# Patient Record
Sex: Female | Born: 1940
Health system: Southern US, Community
[De-identification: ages and names within clinical notes are randomized; demographics above are authoritative.]

## PROBLEM LIST (undated history)

## (undated) DIAGNOSIS — E039 Hypothyroidism, unspecified: Secondary | ICD-10-CM

## (undated) DIAGNOSIS — M1990 Unspecified osteoarthritis, unspecified site: Secondary | ICD-10-CM

## (undated) DIAGNOSIS — I2510 Atherosclerotic heart disease of native coronary artery without angina pectoris: Secondary | ICD-10-CM

## (undated) DIAGNOSIS — E785 Hyperlipidemia, unspecified: Secondary | ICD-10-CM

## (undated) DIAGNOSIS — E119 Type 2 diabetes mellitus without complications: Secondary | ICD-10-CM

## (undated) DIAGNOSIS — K5790 Diverticulosis of intestine, part unspecified, without perforation or abscess without bleeding: Secondary | ICD-10-CM

## (undated) DIAGNOSIS — F3289 Other specified depressive episodes: Secondary | ICD-10-CM

## (undated) DIAGNOSIS — I10 Essential (primary) hypertension: Secondary | ICD-10-CM

## (undated) DIAGNOSIS — I471 Supraventricular tachycardia, unspecified: Secondary | ICD-10-CM

## (undated) DIAGNOSIS — F329 Major depressive disorder, single episode, unspecified: Secondary | ICD-10-CM

## (undated) HISTORY — DX: Essential (primary) hypertension: I10

## (undated) HISTORY — DX: Hyperlipidemia, unspecified: E78.5

## (undated) HISTORY — DX: Other specified depressive episodes: F32.89

## (undated) HISTORY — DX: Major depressive disorder, single episode, unspecified: F32.9

## (undated) HISTORY — DX: Diverticulosis of intestine, part unspecified, without perforation or abscess without bleeding: K57.90

## (undated) HISTORY — DX: Hypothyroidism, unspecified: E03.9

---

## 1987-11-13 HISTORY — PX: ABDOMINAL HYSTERECTOMY: SHX81

## 2000-02-12 ENCOUNTER — Ambulatory Visit (HOSPITAL_COMMUNITY): Admission: RE | Admit: 2000-02-12 | Discharge: 2000-02-12 | Payer: Self-pay | Admitting: Family Medicine

## 2000-02-12 ENCOUNTER — Encounter: Payer: Self-pay | Admitting: Family Medicine

## 2001-07-10 ENCOUNTER — Emergency Department (HOSPITAL_COMMUNITY): Admission: EM | Admit: 2001-07-10 | Discharge: 2001-07-10 | Payer: Self-pay | Admitting: Internal Medicine

## 2001-11-12 HISTORY — PX: COLONOSCOPY: SHX174

## 2002-04-24 ENCOUNTER — Ambulatory Visit (HOSPITAL_COMMUNITY): Admission: RE | Admit: 2002-04-24 | Discharge: 2002-04-24 | Payer: Self-pay | Admitting: *Deleted

## 2004-09-18 ENCOUNTER — Ambulatory Visit: Payer: Self-pay | Admitting: Cardiology

## 2004-09-20 ENCOUNTER — Ambulatory Visit: Payer: Self-pay | Admitting: Internal Medicine

## 2005-06-12 HISTORY — PX: TOTAL KNEE ARTHROPLASTY: SHX125

## 2005-06-27 ENCOUNTER — Inpatient Hospital Stay (HOSPITAL_COMMUNITY): Admission: RE | Admit: 2005-06-27 | Discharge: 2005-07-02 | Payer: Self-pay | Admitting: Orthopedic Surgery

## 2005-07-17 ENCOUNTER — Ambulatory Visit: Payer: Self-pay | Admitting: Internal Medicine

## 2005-10-18 ENCOUNTER — Ambulatory Visit: Payer: Self-pay | Admitting: Internal Medicine

## 2005-10-26 ENCOUNTER — Ambulatory Visit: Payer: Self-pay | Admitting: Internal Medicine

## 2005-11-23 ENCOUNTER — Encounter: Admission: RE | Admit: 2005-11-23 | Discharge: 2006-02-21 | Payer: Self-pay | Admitting: Internal Medicine

## 2006-09-20 ENCOUNTER — Ambulatory Visit: Payer: Self-pay | Admitting: Internal Medicine

## 2006-09-20 LAB — CONVERTED CEMR LAB
ALT: 22 units/L (ref 0–40)
AST: 22 units/L (ref 0–37)
BUN: 9 mg/dL (ref 6–23)
Chol/HDL Ratio, serum: 8.4
Cholesterol: 235 mg/dL (ref 0–200)
Creatinine, Ser: 0.8 mg/dL (ref 0.4–1.2)
Creatinine,U: 122.3 mg/dL
Free T4: 0.7 ng/dL — ABNORMAL LOW (ref 0.9–1.8)
HDL: 28 mg/dL — ABNORMAL LOW (ref >39.0)
Hgb A1c MFr Bld: 9.1 % — ABNORMAL HIGH (ref 4.6–6.0)
LDL DIRECT: 165.5 mg/dL
Microalb Creat Ratio: 4.9 mg/g (ref 0.0–30.0)
Microalb, Ur: 0.6 mg/dL (ref 0.0–1.9)
Potassium: 3.9 meq/L (ref 3.5–5.1)
TSH: 3.83 microintl units/mL (ref 0.35–5.50)
Triglyceride fasting, serum: 224 mg/dL (ref 0–149)
VLDL: 45 mg/dL — ABNORMAL HIGH (ref 0–40)

## 2006-09-27 ENCOUNTER — Ambulatory Visit: Payer: Self-pay | Admitting: Internal Medicine

## 2007-04-08 ENCOUNTER — Encounter: Payer: Self-pay | Admitting: Internal Medicine

## 2007-10-08 ENCOUNTER — Ambulatory Visit: Payer: Self-pay | Admitting: Internal Medicine

## 2007-10-10 ENCOUNTER — Ambulatory Visit: Payer: Self-pay | Admitting: Internal Medicine

## 2007-10-12 LAB — CONVERTED CEMR LAB
ALT: 34 units/L (ref 0–35)
AST: 34 units/L (ref 0–37)
BUN: 9 mg/dL (ref 6–23)
Creatinine, Ser: 0.7 mg/dL (ref 0.4–1.2)
Creatinine,U: 78.3 mg/dL
Hgb A1c MFr Bld: 8.3 % — ABNORMAL HIGH (ref 4.6–6.0)
Microalb Creat Ratio: 14 mg/g (ref 0.0–30.0)
Microalb, Ur: 1.1 mg/dL (ref 0.0–1.9)
Potassium: 4.7 meq/L (ref 3.5–5.1)

## 2007-10-13 ENCOUNTER — Encounter (INDEPENDENT_AMBULATORY_CARE_PROVIDER_SITE_OTHER): Payer: Self-pay | Admitting: *Deleted

## 2007-10-20 ENCOUNTER — Encounter (INDEPENDENT_AMBULATORY_CARE_PROVIDER_SITE_OTHER): Payer: Self-pay | Admitting: *Deleted

## 2007-10-24 ENCOUNTER — Ambulatory Visit: Payer: Self-pay | Admitting: Internal Medicine

## 2007-10-24 DIAGNOSIS — F3289 Other specified depressive episodes: Secondary | ICD-10-CM | POA: Insufficient documentation

## 2007-10-24 DIAGNOSIS — E039 Hypothyroidism, unspecified: Secondary | ICD-10-CM | POA: Insufficient documentation

## 2007-10-24 DIAGNOSIS — F329 Major depressive disorder, single episode, unspecified: Secondary | ICD-10-CM

## 2007-10-24 LAB — CONVERTED CEMR LAB
Cholesterol, target level: 200 mg/dL
HDL goal, serum: 40 mg/dL
LDL Goal: 100 mg/dL

## 2008-01-02 ENCOUNTER — Ambulatory Visit: Payer: Self-pay | Admitting: Internal Medicine

## 2008-01-02 LAB — CONVERTED CEMR LAB
AST: 21 units/L (ref 0–37)
Bilirubin, Direct: 0.1 mg/dL (ref 0.0–0.3)
CO2: 33 meq/L — ABNORMAL HIGH (ref 19–32)
Chloride: 109 meq/L (ref 96–112)
Cholesterol: 156 mg/dL (ref 0–200)
Creatinine, Ser: 0.8 mg/dL (ref 0.4–1.2)
Glucose, Bld: 124 mg/dL — ABNORMAL HIGH (ref 70–99)
HDL: 37.1 mg/dL — ABNORMAL LOW (ref >39.0)
Hgb A1c MFr Bld: 7.3 % — ABNORMAL HIGH (ref 4.6–6.0)
LDL Cholesterol: 90 mg/dL (ref 0–99)
Sodium: 146 meq/L — ABNORMAL HIGH (ref 135–145)
Total Bilirubin: 0.7 mg/dL (ref 0.3–1.2)
Total Protein: 6.8 g/dL (ref 6.0–8.3)

## 2008-01-09 ENCOUNTER — Ambulatory Visit: Payer: Self-pay | Admitting: Internal Medicine

## 2008-01-09 DIAGNOSIS — E785 Hyperlipidemia, unspecified: Secondary | ICD-10-CM | POA: Insufficient documentation

## 2008-01-09 DIAGNOSIS — I10 Essential (primary) hypertension: Secondary | ICD-10-CM | POA: Insufficient documentation

## 2008-05-07 ENCOUNTER — Ambulatory Visit: Payer: Self-pay | Admitting: Internal Medicine

## 2008-05-17 ENCOUNTER — Encounter (INDEPENDENT_AMBULATORY_CARE_PROVIDER_SITE_OTHER): Payer: Self-pay | Admitting: *Deleted

## 2008-05-17 LAB — CONVERTED CEMR LAB
Creatinine,U: 126 mg/dL
Hgb A1c MFr Bld: 8 % — ABNORMAL HIGH (ref 4.6–6.0)
Microalb, Ur: 0.2 mg/dL (ref 0.0–1.9)
Potassium: 4.3 meq/L (ref 3.5–5.1)
Total CHOL/HDL Ratio: 6.9

## 2008-05-28 ENCOUNTER — Telehealth: Payer: Self-pay | Admitting: Internal Medicine

## 2008-05-28 ENCOUNTER — Ambulatory Visit: Payer: Self-pay | Admitting: Internal Medicine

## 2008-06-02 ENCOUNTER — Encounter: Payer: Self-pay | Admitting: Internal Medicine

## 2008-06-07 ENCOUNTER — Encounter (INDEPENDENT_AMBULATORY_CARE_PROVIDER_SITE_OTHER): Payer: Self-pay | Admitting: *Deleted

## 2008-06-18 ENCOUNTER — Encounter: Payer: Self-pay | Admitting: Internal Medicine

## 2008-08-05 ENCOUNTER — Telehealth (INDEPENDENT_AMBULATORY_CARE_PROVIDER_SITE_OTHER): Payer: Self-pay | Admitting: *Deleted

## 2008-08-13 ENCOUNTER — Ambulatory Visit: Payer: Self-pay | Admitting: Internal Medicine

## 2008-08-13 LAB — CONVERTED CEMR LAB
Albumin: 3.5 g/dL (ref 3.5–5.2)
Alkaline Phosphatase: 60 units/L (ref 39–117)
Bilirubin, Direct: 0.1 mg/dL (ref 0.0–0.3)
Cholesterol: 247 mg/dL (ref 0–200)
Direct LDL: 144.5 mg/dL
HDL: 32.8 mg/dL — ABNORMAL LOW (ref >39.0)
TSH: 3.59 microintl units/mL (ref 0.35–5.50)
Total CHOL/HDL Ratio: 7.5
VLDL: 64 mg/dL — ABNORMAL HIGH (ref 0–40)

## 2008-08-20 ENCOUNTER — Ambulatory Visit: Payer: Self-pay | Admitting: Internal Medicine

## 2008-08-20 DIAGNOSIS — J209 Acute bronchitis, unspecified: Secondary | ICD-10-CM | POA: Insufficient documentation

## 2008-09-07 ENCOUNTER — Encounter: Payer: Self-pay | Admitting: Internal Medicine

## 2008-09-18 ENCOUNTER — Emergency Department (HOSPITAL_BASED_OUTPATIENT_CLINIC_OR_DEPARTMENT_OTHER): Admission: EM | Admit: 2008-09-18 | Discharge: 2008-09-18 | Payer: Self-pay | Admitting: Emergency Medicine

## 2008-10-15 ENCOUNTER — Encounter: Payer: Self-pay | Admitting: Internal Medicine

## 2010-10-10 ENCOUNTER — Ambulatory Visit: Payer: Self-pay | Admitting: Internal Medicine

## 2010-10-10 LAB — CONVERTED CEMR LAB
Glucose, Urine, Semiquant: NEGATIVE
Nitrite: POSITIVE
Specific Gravity, Urine: <1.005
pH: 6.5

## 2010-10-11 ENCOUNTER — Ambulatory Visit: Payer: Self-pay | Admitting: Internal Medicine

## 2010-10-11 DIAGNOSIS — E119 Type 2 diabetes mellitus without complications: Secondary | ICD-10-CM | POA: Insufficient documentation

## 2010-10-11 DIAGNOSIS — N390 Urinary tract infection, site not specified: Secondary | ICD-10-CM | POA: Insufficient documentation

## 2010-12-12 NOTE — Assessment & Plan Note (Signed)
Summary: UTI/KN   Vital Signs:  Patient profile:   70 year old female Height:      64 inches (162.56 cm) Weight:      199.25 pounds (90.57 kg) BMI:     34.32 Temp:     98.4 degrees F (36.89 degrees C) oral Resp:     16 per minute BP sitting:   130 / 70  (left arm) Cuff size:   large  Vitals Entered By: Lucious Groves CMA (October 11, 2010 12:05 PM) CC: UTI./kb, Dysuria Is Patient Diabetic? Yes Pain Assessment Patient in pain? no      Comments Patient notes that she has been having fever, back pain, blood in urine, dysuria, burning, increased frequency and increased urgency.   CC:  UTI./kb and Dysuria.  History of Present Illness: Dysuria      This is a 70 year old woman who presents with Dysuria ; onset as pressure  11/25.  The patient reports burning with urination, urinary frequency, urgency, and hematuria.  Associated symptoms include fever to 101 on 11/28  and R  flank pain.  The patient denies the following associated symptoms: nausea, vomiting, shaking chills, and abdominal pain.  Risk factors for urinary tract infection include diabetes.  Pre illness , A1c 6-7 %; fasting glucoses 69- 130.She is on Lantus 50-60 units once daily & Metformin.  Current Medications (verified): 1)  Glimepiride 4 Mg  Tabs (Glimepiride) .... Take One Half  Tablet Two Times A Day With Meals 2)  Centrum Silver .... Once Daily 3)  Asa 81mg  .... Once Daily 4)  Losartan Potassium 100 Mg Tabs (Losartan Potassium) .Marland Kitchen.. 1 By Mouth Qd 5)  Levothyroxine Sodium 50 Mcg Tabs (Levothyroxine Sodium) .Marland Kitchen.. 1 By Mouth Qd 6)  Metformin Hcl 500 Mg Tabs (Metformin Hcl) .Marland Kitchen.. 1 By Mouth Two Times A Day  Allergies (verified): 1)  ! Pcn  Physical Exam  General:  well-nourished,in no acute distress; alert,appropriate and cooperative throughout examination Lungs:  Normal respiratory effort, chest expands symmetrically. Lungs are clear to auscultation, no crackles or wheezes. Heart:  Normal rate and regular rhythm. S1  and S2 normal without gallop, murmur, click, rub . S4 Abdomen:  Bowel sounds positive but slightly decreased,abdomen soft and non-tender without masses, organomegaly or hernias noted. Pulses:  R and L carotid,radial,dorsalis pedis and posterior tibial pulses are full and equal bilaterally Extremities:  No clubbing, cyanosis, edema. Neg SLR bilaterally Neurologic:  alert & oriented X3.   Skin:  Intact without suspicious lesions or rashes. Skin slightly damp Cervical Nodes:  No lymphadenopathy noted Axillary Nodes:  No palpable lymphadenopathy Psych:  memory intact for recent and remote, normally interactive, and good eye contact.     Impression & Recommendations:  Problem # 1:  UTI (ICD-599.0)  The following medications were removed from the medication list:    Azithromycin 250 Mg Tabs (Azithromycin) .Marland Kitchen... As per pack Her updated medication list for this problem includes:    Ciprofloxacin Hcl 500 Mg Tabs (Ciprofloxacin hcl) .Marland Kitchen... 1 two times a day ( hold glimiperide while on this)  Orders: Prescription Created Electronically [phone])  Problem # 2:  DIABETES MELLITUS, TYPE II, CONTROLLED (ICD-250.00) as per Dr Talmage Nap The following medications were removed from the medication list:    Actoplus Met 15-850 Mg Tabs (Pioglitazone hcl-metformin hcl) .Marland Kitchen... Take one tablet with two largest meals of the day.    Diovan 160 Mg Tabs (Valsartan) .Marland Kitchen... 1 by mouth once daily Her updated medication list for this problem  includes:    Glimepiride 4 Mg Tabs (Glimepiride) .Marland Kitchen... Take one half  tablet two times a day with meals    Losartan Potassium 100 Mg Tabs (Losartan potassium) .Marland Kitchen... 1 by mouth qd    Metformin Hcl 500 Mg Tabs (Metformin hcl) .Marland Kitchen... 1 by mouth two times a day  Complete Medication List: 1)  Glimepiride 4 Mg Tabs (Glimepiride) .... Take one half  tablet two times a day with meals 2)  Centrum Silver  .... Once daily 3)  Asa 81mg   .... Once daily 4)  Losartan Potassium 100 Mg Tabs (Losartan  potassium) .Marland Kitchen.. 1 by mouth qd 5)  Levothyroxine Sodium 50 Mcg Tabs (Levothyroxine sodium) .Marland Kitchen.. 1 by mouth qd 6)  Metformin Hcl 500 Mg Tabs (Metformin hcl) .Marland Kitchen.. 1 by mouth two times a day 7)  Ciprofloxacin Hcl 500 Mg Tabs (Ciprofloxacin hcl) .Marland Kitchen.. 1 two times a day ( hold glimiperide while on this)  Patient Instructions: 1)  Drink as much NON dairy fluid as you can tolerate for the next few days. Monitor FBS & 2 hrs after largest meal until well. 2)  Take 650-1000mg  of Tylenol every 4-6 hours as needed for relief of pain or comfort of fever AVOID taking more than 4000mg   in a 24 hour period (can cause liver damage in higher doses) OR take 400-600mg  of Ibuprofen (Advil, Motrin) with food every 4-6 hours as needed for relief of pain or comfort of fever. Prescriptions: CIPROFLOXACIN HCL 500 MG TABS (CIPROFLOXACIN HCL) 1 two times a day ( hold Glimiperide while on this)  #14 x 0   Entered and Authorized by:   Marga Melnick MD   Signed by:   Marga Melnick MD on 10/11/2010   Method used:   Faxed to ...       [facility] [address] # [phone]* (retail)       [address] [place], [place]  [address]       Ph: [phone]       Fax: [phone]   RxID:   [id number]    Orders Added: 1)  Est. Patient Level III [24401] 2)  Prescription Created Electronically [phone]

## 2011-03-30 NOTE — Op Note (Signed)
Danielle Murray, Danielle Murray               ACCOUNT NO.:  [id number]   MEDICAL RECORD NO.:  [id number]          PATIENT TYPE:  INP   LOCATION:  X004                         FACILITY:  [facility]   PHYSICIAN:  Georges Lynch. Gioffre, M.D.DATE OF BIRTH:  [birth year]   DATE OF PROCEDURE:  06/27/2005  DATE OF DISCHARGE:                                 OPERATIVE REPORT   SURGEON:  Georges Lynch. Darrelyn Hillock, M.D.   ASSISTANT:  Ebbie Ridge. Paitsel, P.A.   PREOPERATIVE DIAGNOSIS:  Severe degenerative arthritis left knee with a genu  varus.   POSTOPERATIVE DIAGNOSIS:  Severe degenerative arthritis left knee with a  genu varus.   OPERATION:  Left total knee arthroplasty utilizing the DePuy system. I  utilized the Air cabin crew. The sizes used were a size 32 patella  with three pegs, a size 2 tibial tray with a 12.5 mm thickness insert and a  size 2.5 left femoral component, posterior cruciate sacrificing type. We did  utilize a rotating platform tibial component. We cemented all three  components in and I used vancomycin in the cement.   DESCRIPTION OF PROCEDURE:  Under general anesthesia, routine orthopedic prep  and draping of the left lower extremity was carried out. The patient had 1  gram of vancomycin IV. After a sterile prep and draping of the left lower  extremity carried out, I then went on and exsanguinated the leg with the  Esmarch. The leg was exsanguinated and esmarched, tourniquet was elevated to  375 mmHg. An anterior approach to the left knee was carried out, bleeders  identified and cauterized. Self-retaining retractors were inserted. I then  carried out a median parapatellar incision, I reflected the patella  laterally, flexed the knee and did lateral medial meniscectomies and excised  the anterior and posterior cruciate ligaments. I freed up the medial  structures of the knee because of the severe genu varus. We then went on and  removed the spurs from the patella, tibia and femur. An  initial drill hole  was made in the intercondylar notch, the intramedullary guide rod was  inserted and we measured and removed 10 mm thickness off the distal femur at  5 degree valgus angle. A #2 jig was inserted for a size 2.5 femur after we  did the appropriate measurements and we carried out our anterior, posterior  and chamfering cuts in the usual fashion. Following that, we then prepared  our tibia in the usual fashion. We removed 6 mm thickness off the affected  medial side of the tibia. Once the tibia was cut, we then continued our  preparation of the tibia for a size two tibial tray. We cut our keel cut in  the usual fashion. We then went on and prepared our notch cut from the femur  in the usual fashion. Following this, we inserted our trial components. We  first tried a 10 mm thickness tibial insert, it was too loose. We then tried  a 12.5 mm thickness tibial insert and we had excellent flexion, extension  and good medial lateral stability. We did utilize the spacer block first to  measure our tension. Following that, we went on and prepared our patella. We  removed the appropriate amount of bone from the articular surface of patella  by carrying out a resurfacing type patella, three drill holes were made for  a 32 mm patella. After this was done, we then thoroughly water picked out  the knee after we removed the trial components, dried the knee out and  cemented all three components in simultaneously. Vancomycin was used in the  cement. Following this, we then made sure that we had no loose pieces of  cement. We thoroughly removed all loose pieces of cement. We then inserted  our permanent 12.5 thickness size  two tibial rotating platform tray, took the knee through motion and had  excellent stability. I carried out a partial lateral release at the same  time. I then inserted the hemovac drain and closed the wound in layers in  the usual fashion. The surgeon was Dr. Darrelyn Hillock,  assistant Terie Purser, P.A.           ______________________________  Georges Lynch. Darrelyn Hillock, M.D.     RAG/MEDQ  D:  06/27/2005  T:  06/28/2005  Job:  [id number]

## 2011-03-30 NOTE — H&P (Signed)
NAME:  Danielle Murray, Danielle Murray               ACCOUNT NO.:  [id number]   MEDICAL RECORD NO.:  [id number]          PATIENT TYPE:  INP   LOCATION:  NA                           FACILITY:  [facility]   PHYSICIAN:  Georges Lynch. Gioffre, M.D.DATE OF BIRTH:  Jul 15, 1941   DATE OF ADMISSION:  DATE OF DISCHARGE:                                HISTORY & PHYSICAL   HISTORY:  The patient has had left knee pain for several months, has gotten  worse over the past few weeks.  He has had increasing pain with ambulation.  It is difficult for her to climb stairs.  She is not comfortable taking  nonsteroidal anti-inflammatory medications because her mother had a problem  with heart failure while taking Vioxx.  She knows she has arthritis in her  knees, and she is ready to proceed with a total knee arthroplasty and will  be admitted to hospital for same.   ALLERGIES:  PENICILLIN causes swelling.   PRIMARY CARE PHYSICIAN:  Dr. Alwyn Ren.   PAST MEDICAL HISTORY:  1.  Anxiety.  2.  Depression.  3.  Pneumonia in 1983.  4.  History of previous UTI.  5.  Hypertension.  6.  Diverticulitis.  7.  Noninsulin-dependent diabetes.  8.  Degenerative arthritis.   CURRENT MEDICATIONS:  1.  Aspirin 81 mg daily.  2.  Amaryl 4 mg daily.  3.  Micardis HCT 80/12.5 mg daily.   PREVIOUS SURGICAL HISTORY:  The patient has had hysterectomy 1989.   FAMILY HISTORY:  Mother, sister, and brother heart disease, hypertension.  Mother, father, brother, sister with diabetes.  Father with a stroke.  Mother with arthritis.   REVIEW OF SYSTEMS:  GENERAL:  Denies weight change, fever, chills, fatigue.  HEENT:  Denies headache, visual changes, tinnitus, hearing loss, sore  throat.  CARDIOVASCULAR:  Denies chest pain, palpitations, shortness of  breath, orthopnea.  PULMONARY:  Denies dyspnea, wheezing, cough, sputum  production, hemoptysis.  GI:  Denies nausea, vomiting, hematemesis, or  abdominal pain.  GU:  Denies dysuria, frequency, urgency,  hematuria.  ENDOCRINE:  Denies polyuria, polydipsia, appetite change, heat or cold  intolerance.  MUSCULOSKELETAL:  The patient has bilateral knee pain.  NEUROLOGIC:  Denies dizziness, vertigo, syncope, seizure.  SKIN:  Denies  itching rashes, masses, or moles.   PHYSICAL EXAMINATION:  VITAL SIGNS:  Temperature is 98.7, pulse is 72,  respirations 16, blood pressure 122/70.  GENERAL:  A 70 year old female in no acute distress.  HEENT:  PERRL.  EOM's intact.  Throat is clear.  NECK:  Supple.  CHEST:  Clear to auscultation bilaterally.  No wheezing, rales, or rhonchi  noted.  HEART:  Regular rate and rhythm without murmur.  ABDOMEN:  Positive bowel sounds, soft, nontender.  EXAMINATION OF LEFT KNEE:  She flexes to about 90 degrees.  She has about 5  degree extension lag.  She has moderate knee effusion with genu varus  deformity.  SKIN:  Warm and dry.   X-ray of her left knee shows complete collapse of the medial joint.   IMPRESSION:  Degenerative osteoarthritis, left knee.   PLAN:  The patient is to be admitted to [facility], June 27, [facility], to undergo a left total knee arthroplasty.      Ebbie Ridge. Paitsel, P.A.    ______________________________  Georges Lynch Darrelyn Hillock, M.D.    Tilden Dome  D:  06/25/2005  T:  06/25/2005  Job:  [id number]

## 2011-03-30 NOTE — Discharge Summary (Signed)
NAMETAGEN, BRETHAUER               ACCOUNT NO.:  [id number]   MEDICAL RECORD NO.:  [id number]          PATIENT TYPE:  INP   LOCATION:  1510                         FACILITY:  [facility]   PHYSICIAN:  Georges Lynch. Gioffre, M.D.DATE OF BIRTH:  27-Nov-1940   DATE OF ADMISSION:  06/27/2005  DATE OF DISCHARGE:  07/02/2005                                 DISCHARGE SUMMARY   ADMISSION DIAGNOSIS:  Degenerative arthritis of the left knee.   DISCHARGE DIAGNOSIS:  Degenerative arthritis of the left knee.   REASON FOR ADMISSION:  Admitted for a total knee arthroplasty.   HOSPITAL COURSE:  She had severe arthritis involving the left knee.  She was  treated conservatively in the office and finally decided to have a left  total knee.  She was taken to surgery on June 27, 2005, and had a left  total knee arthroplasty utilizing the DePuy system.  I utilized the rotating  platform.  All three components were cemented and vancomycin was used in the  cement.   Postop, she did well.  As far as her hospital course she did well.  She was  started on the knee machine 1 day postop.  She was also maintained on  Coumadin.  On June 28, 2005, her Hemovac was discontinued and hemoglobin  was 11.9.  She moved her foot well and there was no problem as far as any  complications neurovascular wise.   On June 29, 2005, hemoglobin was 11.0.  Her dressing was changed and the  wound looked good.  She was up ambulating full weightbearing with a walker  and therapy and doing well.  She did well during her hospital course.  On  July 02, 2005, the dressing was changed again and the wound looked fine  and it was elected to discharge her on July 02, 2005.   LABORATORY DATA AND X-RAY FINDINGS:  Last hemoglobin that I recorded was  11.0.  On admission her sodium was 139, potassium 4.3, chloride 100, glucose  141.  She is a diabetic.  Her BUN was 15, which is normal, creatinine 0.7,  which was normal.  Her white count on  admission was 6700, hemoglobin 14.2,  hematocrit 42, differential normal.  Her INR was maintained while in  hospital with appropriate levels by the pharmacist.  Her urinalysis was  basically negative.   Chest x-ray showed no active pulmonary disease.  Her knee x-ray postop  showed that prosthesis was in good alignment.   DISCHARGE MEDICATIONS:  1.  Coumadin 5 mg a day.  2.  Percocet 10/650 one every 4 hours p.r.n. pain.  3.  Robaxin 500 mg by mouth t.i.d. p.r.n. for pain.  4.  Amaryl 4 mg a day.  5.  Micardis HCT 80/12.5 mg a day.   WOUND CARE:  She will change her dressing on a daily basis.   SPECIAL INSTRUCTIONS:  She will have home therapy as well as having her INR  monitored since she will be on Coumadin x4 weeks.   ACTIVITY:  She will be full weightbearing with a walker.   FOLLOW UP:  She will be seen in the office in about 2 weeks from the date of  surgery.           ______________________________  Georges Lynch Darrelyn Hillock, M.D.     RAG/MEDQ  D:  07/19/2005  T:  07/19/2005  Job:  [id number]

## 2011-03-30 NOTE — Procedures (Signed)
[facility]  Patient:    Danielle Murray, Danielle Murray Visit Number: [id number] MRN: [id number]          Service Type: END Location: ENDO Attending Physician:  Sabino Gasser Dictated by:   Sabino Gasser, M.D. Proc. Date: 04/24/02 Admit Date:  04/24/2002   CC:         Tinnie Gens C. Quintella Reichert, M.D.   Procedure Report  PROCEDURE:  Colonoscopy.  INDICATION:  Rectal bleeding.  PREP:  Visicol tablets.  ANESTHESIA:  Demerol 80, Versed 6 mg.  DESCRIPTION OF PROCEDURE:  With the patient mildly sedated in the left lateral decubitus position, the Olympus videoscopic colonoscope was inserted into the rectum and passed under direct vision to the cecum, identified by the ileocecal valve and appendiceal orifice.  From this point, the colonoscope was slowly withdrawn, taking circumferential views of the entire colonic mucosa, stopping in the sigmoid colon to photograph a moderate amount of diverticulosis until we reached rectum which appeared normal on direct and retroflexed view.  The endoscope was straightened and withdrawn through the anal canal.  The patient had large external hemorrhoids on rectal exam.  The patients vital signs and pulse oximeter remained stable.  The patient tolerated the procedure well without apparent complications.  FINDINGS: 1. External hemorrhoids, fairly large, which is probably the cause of the    patients bleeding. 2. Diverticulosis of the sigmoid colon. 3. Otherwise an unremarkable examination.  PLAN:  Follow up with Dr. Quintella Reichert for medical care and with me as needed. Dictated by:   Sabino Gasser, M.D. Attending Physician:  Sabino Gasser DD:  04/24/02 TD:  04/25/02 Job: [id number]

## 2012-01-15 ENCOUNTER — Encounter: Payer: Self-pay | Admitting: Dietician

## 2012-01-15 ENCOUNTER — Encounter: Payer: Medicare Other | Attending: Endocrinology | Admitting: Dietician

## 2012-01-15 VITALS — Ht 64.0 in | Wt 190.6 lb

## 2012-01-15 DIAGNOSIS — E119 Type 2 diabetes mellitus without complications: Secondary | ICD-10-CM | POA: Insufficient documentation

## 2012-01-15 DIAGNOSIS — Z713 Dietary counseling and surveillance: Secondary | ICD-10-CM | POA: Insufficient documentation

## 2012-01-15 NOTE — Progress Notes (Signed)
  Medical Nutrition Therapy:  Appt start time: 1000 end time:  1100.  Assessment:  Primary concerns today: Getting better control of blood glucose and to loose some weight.  Has stopped loosing at 190 lb.  History of type 2 diabetes for 2-3 years.  [facility] RD [facility] on [place].  Has been working on weight loss over the last few months.  Her A1C usually is 7-8.0%.  This last time it had increased to 9.1%.  Her weight was at 210 lb and she has "stalled at 190 lbs."    MEDICATIONS: Diabetes medications include:Metformin, Lantus insulin, and Humalog insulin  DIETARY INTAKE:  24-hr recall:  B (7:30 AM): 1 egg, canadian bacon and 1 whole wheat toast, Or PB or cheese slice on the1  whole grain bread, OR fruit smoothie (yogurt, greek smoothie from [employer] add 2% 1/2 cup of milk) Or cereal with 2% milk  OR vitamuffin top (various flavors) small v8 juice Snk (mid AM) :80 calorie yogurt  L (1:00 PM): Salad greens, tomato onion, cuke, olives celery and dressing +/- cheese on sandwich, OR Spinach salad with tomato and feta cheese.or sandwich ([place] or pimento cheese)) diet coke or water or tea with Splenda Snk (mid PM): Jomel Whittlesey be an apple split with spouse or handful or yogurt raisins, not every day. D (6-6:30 PM): trukey burger, 1/2 cup rice with vegetables (peas and carrots)  Coffee with 2% milk Snk (9:00 PM): Sugar free banana bread.  And coffee. And maybe 4 PR crackers. Beverages: Tea with Splenda, water, coffee with cream, V8 juice.    Recent physical activity: during winter months, no planned exercise, Ophia Shamoon be some stretches in the recliner and in spring/summe/fall will walk outside.  Estimated energy needs: HT:64 in  WT: 190.6  BMI: 32.8%  ADJ WT: 143 lb (65 kg) 1300-1400 calories 140-145 g carbohydrates 95-100 g protein 34-36 g fat  Progress Towards Goal(s):  In progress.   Nutritional Diagnosis:  [place]-2.1 Inpaired nutrition utilization As related  to glucosse.  As evidenced by diagnosis of type 2 diabetes, current A1C at 9.1%.    Intervention:  Nutrition Breakfast carb and calorie counts can vary greatly.  The high protein egg, canadian bacon, with 1 toast can be 250-300 calories with 15 gm of carb to the muffin top at 300+ calories and 39-40 gm of carb to the smoothie at 240-300 calories and 40 gm of carb.  Lunch 200 calories and 20-25 gm carb to the sandwich at 300 calories and 30-45 gm CHO.  Will benefit with using the exchange list and clearly identifying carb containing foods and also using the food label to count carbohydrates.  She is not always taking her insulin and with carb counting, she can more accurately dose her insulin, control her blood glucose and maybe loose some weight.  Handouts given during visit include:  Living Well with Diabetes  Yellow card with carb exchanges and diet prescription for 1200-1300 calories and approximately 145 gm Cho.  Carbohydrate Counting Guide by [employer]  Guide/record for counting carbs and calories  Monitoring/Evaluation:  Dietary intake, exercise, blood glucose levels, and body weight in 8-12 weeks if her insurance will allow a visit.

## 2012-01-27 ENCOUNTER — Encounter: Payer: Self-pay | Admitting: Dietician

## 2012-01-27 NOTE — Patient Instructions (Signed)
   Begin to practice measuring your portions at home.  Read food labels and begin to count the number of carbs in each serving.  This knowledge will assist you with later learning to dose your insulin.  Dr. Talmage Nap can give you a prescription for the units of insulin per grams of carbohydrate and this makes for more stable glucose levels.  Keep a record of the foods that you eat, the amount and the grams of carb in each serving.  Use food labels the carb counting guide and the web site "calorieking.com"    The calorie king folks also publish a small carb/calorie counting book.  A lady in class last week preferred the book to the web site.  Look to get back to regular exercise, especially in the water.  Less stress on the joints.  Explore if there are options at the [facility].  Aim to walk in the water 3-4 times per week for 30-40 minutes.  Continue with your medications.  Should you start to exercise and decrease your carbs and start to have low blood glucose levels, you need to contact Dr. Talmage Nap for a medication change.    In the evening, aim for a bedtime blood glucose of at least 100-120 mg/dl.  I enjoyed working with you on March 5th.  If you have any questions, please feel free to call or e-mail me.  Maggie Stein [place]st

## 2012-04-04 ENCOUNTER — Ambulatory Visit (INDEPENDENT_AMBULATORY_CARE_PROVIDER_SITE_OTHER): Payer: Medicare Other | Admitting: Family Medicine

## 2012-04-04 ENCOUNTER — Telehealth: Payer: Self-pay | Admitting: Internal Medicine

## 2012-04-04 ENCOUNTER — Ambulatory Visit: Payer: Medicare Other

## 2012-04-04 DIAGNOSIS — R079 Chest pain, unspecified: Secondary | ICD-10-CM

## 2012-04-04 MED ORDER — HYDROCODONE-ACETAMINOPHEN 5-500 MG PO TABS: 1.0000 | ORAL_TABLET | Freq: Three times a day (TID) | ORAL | Status: AC | PRN

## 2012-04-04 NOTE — Telephone Encounter (Signed)
Please verify primary care MD; remove my name if incorrect.No recent record in EMR . Seeing Dr Talmage Nap  For DM.Thanks.

## 2012-04-04 NOTE — Telephone Encounter (Signed)
Per Emergent Line: Caller: Danielle Murray/Patient; PCP: Marga Melnick; CB#: [phone]. Call regarding Chest Pain/Chest Discomfort. Caller reports a dull aching/nagging pain in her chest that is worse when she bends over or coughs. Pain began 2 days ago, Wed 5/22. Caller reports she had a chest cold that resolved 4-5 days ago. Caller reports getting weak and clammy at the grocery store yesterday. Per Chest Pain Protocol, See in 4 hrs Protocol, no open appts. RN spoke with Judeth Cornfield. Advised to send her to UC for eval of sxs. Caller advised and is agreeable.

## 2012-04-04 NOTE — Progress Notes (Signed)
Patient Name: Danielle Murray Date of Birth: 04-26-1941 Medical Record Number: [id number] Gender: female Date of Encounter: 04/04/2012  History of Present Illness:  Danielle Murray is a 71 y.o. very pleasant female patient who presents with the following:  She noted pain under the right breast for the last 2 days. Pain is constant, but worse if she twists or takes a deep breath/ coughs.  She did have an illness with fever to 101 and chills last week.  These symptoms have been better for several days.  She has a mild dry cough.  No change with eating.  Hard to get comfortable in bed at night.  She does not note SOB.   PCP is Dr. Marga Melnick, Dr. Talmage Nap for endocrine.  60 units of insulin.   She has no personal history of CAD.  Her mother had CHF, her twin sister and father have had MI.    Patient Active Problem List  Diagnoses  . HYPOTHYROIDISM  . DIABETES MELLITUS, TYPE II, CONTROLLED  . OTHER AND UNSPECIFIED HYPERLIPIDEMIA  . DEPRESSION  . UNSPECIFIED ESSENTIAL HYPERTENSION  . ACUTE BRONCHITIS  . UTI   Past Medical History  Diagnosis Date  . Hypertension   . Hyperlipidemia   . Diabetes mellitus   . Obesity    Past Surgical History  Procedure Date  . Joint replacement    History  Substance Use Topics  . Smoking status: Never Smoker   . Smokeless tobacco: Never Used  . Alcohol Use: No   Family History  Problem Relation Age of Onset  . Asthma Mother   . Hypertension Mother   . Heart disease Mother   . Hypertension Father   . Stroke Father   . Heart attack Father   . Heart disease Father   . Hypertension Sister   . Hyperlipidemia Sister   . Heart disease Sister   . Hypertension Brother   . Hyperlipidemia Brother   . Heart disease Brother    Allergies  Allergen Reactions  . Penicillins     Medication list has been reviewed and updated.  Review of Systems: As per HPI- otherwise negative.   Physical Examination: Filed Vitals:   04/04/12 1505  BP:  142/70  Pulse: 70  Temp: 97.7 F (36.5 C)  TempSrc: Oral  Resp: 18  Height: 5\' 4"  (1.626 m)  Weight: 190 lb (86.183 kg)  SpO2: 97%    Body mass index is 32.61 kg/(m^2).  GEN: WDWN, NAD, Non-toxic, A & O x 3, obese HEENT: Atraumatic, Normocephalic. Neck supple. No masses, No LAD.  Tm and oropharynx wnl Ears and Nose: No external deformity. CV: RRR, No M/G/R. No JVD. No thrill. No extra heart sounds. Chest wall: very reprodicible right lateral chest wall pain.  No lesion, redness or swelling PULM: CTA B, no wheezes, crackles, rhonchi. No retractions. No resp. distress. No accessory muscle use. ABD: S, NT, ND, +BS. No rebound. No HSM. EXTR: No c/c/e NEURO Normal gait.  PSYCH: Normally interactive. Conversant. Not depressed or anxious appearing.  Calm demeanor.   [facility] reading (PRIMARY) by  Dr. Patsy Lager.  No rib fracture noted, normal chest   Assessment and Plan: 1. Chest pain  DG Ribs Unilateral W/Chest Right, DG Chest 2 View, HYDROcodone-acetaminophen (VICODIN) 5-500 MG per tablet   MSK CP.  Reassured that as her CP is easily reproducible that it is not likely to be anything to do with her heart or lungs.  She is sometimes quite uncomfortable with her pain,  so gave rx for a few vicodin to use- caution that these can be sedating.  Please let me know if not better in the next few days- Sooner if worse.

## 2012-04-05 ENCOUNTER — Telehealth: Payer: Self-pay | Admitting: *Deleted

## 2012-04-05 NOTE — Telephone Encounter (Signed)
Patient was called and a message was left telling her that her rib xray results were received and no fracture was seen.

## 2012-04-08 NOTE — Telephone Encounter (Signed)
She was last seen by you 10-11-2010 and has scheduled a CPX appointment for 07/2012.

## 2012-08-07 ENCOUNTER — Ambulatory Visit (INDEPENDENT_AMBULATORY_CARE_PROVIDER_SITE_OTHER): Payer: Medicare Other | Admitting: Internal Medicine

## 2012-08-07 ENCOUNTER — Encounter: Payer: Self-pay | Admitting: *Deleted

## 2012-08-07 ENCOUNTER — Encounter: Payer: Self-pay | Admitting: Internal Medicine

## 2012-08-07 VITALS — BP 118/70 | HR 73 | Temp 98.3°F | Resp 14 | Ht 65.0 in | Wt 194.4 lb

## 2012-08-07 DIAGNOSIS — K5790 Diverticulosis of intestine, part unspecified, without perforation or abscess without bleeding: Secondary | ICD-10-CM

## 2012-08-07 DIAGNOSIS — E1151 Type 2 diabetes mellitus with diabetic peripheral angiopathy without gangrene: Secondary | ICD-10-CM | POA: Insufficient documentation

## 2012-08-07 DIAGNOSIS — I10 Essential (primary) hypertension: Secondary | ICD-10-CM

## 2012-08-07 DIAGNOSIS — N959 Unspecified menopausal and perimenopausal disorder: Secondary | ICD-10-CM

## 2012-08-07 DIAGNOSIS — IMO0001 Reserved for inherently not codable concepts without codable children: Secondary | ICD-10-CM

## 2012-08-07 DIAGNOSIS — Z0000 Encounter for general adult medical examination without abnormal findings: Secondary | ICD-10-CM

## 2012-08-07 DIAGNOSIS — E785 Hyperlipidemia, unspecified: Secondary | ICD-10-CM

## 2012-08-07 DIAGNOSIS — IMO0002 Reserved for concepts with insufficient information to code with codable children: Secondary | ICD-10-CM | POA: Insufficient documentation

## 2012-08-07 DIAGNOSIS — Z23 Encounter for immunization: Secondary | ICD-10-CM

## 2012-08-07 DIAGNOSIS — K5730 Diverticulosis of large intestine without perforation or abscess without bleeding: Secondary | ICD-10-CM

## 2012-08-07 DIAGNOSIS — E1165 Type 2 diabetes mellitus with hyperglycemia: Secondary | ICD-10-CM | POA: Insufficient documentation

## 2012-08-07 LAB — CBC WITH DIFFERENTIAL/PLATELET
Eosinophils Relative: 1.5 % (ref 0.0–5.0)
HCT: 46.9 % — ABNORMAL HIGH (ref 36.0–46.0)
Lymphs Abs: 3 10*3/uL (ref 0.7–4.0)
MCV: 88.5 fl (ref 78.0–100.0)
Monocytes Absolute: 0.5 10*3/uL (ref 0.1–1.0)
Platelets: 323 10*3/uL (ref 150.0–400.0)
RDW: 13.8 % (ref 11.5–14.6)
WBC: 9.1 10*3/uL (ref 4.5–10.5)

## 2012-08-07 NOTE — Progress Notes (Signed)
Subjective:    Patient ID: Danielle Murray, female    DOB: 1941-08-24, 71 y.o.   MRN: [id number]  HPI Medicare Wellness Visit:  The following psychosocial & medical history were reviewed as required by Medicare.   Social history: caffeine: 2-3 cups coffee/ day , alcohol:  RARE wine ,  tobacco use : never  & exercise : walking 1-2 X/ day for 15 min.   Home & personal  safety / fall risk: some imbalance w/o falls, activities of daily living: no limitations , seatbelt use : yes , and smoke alarm employment : yes.  Power of Attorney/Living Will status : NO  Vision ( as recorded per Nurse) & Hearing  evaluation :  Ophth exam 2013, early catract.No hearing exam Orientation :oriented X 3 , memory & recall :good, spelling  testing: good,and mood & affect : normal . Depression / anxiety: denied Travel history : never , immunization status : Pneumonia today , transfusion history:  no, and preventive health surveillance ( colonoscopies, BMD , etc as per protocol/ [facility]):  colonoscopy < 10 years, Dental care:  every 12 mos . Chart reviewed &  Updated. Active issues reviewed & addressed.       Review of Systems HYPERTENSION: Disease Monitoring: Blood pressure range- 120-130/70-73  Chest pain, palpitations- no       Dyspnea- no Medications: Compliance- yes Lightheadedness,Syncope- no    Edema- no  DIABETES: Disease Monitoring:Dr Balan, Endocrinology. A1c 8.6% on 07/28/12 (average sugar 300; 72% risk increase) Blood Sugar ranges-"high" Polyuria/phagia/dipsia- no       Visual problems- no Medications: Compliance- yes  Hypoglycemic symptoms- rarely to 69  HYPERLIPIDEMIA: Disease Monitoring: See symptoms for Hypertension Medications: Compliance- Simvastatin started this week  Abd pain, bowel changes- no   Muscle aches- no but arthralgias in hips & knees @ night          Objective:   Physical Exam Gen.:  well-nourished in appearance. Alert, appropriate and cooperative throughout  exam. Head: Normocephalic without obvious abnormalities  Eyes: No corneal or conjunctival inflammation noted. Pupils equal round reactive to light and accommodation.  Extraocular motion intact. Vision grossly normal with lenses. Ears: External  ear exam reveals no significant lesions or deformities. Canals clear .TMs normal. Hearing is grossly normal bilaterally. Nose: External nasal exam reveals no deformity or inflammation. Nasal mucosa are pink and moist. No lesions or exudates noted.  Mouth: Oral mucosa and oropharynx reveal no lesions or exudates. Teeth in good repair. Neck: No deformities, masses, or tenderness noted. Range of motion & Thyroid normal. Lungs: Normal respiratory effort; chest expands symmetrically. Lungs are clear to auscultation without rales, wheezes, or increased work of breathing. Heart: Normal rate and rhythm. Normal S1 and S2. No gallop, click, or rub. S4 w/o murmur. Abdomen: Bowel sounds normal; abdomen soft and nontender. No masses, organomegaly or hernias noted. Genitalia: Gyn F/U due  Musculoskeletal/extremities: Lordosis noted of  the thoracic  spine. No clubbing, cyanosis, edema, or deformity noted. Range of motion decreased @ knees .Tone & strength  normal.Joints : minor isolated DIP changes. Nail health  good. Vascular: Carotid, radial artery, dorsalis pedis and  posterior tibial pulses are full and equal. No bruits present. Neurologic: Alert and oriented x3. Deep tendon reflexes symmetrical and normal.          Skin: Intact without suspicious lesions or rashes. Lymph: No cervical, axillary lymphadenopathy present. Psych: Mood and affect are normal. Normally interactive  Assessment & Plan:  #1 Medicare Wellness Exam; criteria met ; data entered #2 Problem List reviewed ; Assessment/ Recommendations made Plan: see Orders

## 2012-08-07 NOTE — Patient Instructions (Addendum)
Preventive Health Care: Exercise  30-45  minutes a day, 3-4 days a week. Walking is especially valuable in preventing Osteoporosis. The most common cause of elevated triglycerides is the ingestion of sugar from high fructose corn syrup sources added to processed foods & drinks.  Eat a low-fat diet with lots of fruits and vegetables, up to 7-9 servings per day. Consume less than 30 (preferably ZERO) grams of sugar per day from foods & drinks with High Fructose Corn Syrup (HFCS) sugar as #1,2,3 or # 4 on label.Whole Foods, [employer] & [employer] do not carry products with HFCS. Follow a  low carb nutrition program such as [place] or The [employer] Sugar Busters  to prevent Diabetes progression . White carbohydrates (potatoes, rice, bread, and pasta) have a high spike of sugar and a high load of sugar. For example a  baked potato has a cup of sugar and a  french fry  2 teaspoons of sugar. Yams, wild  rice, whole grained bread &  wheat pasta have been much lower spike and load of  sugar. Portions should be the size of a deck of cards or your palm.  Health Care Power of Attorney & Living Will place you in charge of your health care  decisions. Verify these are  in place. Blood Pressure Goal  Ideally is an AVERAGE < 135/85. This AVERAGE should be calculated from @ least 5-7 BP readings taken @ different times of day on different days of week. You should not respond to isolated BP readings , but rather the AVERAGE for that week .Please take your  blood pressure cuff to office visits to verify that it is reliable.It  can also be checked against the blood pressure device at the pharmacy. Finger or wrist cuffs are not dependable; an arm cuff is. If you activate My Chart; the results can be released to you as soon as they populate from the lab. If you choose not to use this program; the labs have to be reviewed, copied & mailed   causing a delay in getting the results to you.

## 2012-08-11 ENCOUNTER — Encounter: Payer: Self-pay | Admitting: Internal Medicine

## 2012-08-14 ENCOUNTER — Ambulatory Visit (INDEPENDENT_AMBULATORY_CARE_PROVIDER_SITE_OTHER)
Admission: RE | Admit: 2012-08-14 | Discharge: 2012-08-14 | Disposition: A | Discharge status: Home / Self Care | Payer: Medicare Other | Source: Ambulatory Visit

## 2012-08-14 DIAGNOSIS — N959 Unspecified menopausal and perimenopausal disorder: Secondary | ICD-10-CM

## 2012-08-27 ENCOUNTER — Ambulatory Visit (INDEPENDENT_AMBULATORY_CARE_PROVIDER_SITE_OTHER): Payer: Medicare Other

## 2012-08-27 DIAGNOSIS — Z23 Encounter for immunization: Secondary | ICD-10-CM

## 2012-11-12 HISTORY — PX: CATARACT EXTRACTION W/ INTRAOCULAR LENS  IMPLANT, BILATERAL: SHX1307

## 2013-06-01 HISTORY — PX: CATARACT EXTRACTION W/PHACO: SHX586

## 2013-07-02 HISTORY — PX: CATARACT EXTRACTION W/PHACO: SHX586

## 2013-08-07 ENCOUNTER — Telehealth: Payer: Self-pay

## 2013-08-07 NOTE — Telephone Encounter (Signed)
HM UTD WE Shingles, 1 pneumonia (polysaccride); due for colonoscopy Flu shot at [facility] Would like Zostavax at visit Meds reconciled, allergies and pharmacy verified  DM with Dr. Talmage Nap

## 2013-08-10 ENCOUNTER — Ambulatory Visit (INDEPENDENT_AMBULATORY_CARE_PROVIDER_SITE_OTHER): Payer: Medicare Other | Admitting: Internal Medicine

## 2013-08-10 ENCOUNTER — Encounter: Payer: Self-pay | Admitting: Internal Medicine

## 2013-08-10 VITALS — BP 149/82 | HR 76 | Temp 98.8°F | Ht 64.5 in | Wt 201.4 lb

## 2013-08-10 DIAGNOSIS — K5790 Diverticulosis of intestine, part unspecified, without perforation or abscess without bleeding: Secondary | ICD-10-CM

## 2013-08-10 DIAGNOSIS — R51 Headache: Secondary | ICD-10-CM

## 2013-08-10 DIAGNOSIS — E785 Hyperlipidemia, unspecified: Secondary | ICD-10-CM

## 2013-08-10 DIAGNOSIS — R519 Headache, unspecified: Secondary | ICD-10-CM

## 2013-08-10 DIAGNOSIS — R079 Chest pain, unspecified: Secondary | ICD-10-CM

## 2013-08-10 DIAGNOSIS — K5730 Diverticulosis of large intestine without perforation or abscess without bleeding: Secondary | ICD-10-CM

## 2013-08-10 DIAGNOSIS — E039 Hypothyroidism, unspecified: Secondary | ICD-10-CM

## 2013-08-10 DIAGNOSIS — Z0000 Encounter for general adult medical examination without abnormal findings: Secondary | ICD-10-CM

## 2013-08-10 LAB — TSH: TSH: 1.56 u[IU]/mL (ref 0.35–5.50)

## 2013-08-10 LAB — CBC WITH DIFFERENTIAL/PLATELET
Basophils Absolute: 0.1 10*3/uL (ref 0.0–0.1)
Hemoglobin: 14.6 g/dL (ref 12.0–15.0)
Lymphocytes Relative: 30.6 % (ref 12.0–46.0)
Monocytes Relative: 4.9 % (ref 3.0–12.0)
Neutro Abs: 5.1 10*3/uL (ref 1.4–7.7)
Neutrophils Relative %: 62.1 % (ref 43.0–77.0)
Platelets: 326 10*3/uL (ref 150.0–400.0)
RDW: 14 % (ref 11.5–14.6)

## 2013-08-10 LAB — LIPID PANEL
Total CHOL/HDL Ratio: 7
VLDL: 74 mg/dL — ABNORMAL HIGH (ref 0.0–40.0)

## 2013-08-10 LAB — BASIC METABOLIC PANEL
Calcium: 9.8 mg/dL (ref 8.4–10.5)
GFR: 72.89 mL/min (ref >60.00)
Glucose, Bld: 106 mg/dL — ABNORMAL HIGH (ref 70–99)
Sodium: 139 mEq/L (ref 135–145)

## 2013-08-10 LAB — HEPATIC FUNCTION PANEL
AST: 24 U/L (ref 0–37)
Alkaline Phosphatase: 66 U/L (ref 39–117)
Bilirubin, Direct: 0 mg/dL (ref 0.0–0.3)
Total Bilirubin: 0.7 mg/dL (ref 0.3–1.2)

## 2013-08-10 LAB — LDL CHOLESTEROL, DIRECT: Direct LDL: 155.1 mg/dL

## 2013-08-10 MED ORDER — ZOSTER VACCINE LIVE 19400 UNT/0.65ML ~~LOC~~ SOLR: 0.6500 mL | Freq: Once | SUBCUTANEOUS | Status: DC

## 2013-08-10 NOTE — Addendum Note (Signed)
Addended by: Verdie Shire on: 08/10/2013 11:46 AM   Modules accepted: Orders

## 2013-08-10 NOTE — Progress Notes (Signed)
Subjective:    Patient ID: Danielle Murray, female    DOB: Nov 22, 1940, 72 y.o.   MRN: [id number]   HPI  Medicare Wellness Visit:  Psychosocial & medical history were reviewed as required by Medicare (abuse,antisocial behavioral risks,firearm risk).  Social history: caffeine: 3 cups  Coffee/ day  , alcohol: rare wine   ,  tobacco use:   never Exercise :  See below Home & personal  safety / fall risk:no Limitations of activities of daily living:no Seatbelt  and smoke alarm use:yes Power of Attorney/Living Will status : not sure ( husband said yes) Ophthalmology exam status :curent Hearing evaluation status:not current Orientation :oriented X 3 Memory & recall :good Spelling  testing: good Active depression / anxiety:denied Foreign travel history : 6/14 Immunization status for Shingles /Flu/ PNA/ tetanus : Flu shot @ [facility]; Rx for Shingles Transfusion history:   Preventive health surveillance status of colonoscopy, BMD , mammograms,PAP as per protocol/ [facility] current Dental care:  annually Chart reviewed &  Updated. Active issues reviewed & addressed.      Review of Systems She is on a heart healthy diet; she is  Exercising as walking 1/4 mpd 3 per week with symptoms. Specifically she describes stinging chest pain with radiation to ears with racing w/o palpitations.Associated dyspnea on exertion; no associated claudication.  Family history is positive for premature coronary disease. Her twin dad MI in mid 45s . Advanced cholesterol testing reveals her LDL goal is less than 100, ideally < 70. There is medication non compliance with the statin due to arthralgias.  She also describes sharp pains in the occiput which can last seconds to minutes intermittently over the last year. There is no definite trigger or exacerbating factor for the headaches. This is associated with some stiffness in her neck at times. Ibuprofen is of variable benefit. She has no history of migraines or head trauma.  There is no family history of neurologic issues or migraine. She specifically denies associated blurred vision, double vision, loss of vision. There is no excessive tearing of her eyes or sensitivity to light or sound. There's been no associated tinnitus or hearing loss. There's also no vertigo. Weakness, numbness, or tingling in extremities was also denied.     Objective:   Physical Exam Gen.:  well-nourished in appearance. Weight excess. Alert, appropriate and cooperative throughout exam. Head: Normocephalic without obvious abnormalities Eyes: No corneal or conjunctival inflammation noted. Pupils equal round reactive to light and accommodation. FOV normal. Extraocular motion intact. Vision grossly normal with lens implants. Ptosis Ears: External  ear exam reveals no significant lesions or deformities. Canals clear .TMs normal. Hearing is grossly normal bilaterally. Nose: External nasal exam reveals no deformity or inflammation. Nasal mucosa are pink and moist. No lesions or exudates noted.   Mouth: Oral mucosa and oropharynx reveal no lesions or exudates. Teeth in good repair. Neck: No deformities, masses, or tenderness noted. Range of motion decreased. Thyroid normal. Lungs: Normal respiratory effort; chest expands symmetrically. Lungs are clear to auscultation without rales, wheezes, or increased work of breathing. Heart: Normal rate and rhythm. Normal S1 and S2. No gallop, click, or rub. No murmur. Abdomen: Bowel sounds normal; abdomen soft and nontender. No masses, organomegaly or hernias noted. Genitalia: As per Gyn                                  Musculoskeletal/extremities:  Accentuated curvature of upper thoracic  Spine. No clubbing, cyanosis, edema, or significant extremity  deformity noted. Range of motion decreased @ knees .Tone & strength  Normal. Joints reveal mild  isolated DJD DIP changes. Nail health good. Able to lie down & sit up w/o help. Negative SLR bilaterally Vascular:  Carotid, radial artery, dorsalis pedis and  posterior tibial pulses are full and equal. No bruits present. Neurologic: Alert and oriented x3. Deep tendon reflexes symmetrical and normal.  No cranial nerve deficit .        Skin: Intact without suspicious lesions or rashes. Lymph: No cervical, axillary lymphadenopathy present. Psych: Mood and affect are normal. Normally interactive . La belle indifference to risks                                                                                      Assessment & Plan:  #1 Medicare Wellness Exam; criteria met ; data entered #2 anginal equivalent #3 occipital headache, ? From cervical DDD with neuralgia Plan:  Assessments made/ Orders entered

## 2013-08-10 NOTE — Patient Instructions (Addendum)
Share results with all non [facility] medical staff seen  Please keep a diary of your headaches . Document  each occurrence on the calendar with notation of : #1 any prodrome ( any non headache symptom such as marked fatigue,visual changes, ,etc ) which precedes actual headache ; #2) severity on 1-10 scale; #3) any triggers ( food/ drink,enviromenntal or weather changes ,physical or emotional stress) in 8-12 hour period prior to the headache; & #4) response to any medications or other intervention. Please review "Headache" @ [facility] for additional information

## 2013-08-12 ENCOUNTER — Ambulatory Visit: Payer: Medicare Other

## 2013-08-12 DIAGNOSIS — R7309 Other abnormal glucose: Secondary | ICD-10-CM

## 2013-08-12 LAB — HEMOGLOBIN A1C: Hgb A1c MFr Bld: 8.2 % — ABNORMAL HIGH (ref 4.6–6.5)

## 2013-08-28 ENCOUNTER — Encounter: Payer: Self-pay | Admitting: Cardiovascular Disease

## 2013-08-28 ENCOUNTER — Encounter: Payer: Self-pay | Admitting: *Deleted

## 2013-08-31 ENCOUNTER — Encounter: Payer: Self-pay | Admitting: *Deleted

## 2013-08-31 ENCOUNTER — Other Ambulatory Visit: Payer: Self-pay | Admitting: *Deleted

## 2013-08-31 ENCOUNTER — Ambulatory Visit (INDEPENDENT_AMBULATORY_CARE_PROVIDER_SITE_OTHER): Payer: Medicare Other | Admitting: Cardiovascular Disease

## 2013-08-31 ENCOUNTER — Ambulatory Visit (INDEPENDENT_AMBULATORY_CARE_PROVIDER_SITE_OTHER)
Admission: RE | Admit: 2013-08-31 | Discharge: 2013-08-31 | Disposition: A | Discharge status: Home / Self Care | Payer: Medicare Other | Source: Ambulatory Visit | Attending: Cardiovascular Disease | Admitting: Cardiovascular Disease

## 2013-08-31 VITALS — BP 150/80 | HR 74 | Ht 64.0 in | Wt 201.0 lb

## 2013-08-31 DIAGNOSIS — E785 Hyperlipidemia, unspecified: Secondary | ICD-10-CM

## 2013-08-31 DIAGNOSIS — Z01810 Encounter for preprocedural cardiovascular examination: Secondary | ICD-10-CM

## 2013-08-31 DIAGNOSIS — R079 Chest pain, unspecified: Secondary | ICD-10-CM | POA: Insufficient documentation

## 2013-08-31 DIAGNOSIS — IMO0001 Reserved for inherently not codable concepts without codable children: Secondary | ICD-10-CM

## 2013-08-31 LAB — CBC WITH DIFFERENTIAL/PLATELET
Basophils Absolute: 0.1 10*3/uL (ref 0.0–0.1)
Basophils Relative: 0.8 % (ref 0.0–3.0)
Eosinophils Absolute: 0.2 10*3/uL (ref 0.0–0.7)
Eosinophils Relative: 1.9 % (ref 0.0–5.0)
Hemoglobin: 14.3 g/dL (ref 12.0–15.0)
Lymphocytes Relative: 28.5 % (ref 12.0–46.0)
MCHC: 34.1 g/dL (ref 30.0–36.0)
MCV: 85.3 fl (ref 78.0–100.0)
Monocytes Absolute: 0.5 10*3/uL (ref 0.1–1.0)
Monocytes Relative: 5.8 % (ref 3.0–12.0)
Neutro Abs: 5.1 10*3/uL (ref 1.4–7.7)
Neutrophils Relative %: 63 % (ref 43.0–77.0)
RBC: 4.91 Mil/uL (ref 3.87–5.11)
WBC: 8 10*3/uL (ref 4.5–10.5)

## 2013-08-31 LAB — BASIC METABOLIC PANEL
BUN: 18 mg/dL (ref 6–23)
CO2: 29 mEq/L (ref 19–32)
Chloride: 102 mEq/L (ref 96–112)
Potassium: 4 mEq/L (ref 3.5–5.1)

## 2013-08-31 LAB — PROTIME-INR
INR: 1 ratio (ref 0.8–1.0)
Prothrombin Time: 10.1 s — ABNORMAL LOW (ref 10.2–12.4)

## 2013-08-31 MED ORDER — CLOPIDOGREL BISULFATE 75 MG PO TABS: ORAL_TABLET | ORAL | Status: DC

## 2013-08-31 MED ORDER — NITROGLYCERIN 0.4 MG SL SUBL: 0.4000 mg | SUBLINGUAL_TABLET | SUBLINGUAL | Status: DC | PRN

## 2013-08-31 MED ORDER — CARVEDILOL 3.125 MG PO TABS: 3.1250 mg | ORAL_TABLET | Freq: Two times a day (BID) | ORAL | Status: DC

## 2013-08-31 NOTE — Progress Notes (Signed)
Patient ID: Danielle Murray, female   DOB: [birth year], 72 y.o.   MRN: [id number] 72 yo referred by Dr Alwyn Ren for chest pain  CRF;s HTN and DM.  Classic angina since September.  Every time she walks gets SSCP radiating to neck.  Eases when she stops No resting pain.  Accompanied by palpitations and dyspnea.  Stress test maybe 10 years ago was ok.  DM poorly controlled.  No constrast allergy Discussed cath with patient including risks of stroke < MI, constrast allergy and need for surgery Willing to proceed.  Gets pain everytime she walks and was told by primary not to go on walks until seen by cardiology  Marked family history of CAD including identical twin sister    ROS: Denies fever, malais, weight loss, blurry vision, decreased visual acuity, cough, sputum, SOB, hemoptysis, pleuritic pain, palpitaitons, heartburn, abdominal pain, melena, lower extremity edema, claudication, or rash.  All other systems reviewed and negative   General: Affect appropriate Healthy:  appears stated age HEENT: normal Neck supple with no adenopathy JVP normal no bruits no thyromegaly Lungs clear with no wheezing and good diaphragmatic motion Heart:  S1/S2 no murmur,rub, gallop or click PMI normal Abdomen: benighn, BS positve, no tenderness, no AAA no bruit.  No HSM or HJR Distal pulses intact with no bruits No edema Neuro non-focal Skin warm and dry No muscular weakness  Medications Current Outpatient Prescriptions  Medication Sig Dispense Refill  . aspirin 81 MG tablet Take 81 mg by mouth daily.      . insulin aspart (NOVOLOG FLEXPEN) 100 UNIT/ML injection Inject 100 Units into the skin daily. Take 40 units at breakfast and 60 units at supper.      . levothyroxine (SYNTHROID, LEVOTHROID) 75 MCG tablet Take 75 mcg by mouth daily. M-F only      . losartan-hydrochlorothiazide (HYZAAR) 100-25 MG per tablet Take 1 tablet by mouth daily.      . metFORMIN (GLUCOPHAGE) 500 MG tablet Take 500 mg by mouth daily.        . MULTIPLE VITAMIN PO Take 1 tablet by mouth daily.       No current facility-administered medications for this visit.    Allergies Penicillins  Family History: Family History  Problem Relation Age of Onset  . Asthma Mother   . Hypertension Mother   . Heart disease Mother   . Hypertension Father   . Stroke Father 50  . Heart attack Father 55  . Hypertension Sister   . Hyperlipidemia Sister   . Heart attack Sister     pre 87; Twin  . Diabetes Sister     her TWIN  . Hypertension Brother   . Hyperlipidemia Brother   . Coronary artery disease Brother     stents late 40s    Social History: History   Social History  . Marital Status: Married    Spouse Name: N/A    Number of Children: N/A  . Years of Education: N/A   Occupational History  . Not on file.   Social History Main Topics  . Smoking status: Never Smoker   . Smokeless tobacco: Never Used  . Alcohol Use: Yes     Comment:  Rare wine  . Drug Use: Not on file  . Sexual Activity: Not on file   Other Topics Concern  . Not on file   Social History Narrative  . No narrative on file    Electrocardiogram:  9/29  SR rate 75 normal  Assessment and Plan

## 2013-08-31 NOTE — Assessment & Plan Note (Signed)
Cholesterol is at goal.  Continue current dose of statin and diet Rx.  No myalgias or side effects.  F/U  LFT's in 6 months. Lab Results  Component Value Date   LDLCALC 90 01/02/2008

## 2013-08-31 NOTE — Assessment & Plan Note (Signed)
Seems classic for angina ECG and exam ok  Cath scheduled Thursday with Dr Excell Seltzer.  Start Plavix and beta blocker

## 2013-08-31 NOTE — Patient Instructions (Addendum)
Your physician has requested that you have a cardiac catheterization. Cardiac catheterization is used to diagnose and/or treat various heart conditions. Doctors may recommend this procedure for a number of different reasons. The most common reason is to evaluate chest pain. Chest pain can be a symptom of coronary artery disease (CAD), and cardiac catheterization can show whether plaque is narrowing or blocking your heart's arteries. This procedure is also used to evaluate the valves, as well as measure the blood flow and oxygen levels in different parts of your heart. For further information please visit [email or web address] Please follow instruction sheet, as given.  Your physician has recommended you make the following change in your medication: START  PLAVIX  300 MG  TODAY  AND THEN  75 MG  CARVEDILOL  3.125 MG  TWICE A  DAY   NITRO  AS NEEDED Your physician recommends that you return for lab work in:  CBC  WITH  DIFF  BMET INR A chest x-ray takes a picture of the organs and structures inside the chest, including the heart, lungs, and blood vessels. This test can show several things, including, whether the heart is enlarges; whether fluid is building up in the lungs; and whether pacemaker / defibrillator leads are still in place.

## 2013-08-31 NOTE — Assessment & Plan Note (Signed)
Discussed low carb diet.  Target hemoglobin A1c is 6.5 or less.  Continue current medications.  

## 2013-09-01 ENCOUNTER — Other Ambulatory Visit: Payer: Self-pay | Admitting: Cardiovascular Disease

## 2013-09-01 DIAGNOSIS — R079 Chest pain, unspecified: Secondary | ICD-10-CM

## 2013-09-02 ENCOUNTER — Encounter (HOSPITAL_COMMUNITY): Payer: Self-pay | Admitting: Pharmacy Technician

## 2013-09-03 ENCOUNTER — Encounter (HOSPITAL_COMMUNITY)
Admission: RE | Disposition: A | Discharge status: Home / Self Care | Payer: Self-pay | Source: Ambulatory Visit | Attending: Cardiovascular Disease

## 2013-09-03 ENCOUNTER — Ambulatory Visit (HOSPITAL_COMMUNITY)
Admission: RE | Admit: 2013-09-03 | Discharge: 2013-09-04 | Disposition: A | Discharge status: Home / Self Care | Payer: Medicare Other | Source: Ambulatory Visit | Attending: Cardiovascular Disease | Admitting: Cardiovascular Disease

## 2013-09-03 ENCOUNTER — Encounter (HOSPITAL_COMMUNITY): Payer: Self-pay | Admitting: General Practice

## 2013-09-03 DIAGNOSIS — I498 Other specified cardiac arrhythmias: Secondary | ICD-10-CM | POA: Insufficient documentation

## 2013-09-03 DIAGNOSIS — Z8249 Family history of ischemic heart disease and other diseases of the circulatory system: Secondary | ICD-10-CM | POA: Insufficient documentation

## 2013-09-03 DIAGNOSIS — I200 Unstable angina: Secondary | ICD-10-CM | POA: Insufficient documentation

## 2013-09-03 DIAGNOSIS — R079 Chest pain, unspecified: Secondary | ICD-10-CM

## 2013-09-03 DIAGNOSIS — Z955 Presence of coronary angioplasty implant and graft: Secondary | ICD-10-CM

## 2013-09-03 DIAGNOSIS — I10 Essential (primary) hypertension: Secondary | ICD-10-CM | POA: Diagnosis present

## 2013-09-03 DIAGNOSIS — F3289 Other specified depressive episodes: Secondary | ICD-10-CM | POA: Insufficient documentation

## 2013-09-03 DIAGNOSIS — E1165 Type 2 diabetes mellitus with hyperglycemia: Secondary | ICD-10-CM | POA: Diagnosis present

## 2013-09-03 DIAGNOSIS — K5730 Diverticulosis of large intestine without perforation or abscess without bleeding: Secondary | ICD-10-CM | POA: Insufficient documentation

## 2013-09-03 DIAGNOSIS — I214 Non-ST elevation (NSTEMI) myocardial infarction: Secondary | ICD-10-CM

## 2013-09-03 DIAGNOSIS — R0989 Other specified symptoms and signs involving the circulatory and respiratory systems: Secondary | ICD-10-CM | POA: Insufficient documentation

## 2013-09-03 DIAGNOSIS — F329 Major depressive disorder, single episode, unspecified: Secondary | ICD-10-CM | POA: Insufficient documentation

## 2013-09-03 DIAGNOSIS — E1151 Type 2 diabetes mellitus with diabetic peripheral angiopathy without gangrene: Secondary | ICD-10-CM | POA: Diagnosis present

## 2013-09-03 DIAGNOSIS — I2510 Atherosclerotic heart disease of native coronary artery without angina pectoris: Secondary | ICD-10-CM

## 2013-09-03 DIAGNOSIS — R0609 Other forms of dyspnea: Secondary | ICD-10-CM | POA: Insufficient documentation

## 2013-09-03 DIAGNOSIS — I2089 Other forms of angina pectoris: Secondary | ICD-10-CM

## 2013-09-03 DIAGNOSIS — E039 Hypothyroidism, unspecified: Secondary | ICD-10-CM | POA: Insufficient documentation

## 2013-09-03 DIAGNOSIS — I208 Other forms of angina pectoris: Secondary | ICD-10-CM

## 2013-09-03 DIAGNOSIS — E785 Hyperlipidemia, unspecified: Secondary | ICD-10-CM | POA: Diagnosis present

## 2013-09-03 DIAGNOSIS — IMO0002 Reserved for concepts with insufficient information to code with codable children: Secondary | ICD-10-CM | POA: Diagnosis present

## 2013-09-03 DIAGNOSIS — E119 Type 2 diabetes mellitus without complications: Secondary | ICD-10-CM | POA: Insufficient documentation

## 2013-09-03 DIAGNOSIS — Z79899 Other long term (current) drug therapy: Secondary | ICD-10-CM | POA: Insufficient documentation

## 2013-09-03 DIAGNOSIS — M129 Arthropathy, unspecified: Secondary | ICD-10-CM | POA: Insufficient documentation

## 2013-09-03 DIAGNOSIS — I471 Supraventricular tachycardia, unspecified: Secondary | ICD-10-CM

## 2013-09-03 DIAGNOSIS — Z794 Long term (current) use of insulin: Secondary | ICD-10-CM | POA: Insufficient documentation

## 2013-09-03 HISTORY — DX: Unspecified osteoarthritis, unspecified site: M19.90

## 2013-09-03 HISTORY — DX: Atherosclerotic heart disease of native coronary artery without angina pectoris: I25.10

## 2013-09-03 HISTORY — DX: Supraventricular tachycardia, unspecified: I47.10

## 2013-09-03 HISTORY — PX: LEFT HEART CATHETERIZATION WITH CORONARY ANGIOGRAM: SHX5451

## 2013-09-03 HISTORY — DX: Supraventricular tachycardia: I47.1

## 2013-09-03 HISTORY — PX: CORONARY ANGIOPLASTY WITH STENT PLACEMENT: SHX49

## 2013-09-03 HISTORY — DX: Type 2 diabetes mellitus without complications: E11.9

## 2013-09-03 LAB — GLUCOSE, CAPILLARY: Glucose-Capillary: 195 mg/dL — ABNORMAL HIGH (ref 70–99)

## 2013-09-03 LAB — POCT ACTIVATED CLOTTING TIME: Activated Clotting Time: 211 seconds

## 2013-09-03 SURGERY — LEFT HEART CATHETERIZATION WITH CORONARY ANGIOGRAM
Anesthesia: LOCAL

## 2013-09-03 MED ORDER — INSULIN ASPART PROT & ASPART (70-30 MIX) 100 UNIT/ML ~~LOC~~ SUSP
60.0000 [IU] | Freq: Every day | SUBCUTANEOUS | Status: DC
  Administered 2013-09-03: 30 [IU] via SUBCUTANEOUS
  Filled 2013-09-03: qty 10

## 2013-09-03 MED ORDER — MIDAZOLAM HCL 2 MG/2ML IJ SOLN
INTRAMUSCULAR | Status: AC
  Filled 2013-09-03: qty 2

## 2013-09-03 MED ORDER — LOSARTAN POTASSIUM 50 MG PO TABS
100.0000 mg | ORAL_TABLET | Freq: Every day | ORAL | Status: DC
  Administered 2013-09-04: 100 mg via ORAL
  Filled 2013-09-03 (×2): qty 2

## 2013-09-03 MED ORDER — FENTANYL CITRATE 0.05 MG/ML IJ SOLN
INTRAMUSCULAR | Status: AC
  Filled 2013-09-03: qty 2

## 2013-09-03 MED ORDER — INSULIN ASPART PROT & ASPART (70-30 MIX) 100 UNIT/ML ~~LOC~~ SUSP
40.0000 [IU] | Freq: Every day | SUBCUTANEOUS | Status: DC
  Administered 2013-09-04: 10:00:00 40 [IU] via SUBCUTANEOUS
  Filled 2013-09-03: qty 10

## 2013-09-03 MED ORDER — ASPIRIN 81 MG PO CHEW
CHEWABLE_TABLET | ORAL | Status: AC
  Filled 2013-09-03: qty 1

## 2013-09-03 MED ORDER — ASPIRIN 81 MG PO TABS: 81.0000 mg | ORAL_TABLET | Freq: Every day | ORAL | Status: DC

## 2013-09-03 MED ORDER — NITROGLYCERIN 0.2 MG/ML ON CALL CATH LAB
INTRAVENOUS | Status: AC
  Filled 2013-09-03: qty 1

## 2013-09-03 MED ORDER — HEPARIN (PORCINE) IN NACL 2-0.9 UNIT/ML-% IJ SOLN
INTRAMUSCULAR | Status: AC
  Filled 2013-09-03: qty 1000

## 2013-09-03 MED ORDER — SODIUM CHLORIDE 0.9 % IJ SOLN: 3.0000 mL | INTRAMUSCULAR | Status: DC | PRN

## 2013-09-03 MED ORDER — HYDROCHLOROTHIAZIDE 25 MG PO TABS
25.0000 mg | ORAL_TABLET | Freq: Every day | ORAL | Status: DC
  Administered 2013-09-04: 25 mg via ORAL
  Filled 2013-09-03 (×2): qty 1

## 2013-09-03 MED ORDER — ACETAMINOPHEN 325 MG PO TABS
650.0000 mg | ORAL_TABLET | ORAL | Status: DC | PRN
  Administered 2013-09-03: 16:00:00 650 mg via ORAL
  Filled 2013-09-03 (×2): qty 2

## 2013-09-03 MED ORDER — SODIUM CHLORIDE 0.9 % IV SOLN: 250.0000 mL | INTRAVENOUS | Status: DC | PRN

## 2013-09-03 MED ORDER — SODIUM CHLORIDE 0.9 % IV SOLN: INTRAVENOUS | Status: DC

## 2013-09-03 MED ORDER — CARVEDILOL 3.125 MG PO TABS
3.1250 mg | ORAL_TABLET | Freq: Two times a day (BID) | ORAL | Status: DC
  Administered 2013-09-03 – 2013-09-04 (×2): 3.125 mg via ORAL
  Filled 2013-09-03 (×3): qty 1

## 2013-09-03 MED ORDER — LOSARTAN POTASSIUM-HCTZ 100-25 MG PO TABS: 1.0000 | ORAL_TABLET | Freq: Every day | ORAL | Status: DC

## 2013-09-03 MED ORDER — HEPARIN SODIUM (PORCINE) 1000 UNIT/ML IJ SOLN
INTRAMUSCULAR | Status: AC
  Filled 2013-09-03: qty 1

## 2013-09-03 MED ORDER — LIDOCAINE HCL (PF) 1 % IJ SOLN
INTRAMUSCULAR | Status: AC
  Filled 2013-09-03: qty 30

## 2013-09-03 MED ORDER — LEVOTHYROXINE SODIUM 75 MCG PO TABS
75.0000 ug | ORAL_TABLET | ORAL | Status: DC
  Administered 2013-09-04: 08:00:00 75 ug via ORAL
  Filled 2013-09-03 (×3): qty 1

## 2013-09-03 MED ORDER — SODIUM CHLORIDE 0.9 % IV SOLN: 1.0000 mL/kg/h | INTRAVENOUS | Status: AC

## 2013-09-03 MED ORDER — OXYCODONE-ACETAMINOPHEN 5-325 MG PO TABS
1.0000 | ORAL_TABLET | ORAL | Status: DC | PRN
  Administered 2013-09-03 – 2013-09-04 (×2): 1 via ORAL
  Filled 2013-09-03 (×2): qty 1

## 2013-09-03 MED ORDER — CLOPIDOGREL BISULFATE 75 MG PO TABS
75.0000 mg | ORAL_TABLET | Freq: Every day | ORAL | Status: DC
  Administered 2013-09-03 – 2013-09-04 (×2): 75 mg via ORAL
  Filled 2013-09-03 (×2): qty 1

## 2013-09-03 MED ORDER — ASPIRIN 81 MG PO CHEW: 81.0000 mg | CHEWABLE_TABLET | ORAL | Status: DC

## 2013-09-03 MED ORDER — DIAZEPAM 5 MG PO TABS: 5.0000 mg | ORAL_TABLET | Freq: Four times a day (QID) | ORAL | Status: DC | PRN

## 2013-09-03 MED ORDER — ONDANSETRON HCL 4 MG/2ML IJ SOLN: 4.0000 mg | Freq: Four times a day (QID) | INTRAMUSCULAR | Status: DC | PRN

## 2013-09-03 MED ORDER — SODIUM CHLORIDE 0.9 % IJ SOLN: 3.0000 mL | Freq: Two times a day (BID) | INTRAMUSCULAR | Status: DC

## 2013-09-03 MED ORDER — VERAPAMIL HCL 2.5 MG/ML IV SOLN
INTRAVENOUS | Status: AC
  Filled 2013-09-03: qty 2

## 2013-09-03 MED ORDER — NITROGLYCERIN 0.4 MG SL SUBL: 0.4000 mg | SUBLINGUAL_TABLET | SUBLINGUAL | Status: DC | PRN

## 2013-09-03 MED ORDER — ASPIRIN 81 MG PO CHEW
81.0000 mg | CHEWABLE_TABLET | Freq: Every day | ORAL | Status: DC
  Administered 2013-09-04: 10:00:00 81 mg via ORAL
  Filled 2013-09-03: qty 1

## 2013-09-03 MED ORDER — INSULIN ASPART 100 UNIT/ML ~~LOC~~ SOLN: 40.0000 [IU] | Freq: Two times a day (BID) | SUBCUTANEOUS | Status: DC

## 2013-09-03 NOTE — H&P (View-Only) (Signed)
Patient ID: Danielle Murray, female   DOB: 08/23/1941, 72 y.o.   MRN: [id number] 72 yo referred by Dr Hopper for chest pain  CRF;s HTN and DM.  Classic angina since September.  Every time she walks gets SSCP radiating to neck.  Eases when she stops No resting pain.  Accompanied by palpitations and dyspnea.  Stress test maybe 10 years ago was ok.  DM poorly controlled.  No constrast allergy Discussed cath with patient including risks of stroke < MI, constrast allergy and need for surgery Willing to proceed.  Gets pain everytime she walks and was told by primary not to go on walks until seen by cardiology  Marked family history of CAD including identical twin sister    ROS: Denies fever, malais, weight loss, blurry vision, decreased visual acuity, cough, sputum, SOB, hemoptysis, pleuritic pain, palpitaitons, heartburn, abdominal pain, melena, lower extremity edema, claudication, or rash.  All other systems reviewed and negative   General: Affect appropriate Healthy:  appears stated age HEENT: normal Neck supple with no adenopathy JVP normal no bruits no thyromegaly Lungs clear with no wheezing and good diaphragmatic motion Heart:  S1/S2 no murmur,rub, gallop or click PMI normal Abdomen: benighn, BS positve, no tenderness, no AAA no bruit.  No HSM or HJR Distal pulses intact with no bruits No edema Neuro non-focal Skin warm and dry No muscular weakness  Medications Current Outpatient Prescriptions  Medication Sig Dispense Refill  . aspirin 81 MG tablet Take 81 mg by mouth daily.      . insulin aspart (NOVOLOG FLEXPEN) 100 UNIT/ML injection Inject 100 Units into the skin daily. Take 40 units at breakfast and 60 units at supper.      . levothyroxine (SYNTHROID, LEVOTHROID) 75 MCG tablet Take 75 mcg by mouth daily. M-F only      . losartan-hydrochlorothiazide (HYZAAR) 100-25 MG per tablet Take 1 tablet by mouth daily.      . metFORMIN (GLUCOPHAGE) 500 MG tablet Take 500 mg by mouth daily.        . MULTIPLE VITAMIN PO Take 1 tablet by mouth daily.       No current facility-administered medications for this visit.    Allergies Penicillins  Family History: Family History  Problem Relation Age of Onset  . Asthma Mother   . Hypertension Mother   . Heart disease Mother   . Hypertension Father   . Stroke Father 55  . Heart attack Father 70  . Hypertension Sister   . Hyperlipidemia Sister   . Heart attack Sister     pre 65; Twin  . Diabetes Sister     her TWIN  . Hypertension Brother   . Hyperlipidemia Brother   . Coronary artery disease Brother     stents late 50s    Social History: History   Social History  . Marital Status: Married    Spouse Name: N/A    Number of Children: N/A  . Years of Education: N/A   Occupational History  . Not on file.   Social History Main Topics  . Smoking status: Never Smoker   . Smokeless tobacco: Never Used  . Alcohol Use: Yes     Comment:  Rare wine  . Drug Use: Not on file  . Sexual Activity: Not on file   Other Topics Concern  . Not on file   Social History Narrative  . No narrative on file    Electrocardiogram:  9/29  SR rate 75 normal     Assessment and Plan   

## 2013-09-03 NOTE — Interval H&P Note (Signed)
History and Physical Interval Note:  09/03/2013 1:35 PM  Danielle Murray  has presented today for surgery, with the diagnosis of cp  The various methods of treatment have been discussed with the patient and family. After consideration of risks, benefits and other options for treatment, the patient has consented to  Procedure(s): LEFT HEART CATHETERIZATION WITH CORONARY ANGIOGRAM (N/A) as a surgical intervention .  The patient's history has been reviewed, patient examined, no change in status, stable for surgery.  I have reviewed the patient's chart and labs.  Questions were answered to the patient's satisfaction.    Cath Lab Visit (complete for each Cath Lab visit)  Clinical Evaluation Leading to the Procedure:   ACS: no  Non-ACS:    Anginal Classification: CCS III  Anti-ischemic medical therapy: Minimal Therapy (1 class of medications)  Non-Invasive Test Results: No non-invasive testing performed  Prior CABG: No previous CABG         Tonny Bollman

## 2013-09-03 NOTE — CV Procedure (Addendum)
Cardiac Catheterization Procedure Note  Name: Danielle Murray MRN: [id number] DOB: 19-Apr-1941  Procedure: Left Heart Cath, Selective Coronary Angiography, LV angiography, PTCA and stenting of the proximal LAD.  Indication: 72 year old diabetic woman with 6-8 weeks of progressive exertional angina, now CCS class III on a background of beta blocker therapy. With classic symptoms at low level activity she was referred directly for cardiac catheterization and possible PCI because of high pretest probability.   Procedural Details: The right wrist was prepped, draped, and anesthetized with 1% lidocaine. Using the modified Seldinger technique, a 5/6 French sheath was introduced into the right radial artery. 3 mg of verapamil was administered through the sheath, weight-based unfractionated heparin was administered intravenously. Standard Judkins catheters were used for selective coronary angiography and left ventriculography. Catheter exchanges were performed over an exchange length guidewire. There were no immediate procedural complications. A TR band was used for radial hemostasis at the completion of the procedure.  The patient was transferred to the post catheterization recovery area for further monitoring.  Procedural Findings: Hemodynamics: AO 172/61 LV 169/15  Coronary angiography: Coronary dominance: right  Left mainstem: Arises from the left cusp. There is mild calcification. The left main divides into the LAD and left circumflex.  Left anterior descending (LAD): The LAD has severe proximal vessel disease. There is mild calcification. The proximal LAD has 80% stenosis. Just after the first diagonal there is 95% stenosis. The diagonal branch is large in caliber and has no significant disease. The mid and distal LAD have mild diffuse disease in the LAD terminates in the distal anterior wall.  Left circumflex (LCx): The left circumflex is a large, dominant vessel. The proximal vessel has  30-40% stenosis. The mid vessel is widely patent with mild nonobstructive disease. The first obtuse marginal branch is patent with diffuse 80% stenosis but this vessel is quite small in caliber. The circumflex gives off 2 left posterolateral branches and a left PDA branch. The PLA branches are patent and the PDA branch has diffuse mid to distal 90% stenosis.  Right coronary artery (RCA): The RCA is small and nondominant. There is proximal 50-70% stenosis.  Left ventriculography: Left ventricular systolic function is normal, LVEF is estimated at 65%, there is no significant mitral regurgitation   PCI note: Following the diagnostic procedure, the decision was made to proceed with PCI of the proximal LAD where there was severe 95% stenosis. I initially tried to pass a 6 Fr guide but this would not pass through the forearm even with additional NTG and verapamil. A 5 Fr EBU guide was used. Heparin was used for anticoagulation. The patient had been preloaded with ASA and plavix. Once a therapeutic ACT was obtained, a cougar wire was advanced past the lesion. The lesion was predilated with a 2.5 x 15 mm balloon on 2 inflations. The lesion was then stented with a 2.5x24 mm Promus premier DES. The stent was then post-dilated with a 2.75 Stamps balloon to 18 atmospheres. There was an excellent result with 0% residual stenosis and TIMI-3 flow.  PCI Lesion Data: Lesion site: prox LAD Lesion % stenosis: 95 TIMI flow: 3 Stent: 2.5x24 mm Promus Premier DES Post-stenosis: 0% Post-TIMI flow: 3  Final Conclusions:   1. Severe diffuse stenosis of the proximal and mid LAD treated successfully with a single drug-eluting stent. 2. Large, dominant left circumflex with severe stenosis of a mid-distal left PDA branch 3. Small, nondominant RCA with moderate stenosis 4. Normal left ventricular systolic function.  Recommendations:  The patient should continue with aspirin and Plavix for at least 12 months. Recommend medical  therapy for her residual CAD. If she fails medical therapy, consideration could be made for PCI of the left PDA branch. I suspect she will have significant symptomatic improvement with treatment of her proximal LAD stenosis.  Tonny Bollman 09/03/2013, 2:39 PM

## 2013-09-03 NOTE — Progress Notes (Signed)
TR BAND REMOVAL  LOCATION:    right radial  DEFLATED PER PROTOCOL:    yes  TIME BAND OFF / DRESSING APPLIED:    1850   SITE UPON ARRIVAL:    Level 0  SITE AFTER BAND REMOVAL:    Level 0  REVERSE ALLEN'S TEST:     positive  CIRCULATION SENSATION AND MOVEMENT:    Within Normal Limits   yes  COMMENTS:   Gauze dressing applied. Rechecked at 1920. Gauze dressing remains dry and intact, pulses palpable, CSMs wnls.

## 2013-09-04 ENCOUNTER — Encounter (HOSPITAL_COMMUNITY): Payer: Self-pay | Admitting: Physician Assistant

## 2013-09-04 DIAGNOSIS — R079 Chest pain, unspecified: Secondary | ICD-10-CM

## 2013-09-04 DIAGNOSIS — I208 Other forms of angina pectoris: Secondary | ICD-10-CM

## 2013-09-04 DIAGNOSIS — I471 Supraventricular tachycardia: Secondary | ICD-10-CM

## 2013-09-04 DIAGNOSIS — I2510 Atherosclerotic heart disease of native coronary artery without angina pectoris: Secondary | ICD-10-CM

## 2013-09-04 LAB — BASIC METABOLIC PANEL
BUN: 16 mg/dL (ref 6–23)
Calcium: 9.1 mg/dL (ref 8.4–10.5)
Creatinine, Ser: 0.8 mg/dL (ref 0.50–1.10)
GFR calc Af Amer: 84 mL/min — ABNORMAL LOW (ref >90)
GFR calc non Af Amer: 72 mL/min — ABNORMAL LOW (ref >90)
Glucose, Bld: 145 mg/dL — ABNORMAL HIGH (ref 70–99)
Sodium: 139 mEq/L (ref 135–145)

## 2013-09-04 LAB — GLUCOSE, CAPILLARY
Glucose-Capillary: 147 mg/dL — ABNORMAL HIGH (ref 70–99)
Glucose-Capillary: 147 mg/dL — ABNORMAL HIGH (ref 70–99)

## 2013-09-04 LAB — CBC
HCT: 38.8 % (ref 36.0–46.0)
Hemoglobin: 13.3 g/dL (ref 12.0–15.0)
MCH: 29.6 pg (ref 26.0–34.0)
MCHC: 34.3 g/dL (ref 30.0–36.0)
MCV: 86.2 fL (ref 78.0–100.0)
RDW: 13.5 % (ref 11.5–15.5)

## 2013-09-04 MED ORDER — ATORVASTATIN CALCIUM 40 MG PO TABS
40.0000 mg | ORAL_TABLET | Freq: Every day | ORAL | Status: DC
  Filled 2013-09-04: qty 1

## 2013-09-04 MED ORDER — ATORVASTATIN CALCIUM 40 MG PO TABS: 40.0000 mg | ORAL_TABLET | Freq: Every evening | ORAL | Status: DC

## 2013-09-04 MED ORDER — LIVING WELL WITH DIABETES BOOK
Freq: Once | Status: AC
  Administered 2013-09-04: 01:00:00
  Filled 2013-09-04: qty 1

## 2013-09-04 MED ORDER — METFORMIN HCL 500 MG PO TABS: 500.0000 mg | ORAL_TABLET | Freq: Every day | ORAL | Status: DC

## 2013-09-04 NOTE — Progress Notes (Signed)
CARDIAC REHAB PHASE I   PRE:  Rate/Rhythm: 72 SR  BP:  Supine:   Sitting: 146/49  Standing:    SaO2:   MODE:  Ambulation: 400  ft   POST:  Rate/Rhythm: 93 SR  BP:  Supine:   Sitting: 165/58  Standing:    SaO2:  Patient tolerated ambulation well independently.  No angina, balance and gait normal.  Education completed re: home exercise, diabetes/nutritional tips re: carbohydrate servings, NTG usage, and endpoints for exercise.  Interested in phase II cardiac rehab, referred to program at [facility].  [id number]  Cindra Eves RN, BSN 09/04/2013 9:01 AM

## 2013-09-04 NOTE — Progress Notes (Signed)
Patient: Danielle Murray / Admit Date: 09/03/2013 / Date of Encounter: 09/04/2013, 6:56 AM   Subjective  Feels great today. No CP or SOB. Slept well.  Objective   Telemetry: NSR, occ PACs, transient rapid irregular atrial arrhythmia last night around 1:30am  Physical Exam: Blood pressure 137/47, pulse 69, temperature 97.9 F (36.6 C), temperature source Oral, resp. rate 18, height 5\' 4"  (1.626 m), weight 199 lb 4.7 oz (90.4 kg), SpO2 97.00%. General: Well developed, well nourished WF in no acute distress. Head: Normocephalic, atraumatic, sclera non-icteric, no xanthomas, nares are without discharge. Neck: Negative for carotid bruits. JVP not elevated. Lungs: Clear bilaterally to auscultation without wheezes, rales, or rhonchi. Breathing is unlabored. Heart: RRR S1 S2 without murmurs, rubs, or gallops.  Abdomen: Soft, non-tender, non-distended with normoactive bowel sounds. No rebound/guarding. Extremities: No clubbing or cyanosis. No edema. Distal pedal pulses are 2+ and equal bilaterally. R radial with good pulses, no hematoma or complication Neuro: Alert and oriented X 3. Moves all extremities spontaneously. Psych:  Responds to questions appropriately with a normal affect.   Intake/Output Summary (Last 24 hours) at 09/04/13 0656 Last data filed at 09/04/13 0400  Gross per 24 hour  Intake 1038.4 ml  Output    700 ml  Net  338.4 ml    Inpatient Medications:  . aspirin  81 mg Oral Daily  . carvedilol  3.125 mg Oral BID WC  . clopidogrel  75 mg Oral Daily  . losartan  100 mg Oral Daily   And  . hydrochlorothiazide  25 mg Oral Daily  . insulin aspart protamine- aspart  40 Units Subcutaneous Q breakfast  . insulin aspart protamine- aspart  60 Units Subcutaneous Q supper  . levothyroxine  75 mcg Oral Custom  . sodium chloride  3 mL Intravenous Q12H   Infusions:    Labs: Pending this AM  Radiology/Studies:  Dg Chest 2 View  08/31/2013   CLINICAL DATA:  Hypertension.   EXAM: CHEST  2 VIEW  COMPARISON:  Apr 04, 2012.  FINDINGS: The heart size and mediastinal contours are within normal limits. Both lungs are clear. Degenerative changes of mid thoracic spine are noted.  IMPRESSION: No active cardiopulmonary disease.   Electronically Signed   By: Roque Lias M.D.   On: 08/31/2013 11:10     Assessment and Plan  1. CAD/unstable angina s/p PTCA/DES to LAD, medical therapy for residual severe stenosis of a mid-distal left PDA branch of large dominant LCx, moderate RCA disease (consider PCI of L-PDA if she fails med rx). - continue ASA/Plavix for at least 12 months. Continue BB. Add statin.  2. Transient atrial arrhythmia overnight - consider discontinuation of HCTZ component of combo pill in lieu of increasing BB. Will have MD review telemetry regarding further recs. 3. HTN - See above. 4. Diabetes mellitus - cont home insulin regimen. Resume metformin 10/26 AM. 5. Hyperlipidemia - LDL 155 in 07/2013 - add statin. She states she took one in the past but never refilled it, but for no particular reason.  BMET/CBC pending this AM. Anticipate DC this AM if OK.  Signed, Ronie Spies PA-C   Patient seen, examined. Available data reviewed. Agree with findings, assessment, and plan as outlined by Ronie Spies, PA-C. Exam reveals heart RRR, lungs clear, and right radial site clear. Agree with addition of statin - has been written. Tele reviewed with short run of SVT. Continue coreg and other home meds. Follow-up Dr Alwyn Ren and Dr Eden Emms.  Tonny Bollman,  M.D. 09/04/2013 7:26 AM

## 2013-09-04 NOTE — Discharge Summary (Signed)
Discharge Summary   Patient ID: MEREDETH FURBER MRN: [id number], DOB/AGE: 01-21-1941 72 y.o. Admit date: 09/03/2013 D/C date:     09/04/2013  Primary Cardiologist: Eden Emms  Primary Discharge Diagnoses:  1. Newly diagnosed CAD/progressive exertional angina  - s/p PTCA/DES to LAD, medical therapy for residual severe stenosis of a mid-distal left PDA branch of large dominant LCx, moderate RCA disease (consider PCI of L-PDA if she fails med rx)  2. HTN 3. Diabetes mellitus, insulin-dependent 4. Hyperlipidemia  - started on statin - consider f/u labs 6-8 wks 5. Brief isolated SVT  Secondary Discharge Diagnoses:  1. Hypothyroidism 2. Depression 3. Diverticulosis 4. Arthritis  Hospital Course: Ms. Borgwardt is a 72 y/o F with history of HTN, DM, HL who recently presented to her PCP with 6-8 weeks of exertional substernal chest pain radiating to her neck with associated dyspnea. These symptoms eased when she stopped. She was not having any resting pain. She has marked family history of CAD including identical twin sister. Cardiac cath was recommended due to concern for progressive exertional angina and CAD. Dr. Eden Emms started her on Coreg and Plavix and arranged for cath which was performed 09/03/13. This demonstrated: 1. Severe diffuse stenosis of the proximal and mid LAD treated successfully with a single drug-eluting stent.  2. Large, dominant left circumflex with severe stenosis of a mid-distal left PDA branch  3. Small, nondominant RCA with moderate stenosis  4. Normal left ventricular systolic function. The patient should continue with aspirin and Plavix for at least 12 months. We recommend medical therapy for her residual CAD. If she fails medical therapy, consideration could be made for PCI of the left PDA branch but we suspected she would have significant symptomatic improvement with treatment of her proximal LAD stenosis. She tolerated the procedure well. Overnight she did have brief  (<4sec) of transient atrial arrhythmia/SVT but was asymptomatic. She will remain on beta blocker. Today the patient feels well. Statin was initiated as recent LDL in 07/2013 was 155. Dr. Excell Seltzer has seen and examined the patient today and feels she is stable for discharge. I have left a message on our office's scheduling voicemail requesting a follow-up appointment, and our office will call the patient with this appointment.  Discharge Vitals: Blood pressure 137/47, pulse 69, temperature 97.9 F (36.6 C), temperature source Oral, resp. rate 18, height 5\' 4"  (1.626 m), weight 199 lb 4.7 oz (90.4 kg), SpO2 97.00%.  Labs: Lab Results  Component Value Date   WBC 7.6 09/04/2013   HGB 13.3 09/04/2013   HCT 38.8 09/04/2013   MCV 86.2 09/04/2013   PLT 239 09/04/2013     Recent Labs Lab 09/04/13 0640  NA 139  K 3.7  CL 103  CO2 24  BUN 16  CREATININE 0.80  CALCIUM 9.1  GLUCOSE 145*    Lab Results  Component Value Date   CHOL 258* 08/10/2013   HDL 36.70* 08/10/2013   LDLCALC 90 01/02/2008   TRIG 370.0* 08/10/2013   LDL 155 in 07/2013  Diagnostic Studies/Procedures   Cardiac catheterization this admission, please see full report and above for summary.  Dg Chest 2 View 08/31/2013   CLINICAL DATA:  Hypertension.  EXAM: CHEST  2 VIEW  COMPARISON:  Apr 04, 2012.  FINDINGS: The heart size and mediastinal contours are within normal limits. Both lungs are clear. Degenerative changes of mid thoracic spine are noted.  IMPRESSION: No active cardiopulmonary disease.   Electronically Signed   By: Marry Guan.D.  On: 08/31/2013 11:10    Discharge Medications     Medication List         aspirin 81 MG tablet  Take 81 mg by mouth daily.     atorvastatin 40 MG tablet  Commonly known as:  LIPITOR  Take 1 tablet (40 mg total) by mouth every evening.     carvedilol 3.125 MG tablet  Commonly known as:  COREG  Take 1 tablet (3.125 mg total) by mouth 2 (two) times daily.     clopidogrel 75 MG  tablet  Commonly known as:  PLAVIX  Take 75 mg by mouth daily.     levothyroxine 75 MCG tablet  Commonly known as:  SYNTHROID, LEVOTHROID  Take 75 mcg by mouth daily. M-F only     losartan-hydrochlorothiazide 100-25 MG per tablet  Commonly known as:  HYZAAR  Take 1 tablet by mouth daily.     metFORMIN 500 MG tablet  Commonly known as:  GLUCOPHAGE  Take 1 tablet (500 mg total) by mouth daily.  Start taking on:  09/06/2013     MULTIPLE VITAMIN PO  Take 1 tablet by mouth daily.     nitroGLYCERIN 0.4 MG SL tablet  Commonly known as:  NITROSTAT  Place 1 tablet (0.4 mg total) under the tongue every 5 (five) minutes as needed for chest pain.     NOVOLOG MIX 70/30 FLEXPEN (70-30) 100 UNIT/ML Supn  Generic drug:  Insulin Aspart Prot & Aspart  Inject 40-60 Units into the skin 2 (two) times daily before lunch and supper. Take 40 units at breakfast and 60 units at supper.        Disposition   The patient will be discharged in stable condition to home. Discharge Orders   Future Orders Complete By Expires   Diet - low sodium heart healthy  As directed    Scheduling Instructions:     Diabetic Diet   Discharge instructions  As directed    Comments:     Do not restart Metformin until the morning of 09/06/13.  The plan is for you to continue Aspirin and Plavix for at least 12 months.   Increase activity slowly  As directed    Comments:     No driving for 2 days. No lifting over 5 lbs for 1 week. No sexual activity for 1 week. Keep procedure site clean & dry. If you notice increased pain, swelling, bleeding or pus, call/return!  You may shower, but no soaking baths/hot tubs/pools for 1 week.     Follow-up Information   Follow up with Charlton Haws, MD. (Our office will call you for a follow-up appointment. Please call the office if you have not heard from [place] within 3 business days.)    Specialty:  Cardiology   Contact information:   [address] [place]  [address] [phone]         Duration of Discharge Encounter: Greater than 30 minutes including physician and PA time.  Signed, Ronie Spies PA-C 09/04/2013, 7:55 AM

## 2013-09-17 ENCOUNTER — Encounter (HOSPITAL_COMMUNITY)
Admission: RE | Admit: 2013-09-17 | Discharge: 2013-09-17 | Disposition: A | Discharge status: Home / Self Care | Payer: Medicare Other | Source: Ambulatory Visit | Attending: Cardiovascular Disease | Admitting: Cardiovascular Disease

## 2013-09-17 DIAGNOSIS — I2510 Atherosclerotic heart disease of native coronary artery without angina pectoris: Secondary | ICD-10-CM | POA: Insufficient documentation

## 2013-09-17 DIAGNOSIS — I10 Essential (primary) hypertension: Secondary | ICD-10-CM | POA: Insufficient documentation

## 2013-09-17 DIAGNOSIS — Z9861 Coronary angioplasty status: Secondary | ICD-10-CM | POA: Insufficient documentation

## 2013-09-17 DIAGNOSIS — I200 Unstable angina: Secondary | ICD-10-CM | POA: Insufficient documentation

## 2013-09-17 DIAGNOSIS — E119 Type 2 diabetes mellitus without complications: Secondary | ICD-10-CM | POA: Insufficient documentation

## 2013-09-17 DIAGNOSIS — Z5189 Encounter for other specified aftercare: Secondary | ICD-10-CM | POA: Insufficient documentation

## 2013-09-17 DIAGNOSIS — Z8249 Family history of ischemic heart disease and other diseases of the circulatory system: Secondary | ICD-10-CM | POA: Insufficient documentation

## 2013-09-17 NOTE — Progress Notes (Signed)
Cardiac Rehab Medication Review by a Pharmacist  Does the patient  feel that his/her medications are working for him/her?  yes  Has the patient been experiencing any side effects to the medications prescribed?  no  Does the patient measure his/her own blood pressure or blood glucose at home?  yes   Does the patient have any problems obtaining medications due to transportation or finances?   no  Understanding of regimen: excellent Understanding of indications: excellent Potential of compliance: excellent    Pharmacist comments:  59 yoF who presents in good spirits with her husband this morning. She keeps a list of her medications on her iphone. Recently started on plavix s/p stent placement. I have updated all of her allergies and medications. She does complain of a recent cough after starting her aspirin and plavix together, though I do not think this is related. The only medication on her list that might be of attributing value could be losartan-HCT. However, this is not likely as it does not seem to effect cough as much as ACE inhibitors. Also, patient has been on this medication historically, causing me to believe this could be a temporary complaint. I have educated patient to observe with cough drops for a few more days. If it continues to persist, patient encouraged to mention it at her next doctor's visit.   Thank you for allowing me to take part in the care of this patient,  Britt Bottom B. Artelia Laroche, PharmD Clinical Pharmacist - Resident Pager: [phone] Phone: [phone] 09/17/2013 9:04 AM

## 2013-09-21 ENCOUNTER — Encounter (HOSPITAL_COMMUNITY): Payer: Medicare Other

## 2013-09-21 ENCOUNTER — Encounter: Payer: Self-pay | Admitting: Cardiovascular Disease

## 2013-09-21 ENCOUNTER — Ambulatory Visit (INDEPENDENT_AMBULATORY_CARE_PROVIDER_SITE_OTHER): Payer: Medicare Other | Admitting: Cardiovascular Disease

## 2013-09-21 VITALS — BP 139/68 | HR 64 | Wt 201.0 lb

## 2013-09-21 DIAGNOSIS — IMO0001 Reserved for inherently not codable concepts without codable children: Secondary | ICD-10-CM

## 2013-09-21 DIAGNOSIS — I10 Essential (primary) hypertension: Secondary | ICD-10-CM

## 2013-09-21 DIAGNOSIS — I2510 Atherosclerotic heart disease of native coronary artery without angina pectoris: Secondary | ICD-10-CM

## 2013-09-21 DIAGNOSIS — I498 Other specified cardiac arrhythmias: Secondary | ICD-10-CM

## 2013-09-21 DIAGNOSIS — I471 Supraventricular tachycardia: Secondary | ICD-10-CM

## 2013-09-21 NOTE — Assessment & Plan Note (Signed)
Continue ARB for now suspect cough is from allergies

## 2013-09-21 NOTE — Assessment & Plan Note (Signed)
Discussed low carb diet.  Target hemoglobin A1c is 6.5 or less.  Continue current medications.  

## 2013-09-21 NOTE — Progress Notes (Signed)
Patient ID: Danielle Murray, female   DOB: [birth year], 72 y.o.   MRN: [id number] with f/u for CAD.  Cath 09/01/13 by Dr Excell Seltzer reviewed.  Two vessel disease proximal/mid LAD and left dominant PDA.  Stent to LAD and medical Rx for residual  Final Conclusions:  1. Severe diffuse stenosis of the proximal and mid LAD treated successfully with a single drug-eluting stent.  2. Large, dominant left circumflex with severe stenosis of a mid-distal left PDA branch  3. Small, nondominant RCA with moderate stenosis  4. Normal left ventricular systolic function.  Recommendations: The patient should continue with aspirin and Plavix for at least 12 months. Recommend medical therapy for her residual CAD. If she fails medical therapy, consideration could be made for PCI of the left PDA branch. I suspect she will have significant symptomatic improvement with treatment of her proximal LAD stenosis.   Reviewed films with patient PDA would be lucky to take a 2.0 stent No chest pain Stable Wrist feels good.  Mild dry cough Dont think it is from ARB   ROS: Denies fever, malais, weight loss, blurry vision, decreased visual acuity, cough, sputum, SOB, hemoptysis, pleuritic pain, palpitaitons, heartburn, abdominal pain, melena, lower extremity edema, claudication, or rash.  All other systems reviewed and negative  General: Affect appropriate Healthy:  appears stated age HEENT: normal Neck supple with no adenopathy JVP normal no bruits no thyromegaly Lungs clear with no wheezing and good diaphragmatic motion Heart:  S1/S2 no murmur, no rub, gallop or click PMI normal Abdomen: benighn, BS positve, no tenderness, no AAA no bruit.  No HSM or HJR Distal pulses intact with no bruits No edema Neuro non-focal Skin warm and dry No muscular weakness   Current Outpatient Prescriptions  Medication Sig Dispense Refill  . aspirin 81 MG tablet Take 81 mg by mouth daily.      Marland Kitchen atorvastatin (LIPITOR) 40 MG tablet Take 1  tablet (40 mg total) by mouth every evening.  30 tablet  6  . carvedilol (COREG) 3.125 MG tablet Take 1 tablet (3.125 mg total) by mouth 2 (two) times daily.  180 tablet  3  . clopidogrel (PLAVIX) 75 MG tablet Take 75 mg by mouth daily.      . Insulin Aspart Prot & Aspart (NOVOLOG MIX 70/30 FLEXPEN) (70-30) 100 UNIT/ML SUPN Inject 40-60 Units into the skin 2 (two) times daily before lunch and supper. Take 40 units at breakfast and 60 units at supper.      . levothyroxine (SYNTHROID, LEVOTHROID) 75 MCG tablet Take 75 mcg by mouth daily. M-F only      . losartan-hydrochlorothiazide (HYZAAR) 100-25 MG per tablet Take 1 tablet by mouth daily.      . metFORMIN (GLUCOPHAGE) 500 MG tablet Take 1 tablet (500 mg total) by mouth daily.      . MULTIPLE VITAMIN PO Take 1 tablet by mouth daily.      . nitroGLYCERIN (NITROSTAT) 0.4 MG SL tablet Place 1 tablet (0.4 mg total) under the tongue every 5 (five) minutes as needed for chest pain.  25 tablet  3   No current facility-administered medications for this visit.    Allergies  Penicillins  Electrocardiogram:  10/25 SR rate 67 low voltage otherwise normal  Assessment and Plan

## 2013-09-21 NOTE — Assessment & Plan Note (Signed)
DES to LAD DAT for a year Residual left dominant PDA disease in small vessel Medical Rx has nitro

## 2013-09-21 NOTE — Patient Instructions (Signed)
Your physician recommends that you schedule a follow-up appointment in:  3 MONTHS WITH  DR NISHAN  Your physician recommends that you continue on your current medications as directed. Please refer to the Current Medication list given to you today.  

## 2013-09-21 NOTE — Assessment & Plan Note (Signed)
No clinical recurrence continue low dose beta blocker given CAD  Can increase dose in future if needed

## 2013-09-23 ENCOUNTER — Encounter (HOSPITAL_COMMUNITY): Payer: Medicare Other

## 2013-09-23 ENCOUNTER — Encounter (HOSPITAL_COMMUNITY)
Admission: RE | Admit: 2013-09-23 | Discharge: 2013-09-23 | Disposition: A | Discharge status: Home / Self Care | Payer: Medicare Other | Source: Ambulatory Visit | Attending: Cardiovascular Disease | Admitting: Cardiovascular Disease

## 2013-09-25 ENCOUNTER — Encounter (HOSPITAL_COMMUNITY)
Admission: RE | Admit: 2013-09-25 | Discharge: 2013-09-25 | Disposition: A | Discharge status: Home / Self Care | Payer: Medicare Other | Source: Ambulatory Visit | Attending: Cardiovascular Disease | Admitting: Cardiovascular Disease

## 2013-09-25 ENCOUNTER — Encounter (HOSPITAL_COMMUNITY): Payer: Medicare Other

## 2013-09-25 LAB — GLUCOSE, CAPILLARY: Glucose-Capillary: 133 mg/dL — ABNORMAL HIGH (ref 70–99)

## 2013-09-28 ENCOUNTER — Encounter (HOSPITAL_COMMUNITY): Payer: Medicare Other

## 2013-09-28 ENCOUNTER — Telehealth (HOSPITAL_COMMUNITY): Payer: Self-pay | Admitting: Internal Medicine

## 2013-09-30 ENCOUNTER — Encounter (HOSPITAL_COMMUNITY): Payer: Medicare Other

## 2013-09-30 ENCOUNTER — Encounter (HOSPITAL_COMMUNITY)
Admission: RE | Admit: 2013-09-30 | Discharge: 2013-09-30 | Disposition: A | Discharge status: Home / Self Care | Payer: Medicare Other | Source: Ambulatory Visit | Attending: Cardiovascular Disease | Admitting: Cardiovascular Disease

## 2013-09-30 LAB — GLUCOSE, CAPILLARY: Glucose-Capillary: 103 mg/dL — ABNORMAL HIGH (ref 70–99)

## 2013-09-30 NOTE — Progress Notes (Signed)
Pt returned to cardiac rehab today after absence for her twin sister's death. Pt reports this has been difficult for her as she and her sister were very close, sharing a special bond as twins. Pt quality of life reviewed.  Pt reports overall dissatisfaction with her health and energy.  She attributes both of these to her heart disease, as she started to decline in September. Pt is motivated to work diligently to improve her risk factors to honor her sister. Pt is also greatly upset about her weight and lack of diabetic control.  Pt feels discouraged as she feels she works diligently to improve both without success.  Will refer to Mickle Plumb, RD for weight loss and glycemic control menu planning.  Will continue to monitor the patient throughout  the program.

## 2013-10-02 ENCOUNTER — Encounter (HOSPITAL_COMMUNITY): Payer: Medicare Other

## 2013-10-02 ENCOUNTER — Encounter (HOSPITAL_COMMUNITY)
Admission: RE | Admit: 2013-10-02 | Discharge: 2013-10-02 | Disposition: A | Discharge status: Home / Self Care | Payer: Medicare Other | Source: Ambulatory Visit | Attending: Cardiovascular Disease | Admitting: Cardiovascular Disease

## 2013-10-02 LAB — GLUCOSE, CAPILLARY: Glucose-Capillary: 102 mg/dL — ABNORMAL HIGH (ref 70–99)

## 2013-10-05 ENCOUNTER — Encounter (HOSPITAL_COMMUNITY): Payer: Medicare Other

## 2013-10-05 ENCOUNTER — Encounter (HOSPITAL_COMMUNITY)
Admission: RE | Admit: 2013-10-05 | Discharge: 2013-10-05 | Disposition: A | Discharge status: Home / Self Care | Payer: Medicare Other | Source: Ambulatory Visit | Attending: Cardiovascular Disease | Admitting: Cardiovascular Disease

## 2013-10-05 LAB — GLUCOSE, CAPILLARY: Glucose-Capillary: 115 mg/dL — ABNORMAL HIGH (ref 70–99)

## 2013-10-07 ENCOUNTER — Encounter (HOSPITAL_COMMUNITY): Payer: Medicare Other

## 2013-10-07 ENCOUNTER — Encounter (HOSPITAL_COMMUNITY)
Admission: RE | Admit: 2013-10-07 | Discharge: 2013-10-07 | Disposition: A | Discharge status: Home / Self Care | Payer: Medicare Other | Source: Ambulatory Visit | Attending: Cardiovascular Disease | Admitting: Cardiovascular Disease

## 2013-10-07 NOTE — Progress Notes (Signed)
Reviewed home exercise with pt today.  Pt plans to walk for 15 minutes, 2 time/day until she can gradually increase her time to a full 30 minutes.  She will exercise 2-4 days outside of CRP II, with an additional day of hand weights For exercise.  Reviewed THR, pulse, RPE, sign and symptoms, NTG use, and when to call 911 or MD.  Pt voiced understanding.  Alexia Freestone, MS, ACSM RCEP 4:20 PM 10/07/2013

## 2013-10-09 ENCOUNTER — Encounter (HOSPITAL_COMMUNITY): Payer: Medicare Other

## 2013-10-12 ENCOUNTER — Encounter (HOSPITAL_COMMUNITY): Payer: Medicare Other

## 2013-10-12 ENCOUNTER — Encounter (HOSPITAL_COMMUNITY)
Admission: RE | Admit: 2013-10-12 | Discharge: 2013-10-12 | Disposition: A | Discharge status: Home / Self Care | Payer: Medicare Other | Source: Ambulatory Visit | Attending: Cardiovascular Disease | Admitting: Cardiovascular Disease

## 2013-10-12 DIAGNOSIS — I200 Unstable angina: Secondary | ICD-10-CM | POA: Insufficient documentation

## 2013-10-12 DIAGNOSIS — I10 Essential (primary) hypertension: Secondary | ICD-10-CM | POA: Insufficient documentation

## 2013-10-12 DIAGNOSIS — Z8249 Family history of ischemic heart disease and other diseases of the circulatory system: Secondary | ICD-10-CM | POA: Insufficient documentation

## 2013-10-12 DIAGNOSIS — Z9861 Coronary angioplasty status: Secondary | ICD-10-CM | POA: Insufficient documentation

## 2013-10-12 DIAGNOSIS — I2510 Atherosclerotic heart disease of native coronary artery without angina pectoris: Secondary | ICD-10-CM | POA: Insufficient documentation

## 2013-10-12 DIAGNOSIS — Z5189 Encounter for other specified aftercare: Secondary | ICD-10-CM | POA: Insufficient documentation

## 2013-10-12 DIAGNOSIS — E119 Type 2 diabetes mellitus without complications: Secondary | ICD-10-CM | POA: Insufficient documentation

## 2013-10-13 ENCOUNTER — Encounter: Payer: Self-pay | Admitting: Cardiovascular Disease

## 2013-10-14 ENCOUNTER — Encounter (HOSPITAL_COMMUNITY)
Admission: RE | Admit: 2013-10-14 | Discharge: 2013-10-14 | Disposition: A | Discharge status: Home / Self Care | Payer: Medicare Other | Source: Ambulatory Visit | Attending: Cardiovascular Disease | Admitting: Cardiovascular Disease

## 2013-10-14 ENCOUNTER — Encounter (HOSPITAL_COMMUNITY): Payer: Medicare Other

## 2013-10-14 LAB — GLUCOSE, CAPILLARY: Glucose-Capillary: 117 mg/dL — ABNORMAL HIGH (ref 70–99)

## 2013-10-16 ENCOUNTER — Encounter (HOSPITAL_COMMUNITY): Payer: Medicare Other

## 2013-10-16 ENCOUNTER — Encounter (HOSPITAL_COMMUNITY)
Admission: RE | Admit: 2013-10-16 | Discharge: 2013-10-16 | Disposition: A | Discharge status: Home / Self Care | Payer: Medicare Other | Source: Ambulatory Visit | Attending: Cardiovascular Disease | Admitting: Cardiovascular Disease

## 2013-10-19 ENCOUNTER — Encounter (HOSPITAL_COMMUNITY): Payer: Medicare Other

## 2013-10-19 ENCOUNTER — Encounter (HOSPITAL_COMMUNITY)
Admission: RE | Admit: 2013-10-19 | Discharge: 2013-10-19 | Disposition: A | Discharge status: Home / Self Care | Payer: Medicare Other | Source: Ambulatory Visit | Attending: Cardiovascular Disease | Admitting: Cardiovascular Disease

## 2013-10-21 ENCOUNTER — Encounter (HOSPITAL_COMMUNITY): Payer: Medicare Other

## 2013-10-21 ENCOUNTER — Encounter (HOSPITAL_COMMUNITY)
Admission: RE | Admit: 2013-10-21 | Discharge: 2013-10-21 | Disposition: A | Discharge status: Home / Self Care | Payer: Medicare Other | Source: Ambulatory Visit | Attending: Cardiovascular Disease | Admitting: Cardiovascular Disease

## 2013-10-21 LAB — GLUCOSE, CAPILLARY
Glucose-Capillary: 262 mg/dL — ABNORMAL HIGH (ref 70–99)
Glucose-Capillary: 238 mg/dL — ABNORMAL HIGH (ref 70–99)

## 2013-10-23 ENCOUNTER — Encounter (HOSPITAL_COMMUNITY)
Admission: RE | Admit: 2013-10-23 | Discharge: 2013-10-23 | Disposition: A | Discharge status: Home / Self Care | Payer: Medicare Other | Source: Ambulatory Visit | Attending: Cardiovascular Disease | Admitting: Cardiovascular Disease

## 2013-10-23 ENCOUNTER — Encounter (HOSPITAL_COMMUNITY): Payer: Medicare Other

## 2013-10-23 NOTE — Progress Notes (Signed)
Pt hypertensive with exercise today.  Peak BP-1776/78 down to 136/68 with rest during cool down period. Pt asymptomatic and denies missed med doses or change in routine today.  Pt hyperglycemic on 10/21/13 due to holding her am insulin in response to hypoglycemic event at 6am.  Pt states she treated the low blood sugar with egg nog, then a cookie to get it to adequately rise.  Pt states she then held her am insulin.  Pt did not contact Dr. Talmage Nap for further advice.  Pt CBG-97 today pre exercise and 110 post exercise.  Pt was given lemonade to hold glucose level during exercise. Rehab Report faxed to Dr. Talmage Nap to notify her of this weeks events.  Pt advised to continue current regimen. Pt also instructed be to treat CBG<70 with carbohydrate without protein (specific carbohydrate choices given to pt) until CBG >70 at which time appropriate to add protein to maintain glucose level.  Pt verbalized understanding.

## 2013-10-26 ENCOUNTER — Encounter (HOSPITAL_COMMUNITY)
Admission: RE | Admit: 2013-10-26 | Discharge: 2013-10-26 | Disposition: A | Discharge status: Home / Self Care | Payer: Medicare Other | Source: Ambulatory Visit | Attending: Cardiovascular Disease | Admitting: Cardiovascular Disease

## 2013-10-26 ENCOUNTER — Encounter (HOSPITAL_COMMUNITY): Payer: Medicare Other

## 2013-10-26 LAB — GLUCOSE, CAPILLARY: Glucose-Capillary: 104 mg/dL — ABNORMAL HIGH (ref 70–99)

## 2013-10-26 NOTE — Progress Notes (Signed)
Danielle Murray 72 y.o. female Nutrition Note Spoke with pt.  Nutrition Plan and Nutrition Survey goals reviewed with pt. Pt is following Step 1 of the Therapeutic Lifestyle Changes diet. Pt wants to lose wt. Pt has not been actively trying to lose wt. Pt reports her appetite waxes and wanes as she is trying to cope with the sudden loss of her twin sister in November. Wt loss tips reviewed. Pt is diabetic. Pt checks fasting CBG's q am. Last A1c indicates blood glucose poorly controlled. This Clinical research associate went over Diabetes Education test results. Pt expressed understanding of the information reviewed. Pt aware of nutrition education classes offered and plans on attending nutrition classes.  Nutrition Diagnosis   Food-and nutrition-related knowledge deficit related to lack of exposure to information as related to diagnosis of: ? CVD ? DM (A1c 8.2)    Obesity related to excessive energy intake as evidenced by a BMI of 33.6  Nutrition RX/ Estimated Daily Nutrition Needs for: wt loss  1200-1700 Kcal, 30-45 gm fat, 8-13 gm sat fat, 1.1-1.7 gm trans-fat, <1500 mg sodium, 150-175 gm CHO   Nutrition Intervention   Pt's individual nutrition plan reviewed with pt.   Benefits of adopting Therapeutic Lifestyle Changes discussed when Medficts reviewed.   Pt to attend the Portion Distortion class   Pt to attend the  ? Nutrition I class                          ? Nutrition II class          ? Diabetes Blitz class       ? Diabetes Q & A class   Pt given handouts for: ? 1500 kcal, 5 day menu ideas   Continue client-centered nutrition education by RD, as part of interdisciplinary care. Goal(s)   Pt to identify and limit food sources of saturated fat, trans fat, and cholesterol   Pt to identify food quantities necessary to achieve: ? wt loss to a goal wt of 178-196 lb (80.8-89 kg) at graduation from cardiac rehab.    CBG concentrations in the normal range or as close to normal as is safely possible. Monitor and  Evaluate progress toward nutrition goal with team.  Mickle Plumb, M.Ed, RD, LDN, CDE 10/26/2013 3:01 PM

## 2013-10-28 ENCOUNTER — Ambulatory Visit (INDEPENDENT_AMBULATORY_CARE_PROVIDER_SITE_OTHER): Payer: Medicare Other | Admitting: Nurse Practitioner

## 2013-10-28 ENCOUNTER — Ambulatory Visit (HOSPITAL_COMMUNITY)
Admission: RE | Admit: 2013-10-28 | Discharge: 2013-10-28 | Disposition: A | Discharge status: Home / Self Care | Payer: Medicare Other | Source: Ambulatory Visit | Attending: Internal Medicine | Admitting: Internal Medicine

## 2013-10-28 ENCOUNTER — Encounter: Payer: Self-pay | Admitting: Nurse Practitioner

## 2013-10-28 ENCOUNTER — Telehealth: Payer: Self-pay | Admitting: *Deleted

## 2013-10-28 ENCOUNTER — Encounter (HOSPITAL_COMMUNITY): Payer: Medicare Other

## 2013-10-28 ENCOUNTER — Encounter (HOSPITAL_COMMUNITY)
Admission: RE | Admit: 2013-10-28 | Discharge: 2013-10-28 | Disposition: A | Discharge status: Home / Self Care | Payer: Medicare Other | Source: Ambulatory Visit | Attending: Cardiovascular Disease | Admitting: Cardiovascular Disease

## 2013-10-28 VITALS — BP 180/80 | HR 67 | Ht 64.0 in | Wt 199.8 lb

## 2013-10-28 DIAGNOSIS — I2510 Atherosclerotic heart disease of native coronary artery without angina pectoris: Secondary | ICD-10-CM

## 2013-10-28 DIAGNOSIS — Z79899 Other long term (current) drug therapy: Secondary | ICD-10-CM | POA: Insufficient documentation

## 2013-10-28 DIAGNOSIS — R079 Chest pain, unspecified: Secondary | ICD-10-CM

## 2013-10-28 DIAGNOSIS — I10 Essential (primary) hypertension: Secondary | ICD-10-CM | POA: Insufficient documentation

## 2013-10-28 DIAGNOSIS — E119 Type 2 diabetes mellitus without complications: Secondary | ICD-10-CM | POA: Insufficient documentation

## 2013-10-28 DIAGNOSIS — E785 Hyperlipidemia, unspecified: Secondary | ICD-10-CM | POA: Insufficient documentation

## 2013-10-28 DIAGNOSIS — E039 Hypothyroidism, unspecified: Secondary | ICD-10-CM | POA: Insufficient documentation

## 2013-10-28 MED ORDER — AMLODIPINE BESYLATE 5 MG PO TABS: 5.0000 mg | ORAL_TABLET | Freq: Every day | ORAL | Status: DC

## 2013-10-28 NOTE — Telephone Encounter (Signed)
CARDIAC REHAB  CALLED PT  COMPLAINING  OF  CHEST TIGHTNESS  WITH RELIEF  NOTED  AT  [facility]  [facility]  DR NISHAN   FOR  RECOMMENDATIONS./CY

## 2013-10-28 NOTE — Patient Instructions (Addendum)
Stay on your current medicines but we need to add Norvasc 5 mg each day  May return to cardiac rehab on Monday as long as you are feeling better  Ok to resume your walking at home this weekend  Call the [facility] office at [phone] if you have any questions, problems or concerns.

## 2013-10-28 NOTE — Telephone Encounter (Signed)
Danielle Murray with cardiac rehab called and has spoken with Danielle Murray. Chest pain has subsided but he does want patient seen ASAP. Scheduled ov with Dawayne Patricia NP and advised to have patient come to office now. If chest pain returns she needs to go to ED.

## 2013-10-28 NOTE — Progress Notes (Signed)
Danielle Murray Date of Birth: 1941-03-01 Medical Record #[id number]  History of Present Illness: Danielle Murray is seen back today for a work in visit. Seen for Dr. Eden Emms. She has known CAD with past PCI. Other issues include HLD, IDDM and HTN. She was cathed by Dr. Excell Seltzer back in October had 2 vessel disease with proximal/mid LAD and left dominant PD. Treated with stent to the LAD and medical therapy for her residual disease - Dr. Eden Emms reviewed her films - apparently the PD is quite small - would be "lucky to take a 2.0 stent".  Last seen here a month ago - seemed to be doing ok.  Call from cardiac rehab earlier today - having chest pain. Thus added to my schedule today.   Comes in today. Here with her husband. She notes that she has done fine since her cath. No spells of chest discomfort until today. She was on her first machine - was able to complete the entire 10 minute work out but had chest discomfort after about 5 minutes. Sat down. Symptoms resolved with rest. BP then 150/60. PACs noted on telemetry. Did not need NTG. Feels fine now and has had no recurrence. She has been walking 30 minutes on her non cardiac rehab days without incident.   Current Outpatient Prescriptions  Medication Sig Dispense Refill  . aspirin 81 MG tablet Take 81 mg by mouth daily.      Marland Kitchen atorvastatin (LIPITOR) 40 MG tablet Take 1 tablet (40 mg total) by mouth every evening.  30 tablet  6  . carvedilol (COREG) 3.125 MG tablet Take 1 tablet (3.125 mg total) by mouth 2 (two) times daily.  180 tablet  3  . clopidogrel (PLAVIX) 75 MG tablet Take 75 mg by mouth daily.      . Insulin Aspart Prot & Aspart (NOVOLOG MIX 70/30 FLEXPEN) (70-30) 100 UNIT/ML SUPN Inject 40-60 Units into the skin 2 (two) times daily before lunch and supper. Take 40 units at breakfast and 60 units at supper.      . levothyroxine (SYNTHROID, LEVOTHROID) 75 MCG tablet Take 75 mcg by mouth daily. M-F only      . losartan-hydrochlorothiazide  (HYZAAR) 100-25 MG per tablet Take 1 tablet by mouth daily.      . metFORMIN (GLUCOPHAGE) 500 MG tablet Take 1 tablet (500 mg total) by mouth daily.      . MULTIPLE VITAMIN PO Take 1 tablet by mouth daily.      . nitroGLYCERIN (NITROSTAT) 0.4 MG SL tablet Place 1 tablet (0.4 mg total) under the tongue every 5 (five) minutes as needed for chest pain.  25 tablet  3   No current facility-administered medications for this visit.    Allergies  Allergen Reactions  . Penicillins     Diffuse joint pain @ age 53 in context of Scarlet Fever    Past Medical History  Diagnosis Date  . Hypertension   . Hyperlipidemia   . HYPOTHYROIDISM   . DEPRESSION   . Diverticulosis   . Coronary artery disease     a. 08/2013: unstable angina s/p PTCA/DES to LAD, medical therapy for residual severe stenosis of a mid-distal left PDA branch of large dominant LCx, moderate RCA disease (consider PCI of L-PDA if she fails med rx).  . Type II diabetes mellitus     Dr Talmage Nap  . Arthritis     "fingers, all my joints" (09/03/2013)  . SVT (supraventricular tachycardia)     a. very  brief transient SVT during 08/2013 admission overnight.    Past Surgical History  Procedure Laterality Date  . Total knee arthroplasty Left 06/2005    Dr Darrelyn Hillock  . Colonoscopy  2003    Tics  . Abdominal hysterectomy  1989    no BSO; dysfunctional menses  . Cataract extraction w/ intraocular lens  implant, bilateral Bilateral 2014  . Coronary angioplasty with stent placement  09/03/2013    "1" (09/03/2013)    History  Smoking status  . Never Smoker   Smokeless tobacco  . Never Used    History  Alcohol Use  . Yes    Comment: 09/03/2013 "1-2 times/yr"    Family History  Problem Relation Age of Onset  . Asthma Mother   . Hypertension Mother   . Heart disease Mother   . Hypertension Father   . Stroke Father 30  . Heart attack Father 14  . Hypertension Sister   . Hyperlipidemia Sister   . Heart attack Sister     pre  26; Twin  . Diabetes Sister     her TWIN  . Hypertension Brother   . Hyperlipidemia Brother   . Coronary artery disease Brother     stents late 50s    Review of Systems: The review of systems is per the HPI.  All other systems were reviewed and are negative.  Physical Exam: BP 180/80  Pulse 67  Ht 5\' 4"  (1.626 m)  Wt 199 lb 12.8 oz (90.629 kg)  BMI 34.28 kg/m2 Repeat BP by me is 160/80 in both arms. Patient is very pleasant and in no acute distress. Skin is warm and dry. Color is normal.  HEENT is unremarkable. Normocephalic/atraumatic. PERRL. Sclera are nonicteric. Neck is supple. No masses. No JVD. Lungs are clear. Cardiac exam shows a regular rate and rhythm. HR is 60. Abdomen is soft. Extremities are without edema. Gait and ROM are intact. No gross neurologic deficits noted.  LABORATORY DATA: EKG shows sinus rhythm with PACs  Lab Results  Component Value Date   WBC 7.6 09/04/2013   HGB 13.3 09/04/2013   HCT 38.8 09/04/2013   PLT 239 09/04/2013   GLUCOSE 145* 09/04/2013   CHOL 258* 08/10/2013   TRIG 370.0* 08/10/2013   HDL 36.70* 08/10/2013   LDLDIRECT 155.1 08/10/2013   LDLCALC 90 01/02/2008   ALT 18 08/10/2013   AST 24 08/10/2013   NA 139 09/04/2013   K 3.7 09/04/2013   CL 103 09/04/2013   CREATININE 0.80 09/04/2013   BUN 16 09/04/2013   CO2 24 09/04/2013   TSH 1.56 08/10/2013   INR 1.0 08/31/2013   HGBA1C 8.2* 08/12/2013   MICROALBUR 0.2 05/07/2008   Procedural Details: The right wrist was prepped, draped, and anesthetized with 1% lidocaine. Using the modified Seldinger technique, a 5/6 French sheath was introduced into the right radial artery. 3 mg of verapamil was administered through the sheath, weight-based unfractionated heparin was administered intravenously. Standard Judkins catheters were used for selective coronary angiography and left ventriculography. Catheter exchanges were performed over an exchange length guidewire. There were no immediate procedural  complications. A TR band was used for radial hemostasis at the completion of the procedure. The patient was transferred to the post catheterization recovery area for further monitoring.  Procedural Findings:  Hemodynamics:  AO 172/61  LV 169/15  Coronary angiography:  Coronary dominance: right  Left mainstem: Arises from the left cusp. There is mild calcification. The left main divides into the LAD and left  circumflex.  Left anterior descending (LAD): The LAD has severe proximal vessel disease. There is mild calcification. The proximal LAD has 80% stenosis. Just after the first diagonal there is 95% stenosis. The diagonal branch is large in caliber and has no significant disease. The mid and distal LAD have mild diffuse disease in the LAD terminates in the distal anterior wall.  Left circumflex (LCx): The left circumflex is a large, dominant vessel. The proximal vessel has 30-40% stenosis. The mid vessel is widely patent with mild nonobstructive disease. The first obtuse marginal branch is patent with diffuse 80% stenosis but this vessel is quite small in caliber. The circumflex gives off 2 left posterolateral branches and a left PDA branch. The PLA branches are patent and the PDA branch has diffuse mid to distal 90% stenosis.  Right coronary artery (RCA): The RCA is small and nondominant. There is proximal 50-70% stenosis.  Left ventriculography: Left ventricular systolic function is normal, LVEF is estimated at 65%, there is no significant mitral regurgitation  PCI note:  Following the diagnostic procedure, the decision was made to proceed with PCI of the proximal LAD where there was severe 95% stenosis. I initially tried to pass a 6 Fr guide but this would not pass through the forearm even with additional NTG and verapamil. A 5 Fr EBU guide was used. Heparin was used for anticoagulation. The patient had been preloaded with ASA and plavix. Once a therapeutic ACT was obtained, a cougar wire was  advanced past the lesion. The lesion was predilated with a 2.5 x 15 mm balloon on 2 inflations. The lesion was then stented with a 2.5x24 mm Promus premier DES. The stent was then post-dilated with a 2.75 [place] balloon to 18 atmospheres. There was an excellent result with 0% residual stenosis and TIMI-3 flow.  PCI Lesion Data:  Lesion site: prox LAD  Lesion % stenosis: 95  TIMI flow: 3  Stent: 2.5x24 mm Promus Premier DES  Post-stenosis: 0%  Post-TIMI flow: 3   Final Conclusions:  1. Severe diffuse stenosis of the proximal and mid LAD treated successfully with a single drug-eluting stent.  2. Large, dominant left circumflex with severe stenosis of a mid-distal left PDA branch  3. Small, nondominant RCA with moderate stenosis  4. Normal left ventricular systolic function.  Recommendations: The patient should continue with aspirin and Plavix for at least 12 months. Recommend medical therapy for her residual CAD. If she fails medical therapy, consideration could be made for PCI of the left PDA branch. I suspect she will have significant symptomatic improvement with treatment of her proximal LAD stenosis.  Tonny Bollman  09/03/2013, 2:39 PM   Assessment / Plan: 1. Chest pain - known CAD with stenting of the proximal and mid LAD with DES - committed to DAPT for at least one year - probably longer - has residual disease - to try and manage medically - now with chest discomfort with exercise today - lone episode - BP is up as well - adding Norvasc.   2. HTN - BP is up - some elevated readings from rehab noted. Norvasc is added today.   Will add the Norvasc. She may return to cardiac rehab on Monday the 22nd if she does well. Ok to resume her walking at home this weekend. If symptoms persist, could add nitrate therapy, even Ranexa. I do not think her heart rate will tolerate an increase in her beta blocker.   Patient is agreeable to this plan and will call  if any problems develop in the interim.    Rosalio Macadamia, RN, ANP-C [facility] [address] [place], [place]  [address]

## 2013-10-29 LAB — GLUCOSE, CAPILLARY: Glucose-Capillary: 91 mg/dL (ref 70–99)

## 2013-10-30 ENCOUNTER — Encounter (HOSPITAL_COMMUNITY): Payer: Medicare Other

## 2013-11-02 ENCOUNTER — Encounter (HOSPITAL_COMMUNITY)
Admission: RE | Admit: 2013-11-02 | Discharge: 2013-11-02 | Disposition: A | Discharge status: Home / Self Care | Payer: Medicare Other | Source: Ambulatory Visit | Attending: Cardiovascular Disease | Admitting: Cardiovascular Disease

## 2013-11-02 ENCOUNTER — Encounter (HOSPITAL_COMMUNITY): Payer: Medicare Other

## 2013-11-02 LAB — GLUCOSE, CAPILLARY: Glucose-Capillary: 96 mg/dL (ref 70–99)

## 2013-11-04 ENCOUNTER — Encounter (HOSPITAL_COMMUNITY): Payer: Medicare Other

## 2013-11-04 ENCOUNTER — Encounter (HOSPITAL_COMMUNITY)
Admission: RE | Admit: 2013-11-04 | Discharge: 2013-11-04 | Disposition: A | Discharge status: Home / Self Care | Payer: Medicare Other | Source: Ambulatory Visit | Attending: Cardiovascular Disease | Admitting: Cardiovascular Disease

## 2013-11-06 ENCOUNTER — Encounter (HOSPITAL_COMMUNITY): Payer: Medicare Other

## 2013-11-09 ENCOUNTER — Encounter (HOSPITAL_COMMUNITY)
Admission: RE | Admit: 2013-11-09 | Discharge: 2013-11-09 | Disposition: A | Discharge status: Home / Self Care | Payer: Medicare Other | Source: Ambulatory Visit | Attending: Cardiovascular Disease | Admitting: Cardiovascular Disease

## 2013-11-09 ENCOUNTER — Encounter (HOSPITAL_COMMUNITY): Payer: Medicare Other

## 2013-11-11 ENCOUNTER — Encounter (HOSPITAL_COMMUNITY): Payer: Medicare Other

## 2013-11-11 ENCOUNTER — Encounter (HOSPITAL_COMMUNITY)
Admission: RE | Admit: 2013-11-11 | Discharge: 2013-11-11 | Disposition: A | Discharge status: Home / Self Care | Payer: Medicare Other | Source: Ambulatory Visit | Attending: Cardiovascular Disease | Admitting: Cardiovascular Disease

## 2013-11-13 ENCOUNTER — Encounter (HOSPITAL_COMMUNITY): Payer: Medicare Other

## 2013-11-13 ENCOUNTER — Encounter (HOSPITAL_COMMUNITY)
Admission: RE | Admit: 2013-11-13 | Discharge: 2013-11-13 | Disposition: A | Discharge status: Home / Self Care | Payer: Medicare Other | Source: Ambulatory Visit | Attending: Cardiovascular Disease | Admitting: Cardiovascular Disease

## 2013-11-13 DIAGNOSIS — Z5189 Encounter for other specified aftercare: Secondary | ICD-10-CM | POA: Insufficient documentation

## 2013-11-13 DIAGNOSIS — I200 Unstable angina: Secondary | ICD-10-CM | POA: Insufficient documentation

## 2013-11-13 DIAGNOSIS — I10 Essential (primary) hypertension: Secondary | ICD-10-CM | POA: Insufficient documentation

## 2013-11-13 DIAGNOSIS — E119 Type 2 diabetes mellitus without complications: Secondary | ICD-10-CM | POA: Insufficient documentation

## 2013-11-13 DIAGNOSIS — Z8249 Family history of ischemic heart disease and other diseases of the circulatory system: Secondary | ICD-10-CM | POA: Insufficient documentation

## 2013-11-13 DIAGNOSIS — Z9861 Coronary angioplasty status: Secondary | ICD-10-CM | POA: Insufficient documentation

## 2013-11-13 DIAGNOSIS — I2510 Atherosclerotic heart disease of native coronary artery without angina pectoris: Secondary | ICD-10-CM | POA: Insufficient documentation

## 2013-11-16 ENCOUNTER — Encounter (HOSPITAL_COMMUNITY)
Admission: RE | Admit: 2013-11-16 | Discharge: 2013-11-16 | Disposition: A | Discharge status: Home / Self Care | Payer: Medicare Other | Source: Ambulatory Visit | Attending: Cardiovascular Disease | Admitting: Cardiovascular Disease

## 2013-11-16 ENCOUNTER — Encounter (HOSPITAL_COMMUNITY): Payer: Medicare Other

## 2013-11-18 ENCOUNTER — Encounter (HOSPITAL_COMMUNITY): Payer: Medicare Other

## 2013-11-18 ENCOUNTER — Encounter (HOSPITAL_COMMUNITY)
Admission: RE | Admit: 2013-11-18 | Discharge: 2013-11-18 | Disposition: A | Discharge status: Home / Self Care | Payer: Medicare Other | Source: Ambulatory Visit | Attending: Cardiovascular Disease | Admitting: Cardiovascular Disease

## 2013-11-20 ENCOUNTER — Encounter (HOSPITAL_COMMUNITY): Payer: Medicare Other

## 2013-11-20 ENCOUNTER — Encounter (HOSPITAL_COMMUNITY)
Admission: RE | Admit: 2013-11-20 | Discharge: 2013-11-20 | Disposition: A | Discharge status: Home / Self Care | Payer: Medicare Other | Source: Ambulatory Visit | Attending: Cardiovascular Disease | Admitting: Cardiovascular Disease

## 2013-11-23 ENCOUNTER — Encounter (HOSPITAL_COMMUNITY)
Admission: RE | Admit: 2013-11-23 | Discharge: 2013-11-23 | Disposition: A | Discharge status: Home / Self Care | Payer: Medicare Other | Source: Ambulatory Visit | Attending: Cardiovascular Disease | Admitting: Cardiovascular Disease

## 2013-11-23 ENCOUNTER — Encounter (HOSPITAL_COMMUNITY): Payer: Medicare Other

## 2013-11-23 LAB — GLUCOSE, CAPILLARY: Glucose-Capillary: 131 mg/dL — ABNORMAL HIGH (ref 70–99)

## 2013-11-25 ENCOUNTER — Encounter (HOSPITAL_COMMUNITY)
Admission: RE | Admit: 2013-11-25 | Discharge: 2013-11-25 | Disposition: A | Discharge status: Home / Self Care | Payer: Medicare Other | Source: Ambulatory Visit | Attending: Cardiovascular Disease | Admitting: Cardiovascular Disease

## 2013-11-25 ENCOUNTER — Encounter (HOSPITAL_COMMUNITY): Payer: Medicare Other

## 2013-11-25 LAB — GLUCOSE, CAPILLARY: Glucose-Capillary: 122 mg/dL — ABNORMAL HIGH (ref 70–99)

## 2013-11-27 ENCOUNTER — Encounter (HOSPITAL_COMMUNITY)
Admission: RE | Admit: 2013-11-27 | Discharge: 2013-11-27 | Disposition: A | Discharge status: Home / Self Care | Payer: Medicare Other | Source: Ambulatory Visit | Attending: Cardiovascular Disease | Admitting: Cardiovascular Disease

## 2013-11-27 ENCOUNTER — Encounter (HOSPITAL_COMMUNITY): Payer: Medicare Other

## 2013-11-30 ENCOUNTER — Encounter (HOSPITAL_COMMUNITY): Payer: Medicare Other

## 2013-11-30 ENCOUNTER — Encounter (HOSPITAL_COMMUNITY)
Admission: RE | Admit: 2013-11-30 | Discharge: 2013-11-30 | Disposition: A | Discharge status: Home / Self Care | Payer: Medicare Other | Source: Ambulatory Visit | Attending: Cardiovascular Disease | Admitting: Cardiovascular Disease

## 2013-12-01 ENCOUNTER — Encounter: Payer: Self-pay | Admitting: Cardiovascular Disease

## 2013-12-02 ENCOUNTER — Encounter (HOSPITAL_COMMUNITY)
Admission: RE | Admit: 2013-12-02 | Discharge: 2013-12-02 | Disposition: A | Discharge status: Home / Self Care | Payer: Medicare Other | Source: Ambulatory Visit | Attending: Cardiovascular Disease | Admitting: Cardiovascular Disease

## 2013-12-02 ENCOUNTER — Encounter (HOSPITAL_COMMUNITY): Payer: Medicare Other

## 2013-12-04 ENCOUNTER — Encounter (HOSPITAL_COMMUNITY): Payer: Medicare Other

## 2013-12-04 ENCOUNTER — Encounter (HOSPITAL_COMMUNITY)
Admission: RE | Admit: 2013-12-04 | Discharge: 2013-12-04 | Disposition: A | Discharge status: Home / Self Care | Payer: Medicare Other | Source: Ambulatory Visit | Attending: Cardiovascular Disease | Admitting: Cardiovascular Disease

## 2013-12-07 ENCOUNTER — Encounter (HOSPITAL_COMMUNITY): Payer: Medicare Other

## 2013-12-07 ENCOUNTER — Encounter (HOSPITAL_COMMUNITY)
Admission: RE | Admit: 2013-12-07 | Discharge: 2013-12-07 | Disposition: A | Discharge status: Home / Self Care | Payer: Medicare Other | Source: Ambulatory Visit | Attending: Cardiovascular Disease | Admitting: Cardiovascular Disease

## 2013-12-09 ENCOUNTER — Encounter (HOSPITAL_COMMUNITY)
Admission: RE | Admit: 2013-12-09 | Discharge: 2013-12-09 | Disposition: A | Discharge status: Home / Self Care | Payer: Medicare Other | Source: Ambulatory Visit | Attending: Cardiovascular Disease | Admitting: Cardiovascular Disease

## 2013-12-09 ENCOUNTER — Encounter (HOSPITAL_COMMUNITY): Payer: Medicare Other

## 2013-12-11 ENCOUNTER — Encounter (HOSPITAL_COMMUNITY): Payer: Medicare Other

## 2013-12-11 ENCOUNTER — Encounter (HOSPITAL_COMMUNITY)
Admission: RE | Admit: 2013-12-11 | Discharge: 2013-12-11 | Disposition: A | Discharge status: Home / Self Care | Payer: Medicare Other | Source: Ambulatory Visit | Attending: Cardiovascular Disease | Admitting: Cardiovascular Disease

## 2013-12-11 NOTE — Progress Notes (Signed)
Pt c/o right sided chest heaviness and soreness after using the arm crank at cardiac rehab.  Pt states symptoms are unlike her anginal equivalent and are relieved when activity stopped.  Symptoms did not return with other aerobic activities however pt did report arm weakness with doing cool down exercises and light weights.  Pt denies pain, dyspnea.  Pt states symptoms began when she increased the intensity on the arm crank.  Pt declined offer to notify cardiologist.  Pt is scheduled for appt 12/18/2013.  Pt instructed to contact cardiologist if symptoms return or worsen.  Pt instructed to present to ED for severe unrelieved pain. Understanding verbalized

## 2013-12-14 ENCOUNTER — Encounter (HOSPITAL_COMMUNITY): Payer: Medicare Other

## 2013-12-14 ENCOUNTER — Encounter (HOSPITAL_COMMUNITY)
Admission: RE | Admit: 2013-12-14 | Discharge: 2013-12-14 | Disposition: A | Discharge status: Home / Self Care | Payer: Medicare Other | Source: Ambulatory Visit | Attending: Cardiovascular Disease | Admitting: Cardiovascular Disease

## 2013-12-14 DIAGNOSIS — Z9861 Coronary angioplasty status: Secondary | ICD-10-CM | POA: Insufficient documentation

## 2013-12-14 DIAGNOSIS — Z5189 Encounter for other specified aftercare: Secondary | ICD-10-CM | POA: Insufficient documentation

## 2013-12-14 DIAGNOSIS — I10 Essential (primary) hypertension: Secondary | ICD-10-CM | POA: Insufficient documentation

## 2013-12-14 DIAGNOSIS — E119 Type 2 diabetes mellitus without complications: Secondary | ICD-10-CM | POA: Insufficient documentation

## 2013-12-14 DIAGNOSIS — I2510 Atherosclerotic heart disease of native coronary artery without angina pectoris: Secondary | ICD-10-CM | POA: Insufficient documentation

## 2013-12-14 DIAGNOSIS — I200 Unstable angina: Secondary | ICD-10-CM | POA: Insufficient documentation

## 2013-12-14 DIAGNOSIS — Z8249 Family history of ischemic heart disease and other diseases of the circulatory system: Secondary | ICD-10-CM | POA: Insufficient documentation

## 2013-12-15 ENCOUNTER — Encounter: Payer: Self-pay | Admitting: Cardiovascular Disease

## 2013-12-15 ENCOUNTER — Ambulatory Visit (INDEPENDENT_AMBULATORY_CARE_PROVIDER_SITE_OTHER): Payer: Medicare Other | Admitting: Cardiovascular Disease

## 2013-12-15 VITALS — BP 138/58 | HR 78 | Ht 64.5 in | Wt 198.0 lb

## 2013-12-15 DIAGNOSIS — E1165 Type 2 diabetes mellitus with hyperglycemia: Secondary | ICD-10-CM

## 2013-12-15 DIAGNOSIS — I10 Essential (primary) hypertension: Secondary | ICD-10-CM

## 2013-12-15 DIAGNOSIS — R079 Chest pain, unspecified: Secondary | ICD-10-CM

## 2013-12-15 DIAGNOSIS — I2510 Atherosclerotic heart disease of native coronary artery without angina pectoris: Secondary | ICD-10-CM

## 2013-12-15 DIAGNOSIS — IMO0001 Reserved for inherently not codable concepts without codable children: Secondary | ICD-10-CM

## 2013-12-15 NOTE — Assessment & Plan Note (Signed)
Discussed low carb diet.  Target hemoglobin A1c is 6.5 or less.  Continue current medications.  

## 2013-12-15 NOTE — Assessment & Plan Note (Signed)
Still with some atypical pains with exercise  Known residual small vessel PDA disease  STress  myovue to assess Ischemic burden in this territory

## 2013-12-15 NOTE — Assessment & Plan Note (Signed)
Well controlled.  Continue current medications and low sodium Dash type diet.   Norvasc been helpful

## 2013-12-15 NOTE — Patient Instructions (Signed)
Your physician recommends that you schedule a follow-up appointment in:   [place] Your physician recommends that you continue on your current medications as directed. Please refer to the Current Medication list given to you today.  Your physician has requested that you have en exercise stress myoview. For further information please visit [email or web address]. Please follow instruction sheet, as given.

## 2013-12-15 NOTE — Progress Notes (Signed)
Patient ID: Danielle Murray, female   DOB: 1941-09-23, 73 y.o.   MRN: [id number] Danielle Murray is seen back today for a work in visit. Seen for Dr. Johnsie Cancel. She has known CAD with past PCI. Other issues include HLD, IDDM and HTN. She was cathed by Dr. Burt Knack back in October had 2 vessel disease with proximal/mid LAD and left dominant PD. Treated with stent to the LAD and medical therapy for her residual disease - Dr. Johnsie Cancel reviewed her films - apparently the PD is quite small - would be "lucky to take a 2.0 stent".  Had some chest pain at rehab and saw PA 12/14 Norvasc added for BP control  Doing better with no chest pain now  Twin sister just past away and this is Very hard for her   ROS: Denies fever, malais, weight loss, blurry vision, decreased visual acuity, cough, sputum, SOB, hemoptysis, pleuritic pain, palpitaitons, heartburn, abdominal pain, melena, lower extremity edema, claudication, or rash.  All other systems reviewed and negative  General: Affect appropriate Healthy:  appears stated age 73: normal Neck supple with no adenopathy JVP normal no bruits no thyromegaly Lungs clear with no wheezing and good diaphragmatic motion Heart:  S1/S2 no murmur, no rub, gallop or click PMI normal Abdomen: benighn, BS positve, no tenderness, no AAA no bruit.  No HSM or HJR Distal pulses intact with no bruits No edema Neuro non-focal Skin warm and dry No muscular weakness   Current Outpatient Prescriptions  Medication Sig Dispense Refill  . amLODipine (NORVASC) 5 MG tablet Take 1 tablet (5 mg total) by mouth daily.  30 tablet  3  . aspirin 81 MG tablet Take 81 mg by mouth daily.      Marland Kitchen atorvastatin (LIPITOR) 40 MG tablet Take 1 tablet (40 mg total) by mouth every evening.  30 tablet  6  . benzonatate (TESSALON) 200 MG capsule Take 200 mg by mouth as needed for cough.      . carvedilol (COREG) 3.125 MG tablet Take 1 tablet (3.125 mg total) by mouth 2 (two) times daily.  180 tablet  3  .  clopidogrel (PLAVIX) 75 MG tablet Take 75 mg by mouth daily.      . Insulin Aspart Prot & Aspart (NOVOLOG MIX 70/30 FLEXPEN) (70-30) 100 UNIT/ML SUPN Inject 40-60 Units into the skin 2 (two) times daily before lunch and supper. Take 40 units at breakfast and 60 units at supper.      . levothyroxine (SYNTHROID, LEVOTHROID) 75 MCG tablet Take 75 mcg by mouth daily. M-F only      . losartan-hydrochlorothiazide (HYZAAR) 100-25 MG per tablet Take 1 tablet by mouth daily.      . metFORMIN (GLUCOPHAGE) 500 MG tablet Take 1 tablet (500 mg total) by mouth daily.      . MULTIPLE VITAMIN PO Take 1 tablet by mouth daily.      . nitroGLYCERIN (NITROSTAT) 0.4 MG SL tablet Place 1 tablet (0.4 mg total) under the tongue every 5 (five) minutes as needed for chest pain.  25 tablet  3   No current facility-administered medications for this visit.    Allergies  Penicillins  Electrocardiogram:  SR low voltage rate 67  Assessment and Plan

## 2013-12-16 ENCOUNTER — Encounter (HOSPITAL_COMMUNITY): Payer: Medicare Other

## 2013-12-16 ENCOUNTER — Encounter (HOSPITAL_COMMUNITY)
Admission: RE | Admit: 2013-12-16 | Discharge: 2013-12-16 | Disposition: A | Discharge status: Home / Self Care | Payer: Medicare Other | Source: Ambulatory Visit | Attending: Cardiovascular Disease | Admitting: Cardiovascular Disease

## 2013-12-16 LAB — GLUCOSE, CAPILLARY: GLUCOSE-CAPILLARY: 135 mg/dL — AB (ref 70–99)

## 2013-12-18 ENCOUNTER — Encounter (HOSPITAL_COMMUNITY): Payer: Medicare Other

## 2013-12-18 ENCOUNTER — Encounter (HOSPITAL_COMMUNITY)
Admission: RE | Admit: 2013-12-18 | Discharge: 2013-12-18 | Disposition: A | Discharge status: Home / Self Care | Payer: Medicare Other | Source: Ambulatory Visit | Attending: Cardiovascular Disease | Admitting: Cardiovascular Disease

## 2013-12-21 ENCOUNTER — Encounter (HOSPITAL_COMMUNITY): Payer: Medicare Other

## 2013-12-21 ENCOUNTER — Encounter (HOSPITAL_COMMUNITY)
Admission: RE | Admit: 2013-12-21 | Discharge: 2013-12-21 | Disposition: A | Discharge status: Home / Self Care | Payer: Medicare Other | Source: Ambulatory Visit | Attending: Cardiovascular Disease | Admitting: Cardiovascular Disease

## 2013-12-23 ENCOUNTER — Encounter (HOSPITAL_COMMUNITY): Payer: Medicare Other

## 2013-12-23 ENCOUNTER — Encounter (HOSPITAL_COMMUNITY): Payer: Self-pay

## 2013-12-23 ENCOUNTER — Encounter (HOSPITAL_COMMUNITY)
Admission: RE | Admit: 2013-12-23 | Discharge: 2013-12-23 | Disposition: A | Discharge status: Home / Self Care | Payer: Medicare Other | Source: Ambulatory Visit | Attending: Cardiovascular Disease | Admitting: Cardiovascular Disease

## 2013-12-23 NOTE — Progress Notes (Signed)
Pt graduated from cardiac rehab program today.  Medication list reconciled.  PHQ9 score- 0.  Pt exhibits normal grief pattern following the death of her twin sister earlier this year.  Pt exhibits positive outlook with good coping skills.  Pt reports her chest discomfort has improved with the addition of amlodipine.  Pt has made significant lifestyle changes and should be commended for her success. Pt plans to continue exercising on her own at the [employer] program.

## 2013-12-25 ENCOUNTER — Encounter (HOSPITAL_COMMUNITY): Payer: Medicare Other

## 2013-12-29 ENCOUNTER — Encounter (HOSPITAL_COMMUNITY): Payer: Medicare Other

## 2014-01-04 ENCOUNTER — Encounter: Payer: Self-pay | Admitting: Cardiology

## 2014-01-04 ENCOUNTER — Ambulatory Visit (HOSPITAL_COMMUNITY): Payer: Medicare Other | Attending: Cardiology | Admitting: Radiology

## 2014-01-04 VITALS — BP 143/54 | HR 63 | Ht 64.0 in | Wt 196.0 lb

## 2014-01-04 DIAGNOSIS — Z794 Long term (current) use of insulin: Secondary | ICD-10-CM | POA: Insufficient documentation

## 2014-01-04 DIAGNOSIS — E785 Hyperlipidemia, unspecified: Secondary | ICD-10-CM | POA: Insufficient documentation

## 2014-01-04 DIAGNOSIS — I10 Essential (primary) hypertension: Secondary | ICD-10-CM | POA: Insufficient documentation

## 2014-01-04 DIAGNOSIS — R079 Chest pain, unspecified: Secondary | ICD-10-CM

## 2014-01-04 DIAGNOSIS — E119 Type 2 diabetes mellitus without complications: Secondary | ICD-10-CM | POA: Insufficient documentation

## 2014-01-04 DIAGNOSIS — Z8249 Family history of ischemic heart disease and other diseases of the circulatory system: Secondary | ICD-10-CM | POA: Insufficient documentation

## 2014-01-04 DIAGNOSIS — I2510 Atherosclerotic heart disease of native coronary artery without angina pectoris: Secondary | ICD-10-CM | POA: Insufficient documentation

## 2014-01-04 DIAGNOSIS — R0789 Other chest pain: Secondary | ICD-10-CM | POA: Insufficient documentation

## 2014-01-04 MED ORDER — TECHNETIUM TC 99M SESTAMIBI GENERIC - CARDIOLITE
11.0000 | Freq: Once | INTRAVENOUS | Status: AC | PRN
  Administered 2014-01-04: 11 via INTRAVENOUS

## 2014-01-04 MED ORDER — TECHNETIUM TC 99M SESTAMIBI GENERIC - CARDIOLITE
33.0000 | Freq: Once | INTRAVENOUS | Status: AC | PRN
  Administered 2014-01-04: 33 via INTRAVENOUS

## 2014-01-04 NOTE — Progress Notes (Signed)
  [facility] [address] [place], [place] [address] [phone]    Cardiology Nuclear Med Study  Danielle Murray is a 73 y.o. female     MRN : [id number]     DOB: 1941/10/03  Procedure Date: 01/04/2014  Nuclear Med Background Indication for Stress Test:  Evaluation for Ischemia and Stent Patency History:  CAD, Cath 2014 (residual disease), Stent (LAD) Cardiac Risk Factors: Family History - CAD, Hypertension, Lipids and IDDM  Symptoms:  Chest Pain (last date of chest discomfort was three weeks ago)   Nuclear Pre-Procedure Caffeine/Decaff Intake:  8:30pm NPO After: 8:30pm   Lungs:  clear O2 Sat: 96% on room air. IV 0.9% NS with Angio Cath:  22g  IV Site: R Hand  IV Started by:  Matilde Haymaker, RN  Chest Size (in):  38 Cup Size: C  Height: 5\' 4"  (1.626 m)  Weight:  196 lb (88.905 kg)  BMI:  Body mass index is 33.63 kg/(m^2). Tech Comments:  No Coreg x 12 hrs    Nuclear Med Study 1 or 2 day study: 1 day  Stress Test Type:  Stress  Reading MD: n/a  Order Authorizing Provider:  Verlin Dike  Resting Radionuclide: Technetium 35m Sestamibi  Resting Radionuclide Dose: 11.0 mCi   Stress Radionuclide:  Technetium 93m Sestamibi  Stress Radionuclide Dose: 33.0 mCi           Stress Protocol Rest HR: 63 Stress HR: 142  Rest BP: 143/54 Stress BP: 192/81  Exercise Time (min): 5:30 METS: 7.0           Dose of Adenosine (mg):  n/a Dose of Lexiscan: n/a mg  Dose of Atropine (mg): n/a Dose of Dobutamine: n/a mcg/kg/min (at max HR)  Stress Test Technologist: Glade Lloyd, BS-ES  Nuclear Technologist:  Charlton Amor, CNMT     Rest Procedure:  Myocardial perfusion imaging was performed at rest 45 minutes following the intravenous administration of Technetium 21m Sestamibi. Rest ECG: NSR - Normal EKG  Stress Procedure:  The patient exercised on the treadmill utilizing the Bruce Protocol for 5:30 minutes. The patient stopped due to fatigue and  chest tightness 7/10.  Technetium 58m Sestamibi was injected at peak exercise and myocardial perfusion imaging was performed after a brief delay. Stress ECG: Insignificant upsloping ST segment depression.  QPS Raw Data Images:  Normal; no motion artifact; normal heart/lung ratio. Stress Images:  Normal homogeneous uptake in all areas of the myocardium. Rest Images:  Normal homogeneous uptake in all areas of the myocardium. Subtraction (SDS):  There is no evidence of scar or ischemia. Transient Ischemic Dilatation (Normal <1.22):  1.00 Lung/Heart Ratio (Normal <0.45):  0.38  Quantitative Gated Spect Images QGS EDV:  74 ml QGS ESV:  24 ml  Impression Exercise Capacity:  Fair exercise capacity. BP Response:  Hypertensive blood pressure response. Clinical Symptoms:  Chest tightness ECG Impression:  Insignificant upsloping ST segment depression. Comparison with Prior Nuclear Study: No images to compare  Overall Impression:  Normal stress nuclear study.  LV Ejection Fraction: 68%.  LV Wall Motion:  NL LV Function; NL Wall Motion  Danielle Murray 01/04/2014

## 2014-01-15 ENCOUNTER — Encounter: Payer: Medicare Other | Attending: Endocrinology

## 2014-01-15 VITALS — Ht 64.0 in | Wt 196.0 lb

## 2014-01-15 DIAGNOSIS — Z713 Dietary counseling and surveillance: Secondary | ICD-10-CM | POA: Insufficient documentation

## 2014-01-15 DIAGNOSIS — E119 Type 2 diabetes mellitus without complications: Secondary | ICD-10-CM | POA: Insufficient documentation

## 2014-01-15 DIAGNOSIS — E1165 Type 2 diabetes mellitus with hyperglycemia: Secondary | ICD-10-CM

## 2014-01-15 DIAGNOSIS — IMO0001 Reserved for inherently not codable concepts without codable children: Secondary | ICD-10-CM

## 2014-01-18 ENCOUNTER — Encounter: Payer: Self-pay | Admitting: Cardiovascular Disease

## 2014-01-18 NOTE — Progress Notes (Signed)
Patient was seen on 01/15/14 for the first of a series of three diabetes self-management courses at the [facility].  Current HbA1c: 8.3%    The following learning objectives were met by the patient during this class:  Describe diabetes  State some common risk factors for diabetes  Defines the role of glucose and insulin  Identifies type of diabetes and pathophysiology  Describe the relationship between diabetes and cardiovascular risk  State the members of the Healthcare Team  States the rationale for glucose monitoring  State when to test glucose  State their individual Target Range  State the importance of logging glucose readings  Describe how to interpret glucose readings  Identifies A1C target  Explain the correlation between A1c and eAG values  State symptoms and treatment of high blood glucose  State symptoms and treatment of low blood glucose  Explain proper technique for glucose testing  Identifies proper sharps disposal  Handouts given during class include:  Living Well with Diabetes book  Carb Counting and Meal Planning book  Meal Plan Card  Carbohydrate guide  Meal planning worksheet  Low Sodium Flavoring Tips  The diabetes portion plate  J6R to eAG Conversion Chart  Diabetes Medications  Diabetes Recommended Care Schedule  Support Group  Diabetes Success Plan  Core Class Satisfaction Survey  Follow-Up Plan:  Attend core 2

## 2014-01-22 ENCOUNTER — Encounter: Payer: Medicare Other | Attending: Endocrinology

## 2014-01-22 DIAGNOSIS — IMO0001 Reserved for inherently not codable concepts without codable children: Secondary | ICD-10-CM

## 2014-01-22 DIAGNOSIS — E119 Type 2 diabetes mellitus without complications: Secondary | ICD-10-CM | POA: Insufficient documentation

## 2014-01-22 DIAGNOSIS — E1165 Type 2 diabetes mellitus with hyperglycemia: Secondary | ICD-10-CM

## 2014-01-22 DIAGNOSIS — Z713 Dietary counseling and surveillance: Secondary | ICD-10-CM | POA: Insufficient documentation

## 2014-01-22 NOTE — Progress Notes (Signed)

## 2014-01-29 DIAGNOSIS — IMO0001 Reserved for inherently not codable concepts without codable children: Secondary | ICD-10-CM

## 2014-01-29 DIAGNOSIS — E1165 Type 2 diabetes mellitus with hyperglycemia: Principal | ICD-10-CM

## 2014-01-29 NOTE — Progress Notes (Signed)
Patient was seen on 01/29/14 for the third of a series of three diabetes self-management courses at the [facility]. The following learning objectives were met by the patient during this class:    State the amount of activity recommended for healthy living   Describe activities suitable for individual needs   Identify ways to regularly incorporate activity into daily life   Identify barriers to activity and ways to over come these barriers  Identify diabetes medications being personally used and their primary action for lowering glucose and possible side effects   Describe role of stress on blood glucose and develop strategies to address psychosocial issues   Identify diabetes complications and ways to prevent them  Explain how to manage diabetes during illness   Evaluate success in meeting personal goal   Establish 2-3 goals that they will plan to diligently work on until they return for the  27-monthfollow-up visit  Goals:  Follow Diabetes Meal Plan as instructed  Aim for 15-30 mins of physical activity daily as tolerated  Bring food record and glucose log to your follow up visit  Your patient has established the following 4 month goals in their individualized success plan: I will count my carb choices at most meals and snacks I will increase my activity level at least 5 days a week I will take my diabetes medications as scheduled I will test my glucose at least 2 times a day, 7 days a week  Your patient has identified these potential barriers to change:  None noted  Your patient has identified their diabetes self-care support plan as  [facility]

## 2014-02-03 ENCOUNTER — Encounter: Payer: Self-pay | Admitting: Cardiovascular Disease

## 2014-02-18 ENCOUNTER — Other Ambulatory Visit: Payer: Self-pay | Admitting: Nurse Practitioner

## 2014-02-25 ENCOUNTER — Encounter: Payer: Medicare Other | Admitting: Internal Medicine

## 2014-02-26 ENCOUNTER — Encounter: Payer: Medicare Other | Admitting: Internal Medicine

## 2014-02-27 IMAGING — CR DG CHEST 2V
2 series · 2 of 2 positions shown · non-contrast
Comparison: April 04, 2012.

CLINICAL DATA: Hypertension.

EXAM:
CHEST  2 VIEW

[view not recorded (1 of 2)]
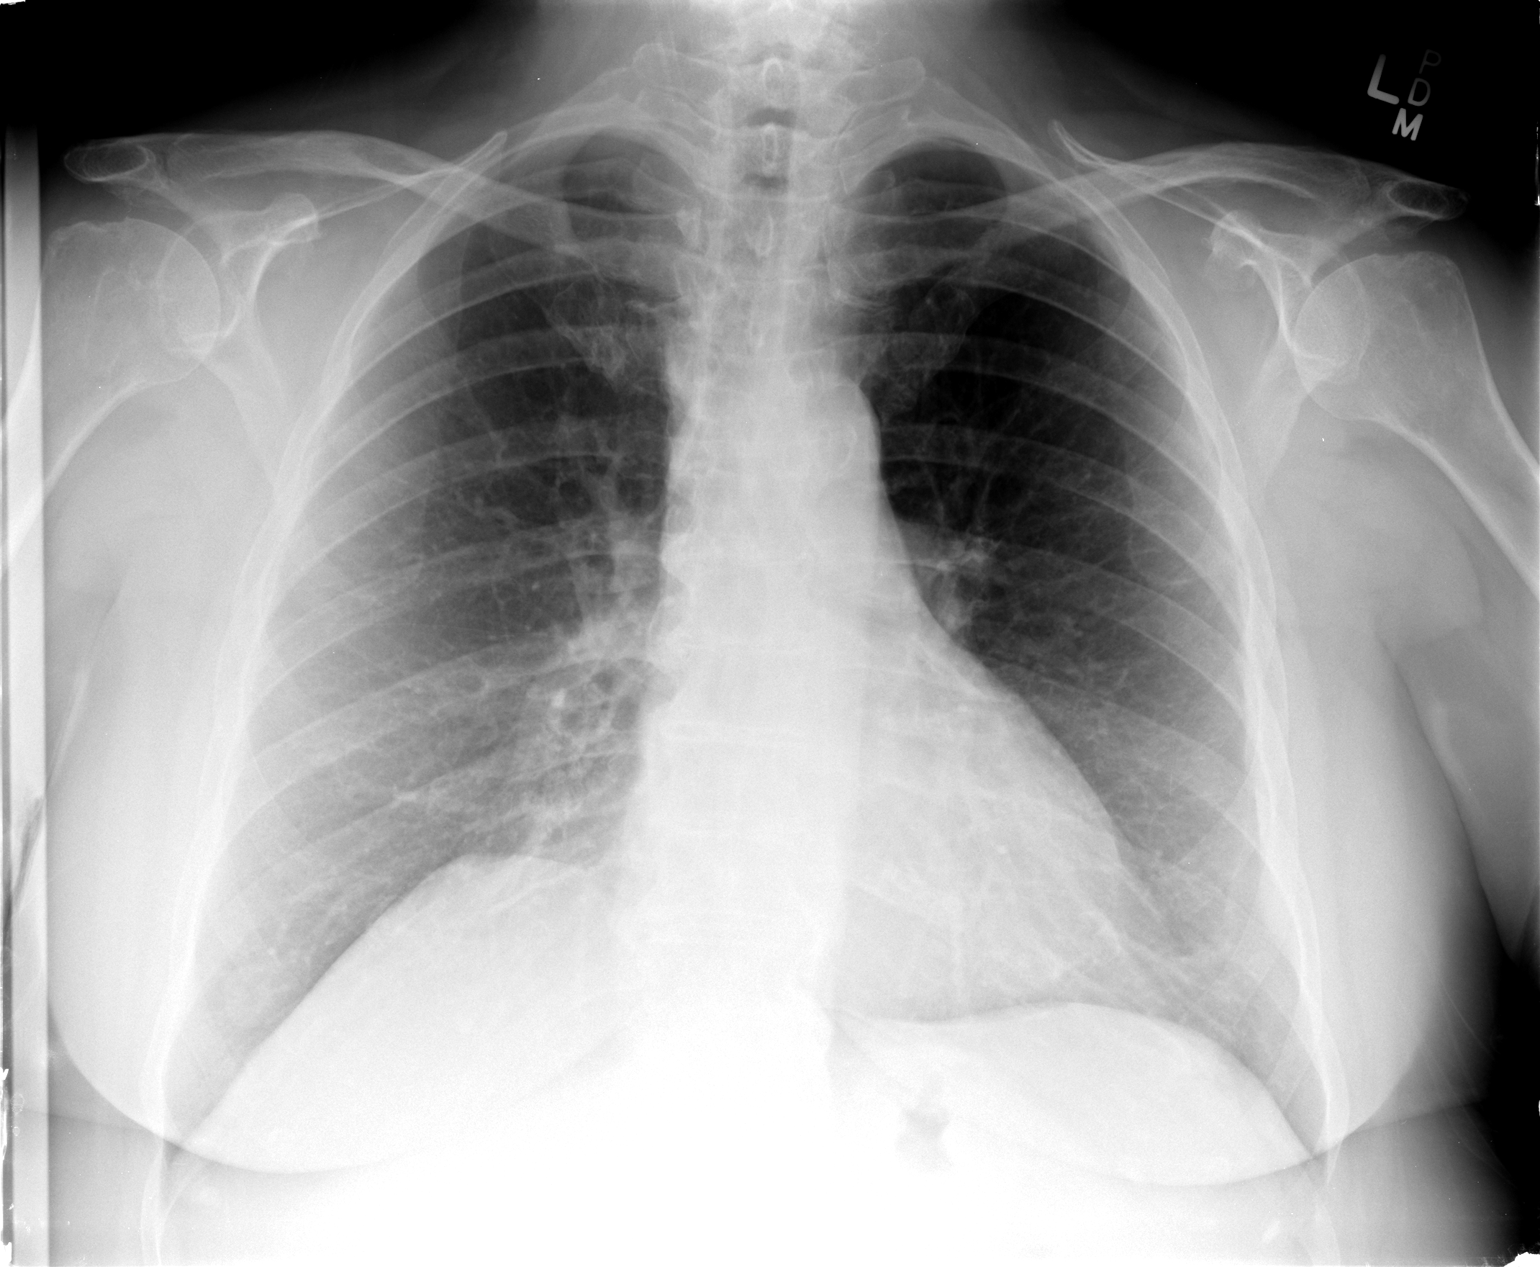

[view not recorded (2 of 2)]
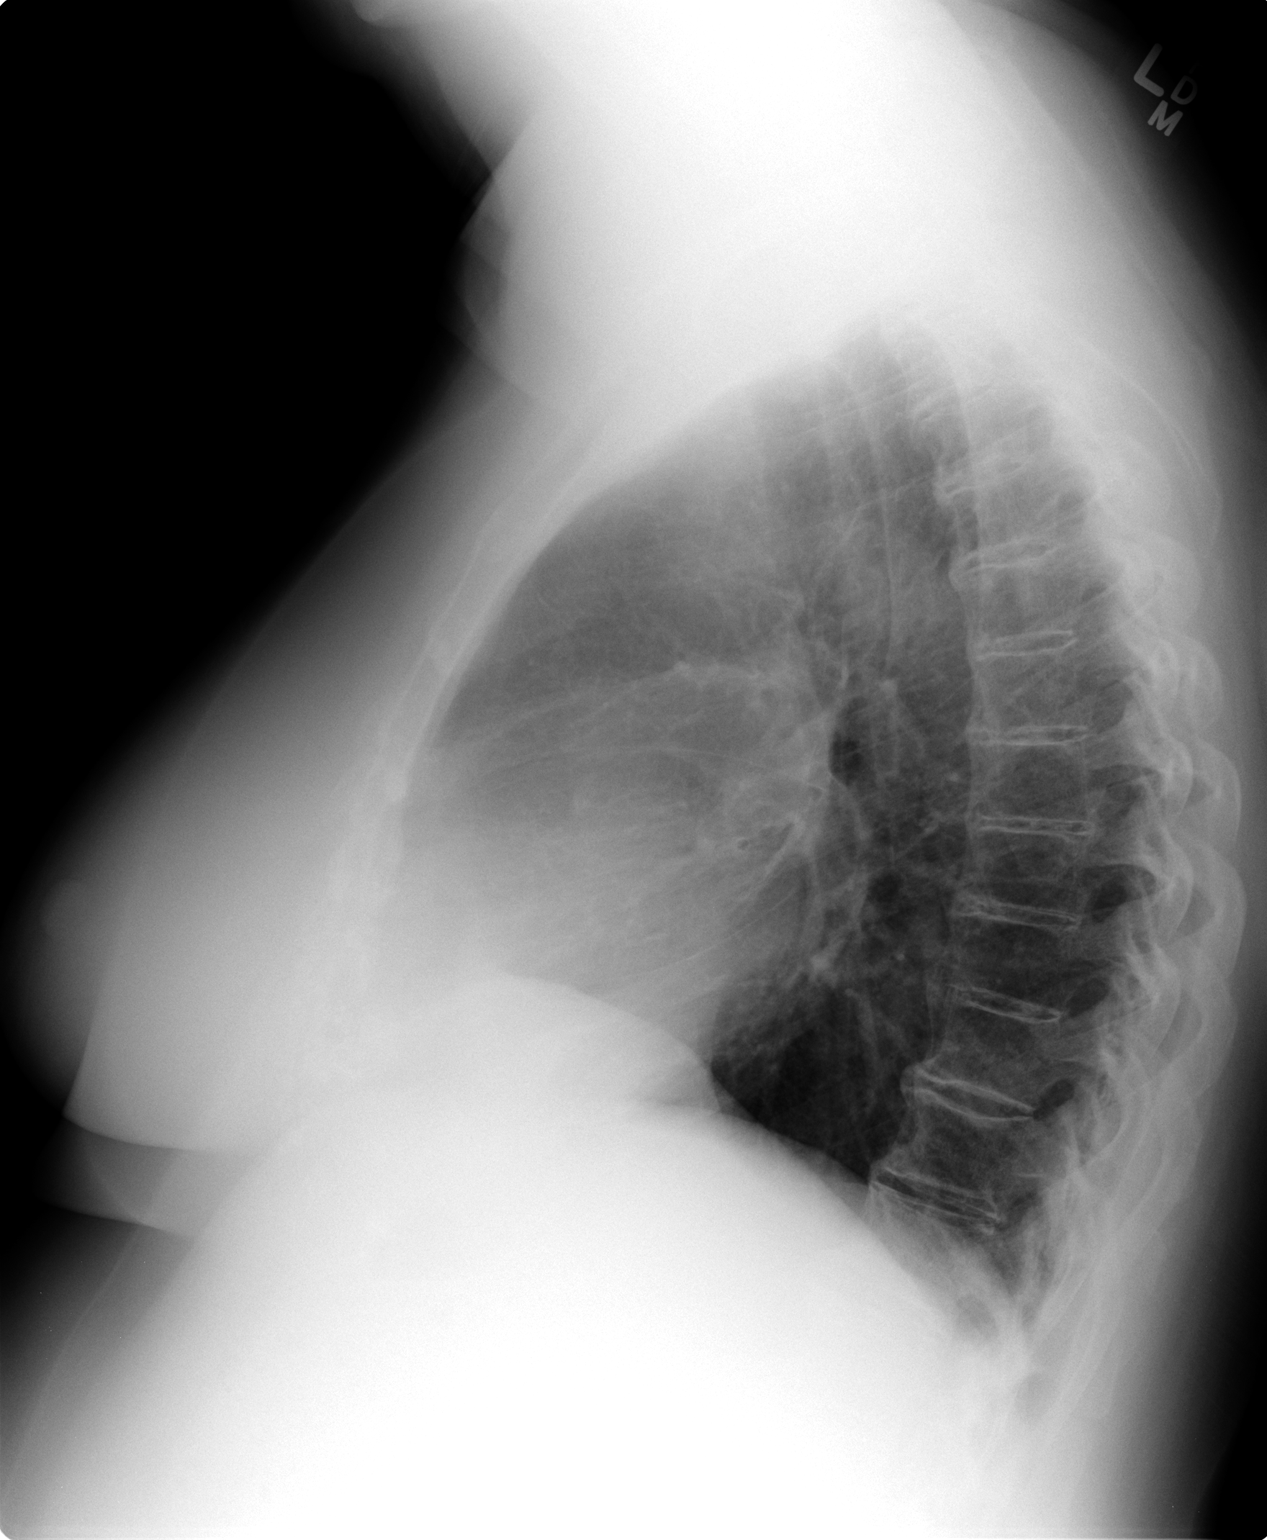

[2 of 2 positions shown; findings below may reference images not displayed]

FINDINGS: The heart size and mediastinal contours are within normal limits.
Both lungs are clear. Degenerative changes of mid thoracic spine are
noted.
IMPRESSION: No active cardiopulmonary disease.

## 2014-03-02 ENCOUNTER — Ambulatory Visit (INDEPENDENT_AMBULATORY_CARE_PROVIDER_SITE_OTHER): Payer: Medicare Other | Admitting: Internal Medicine

## 2014-03-02 ENCOUNTER — Encounter: Payer: Self-pay | Admitting: Internal Medicine

## 2014-03-02 VITALS — BP 120/60 | HR 68 | Temp 98.0°F | Resp 12 | Wt 200.0 lb

## 2014-03-02 DIAGNOSIS — IMO0001 Reserved for inherently not codable concepts without codable children: Secondary | ICD-10-CM

## 2014-03-02 DIAGNOSIS — E785 Hyperlipidemia, unspecified: Secondary | ICD-10-CM

## 2014-03-02 DIAGNOSIS — E1165 Type 2 diabetes mellitus with hyperglycemia: Secondary | ICD-10-CM

## 2014-03-02 DIAGNOSIS — I10 Essential (primary) hypertension: Secondary | ICD-10-CM

## 2014-03-02 DIAGNOSIS — E039 Hypothyroidism, unspecified: Secondary | ICD-10-CM

## 2014-03-02 NOTE — Patient Instructions (Signed)
Your next office appointment will be determined based upon review of your pending labs . Those instructions will be transmitted to you through My Chart  . Share results with Dr Chalmers Cater

## 2014-03-02 NOTE — Progress Notes (Signed)
Subjective:    Patient ID: Danielle Murray, female    DOB: 21-Jul-1941, 73 y.o.   MRN: [id number]  HPI  She is here to assess active health issues & conditions. PMH, FH, & Social history verified & updated   HYPERTENSION: Disease Monitoring: Blood pressure range/ average :no monitor @ home Medication Compliance:yes  Diabetes :  Diet: counting carbs FBS range/average:70- 187   Highest 2 hr post meal glucose: almost 300 Medication compliance: Hypoglycemia:2 while in Cardiac Rehab Ophthamology care:UTD; cataract surgery bilaterally Podiatry care:no  HYPERLIPIDEMIA: Disease Monitoring:monitor overdue, last LDL 155 in 9/14 Medication Compliance:yes  Stenting 09-16-13. Twin sister died in sleep ; she had had 2 MIs & DM 09/25/14      Review of Systems  Chest pain, palpitations: no    Dyspnea:no Edema:slight  Claudication: no Lightheadedness,Syncope:isolated lightheadedness Weight gain/loss:up 5-7# Polyuria/phagia/dipsia:thirsty     Blurred vision /diplopia/lossof vision:not post catarcats Limb numbness/tingling/burning:L thumb intermittently Non healing skin lesions:no Abd pain, bowel changes: no Myalgias: occasionally in legs Memory loss:no      Objective:   Physical Exam Gen.:  throughout exam.Some weight excess centrally Appears younger than stated age  Head: Normocephalic without obvious abnormalities Eyes: No corneal or conjunctival inflammation noted. Pupils equal round reactive to light and accommodation. Extraocular motion intact.  Nose: External nasal exam reveals no deformity or inflammation. Nasal mucosa are pink and moist. No lesions or exudates noted.   Mouth: Oral mucosa and oropharynx reveal no lesions or exudates. Teeth in good repair. Neck: No deformities, masses, or tenderness noted. Range of motion decreased. Thyroid normal. Lungs: Normal respiratory effort; chest expands symmetrically. Lungs are clear to auscultation without rales, wheezes, or  increased work of breathing. Heart: Normal rate and rhythm. Normal S1 and S2. No gallop, click, or rub. No murmur. Abdomen: Bowel sounds normal; abdomen soft and nontender. No masses, organomegaly or hernias noted. Genitalia: as per Gyn                                  Musculoskeletal/extremities: No deformity or scoliosis noted of  the thoracic or lumbar spine.   No clubbing, cyanosis, edema, or significant extremity  deformity noted. Range of motion decreased @ knees.Tone & strength normal. Hand joints reveal mild  DJD DIP changes.  Fingernail / toenail health good. Able to lie down & sit up w/o help. Negative SLR bilaterally Vascular: Carotid, radial artery, dorsalis pedis and  posterior tibial pulses are full and equal. No bruits present. Neurologic: Alert and oriented x3. Deep tendon reflexes symmetrical and normal.  Gait normal . Skin: Intact without suspicious lesions or rashes. Lymph: No cervical, axillary lymphadenopathy present. Psych: Mood and affect are normal. Normally interactive. More focused about health                                                                                        Assessment & Plan:  See Current Assessment & Plan in Problem List under specific DiagnosisThe labs will be reviewed and risks and options assessed. Written recommendations will be provided by mail or directly through  My Chart.Further evaluation or change in medical therapy will be directed by those results.

## 2014-03-02 NOTE — Progress Notes (Signed)
Pre visit review using our clinic review tool, if applicable. No additional management support is needed unless otherwise documented below in the visit note. 

## 2014-03-02 NOTE — Assessment & Plan Note (Signed)
A1c , urine microalbumin, BMET 

## 2014-03-02 NOTE — Assessment & Plan Note (Signed)
Lipids, LFTs,CK,TSH 

## 2014-03-02 NOTE — Assessment & Plan Note (Signed)
TSH 

## 2014-03-03 ENCOUNTER — Other Ambulatory Visit (INDEPENDENT_AMBULATORY_CARE_PROVIDER_SITE_OTHER): Payer: Medicare Other

## 2014-03-03 DIAGNOSIS — E785 Hyperlipidemia, unspecified: Secondary | ICD-10-CM

## 2014-03-03 DIAGNOSIS — I10 Essential (primary) hypertension: Secondary | ICD-10-CM

## 2014-03-03 DIAGNOSIS — E1165 Type 2 diabetes mellitus with hyperglycemia: Secondary | ICD-10-CM

## 2014-03-03 DIAGNOSIS — E039 Hypothyroidism, unspecified: Secondary | ICD-10-CM

## 2014-03-03 DIAGNOSIS — IMO0001 Reserved for inherently not codable concepts without codable children: Secondary | ICD-10-CM

## 2014-03-03 LAB — BASIC METABOLIC PANEL
BUN: 18 mg/dL (ref 6–23)
CALCIUM: 9.4 mg/dL (ref 8.4–10.5)
CO2: 25 mEq/L (ref 19–32)
Chloride: 103 mEq/L (ref 96–112)
Creatinine, Ser: 0.7 mg/dL (ref 0.4–1.2)
GFR: 83.22 mL/min (ref >60.00)
GLUCOSE: 119 mg/dL — AB (ref 70–99)
POTASSIUM: 4 meq/L (ref 3.5–5.1)
Sodium: 141 mEq/L (ref 135–145)

## 2014-03-03 LAB — TSH: TSH: 1.34 u[IU]/mL (ref 0.35–5.50)

## 2014-03-03 LAB — MICROALBUMIN / CREATININE URINE RATIO
Creatinine,U: 75.1 mg/dL
MICROALB UR: 0.6 mg/dL (ref 0.0–1.9)
MICROALB/CREAT RATIO: 0.8 mg/g (ref 0.0–30.0)

## 2014-03-03 LAB — HEPATIC FUNCTION PANEL
ALT: 17 U/L (ref 0–35)
AST: 16 U/L (ref 0–37)
Albumin: 3.7 g/dL (ref 3.5–5.2)
Alkaline Phosphatase: 68 U/L (ref 39–117)
BILIRUBIN DIRECT: 0.1 mg/dL (ref 0.0–0.3)
TOTAL PROTEIN: 7.2 g/dL (ref 6.0–8.3)
Total Bilirubin: 0.7 mg/dL (ref 0.3–1.2)

## 2014-03-03 LAB — LIPID PANEL
Cholesterol: 141 mg/dL (ref 0–200)
HDL: 37.4 mg/dL — AB (ref >39.00)
LDL Cholesterol: 71 mg/dL (ref 0–99)
Total CHOL/HDL Ratio: 4
Triglycerides: 161 mg/dL — ABNORMAL HIGH (ref 0.0–149.0)
VLDL: 32.2 mg/dL (ref 0.0–40.0)

## 2014-03-03 LAB — CK: Total CK: 75 U/L (ref 7–177)

## 2014-03-03 LAB — HEMOGLOBIN A1C: HEMOGLOBIN A1C: 7.5 % — AB (ref 4.6–6.5)

## 2014-04-30 ENCOUNTER — Other Ambulatory Visit: Payer: Self-pay

## 2014-04-30 MED ORDER — ATORVASTATIN CALCIUM 40 MG PO TABS: 40.0000 mg | ORAL_TABLET | Freq: Every evening | ORAL | Status: DC

## 2014-05-17 ENCOUNTER — Encounter: Payer: Medicare Other | Attending: Internal Medicine

## 2014-05-17 DIAGNOSIS — E1159 Type 2 diabetes mellitus with other circulatory complications: Secondary | ICD-10-CM

## 2014-05-17 DIAGNOSIS — Z713 Dietary counseling and surveillance: Secondary | ICD-10-CM | POA: Diagnosis not present

## 2014-05-17 NOTE — Progress Notes (Signed)
Patient was seen on 05/17/2014 for a review of the series of three diabetes self-management courses at the [facility]. The following learning objectives were met by the patient during this class:    Reviewed blood glucose monitoring and interpretation including the recommended target ranges and Hgb A1c.    Reviewed on carb counting, importance of regularly scheduled meals/snacks, and meal planning.    Reviewed the effects of physical activity on glucose levels and long-term glucose control.  Recommended goal of 150 minutes of physical activity/week.   Reviewed patient medications and discussed role of medication on blood glucose and possible side effects.   Discussed strategies to manage stress, psychosocial issues, and other obstacles to diabetes management.   Encouraged moderate weight reduction to improve glucose levels.     Reviewed short-term complications: hyper- and hypo-glycemia.  Discussed causes, symptoms, and treatment options.   Reviewed prevention, detection, and treatment of long-term complications.  Discussed the role of prolonged elevated glucose levels on body systems.  Goals:  Follow Diabetes Meal Plan as instructed  Eat 3 meals and 2 snacks, every 3-5 hrs  Limit carbohydrate intake to 45 grams carbohydrate/meal Limit carbohydrate intake to 15 grams carbohydrate/snack Add lean protein foods to meals/snacks  Monitor glucose levels as instructed by your doctor  Aim for goal of 15-30 mins of physical activity daily as tolerated  Bring food record and glucose log to your next nutrition visit   

## 2014-07-08 ENCOUNTER — Other Ambulatory Visit: Payer: Self-pay

## 2014-07-08 MED ORDER — AMLODIPINE BESYLATE 5 MG PO TABS: ORAL_TABLET | ORAL | Status: DC

## 2014-08-19 ENCOUNTER — Telehealth: Payer: Self-pay

## 2014-08-19 NOTE — Telephone Encounter (Signed)
Left a message for call back.  Called patient regarding diabetic eye exam.  When patient calls back please ask:  Have you had a recent (2014-2015) eye exam?    Date of Exam?  Where?    

## 2014-09-04 ENCOUNTER — Other Ambulatory Visit: Payer: Self-pay | Admitting: Cardiovascular Disease

## 2014-09-06 ENCOUNTER — Other Ambulatory Visit: Payer: Self-pay

## 2014-09-06 MED ORDER — CLOPIDOGREL BISULFATE 75 MG PO TABS: 75.0000 mg | ORAL_TABLET | Freq: Every day | ORAL | Status: DC

## 2014-09-06 MED ORDER — AMLODIPINE BESYLATE 5 MG PO TABS: ORAL_TABLET | ORAL | Status: DC

## 2014-10-02 ENCOUNTER — Other Ambulatory Visit: Payer: Self-pay | Admitting: Cardiovascular Disease

## 2014-10-04 ENCOUNTER — Other Ambulatory Visit: Payer: Self-pay

## 2014-10-04 MED ORDER — CLOPIDOGREL BISULFATE 75 MG PO TABS: 75.0000 mg | ORAL_TABLET | Freq: Every day | ORAL | Status: DC

## 2014-10-04 MED ORDER — AMLODIPINE BESYLATE 5 MG PO TABS: ORAL_TABLET | ORAL | Status: DC

## 2014-10-04 MED ORDER — CARVEDILOL 3.125 MG PO TABS: ORAL_TABLET | ORAL | Status: DC

## 2014-10-05 ENCOUNTER — Ambulatory Visit (INDEPENDENT_AMBULATORY_CARE_PROVIDER_SITE_OTHER): Payer: Medicare Other | Admitting: Internal Medicine

## 2014-10-05 ENCOUNTER — Encounter: Payer: Self-pay | Admitting: Internal Medicine

## 2014-10-05 VITALS — BP 116/60 | HR 68 | Temp 98.2°F | Wt 204.4 lb

## 2014-10-05 DIAGNOSIS — J209 Acute bronchitis, unspecified: Secondary | ICD-10-CM

## 2014-10-05 MED ORDER — AZITHROMYCIN 250 MG PO TABS: ORAL_TABLET | ORAL | Status: DC

## 2014-10-05 MED ORDER — HYDROCODONE-HOMATROPINE 5-1.5 MG/5ML PO SYRP: 5.0000 mL | ORAL_SOLUTION | Freq: Four times a day (QID) | ORAL | Status: DC | PRN

## 2014-10-05 NOTE — Progress Notes (Signed)
   Subjective:    Patient ID: Danielle Murray, female    DOB: May 26, 1941, 73 y.o.   MRN: [id number]  HPI Symptoms began 3 weeks ago as a cold with rhinitis. The next week she developed a loose cough. This has now become productive of yellow/green sputum. The cough is worse in the evening and after talking & singing  She's used over-the-counter cough drops with minimal response  She's had some associated fever or chills. She also describes some wheezing and shortness of breath.  She is having discoloration from the chest but not the head  She has never smoked.  Review of Systems Frontal headache, facial pain , nasal purulence, dental pain, sore throat , otic pain or otic discharge denied. No fever , chills or sweats.     Objective:   Physical Exam  General appearance:good health ;well nourished; no acute distress or increased work of breathing is present.  No  lymphadenopathy about the head, neck, or axilla noted.   Eyes: No conjunctival inflammation or lid edema is present. There is no scleral icterus.  Ears:  External ear exam shows no significant lesions or deformities.  Otoscopic examination reveals clear canals, tympanic membranes are intact bilaterally without bulging, retraction, inflammation or discharge.  Nose:  External nasal examination shows no deformity or inflammation. Nasal mucosa are pink and moist without lesions or exudates. No septal dislocation or deviation.No obstruction to airflow.   Oral exam: Dental hygiene is good; lips and gums are healthy appearing.There is no oropharyngeal erythema or exudate noted.   Neck:  No deformities, thyromegaly, masses, or tenderness noted.   Supple with full range of motion without pain.   Heart:  Normal rate and regular rhythm. S1 and S2 normal without gallop, murmur, click, rub or other extra sounds.   Lungs:Chest clear to auscultation; no wheezes, rhonchi,rales ,or rubs present.No increased work of breathing.  Dry  cough  Extremities:  No cyanosis, edema, or clubbing  noted    Skin: Warm & dry w/o jaundice or tenting.        Assessment & Plan:  #1 acute bronchitis w/o bronchospasm  Plan: See orders and recommendations

## 2014-10-05 NOTE — Patient Instructions (Signed)

## 2014-10-05 NOTE — Progress Notes (Signed)
Pre visit review using our clinic review tool, if applicable. No additional management support is needed unless otherwise documented below in the visit note. 

## 2014-10-11 NOTE — Telephone Encounter (Signed)
No call back from patient.  Encounter closed.   

## 2014-10-21 ENCOUNTER — Encounter (HOSPITAL_COMMUNITY): Payer: Self-pay | Admitting: Cardiovascular Disease

## 2014-10-27 NOTE — Progress Notes (Signed)
Patient ID: Danielle Murray, female   DOB: [birth year], 73 y.o.   MRN: [id number] Ms. Trejos is seen  F/u  CAD with past PCI. Other issues include HLD, IDDM and HTN. She was cathed by Dr. Burt Knack back in October 2014 had 2 vessel disease with proximal/mid LAD and left dominant PD. Treated with stent to the LAD and medical therapy for her residual disease - PDA would be hard pressed to fit 2.0 stent  Had some chest pain at rehab and saw PA 12/14 Norvasc added for BP control Doing better with no chest pain now Twin sister just past away in February and this is Very hard for her  01/05/14  Myovue normal no ischemia EF 63%    ROS: Denies fever, malais, weight loss, blurry vision, decreased visual acuity, cough, sputum, SOB, hemoptysis, pleuritic pain, palpitaitons, heartburn, abdominal pain, melena, lower extremity edema, claudication, or rash.  All other systems reviewed and negative  General: Affect appropriate Healthy:  appears stated age 35: normal Neck supple with no adenopathy JVP normal no bruits no thyromegaly Lungs clear with no wheezing and good diaphragmatic motion Heart:  S1/S2 no murmur, no rub, gallop or click PMI normal Abdomen: benighn, BS positve, no tenderness, no AAA no bruit.  No HSM or HJR Distal pulses intact with no bruits No edema Neuro non-focal Skin warm and dry No muscular weakness   Current Outpatient Prescriptions  Medication Sig Dispense Refill  . amLODipine (NORVASC) 5 MG tablet TAKE 1 TABLET (5 MG TOTAL) BY MOUTH DAILY. 30 tablet 0  . aspirin 81 MG tablet Take 81 mg by mouth daily.    Marland Kitchen atorvastatin (LIPITOR) 40 MG tablet Take 1 tablet (40 mg total) by mouth every evening. (Patient not taking: Reported on 10/05/2014) 90 tablet 0  . azithromycin (ZITHROMAX Z-PAK) 250 MG tablet 2 day 1, then 1 qd 6 tablet 0  . benzonatate (TESSALON) 200 MG capsule Take 200 mg by mouth as needed for cough.    . carvedilol (COREG) 3.125 MG tablet TAKE 1 TABLET (3.125 MG  TOTAL) BY MOUTH 2 (TWO) TIMES DAILY. 60 tablet 0  . clopidogrel (PLAVIX) 75 MG tablet Take 1 tablet (75 mg total) by mouth daily. 30 tablet 0  . HYDROcodone-homatropine (HYDROMET) 5-1.5 MG/5ML syrup Take 5 mLs by mouth every 6 (six) hours as needed for cough. 120 mL 0  . Insulin Aspart Prot & Aspart (NOVOLOG MIX 70/30 FLEXPEN) (70-30) 100 UNIT/ML SUPN Inject 40-60 Units into the skin 2 (two) times daily before lunch and supper. Take 35 units at breakfast and 60 units at supper.    . levothyroxine (SYNTHROID, LEVOTHROID) 75 MCG tablet Take 75 mcg by mouth daily. M-F only    . losartan-hydrochlorothiazide (HYZAAR) 100-25 MG per tablet Take 1 tablet by mouth daily.    . metFORMIN (GLUCOPHAGE) 500 MG tablet Take 1 tablet (500 mg total) by mouth daily.    . MULTIPLE VITAMIN PO Take 1 tablet by mouth daily.    . nitroGLYCERIN (NITROSTAT) 0.4 MG SL tablet Place 1 tablet (0.4 mg total) under the tongue every 5 (five) minutes as needed for chest pain. 25 tablet 3   No current facility-administered medications for this visit.    Allergies  Penicillins  Electrocardiogram:  12/14  SR rate 67 low voltage PAC  Today SR rate 73  Low voltage normal no change   Assessment and Plan

## 2014-10-28 ENCOUNTER — Ambulatory Visit (INDEPENDENT_AMBULATORY_CARE_PROVIDER_SITE_OTHER): Payer: Medicare Other | Admitting: Cardiovascular Disease

## 2014-10-28 ENCOUNTER — Encounter: Payer: Self-pay | Admitting: Cardiovascular Disease

## 2014-10-28 VITALS — BP 124/78 | HR 73 | Ht 64.0 in | Wt 203.0 lb

## 2014-10-28 DIAGNOSIS — E785 Hyperlipidemia, unspecified: Secondary | ICD-10-CM

## 2014-10-28 DIAGNOSIS — I471 Supraventricular tachycardia: Secondary | ICD-10-CM

## 2014-10-28 DIAGNOSIS — I2510 Atherosclerotic heart disease of native coronary artery without angina pectoris: Secondary | ICD-10-CM

## 2014-10-28 MED ORDER — AMLODIPINE BESYLATE 5 MG PO TABS: ORAL_TABLET | ORAL | Status: DC

## 2014-10-28 MED ORDER — NITROGLYCERIN 0.4 MG SL SUBL: 0.4000 mg | SUBLINGUAL_TABLET | SUBLINGUAL | Status: DC | PRN

## 2014-10-28 MED ORDER — CARVEDILOL 3.125 MG PO TABS: ORAL_TABLET | ORAL | Status: DC

## 2014-10-28 MED ORDER — ATORVASTATIN CALCIUM 40 MG PO TABS: 40.0000 mg | ORAL_TABLET | Freq: Every evening | ORAL | Status: DC

## 2014-10-28 MED ORDER — CLOPIDOGREL BISULFATE 75 MG PO TABS: 75.0000 mg | ORAL_TABLET | Freq: Every day | ORAL | Status: DC

## 2014-10-28 NOTE — Assessment & Plan Note (Signed)
No palpitations non recurrent continue low dose beta blocker

## 2014-10-28 NOTE — Patient Instructions (Signed)
Your physician recommends that you continue on your current medications as directed. Please refer to the Current Medication list given to you today.  Your medications have been refilled for the year  Your physician wants you to follow-up in: 1 year with Dr.Nishan You will receive a reminder letter in the mail two months in advance. If you don't receive a letter, please call our office to schedule the follow-up appointment.

## 2014-10-28 NOTE — Assessment & Plan Note (Signed)
Stable with no angina and good activity level.  Continue medical Rx New nitro called in  

## 2014-10-28 NOTE — Assessment & Plan Note (Signed)
Cholesterol is at goal.  Continue current dose of statin and diet Rx.  No myalgias or side effects.  F/U  LFT's in 6 months. Lab Results  Component Value Date   LDLCALC 71 03/03/2014

## 2014-10-29 ENCOUNTER — Other Ambulatory Visit: Payer: Self-pay | Admitting: Cardiovascular Disease

## 2015-03-04 ENCOUNTER — Encounter: Payer: Self-pay | Admitting: Internal Medicine

## 2015-03-09 ENCOUNTER — Other Ambulatory Visit: Payer: Medicare Other

## 2015-03-09 ENCOUNTER — Encounter: Payer: Self-pay | Admitting: Internal Medicine

## 2015-03-09 ENCOUNTER — Ambulatory Visit (INDEPENDENT_AMBULATORY_CARE_PROVIDER_SITE_OTHER): Payer: Medicare Other | Admitting: Internal Medicine

## 2015-03-09 VITALS — BP 98/50 | HR 73 | Temp 98.3°F | Ht 64.0 in | Wt 205.0 lb

## 2015-03-09 DIAGNOSIS — R300 Dysuria: Secondary | ICD-10-CM

## 2015-03-09 DIAGNOSIS — E1165 Type 2 diabetes mellitus with hyperglycemia: Secondary | ICD-10-CM

## 2015-03-09 DIAGNOSIS — IMO0002 Reserved for concepts with insufficient information to code with codable children: Secondary | ICD-10-CM

## 2015-03-09 DIAGNOSIS — E1159 Type 2 diabetes mellitus with other circulatory complications: Secondary | ICD-10-CM

## 2015-03-09 DIAGNOSIS — I10 Essential (primary) hypertension: Secondary | ICD-10-CM | POA: Diagnosis not present

## 2015-03-09 DIAGNOSIS — E038 Other specified hypothyroidism: Secondary | ICD-10-CM

## 2015-03-09 DIAGNOSIS — E1151 Type 2 diabetes mellitus with diabetic peripheral angiopathy without gangrene: Secondary | ICD-10-CM

## 2015-03-09 DIAGNOSIS — E785 Hyperlipidemia, unspecified: Secondary | ICD-10-CM

## 2015-03-09 DIAGNOSIS — K5710 Diverticulosis of small intestine without perforation or abscess without bleeding: Secondary | ICD-10-CM | POA: Diagnosis not present

## 2015-03-09 NOTE — Progress Notes (Signed)
Pre visit review using our clinic review tool, if applicable. No additional management support is needed unless otherwise documented below in the visit note. 

## 2015-03-09 NOTE — Assessment & Plan Note (Signed)
As per Dr Chalmers Cater

## 2015-03-09 NOTE — Assessment & Plan Note (Signed)
SOC for colonoscopy discussed CBC

## 2015-03-09 NOTE — Progress Notes (Signed)
Subjective:    Patient ID: Danielle Murray, female    DOB: [birth year], 74 y.o.   MRN: [id number]  HPI The patient is here to assess status of active health conditions.  PMH, FH, & Social History reviewed & updated.  Since 4/26 she's had slight dysuria, chills , nocturia X 2 and fatigue.No treatment to date.  She's presently on a "17 day healthy diet". She does restrict salt. She's been working out at [employer] for the last 3 weeks on a treadmill for 15 minutes total as well as machines. She has been working with a Clinical research associate as well. Her only active cardiopulmonary symptoms include some edema as well as some occasional substernal pressure which is not exertional. She does see Dr. Myrna Blazer. No home BP monitoring.  Her diabetes is treated by Dr. Chalmers Cater. Follow-up A1c is due in May.  The last colonoscopy listed is 2003; she's had no follow-up. She denies persistent active GI symptoms.  Review of Systems  Flank pain , pyuria, hematuria, frequency,hesitancy, urgency or polyuria are denied.No fever or sweats.  Palpitations, tachycardia, exertional dyspnea, paroxysmal nocturnal dyspnea, or claudication  are absent.  Unexplained weight loss, abdominal pain, significant dyspepsia, dysphagia, melena, rectal bleeding, or persistently small caliber stools are denied.     Objective:   Physical Exam  Gen.: Adequately nourished in appearance. Alert, appropriate and cooperative throughout exam. BMI:35.17 Appears younger than stated age  Head: Normocephalic without obvious abnormalities  Eyes: No corneal or conjunctival inflammation noted. Pupils equal round reactive to light and accommodation. Extraocular motion intact. Bilateral ptosis Ears: External  ear exam reveals no significant lesions or deformities. Canals clear .TMs normal. Hearing is grossly normal bilaterally. Nose: External nasal exam reveals no deformity or inflammation. Nasal mucosa are pink and moist. No lesions or exudates  noted.   Mouth: Oral mucosa and oropharynx reveal no lesions or exudates. Teeth in good repair. Neck: No deformities, masses, or tenderness noted. Range of motion & Thyroid normal. Lungs: Normal respiratory effort; chest expands symmetrically. Lungs are clear to auscultation without rales, wheezes, or increased work of breathing. Heart: Normal rate and rhythm. Normal S1 and S2. No gallop, click, or rub. S4 w/o murmur. Abdomen: Bowel sounds normal; abdomen soft and nontender. No masses, organomegaly or hernias noted. Genitalia: : as per Gyn                                  Musculoskeletal/extremities: No deformity or scoliosis noted of  the thoracic or lumbar spine.  No clubbing, cyanosis, edema, or significant extremity  deformity noted.  Range of motion decreased @ knees Tone & strength normal. Hand joints normal  Fingernail  health good. Crepitus of knees;R>L  Able to lie down & sit up w/o help.  Negative SLR bilaterally Vascular: Carotid, radial artery, dorsalis pedis and  posterior tibial pulses are full and equal. No bruits present. Neurologic: Alert and oriented x3. Deep tendon reflexes symmetrical but 0-1/2+ @ knees Gait normal      Skin: Intact without suspicious lesions or rashes. Lymph: No cervical, axillary lymphadenopathy present. Psych: Mood and affect are normal. Normally interactive  Assessment & Plan:  See Current Assessment & Plan in Problem List under specific Diagnosis

## 2015-03-09 NOTE — Assessment & Plan Note (Signed)
Blood pressure goals reviewed. BMET Decrease Amlodipine to 1/2 pill qd due to BP & edema

## 2015-03-09 NOTE — Patient Instructions (Addendum)
Drink as much nondairy fluids as possible. Avoid spicy foods or alcohol as  these may aggravate the bladder . Do not take decongestants. Avoid narcotics if possible.   Your next office appointment will be determined based upon review of your pending labs  and  xrays Those instructions will be transmitted to you by My Chart Critical results will be called. Followup as needed for any active or acute issue. Please report any significant change in your symptoms.  Please verify the prescribing physician for all medications.  Minimal BP Goal= AVERAGE < 140/90;  Ideal is an AVERAGE < 135/85. This AVERAGE should be calculated from @ least 5-7 BP readings taken @ different times of day on different days of week. You should not respond to isolated BP readings , but rather the AVERAGE for that week .Please bring your  blood pressure cuff to office visits to verify that it is reliable.It  can also be checked against the blood pressure device at the pharmacy. Finger or wrist cuffs are not dependable; an arm cuff is.

## 2015-03-09 NOTE — Assessment & Plan Note (Signed)
NMR Lipoprofile, LFT, CK

## 2015-03-10 ENCOUNTER — Other Ambulatory Visit: Payer: Self-pay | Admitting: *Deleted

## 2015-03-10 ENCOUNTER — Other Ambulatory Visit (INDEPENDENT_AMBULATORY_CARE_PROVIDER_SITE_OTHER): Payer: Medicare Other

## 2015-03-10 DIAGNOSIS — I10 Essential (primary) hypertension: Secondary | ICD-10-CM | POA: Diagnosis not present

## 2015-03-10 DIAGNOSIS — E1159 Type 2 diabetes mellitus with other circulatory complications: Secondary | ICD-10-CM

## 2015-03-10 DIAGNOSIS — E1151 Type 2 diabetes mellitus with diabetic peripheral angiopathy without gangrene: Secondary | ICD-10-CM

## 2015-03-10 DIAGNOSIS — IMO0002 Reserved for concepts with insufficient information to code with codable children: Secondary | ICD-10-CM

## 2015-03-10 DIAGNOSIS — E785 Hyperlipidemia, unspecified: Secondary | ICD-10-CM | POA: Diagnosis not present

## 2015-03-10 DIAGNOSIS — E1165 Type 2 diabetes mellitus with hyperglycemia: Secondary | ICD-10-CM

## 2015-03-10 LAB — BASIC METABOLIC PANEL
BUN: 18 mg/dL (ref 6–23)
CALCIUM: 9.7 mg/dL (ref 8.4–10.5)
CO2: 28 meq/L (ref 19–32)
CREATININE: 0.9 mg/dL (ref 0.40–1.20)
Chloride: 99 mEq/L (ref 96–112)
GFR: 65.17 mL/min (ref >60.00)
GLUCOSE: 254 mg/dL — AB (ref 70–99)
Potassium: 4.1 mEq/L (ref 3.5–5.1)
Sodium: 137 mEq/L (ref 135–145)

## 2015-03-10 LAB — CBC WITH DIFFERENTIAL/PLATELET
Basophils Absolute: 0.1 10*3/uL (ref 0.0–0.1)
Basophils Relative: 1 % (ref 0.0–3.0)
EOS PCT: 1.9 % (ref 0.0–5.0)
Eosinophils Absolute: 0.1 10*3/uL (ref 0.0–0.7)
HEMATOCRIT: 41.7 % (ref 36.0–46.0)
HEMOGLOBIN: 14.1 g/dL (ref 12.0–15.0)
Lymphocytes Relative: 24 % (ref 12.0–46.0)
Lymphs Abs: 1.7 10*3/uL (ref 0.7–4.0)
MCHC: 33.7 g/dL (ref 30.0–36.0)
MCV: 86 fl (ref 78.0–100.0)
MONO ABS: 0.4 10*3/uL (ref 0.1–1.0)
MONOS PCT: 6 % (ref 3.0–12.0)
Neutro Abs: 4.7 10*3/uL (ref 1.4–7.7)
Neutrophils Relative %: 67.1 % (ref 43.0–77.0)
Platelets: 307 10*3/uL (ref 150.0–400.0)
RBC: 4.85 Mil/uL (ref 3.87–5.11)
RDW: 14.1 % (ref 11.5–15.5)
WBC: 7 10*3/uL (ref 4.0–10.5)

## 2015-03-10 LAB — HEPATIC FUNCTION PANEL
ALT: 11 U/L (ref 0–35)
AST: 11 U/L (ref 0–37)
Albumin: 4 g/dL (ref 3.5–5.2)
Alkaline Phosphatase: 79 U/L (ref 39–117)
BILIRUBIN DIRECT: 0.1 mg/dL (ref 0.0–0.3)
BILIRUBIN TOTAL: 0.8 mg/dL (ref 0.2–1.2)
Total Protein: 6.9 g/dL (ref 6.0–8.3)

## 2015-03-10 LAB — HEMOGLOBIN A1C: Hgb A1c MFr Bld: 9.2 % — ABNORMAL HIGH (ref 4.6–6.5)

## 2015-03-12 ENCOUNTER — Other Ambulatory Visit: Payer: Self-pay | Admitting: Internal Medicine

## 2015-03-12 DIAGNOSIS — B9620 Unspecified Escherichia coli [E. coli] as the cause of diseases classified elsewhere: Secondary | ICD-10-CM

## 2015-03-12 DIAGNOSIS — N390 Urinary tract infection, site not specified: Principal | ICD-10-CM

## 2015-03-12 LAB — URINE CULTURE: Colony Count: 50000

## 2015-03-12 LAB — NMR LIPOPROFILE WITH LIPIDS
Cholesterol, Total: 157 mg/dL (ref 100–199)
HDL Particle Number: 27.2 umol/L — ABNORMAL LOW (ref >=30.5)
HDL SIZE: 8.4 nm — AB (ref >=9.2)
HDL-C: 38 mg/dL — AB (ref >39)
LDL (calc): 60 mg/dL (ref 0–99)
LDL PARTICLE NUMBER: 1182 nmol/L — AB (ref <1000)
LDL Size: 20.4 nm (ref >=20.8)
LP-IR Score: 81 — ABNORMAL HIGH (ref <=45)
Large HDL-P: 2.1 umol/L — ABNORMAL LOW (ref >=4.8)
Large VLDL-P: 13.2 nmol/L — ABNORMAL HIGH (ref <=2.7)
Small LDL Particle Number: 666 nmol/L — ABNORMAL HIGH (ref <=527)
TRIGLYCERIDES: 294 mg/dL — AB (ref 0–149)
VLDL SIZE: 51.5 nm — AB (ref <=46.6)

## 2015-03-12 MED ORDER — NITROFURANTOIN MONOHYD MACRO 100 MG PO CAPS: 100.0000 mg | ORAL_CAPSULE | Freq: Two times a day (BID) | ORAL | Status: DC

## 2015-05-09 ENCOUNTER — Other Ambulatory Visit: Payer: Self-pay

## 2015-07-23 ENCOUNTER — Ambulatory Visit (INDEPENDENT_AMBULATORY_CARE_PROVIDER_SITE_OTHER): Payer: Medicare Other | Admitting: Physician Assistant

## 2015-07-23 VITALS — BP 138/60 | HR 69 | Temp 98.3°F | Resp 18 | Ht 64.0 in | Wt 202.2 lb

## 2015-07-23 DIAGNOSIS — H6092 Unspecified otitis externa, left ear: Secondary | ICD-10-CM | POA: Diagnosis not present

## 2015-07-23 DIAGNOSIS — E119 Type 2 diabetes mellitus without complications: Secondary | ICD-10-CM

## 2015-07-23 MED ORDER — OFLOXACIN 0.3 % OT SOLN: 10.0000 [drp] | Freq: Every day | OTIC | Status: AC

## 2015-07-23 NOTE — Patient Instructions (Signed)
Take ibuprofen/tylenol for pain. Put floxin 10 drops in left ear once a day for 7 days. Lay on your side for at least 10 minutes after putting drops in ear. Keep ear dry as best as possible. Do not put q-tips or other foreign objects in your ear. Return if your sx are not starting to improve in 48 hours or at any time if you develop worsening pain, fever, chills, hearing loss.

## 2015-07-23 NOTE — Progress Notes (Signed)
[facility] [address], [place] [address] [phone]  Date:  07/23/2015   Name:  Danielle Murray   DOB:  December 08, 1940   MRN:  [id number]  PCP:  Unice Cobble, MD    Chief Complaint: Otalgia   History of Present Illness:  This is a 74 y.o. female with PMH DM2, HTN, HLD, CAD, SVT, hypothyroidism who is presenting with left ear pain x 24 hours. She states the ear is tender to the touch. She bought OTC ear drops for pain but states they didn't stay in her ear d/t swelling. She tried to put a q-tip in her ear but wouldn't fit and too painful. No drainage from the ear. She denies URI sx, fever, chills, hearing loss.  Pt was referred by PCP, Dr. Unice Cobble, to endocrinologist for management of her DM. 02/2015 her A1C was 9.2. She states 3 months ago when she saw endo her A1C was 8. Next appt with endo in November.  Review of Systems:  Review of Systems See HPI  Patient Active Problem List   Diagnosis Date Noted  . CAD (coronary artery disease), native coronary artery 09/04/2013  . Exertional angina 09/04/2013  . SVT (supraventricular tachycardia) 09/04/2013  . Chest pain 08/31/2013  . Type 2 diabetes, uncontrolled, with peripheral circulatory disorder 08/07/2012  . Diverticulosis 08/07/2012  . Elevated lipids 01/09/2008  . Essential hypertension 01/09/2008  . Hypothyroidism 10/24/2007  . DEPRESSION 10/24/2007    Prior to Admission medications   Medication Sig Start Date End Date Taking? Authorizing Provider  amLODipine (NORVASC) 5 MG tablet TAKE 1/2 TABLET (2.5 MG TOTAL) BY MOUTH DAILY. 03/09/15  Yes Hendricks Limes, MD  carvedilol (COREG) 3.125 MG tablet TAKE 1 TABLET (3.125 MG TOTAL) BY MOUTH 2 (TWO) TIMES DAILY. 10/28/14  Yes Josue Hector, MD  clopidogrel (PLAVIX) 75 MG tablet Take 1 tablet (75 mg total) by mouth daily. 10/28/14  Yes Josue Hector, MD  insulin NPH Human (HUMULIN N,NOVOLIN N) 100 UNIT/ML injection Inject 50 Units into the skin  once.   Yes Historical Provider, MD  levothyroxine (SYNTHROID, LEVOTHROID) 75 MCG tablet Take 75 mcg by mouth daily. M-F only   Yes Historical Provider, MD  losartan-hydrochlorothiazide (HYZAAR) 100-25 MG per tablet Take 1 tablet by mouth daily.   Yes Historical Provider, MD  metFORMIN (GLUCOPHAGE) 500 MG tablet Take 1 tablet (500 mg total) by mouth daily. 09/06/13  Yes Dayna N Dunn, PA-C  MULTIPLE VITAMIN PO Take 1 tablet by mouth daily.   Yes Historical Provider, MD  nitroGLYCERIN (NITROSTAT) 0.4 MG SL tablet Place 1 tablet (0.4 mg total) under the tongue every 5 (five) minutes as needed for chest pain. 10/28/14  Yes Josue Hector, MD           Allergies  Allergen Reactions  . Penicillins     Diffuse joint pain @ age 47 in context of Scarlet Fever    Past Surgical History  Procedure Laterality Date  . Total knee arthroplasty Left 06/2005    Dr Gladstone Lighter  . Colonoscopy  2003    Tics  . Abdominal hysterectomy  1989    no BSO; dysfunctional menses  . Cataract extraction w/ intraocular lens  implant, bilateral Bilateral 2014  . Coronary angioplasty with stent placement  09/03/2013    "1" (09/03/2013)  . Left heart catheterization with coronary angiogram N/A 09/03/2013    Procedure: LEFT HEART CATHETERIZATION WITH CORONARY ANGIOGRAM;  Surgeon: Blane Ohara, MD;  Location: [facility] CATH [facility];  Service: Cardiovascular;  Laterality: N/A;    Social History  Substance Use Topics  . Smoking status: Never Smoker   . Smokeless tobacco: Never Used  . Alcohol Use: Yes     Comment: 09/03/2013 "1-2 times/yr"    Family History  Problem Relation Age of Onset  . Asthma Mother   . Hypertension Mother   . Heart failure Mother   . Hypertension Father   . Stroke Father 39  . Heart attack Father 67  . Hypertension Sister   . Hyperlipidemia Sister   . Heart attack Sister     pre 33; Twin  . Diabetes Sister     her TWIN  . Hypertension Brother   . Hyperlipidemia Brother   . Coronary artery  disease Brother     stents late 50s    Medication list has been reviewed and updated.  Physical Examination:  Physical Exam  Constitutional: She is oriented to person, place, and time. She appears well-developed and well-nourished. No distress.  HENT:  Head: Normocephalic and atraumatic.  Right Ear: Hearing, tympanic membrane, external ear and ear canal normal.  Left Ear: Hearing normal.  Nose: Nose normal.  Mouth/Throat: Uvula is midline, oropharynx is clear and moist and mucous membranes are normal.  Left ear canal swollen, erythematous and tender. Not completely obstructed. Small amount purulent material partially obstructing TM, superior TM clear. Tenderness with manipulation of auricle and tragus. Posterior ear tenderness. No mastoid tenderness or erythema.  Eyes: Conjunctivae and lids are normal. Right eye exhibits no discharge. Left eye exhibits no discharge. No scleral icterus.  Cardiovascular: Normal rate, regular rhythm, normal heart sounds and normal pulses.   No murmur heard. Pulmonary/Chest: Effort normal and breath sounds normal. No respiratory distress. She has no wheezes. She has no rhonchi. She has no rales.  Musculoskeletal: Normal range of motion.  Lymphadenopathy:       Head (right side): No submental, no submandibular, no tonsillar, no preauricular and no posterior auricular adenopathy present.       Head (left side): No submental, no submandibular, no tonsillar, no preauricular and no posterior auricular adenopathy present.    She has no cervical adenopathy.  Neurological: She is alert and oriented to person, place, and time.  Skin: Skin is warm, dry and intact. No lesion and no rash noted.  Psychiatric: She has a normal mood and affect. Her speech is normal and behavior is normal. Thought content normal.   BP 138/60 mmHg  Pulse 69  Temp(Src) 98.3 F (36.8 C) (Oral)  Resp 18  Ht 5\' 4"  (1.626 m)  Wt 202 lb 3.2 oz (91.717 kg)  BMI 34.69 kg/m2  SpO2  97%  Assessment and Plan:  1. Otitis externa, left Applied 3 drops neomyxin-polymyxin-hydrocortisone drops in office. Pt states symptoms improved some after application. Rx'd floxin otic 10 drops QD x 7 days. Return if sx not improving in 48 hours. Discussed return precautions. - ofloxacin (FLOXIN OTIC) 0.3 % otic solution; Place 10 drops into the left ear daily.  Dispense: 5 mL; Refill: 0  2. Diabetes mellitus type 2, uncomplicated Improving. Follow up with endo.   Benjaman Pott Drenda Freeze, MHS [facility]  07/23/2015

## 2015-08-19 ENCOUNTER — Encounter: Payer: Self-pay | Admitting: Internal Medicine

## 2015-08-19 ENCOUNTER — Ambulatory Visit (INDEPENDENT_AMBULATORY_CARE_PROVIDER_SITE_OTHER): Payer: Medicare Other | Admitting: Internal Medicine

## 2015-08-19 VITALS — BP 126/62 | HR 66 | Temp 97.8°F | Resp 16 | Wt 201.0 lb

## 2015-08-19 DIAGNOSIS — Z23 Encounter for immunization: Secondary | ICD-10-CM | POA: Diagnosis not present

## 2015-08-19 DIAGNOSIS — R1032 Left lower quadrant pain: Secondary | ICD-10-CM

## 2015-08-19 MED ORDER — TRAMADOL HCL 50 MG PO TABS: 50.0000 mg | ORAL_TABLET | Freq: Three times a day (TID) | ORAL | Status: DC | PRN

## 2015-08-19 NOTE — Patient Instructions (Signed)
Soak in a whirlpool if availble to help relieve the soft tissue/musculoskeletal pain.Consider glucosamine sulfate 1500 mg daily for joint symptoms.This will rehydrate the cartilages. The Orthopedic referral will be scheduled and you'll be notified of the time.Please call the Referral Co-Ordinator @ [phone] if you have not been notified of appointment time within 7-10 days.

## 2015-08-19 NOTE — Progress Notes (Signed)
   Subjective:    Patient ID: Danielle Murray, female    DOB: 1941-05-03, 74 y.o.   MRN: [id number]  HPI  She has had pain in the left inguinal area for 3 weeks described as aching and constant. It is level IV @ present ; but with certain movements 8 out of 10. There was no trauma , injury or repetitive motion prior to onset of symptoms. She's been taking 3 ibuprofen twice a day. Significantly she has on Plavix.  She has no GI, GU, constitutional, or neurologic symptoms.  Review of Systems  There is no significant cough as a factor. Unexplained weight loss, abdominal pain, significant dyspepsia, dysphagia, melena, rectal bleeding, or persistently small caliber stools are not present. Dysuria, pyuria, hematuria, frequency, nocturia or polyuria are denied.    Objective:   Physical Exam Pertinent or positive findings include: Abdomen is protuberant. There is no left inguinal hernia. She has pain in the left inguinal crease and upper thigh with rotation of the left upper extremity. She begins to have pain in the left inguinal area with flexion of the knees. She has crepitus of both knees. Reflexes are 0+ at the knees.  General appearance :adequately nourished; in no distress.  Eyes: No conjunctival inflammation or scleral icterus is present.  Heart:  Normal rate and regular rhythm. S1 and S2 normal without gallop, murmur, click, rub or other extra sounds    Lungs:Chest clear to auscultation; no wheezes, rhonchi,rales ,or rubs present.No increased work of breathing.   Abdomen: bowel sounds normal, soft and non-tender without masses, organomegaly or hernias noted.  No guarding or rebound. No flank tenderness to percussion.  Vascular : all pulses equal ; no bruits present.  Skin:Warm & dry.  Intact without suspicious lesions or rashes ; no tenting or jaundice   Lymphatic: No lymphadenopathy is noted about the head, neck, axilla.   Neuro: Strength, tone & DTRs normal.     Assessment &  Plan:  #1 left inguinal pain; clinically this is tendinous in nature.  Plan: See orders and recommendations

## 2015-08-19 NOTE — Progress Notes (Signed)
Pre visit review using our clinic review tool, if applicable. No additional management support is needed unless otherwise documented below in the visit note. 

## 2015-10-12 NOTE — Progress Notes (Signed)
Patient ID: Danielle Murray, female   DOB: August 14, 1941, 74 y.o.   MRN: [id number] Danielle Murray is seen  F/u  CAD with past PCI. Other issues include HLD, IDDM and HTN. She was cathed by Dr. Burt Murray back in October 2014 had 2 vessel disease with proximal/mid LAD and left dominant PD. Treated with stent to the LAD and medical therapy for her residual disease - PDA would be hard pressed to fit 2.0 stent  Had some chest pain at rehab and saw PA 12/14 Norvasc added for BP control Doing better with no chest pain now Twin sister past away in February 2015  and this is Very hard for her  01/05/14  Myovue normal no ischemia EF 63%  03/10/15  LDL 60   ROS: Denies fever, malais, weight loss, blurry vision, decreased visual acuity, cough, sputum, SOB, hemoptysis, pleuritic pain, palpitaitons, heartburn, abdominal pain, melena, lower extremity edema, claudication, or rash.  All other systems reviewed and negative  General: Affect appropriate Healthy:  appears stated age 29: normal Neck supple with no adenopathy JVP normal no bruits no thyromegaly Lungs clear with no wheezing and good diaphragmatic motion Heart:  S1/S2 no murmur, no rub, gallop or click PMI normal Abdomen: benighn, BS positve, no tenderness, no AAA no bruit.  No HSM or HJR Distal pulses intact with no bruits No edema Neuro non-focal Skin warm and dry No muscular weakness   Current Outpatient Prescriptions  Medication Sig Dispense Refill  . amLODipine (NORVASC) 5 MG tablet TAKE 1/2 TABLET (2.5 MG TOTAL) BY MOUTH DAILY. 90 tablet 3  . carvedilol (COREG) 3.125 MG tablet TAKE 1 TABLET (3.125 MG TOTAL) BY MOUTH 2 (TWO) TIMES DAILY. 180 tablet 3  . clopidogrel (PLAVIX) 75 MG tablet Take 1 tablet (75 mg total) by mouth daily. 90 tablet 3  . insulin NPH Human (HUMULIN N,NOVOLIN N) 100 UNIT/ML injection Inject 50 Units into the skin once.    Marland Kitchen levothyroxine (SYNTHROID, LEVOTHROID) 75 MCG tablet Take 75 mcg by mouth daily. M-S only    .  losartan-hydrochlorothiazide (HYZAAR) 100-25 MG per tablet Take 1 tablet by mouth daily.    . metFORMIN (GLUCOPHAGE) 500 MG tablet Take 1 tablet (500 mg total) by mouth daily.    . MULTIPLE VITAMIN PO Take 1 tablet by mouth daily.    . nitroGLYCERIN (NITROSTAT) 0.4 MG SL tablet Place 0.4 mg under the tongue every 5 (five) minutes as needed for chest pain (3 doses max).    . traMADol (ULTRAM) 50 MG tablet Take 50 mg by mouth every 6 (six) hours as needed for moderate pain or severe pain.     No current facility-administered medications for this visit.    Allergies  Penicillins  Electrocardiogram:  12/14  SR rate 67 low voltage PAC  10/28/14 SR rate 73  Low voltage normal no change  10/13/15 SR rate 68 low voltage   Assessment and Plan CAD: Stable with no angina and good activity level.  Continue medical Rx new nitro called in DM: Discussed low carb diet.  Target hemoglobin A1c is 6.5 or less.  Continue current medications. Sees Danielle Murray  HTN: Well controlled.  Continue current medications and low sodium Dash type diet.   Thyroid  Continue replacement sees Danielle Murray for labs   F/u 6 months   Danielle Murray

## 2015-10-13 ENCOUNTER — Encounter: Payer: Self-pay | Admitting: Cardiovascular Disease

## 2015-10-13 ENCOUNTER — Ambulatory Visit (INDEPENDENT_AMBULATORY_CARE_PROVIDER_SITE_OTHER): Payer: Medicare Other | Admitting: Cardiovascular Disease

## 2015-10-13 VITALS — BP 126/70 | HR 68 | Ht 64.0 in | Wt 199.4 lb

## 2015-10-13 DIAGNOSIS — I2510 Atherosclerotic heart disease of native coronary artery without angina pectoris: Secondary | ICD-10-CM | POA: Diagnosis not present

## 2015-10-13 MED ORDER — CLOPIDOGREL BISULFATE 75 MG PO TABS: 75.0000 mg | ORAL_TABLET | Freq: Every day | ORAL | Status: DC

## 2015-10-13 MED ORDER — CARVEDILOL 3.125 MG PO TABS: ORAL_TABLET | ORAL | Status: DC

## 2015-10-13 MED ORDER — NITROGLYCERIN 0.4 MG SL SUBL: 0.4000 mg | SUBLINGUAL_TABLET | SUBLINGUAL | Status: DC | PRN

## 2015-10-13 NOTE — Patient Instructions (Signed)

## 2015-10-26 ENCOUNTER — Other Ambulatory Visit: Payer: Self-pay | Admitting: Cardiovascular Disease

## 2015-10-31 ENCOUNTER — Ambulatory Visit: Payer: Medicare Other | Admitting: Cardiovascular Disease

## 2015-11-03 ENCOUNTER — Other Ambulatory Visit: Payer: Self-pay | Admitting: Cardiovascular Disease

## 2015-11-03 NOTE — Telephone Encounter (Signed)
carvedilol (COREG) 3.125 MG tablet  Medication   Date: 10/13/2015  Department: [address] [facility]  Ordering/Authorizing: Josue Hector, MD      Order Providers    Prescribing Provider Encounter Provider   Josue Hector, MD Josue Hector, MD    Medication Detail      Disp Refills Start End     carvedilol (COREG) 3.125 MG tablet 180 tablet 3 10/13/2015     Sig: TAKE 1 TABLET (3.125 MG TOTAL) BY MOUTH 2 (TWO) TIMES DAILY.    E-Prescribing Status: Receipt confirmed by pharmacy (10/13/2015 3:38 PM EST)     Pharmacy    [id number] - [place], [place] filled   amLODipine (NORVASC) 5 MG tablet  Medication   Date: 03/09/2015  Department: [facility]  Ordering/Authorizing: Hendricks Limes, MD      Order Providers    Documenting Provider Encounter Provider   Hendricks Limes, MD Hendricks Limes, MD    Medication Detail      Disp Refills Start End     amLODipine (NORVASC) 5 MG tablet 90 tablet 3 03/09/2015     Sig: TAKE 1/2 TABLET (2.5 MG TOTAL) BY MOUTH DAILY.    Class: Historical Med     Pharmacy    [id number] - [place], [place] by PCP

## 2016-03-10 ENCOUNTER — Ambulatory Visit (INDEPENDENT_AMBULATORY_CARE_PROVIDER_SITE_OTHER): Payer: Medicare Other | Admitting: Family Medicine

## 2016-03-10 VITALS — BP 124/82 | HR 78 | Temp 98.9°F | Resp 16 | Ht 64.0 in | Wt 199.0 lb

## 2016-03-10 DIAGNOSIS — L03213 Periorbital cellulitis: Secondary | ICD-10-CM | POA: Diagnosis not present

## 2016-03-10 HISTORY — DX: Periorbital cellulitis: L03.213

## 2016-03-10 MED ORDER — CEFUROXIME AXETIL 500 MG PO TABS: 500.0000 mg | ORAL_TABLET | Freq: Two times a day (BID) | ORAL | Status: DC

## 2016-03-10 MED ORDER — POLYMYXIN B-TRIMETHOPRIM 10000-0.1 UNIT/ML-% OP SOLN: 2.0000 [drp] | OPHTHALMIC | Status: DC

## 2016-03-10 NOTE — Addendum Note (Signed)
Addended by: Gregor Hams on: 03/10/2016 09:32 AM   Modules accepted: Orders

## 2016-03-10 NOTE — Progress Notes (Signed)
Danielle Murray is a 75 y.o. female who presents to Urgent Care today for right eyelid irritation. Patient is a 5 day history of swelling and pain of the right upper eyelid. She denies any blurry vision or injury. No fevers chills nausea vomiting or diarrhea. She has tried warm and cold compresses as well as some over-the-counter dry eye eyedrops. She feels well otherwise.   Past Medical History  Diagnosis Date  . Hypertension   . Hyperlipidemia   . HYPOTHYROIDISM   . DEPRESSION   . Diverticulosis   . Coronary artery disease     a. 08/2013: unstable angina s/p PTCA/DES to LAD, medical therapy for residual severe stenosis of a mid-distal left PDA branch of large dominant LCx, moderate RCA disease (consider PCI of L-PDA if she fails med rx).  . Type II diabetes mellitus ([place])     Dr Chalmers Cater  . Arthritis     "fingers, all my joints" (09/03/2013)  . SVT (supraventricular tachycardia) ([place])     a. very brief transient SVT during 08/2013 admission overnight.   Past Surgical History  Procedure Laterality Date  . Total knee arthroplasty Left 06/2005    Dr Gladstone Lighter  . Colonoscopy  2003    Tics  . Abdominal hysterectomy  1989    no BSO; dysfunctional menses  . Cataract extraction w/ intraocular lens  implant, bilateral Bilateral 2014  . Coronary angioplasty with stent placement  09/03/2013    "1" (09/03/2013)  . Left heart catheterization with coronary angiogram N/A 09/03/2013    Procedure: LEFT HEART CATHETERIZATION WITH CORONARY ANGIOGRAM;  Surgeon: Blane Ohara, MD;  Location: [facility] CATH [facility];  Service: Cardiovascular;  Laterality: N/A;   Social History  Substance Use Topics  . Smoking status: Never Smoker   . Smokeless tobacco: Never Used  . Alcohol Use: Yes     Comment: 09/03/2013 "1-2 times/yr"   ROS as above Medications: Current Outpatient Prescriptions  Medication Sig Dispense Refill  . amLODipine (NORVASC) 5 MG tablet TAKE 1/2 TABLET (2.5 MG TOTAL) BY MOUTH DAILY. 90 tablet 3   . carvedilol (COREG) 3.125 MG tablet TAKE 1 TABLET (3.125 MG TOTAL) BY MOUTH 2 (TWO) TIMES DAILY. 180 tablet 3  . clopidogrel (PLAVIX) 75 MG tablet Take 1 tablet (75 mg total) by mouth daily. 90 tablet 3  . insulin NPH Human (HUMULIN N,NOVOLIN N) 100 UNIT/ML injection Inject 50 Units into the skin once.    Marland Kitchen levothyroxine (SYNTHROID, LEVOTHROID) 75 MCG tablet Take 75 mcg by mouth daily. M-S only    . losartan-hydrochlorothiazide (HYZAAR) 100-25 MG per tablet Take 1 tablet by mouth daily.    . metFORMIN (GLUCOPHAGE) 500 MG tablet Take 1 tablet (500 mg total) by mouth daily.    . MULTIPLE VITAMIN PO Take 1 tablet by mouth daily.    . nitroGLYCERIN (NITROSTAT) 0.4 MG SL tablet Place 1 tablet (0.4 mg total) under the tongue every 5 (five) minutes as needed for chest pain (3 doses max). 25 tablet 3  . cefUROXime (CEFTIN) 500 MG tablet Take 1 tablet (500 mg total) by mouth 2 (two) times daily with a meal. 14 tablet 0  . traMADol (ULTRAM) 50 MG tablet Take 50 mg by mouth every 6 (six) hours as needed for moderate pain or severe pain. Reported on 03/10/2016    . trimethoprim-polymyxin b (POLYTRIM) ophthalmic solution Place 2 drops into the right eye every 4 (four) hours. 10 mL 0   No current facility-administered medications for this visit.  Allergies  Allergen Reactions  . Penicillins     Diffuse joint pain @ age 3 in context of Scarlet Fever     Exam:  BP 124/82 mmHg  Pulse 78  Temp(Src) 98.9 F (37.2 C) (Oral)  Resp 16  Ht 5\' 4"  (1.626 m)  Wt 199 lb (90.266 kg)  BMI 34.14 kg/m2  SpO2 95% Gen: Well NAD HEENT: EOMI,  MMM Right upper eyelid is erythematous and tender without masses. The eye itself is normal-appearing with no conjunctival injection. I motion bilaterally. Lungs: Normal work of breathing. CTABL Heart: RRR no MRG Abd: NABS, Soft. Nondistended, Nontender Exts: Brisk capillary refill, warm and well perfused.   No results found for this or any previous visit (from the past  24 hour(s)). No results found.  Assessment and Plan: 75 y.o. female with preseptal cellulitis. Discussed options. Empiric treatment with Ceftin oral antibiotics as well as topical trim eyedrops. Continue warm compresses. Follow-up with ophthalmology if not improving.  Discussed warning signs or symptoms. Please see discharge instructions. Patient expresses understanding.

## 2016-03-10 NOTE — Patient Instructions (Addendum)
Thank you for coming in today. Take the oral antibiotics twice daily for 1 week.,  Use the topical eye drops.  Continue warm compress.  Follow up with your eye doctor if not better   Preseptal Cellulitis, Adult Preseptal cellulitis--also called periorbital cellulitis--is an infection that can affect your eyelid and the soft tissues or skin that surround your eye. The infection may also affect the structures that produce and drain your tears. It does not affect your eye itself. CAUSES This condition may be caused by:  Bacterial infection.  Long-term (chronic) sinus infections.  An object (foreign body) that is stuck behind the eye.  An injury that:  Goes through the eyelid tissues.  Causes an infection, such as an insect sting.  Fracture of the bone around the eye.  Infections that have spread from the eyelid or other structures around the eye.  Bite wounds.  Inflammation or infection of the lining membranes of the brain (meningitis).  An infection in the blood (septicemia).  Dental infection (abscess).  Viral infection. This is rare. RISK FACTORS Risk factors for preseptal cellulitis include:  Participating in activities that increase your risk of trauma to the face or head, such as boxing or high-speed activities.  Having a weakened defense system (immune system).  Medical conditions, such as nasal polyps, that increase your risk for frequent or recurrent sinus infections.  Not receiving regular dental care. SYMPTOMS Symptoms of this condition usually come on suddenly. Symptoms may include:  Red, hot, and swollen eyelids.  Fever.  Difficulty opening your eye.  Eye pain. DIAGNOSIS This condition may be diagnosed by an eye exam. You may also have tests, such as:  Blood tests.  CT scan.  MRI.  Spinal tap (lumbar puncture). This is a procedure that involves removing and examining a small amount of the fluid that surrounds the brain and spinal cord. This  checks for meningitis. TREATMENT Treatment for this condition will include antibiotic medicines. These may be given by mouth (orally), through an IV, or as a shot. Your health care provider may also recommend nasal decongestants to reduce swelling. HOME CARE INSTRUCTIONS  Take your antibiotic medicine as directed by your health care provider. Finish all of it even if you start to feel better.  Take medicines only as directed by your health care provider.  Drink enough fluid to keep your urine clear or pale yellow.  Do not use any tobacco products, including cigarettes, chewing tobacco, or electronic cigarettes. If you need help quitting, ask your health care provider.  Keep all follow-up visits as directed by your health care provider. These include any visits with an eye specialist (ophthalmologist) or dentist. SEEK MEDICAL CARE IF:  You have a fever.  Your eyelids become more red, warm, or swollen.  You have new symptoms.  Your symptoms do not get better with treatment. SEEK IMMEDIATE MEDICAL CARE IF:  You develop double vision, or your vision becomes blurred or worsens in any way.  You have trouble moving your eyes.  Your eye looks like it is sticking out or bulging out (proptosis).  You develop a severe headache, severe neck pain, or neck stiffness.  You develop repeated vomiting.   This information is not intended to replace advice given to you by your health care provider. Make sure you discuss any questions you have with your health care provider.   Document Released: 12/01/2010 Document Revised: 03/15/2015 Document Reviewed: 10/25/2014 Elsevier Interactive Patient Education 2016 [employer].    IF you  received an x-ray today, you will receive an invoice from [facility]. Please contact [facility] at [phone] with questions or concerns regarding your invoice.   IF you received labwork today, you will receive an invoice from [employer]. Please contact [facility] at [phone] with questions or concerns regarding your invoice.   Our billing staff will not be able to assist you with questions regarding bills from these companies.  You will be contacted with the lab results as soon as they are available. The fastest way to get your results is to activate your My Chart account. Instructions are located on the last page of this paperwork. If you have not heard from [place] regarding the results in 2 weeks, please contact this office.

## 2016-04-07 NOTE — Progress Notes (Signed)
Patient ID: Danielle Murray, female   DOB: Mar 14, 1941, 75 y.o.   MRN: [id number] Ms. Brickell is seen  F/u  CAD with past PCI. Other issues include HLD, IDDM and HTN. She was cathed by Dr. Burt Knack back in October 2014 had 2 vessel disease with proximal/mid LAD and left dominant PD. Treated with stent to the LAD and medical therapy for her residual disease - PDA would be hard pressed to fit 2.0 stent  Had some chest pain at rehab and saw PA 12/14 Norvasc added for BP control Doing better with no chest pain now Twin sister past away in February 2015  and this is Very hard for her  01/05/14  Myovue normal no ischemia EF 63%  03/10/15  LDL 60  Having some LE dependant edema.  Right ankle mostly  A1c too high Dr Soyla Murphy recently increased morning insulin  ROS: Denies fever, malais, weight loss, blurry vision, decreased visual acuity, cough, sputum, SOB, hemoptysis, pleuritic pain, palpitaitons, heartburn, abdominal pain, melena, lower extremity edema, claudication, or rash.  All other systems reviewed and negative  General: Affect appropriate Healthy:  appears stated age 62: normal Neck supple with no adenopathy JVP normal no bruits no thyromegaly Lungs clear with no wheezing and good diaphragmatic motion Heart:  S1/S2 no murmur, no rub, gallop or click PMI normal Abdomen: benighn, BS positve, no tenderness, no AAA no bruit.  No HSM or HJR Distal pulses intact with no bruits No edema Neuro non-focal Skin warm and dry No muscular weakness   Current Outpatient Prescriptions  Medication Sig Dispense Refill  . amLODipine (NORVASC) 5 MG tablet TAKE 1/2 TABLET (2.5 MG TOTAL) BY MOUTH DAILY. 90 tablet 3  . carvedilol (COREG) 3.125 MG tablet TAKE 1 TABLET (3.125 MG TOTAL) BY MOUTH 2 (TWO) TIMES DAILY. 180 tablet 3  . clopidogrel (PLAVIX) 75 MG tablet Take 1 tablet (75 mg total) by mouth daily. 90 tablet 3  . insulin NPH Human (HUMULIN N,NOVOLIN N) 100 UNIT/ML injection Inject 50 Units into the  skin once.    Marland Kitchen levothyroxine (SYNTHROID, LEVOTHROID) 75 MCG tablet Take 75 mcg by mouth daily. M-S only    . losartan-hydrochlorothiazide (HYZAAR) 100-25 MG per tablet Take 1 tablet by mouth daily.    . metFORMIN (GLUCOPHAGE) 500 MG tablet Take 1 tablet (500 mg total) by mouth daily.    . MULTIPLE VITAMIN PO Take 1 tablet by mouth daily.    . nitroGLYCERIN (NITROSTAT) 0.4 MG SL tablet Place 1 tablet (0.4 mg total) under the tongue every 5 (five) minutes as needed for chest pain (3 doses max). 25 tablet 3   No current facility-administered medications for this visit.    Allergies  Penicillins  Electrocardiogram:  12/14  SR rate 67 low voltage PAC  10/28/14 SR rate 73  Low voltage normal no change  10/13/15 SR rate 68 low voltage   Assessment and Plan CAD: Stable with no angina and good activity level.  Continue medical Rx new nitro called in DM: Discussed low carb diet.  Target hemoglobin A1c is 6.5 or less.  Continue current medications. Sees Balin  Insulin adjusted  HTN: Well controlled.  Continue current medications and low sodium Dash type diet.   Thyroid  Continue replacement sees Balin for labs  Edema:  Can split Hyzaar and add separate diuretic in future if needed discussed low sodium diet and elevation / support hose   F/u 6 months   Jenkins Rouge

## 2016-04-11 ENCOUNTER — Encounter: Payer: Self-pay | Admitting: Cardiovascular Disease

## 2016-04-11 ENCOUNTER — Ambulatory Visit (INDEPENDENT_AMBULATORY_CARE_PROVIDER_SITE_OTHER): Payer: Medicare Other | Admitting: Cardiovascular Disease

## 2016-04-11 VITALS — BP 120/60 | HR 69 | Ht 64.0 in | Wt 202.8 lb

## 2016-04-11 DIAGNOSIS — I2510 Atherosclerotic heart disease of native coronary artery without angina pectoris: Secondary | ICD-10-CM | POA: Diagnosis not present

## 2016-04-11 NOTE — Patient Instructions (Signed)

## 2016-04-12 ENCOUNTER — Other Ambulatory Visit: Payer: Self-pay

## 2016-05-05 ENCOUNTER — Other Ambulatory Visit: Payer: Self-pay | Admitting: Cardiovascular Disease

## 2016-10-17 ENCOUNTER — Other Ambulatory Visit: Payer: Self-pay | Admitting: Cardiovascular Disease

## 2016-11-22 DIAGNOSIS — E039 Hypothyroidism, unspecified: Secondary | ICD-10-CM | POA: Diagnosis not present

## 2016-11-22 DIAGNOSIS — E1165 Type 2 diabetes mellitus with hyperglycemia: Secondary | ICD-10-CM | POA: Diagnosis not present

## 2016-11-22 DIAGNOSIS — E7800 Pure hypercholesterolemia, unspecified: Secondary | ICD-10-CM | POA: Diagnosis not present

## 2016-11-22 DIAGNOSIS — I10 Essential (primary) hypertension: Secondary | ICD-10-CM | POA: Diagnosis not present

## 2016-11-30 DIAGNOSIS — E1165 Type 2 diabetes mellitus with hyperglycemia: Secondary | ICD-10-CM | POA: Diagnosis not present

## 2016-12-25 DIAGNOSIS — Z0000 Encounter for general adult medical examination without abnormal findings: Secondary | ICD-10-CM | POA: Diagnosis not present

## 2016-12-25 DIAGNOSIS — E7800 Pure hypercholesterolemia, unspecified: Secondary | ICD-10-CM | POA: Diagnosis not present

## 2016-12-25 DIAGNOSIS — N390 Urinary tract infection, site not specified: Secondary | ICD-10-CM | POA: Diagnosis not present

## 2016-12-25 DIAGNOSIS — E559 Vitamin D deficiency, unspecified: Secondary | ICD-10-CM | POA: Diagnosis not present

## 2016-12-25 DIAGNOSIS — E1165 Type 2 diabetes mellitus with hyperglycemia: Secondary | ICD-10-CM | POA: Diagnosis not present

## 2016-12-28 ENCOUNTER — Other Ambulatory Visit: Payer: Self-pay | Admitting: Internal Medicine

## 2016-12-28 DIAGNOSIS — Z1231 Encounter for screening mammogram for malignant neoplasm of breast: Secondary | ICD-10-CM

## 2016-12-28 DIAGNOSIS — E1165 Type 2 diabetes mellitus with hyperglycemia: Secondary | ICD-10-CM | POA: Diagnosis not present

## 2016-12-28 DIAGNOSIS — H6122 Impacted cerumen, left ear: Secondary | ICD-10-CM | POA: Diagnosis not present

## 2016-12-28 DIAGNOSIS — Z0001 Encounter for general adult medical examination with abnormal findings: Secondary | ICD-10-CM | POA: Diagnosis not present

## 2016-12-28 DIAGNOSIS — I10 Essential (primary) hypertension: Secondary | ICD-10-CM | POA: Diagnosis not present

## 2016-12-28 DIAGNOSIS — E039 Hypothyroidism, unspecified: Secondary | ICD-10-CM | POA: Diagnosis not present

## 2016-12-28 DIAGNOSIS — Z23 Encounter for immunization: Secondary | ICD-10-CM | POA: Diagnosis not present

## 2016-12-28 DIAGNOSIS — Z1212 Encounter for screening for malignant neoplasm of rectum: Secondary | ICD-10-CM | POA: Diagnosis not present

## 2017-01-16 ENCOUNTER — Ambulatory Visit
Admission: RE | Admit: 2017-01-16 | Discharge: 2017-01-16 | Disposition: A | Discharge status: Home / Self Care | Payer: PPO | Source: Ambulatory Visit | Attending: Internal Medicine | Admitting: Internal Medicine

## 2017-01-16 DIAGNOSIS — Z1231 Encounter for screening mammogram for malignant neoplasm of breast: Secondary | ICD-10-CM | POA: Diagnosis not present

## 2017-01-18 ENCOUNTER — Other Ambulatory Visit: Payer: Self-pay | Admitting: Internal Medicine

## 2017-01-18 DIAGNOSIS — N6012 Diffuse cystic mastopathy of left breast: Secondary | ICD-10-CM | POA: Diagnosis not present

## 2017-01-18 DIAGNOSIS — R928 Other abnormal and inconclusive findings on diagnostic imaging of breast: Secondary | ICD-10-CM

## 2017-01-22 ENCOUNTER — Other Ambulatory Visit: Payer: Self-pay | Admitting: Internal Medicine

## 2017-01-22 ENCOUNTER — Ambulatory Visit
Admission: RE | Admit: 2017-01-22 | Discharge: 2017-01-22 | Disposition: A | Discharge status: Home / Self Care | Payer: PPO | Source: Ambulatory Visit | Attending: Internal Medicine | Admitting: Internal Medicine

## 2017-01-22 DIAGNOSIS — R928 Other abnormal and inconclusive findings on diagnostic imaging of breast: Secondary | ICD-10-CM

## 2017-01-22 DIAGNOSIS — N6002 Solitary cyst of left breast: Secondary | ICD-10-CM | POA: Diagnosis not present

## 2017-01-22 DIAGNOSIS — Z961 Presence of intraocular lens: Secondary | ICD-10-CM | POA: Diagnosis not present

## 2017-01-22 DIAGNOSIS — H35031 Hypertensive retinopathy, right eye: Secondary | ICD-10-CM | POA: Diagnosis not present

## 2017-01-22 DIAGNOSIS — E119 Type 2 diabetes mellitus without complications: Secondary | ICD-10-CM | POA: Diagnosis not present

## 2017-01-22 DIAGNOSIS — H43812 Vitreous degeneration, left eye: Secondary | ICD-10-CM | POA: Diagnosis not present

## 2017-02-11 DIAGNOSIS — I10 Essential (primary) hypertension: Secondary | ICD-10-CM | POA: Diagnosis not present

## 2017-02-11 DIAGNOSIS — L309 Dermatitis, unspecified: Secondary | ICD-10-CM | POA: Diagnosis not present

## 2017-03-22 DIAGNOSIS — E1165 Type 2 diabetes mellitus with hyperglycemia: Secondary | ICD-10-CM | POA: Diagnosis not present

## 2017-03-22 DIAGNOSIS — E7800 Pure hypercholesterolemia, unspecified: Secondary | ICD-10-CM | POA: Diagnosis not present

## 2017-03-22 DIAGNOSIS — E039 Hypothyroidism, unspecified: Secondary | ICD-10-CM | POA: Diagnosis not present

## 2017-03-22 DIAGNOSIS — I10 Essential (primary) hypertension: Secondary | ICD-10-CM | POA: Diagnosis not present

## 2017-04-15 NOTE — Progress Notes (Signed)
Patient ID: Danielle Murray, female   DOB: 05-Aug-1941, 76 y.o.   MRN: [id number] Ms. Abramovich is seen  F/u  CAD with past PCI. Other issues include HLD, IDDM and HTN. She was cathed by Dr. Burt Knack back in October 2014 had 2 vessel disease with proximal/mid LAD and left dominant PD. Treated with stent to the LAD and medical therapy for her residual disease - PDA would be hard pressed to fit 2.0 stent  Twin sister past away in February 2015  and this is Very hard for her  01/05/14  Myovue normal no ischemia EF 63%   Having some LE dependant edema.  Right ankle mostly Better with PRN lasix  Can;t walk as far as usual More exertional dyspnea No tightness in chest but Exertional dyspnea is worse last month   A1c too high Dr Soyla Murphy managing insulin   ROS: Denies fever, malais, weight loss, blurry vision, decreased visual acuity, cough, sputum, SOB, hemoptysis, pleuritic pain, palpitaitons, heartburn, abdominal pain, melena, lower extremity edema, claudication, or rash.  All other systems reviewed and negative  General: BP 130/64   Pulse 73   Ht 5\' 4"  (1.626 m)   Wt 90.8 kg (200 lb 1.9 oz)   SpO2 94%   BMI 34.35 kg/m  Affect appropriate Healthy:  appears stated age 80: normal Neck supple with no adenopathy JVP normal no bruits no thyromegaly Lungs clear with no wheezing and good diaphragmatic motion Heart:  S1/S2 no murmur, no rub, gallop or click PMI normal Abdomen: benighn, BS positve, no tenderness, no AAA no bruit.  No HSM or HJR Distal pulses intact with no bruits Trace bilateral LE edema slightly worse on right  Neuro non-focal Skin warm and dry No muscular weakness    Current Outpatient Prescriptions  Medication Sig Dispense Refill  . amLODipine (NORVASC) 5 MG tablet TAKE 1 TABLET (5 MG TOTAL) BY MOUTH DAILY. 90 tablet 3  . atorvastatin (LIPITOR) 40 MG tablet Take 40 mg by mouth daily.    . carvedilol (COREG) 3.125 MG tablet TAKE 1 TABLET (3.125 MG TOTAL) BY MOUTH 2  (TWO) TIMES DAILY. 180 tablet 0  . Cholecalciferol (VITAMIN D3 PO) Take 1 tablet by mouth daily.    . clopidogrel (PLAVIX) 75 MG tablet TAKE 1 TABLET BY MOUTH DAILY 90 tablet 0  . furosemide (LASIX) 40 MG tablet Take 40 mg by mouth daily as needed for fluid or edema.    Marland Kitchen glimepiride (AMARYL) 4 MG tablet Take 4 mg by mouth 2 (two) times daily.    . insulin NPH Human (HUMULIN N,NOVOLIN N) 100 UNIT/ML injection Inject 50 Units into the skin once.    Marland Kitchen levothyroxine (SYNTHROID, LEVOTHROID) 88 MCG tablet Take 88 mcg by mouth daily before breakfast.    . losartan-hydrochlorothiazide (HYZAAR) 100-25 MG per tablet Take 1 tablet by mouth daily.    . metFORMIN (GLUCOPHAGE) 500 MG tablet Take 1 tablet (500 mg total) by mouth daily.    . MULTIPLE VITAMIN PO Take 1 tablet by mouth daily.    . nitroGLYCERIN (NITROSTAT) 0.4 MG SL tablet Place 1 tablet (0.4 mg total) under the tongue every 5 (five) minutes as needed for chest pain (3 doses max). 25 tablet 3  . Probiotic Product (PROBIOTIC PO) Take 1 tablet by mouth daily.     No current facility-administered medications for this visit.     Allergies  Penicillins  Electrocardiogram:  12/14  SR rate 67 low voltage PAC  10/28/14 SR rate 73  Low voltage normal no change  10/13/15 SR rate 68 low voltage 04/22/17  SR rate 72 Low voltage poor R wave   Assessment and Plan CAD: more exertional dyspnea in diabetic f/u exercise myovue r/o anginal equivalent known Incomplete revascularization  Echo to reassess EF  DM: Discussed low carb diet.  Target hemoglobin A1c is 6.5 or less.  Continue current medications. Sees Balin  Insulin adjusted  HTN: Well controlled.  Continue current medications and low sodium Dash type diet.   Thyroid  Continue replacement sees Balin for labs  Edema:  Continue Hyzaar and PRN lasix  elevation / support hose  Cholesterol:  Continue lipitor labs with primar y  F/u 6 months  Ex Myovue and Echo ordered   [employer]

## 2017-04-19 ENCOUNTER — Other Ambulatory Visit: Payer: Self-pay | Admitting: Cardiovascular Disease

## 2017-04-22 ENCOUNTER — Encounter: Payer: Self-pay | Admitting: Cardiovascular Disease

## 2017-04-22 ENCOUNTER — Ambulatory Visit (INDEPENDENT_AMBULATORY_CARE_PROVIDER_SITE_OTHER): Payer: PPO | Admitting: Cardiovascular Disease

## 2017-04-22 VITALS — BP 130/64 | HR 73 | Ht 64.0 in | Wt 200.1 lb

## 2017-04-22 DIAGNOSIS — I2510 Atherosclerotic heart disease of native coronary artery without angina pectoris: Secondary | ICD-10-CM | POA: Diagnosis not present

## 2017-04-22 DIAGNOSIS — R0600 Dyspnea, unspecified: Secondary | ICD-10-CM | POA: Diagnosis not present

## 2017-04-22 NOTE — Patient Instructions (Signed)
Your physician recommends that you continue on your current medications as directed. Please refer to the Current Medication list given to you today.   Your physician has requested that you have an echocardiogram. Echocardiography is a painless test that uses sound waves to create images of your heart. It provides your doctor with information about the size and shape of your heart and how well your heart's chambers and valves are working. This procedure takes approximately one hour. There are no restrictions for this procedure.  Your physician has requested that you have en exercise stress myoview. For further information please visit [email or web address]. Please follow instruction sheet, as given.   Your physician wants you to follow-up in:   [place] will receive a reminder letter in the mail two months in advance. If you don't receive a letter, please call our office to schedule the follow-up appointment.

## 2017-04-29 ENCOUNTER — Telehealth (HOSPITAL_COMMUNITY): Payer: Self-pay | Admitting: *Deleted

## 2017-04-29 NOTE — Telephone Encounter (Signed)
Patient given detailed instructions per Myocardial Perfusion Study Information Sheet for the test on 05/02/17 at 0945. Patient notified to arrive 15 minutes early and that it is imperative to arrive on time for appointment to keep from having the test rescheduled.  If you need to cancel or reschedule your appointment, please call the office within 24 hours of your appointment. . Patient verbalized understanding.Ysidro Ramsay, Ranae Palms

## 2017-05-02 ENCOUNTER — Ambulatory Visit (HOSPITAL_COMMUNITY): Payer: PPO | Attending: Cardiology

## 2017-05-02 ENCOUNTER — Other Ambulatory Visit: Payer: Self-pay

## 2017-05-02 ENCOUNTER — Ambulatory Visit (HOSPITAL_BASED_OUTPATIENT_CLINIC_OR_DEPARTMENT_OTHER): Payer: PPO

## 2017-05-02 DIAGNOSIS — R0600 Dyspnea, unspecified: Secondary | ICD-10-CM

## 2017-05-02 DIAGNOSIS — I2510 Atherosclerotic heart disease of native coronary artery without angina pectoris: Secondary | ICD-10-CM | POA: Diagnosis not present

## 2017-05-02 DIAGNOSIS — I5189 Other ill-defined heart diseases: Secondary | ICD-10-CM | POA: Diagnosis not present

## 2017-05-02 DIAGNOSIS — R9439 Abnormal result of other cardiovascular function study: Secondary | ICD-10-CM | POA: Insufficient documentation

## 2017-05-02 LAB — MYOCARDIAL PERFUSION IMAGING
CHL CUP NUCLEAR SRS: 6
CHL CUP RESTING HR STRESS: 68 {beats}/min
CHL RATE OF PERCEIVED EXERTION: 20
CSEPED: 5 min
CSEPEDS: 50 s
CSEPEW: 5.9 METS
LV dias vol: 77 mL (ref 46–106)
LV sys vol: 25 mL
MPHR: 145 {beats}/min
Peak HR: 137 {beats}/min
Percent HR: 94 %
RATE: 0.36
SDS: 5
SSS: 11
TID: 1.11

## 2017-05-02 MED ORDER — TECHNETIUM TC 99M TETROFOSMIN IV KIT
32.9000 | PACK | Freq: Once | INTRAVENOUS | Status: AC | PRN
  Administered 2017-05-02: 32.9 via INTRAVENOUS
  Filled 2017-05-02: qty 33

## 2017-05-02 MED ORDER — TECHNETIUM TC 99M TETROFOSMIN IV KIT
10.5000 | PACK | Freq: Once | INTRAVENOUS | Status: AC | PRN
  Administered 2017-05-02: 10.5 via INTRAVENOUS
  Filled 2017-05-02: qty 11

## 2017-05-06 ENCOUNTER — Telehealth: Payer: Self-pay

## 2017-05-06 MED ORDER — ISOSORBIDE MONONITRATE ER 30 MG PO TB24: 30.0000 mg | ORAL_TABLET | Freq: Every day | ORAL | 3 refills | Status: DC

## 2017-05-06 MED ORDER — CARVEDILOL 6.25 MG PO TABS: 6.2500 mg | ORAL_TABLET | Freq: Two times a day (BID) | ORAL | 3 refills | Status: DC

## 2017-05-06 NOTE — Telephone Encounter (Signed)
-----   Message from Josue Hector, MD sent at 05/04/2017  1:12 PM EDT ----- Small apical defect known incomplete revascularization increase coreg to 6.25 bid and add imdur 30 mg f/u with me 3 months

## 2017-05-06 NOTE — Telephone Encounter (Signed)
Patient aware of test results. Sent medications to patient's pharmacy of choice. Patient has an appointment on 08/01/17 with Dr. Johnsie Cancel to follow up.

## 2017-07-14 ENCOUNTER — Other Ambulatory Visit: Payer: Self-pay | Admitting: Cardiovascular Disease

## 2017-07-17 DIAGNOSIS — E039 Hypothyroidism, unspecified: Secondary | ICD-10-CM | POA: Diagnosis not present

## 2017-07-17 DIAGNOSIS — E7800 Pure hypercholesterolemia, unspecified: Secondary | ICD-10-CM | POA: Diagnosis not present

## 2017-07-17 DIAGNOSIS — E1165 Type 2 diabetes mellitus with hyperglycemia: Secondary | ICD-10-CM | POA: Diagnosis not present

## 2017-07-23 DIAGNOSIS — E1165 Type 2 diabetes mellitus with hyperglycemia: Secondary | ICD-10-CM | POA: Diagnosis not present

## 2017-07-23 DIAGNOSIS — I10 Essential (primary) hypertension: Secondary | ICD-10-CM | POA: Diagnosis not present

## 2017-07-23 DIAGNOSIS — E039 Hypothyroidism, unspecified: Secondary | ICD-10-CM | POA: Diagnosis not present

## 2017-07-23 DIAGNOSIS — E7800 Pure hypercholesterolemia, unspecified: Secondary | ICD-10-CM | POA: Diagnosis not present

## 2017-07-23 NOTE — Progress Notes (Signed)
Patient ID: Danielle Murray, female   DOB: July 05, 1941, 76 y.o.   MRN: [id number] Danielle Murray is seen  F/u  CAD with past PCI. Other issues include HLD, IDDM and HTN. She was cathed by Dr. Burt Knack back in October 2014 had 2 vessel disease with proximal/mid LAD and left dominant PD. Treated with stent to the LAD and medical therapy for her residual disease - PDA would be hard pressed to fit 2.0 stent  Twin sister past away in February 2015  and this is Very hard for her  Has some LE edema and exertional dyspnea  A1c too high Dr Soyla Murphy managing insulin   Myovue 04/12/17 reviewed EF 68% small area apical ischemia  Echo: EF 05-39% grade 2 diastolic PA 24 mmHg  Interval history: no angina Some back pain. Went over schedule of her meds to space them out Reviewed lab work from Dr Soyla Murphy and great including cholesterol and thyroid with normal LFT's Sugar still a bit high. Some exertional dyspea. Compliant with meds Has not taken any SL nitro   ROS: Denies fever, malais, weight loss, blurry vision, decreased visual acuity, cough, sputum, SOB, hemoptysis, pleuritic pain, palpitaitons, heartburn, abdominal pain, melena, lower extremity edema, claudication, or rash.  All other systems reviewed and negative  General: BP (!) 130/58   Pulse 72   Ht 5\' 4"  (1.626 m)   Wt 204 lb 6.4 oz (92.7 kg)   SpO2 96%   BMI 35.09 kg/m  Affect appropriate Healthy:  appears stated age HEENT: normal Neck supple with no adenopathy JVP normal no bruits no thyromegaly Lungs clear with no wheezing and good diaphragmatic motion Heart:  S1/S2 no murmur, no rub, gallop or click PMI normal Abdomen: benighn, BS positve, no tenderness, no AAA no bruit.  No HSM or HJR Distal pulses intact with no bruits No edema Neuro non-focal Skin warm and dry No muscular weakness     Current Outpatient Prescriptions  Medication Sig Dispense Refill  . amLODipine (NORVASC) 5 MG tablet TAKE 1 TABLET (5 MG TOTAL) BY MOUTH DAILY. 90  tablet 2  . atorvastatin (LIPITOR) 40 MG tablet Take 40 mg by mouth daily.    . carvedilol (COREG) 3.125 MG tablet TAKE 1 TABLET BY MOUTH TWICE A DAY 180 tablet 2  . Cholecalciferol (VITAMIN D3 PO) Take 1 tablet by mouth daily.    . clopidogrel (PLAVIX) 75 MG tablet TAKE 1 TABLET BY MOUTH EVERY DAY 90 tablet 2  . furosemide (LASIX) 40 MG tablet Take 40 mg by mouth daily as needed for fluid or edema.    Marland Kitchen glimepiride (AMARYL) 4 MG tablet Take 4 mg by mouth 2 (two) times daily.    . insulin NPH Human (HUMULIN N,NOVOLIN N) 100 UNIT/ML injection Inject 50 Units into the skin once.    . isosorbide mononitrate (IMDUR) 30 MG 24 hr tablet Take 1 tablet (30 mg total) by mouth daily. 90 tablet 3  . levothyroxine (SYNTHROID, LEVOTHROID) 88 MCG tablet Take 88 mcg by mouth daily before breakfast.    . losartan-hydrochlorothiazide (HYZAAR) 100-25 MG per tablet Take 1 tablet by mouth daily.    . metFORMIN (GLUCOPHAGE) 500 MG tablet Take 1 tablet (500 mg total) by mouth daily.    . MULTIPLE VITAMIN PO Take 1 tablet by mouth daily.    . nitroGLYCERIN (NITROSTAT) 0.4 MG SL tablet Place 1 tablet (0.4 mg total) under the tongue every 5 (five) minutes as needed for chest pain (3 doses max). 25 tablet 3  .  Probiotic Product (PROBIOTIC PO) Take 1 tablet by mouth daily.     No current facility-administered medications for this visit.     Allergies  Penicillins  Electrocardiogram:  12/14  SR rate 67 low voltage PAC  10/28/14 SR rate 73  Low voltage normal no change  10/13/15 SR rate 68 low voltage 04/22/17  SR rate 72 Low voltage poor R wave   Assessment and Plan CAD: low risk myovue 05/02/17 small area of ischemia nitrates added continue medical Rx  DM: Discussed low carb diet.  Target hemoglobin A1c is 6.5 or less.  Continue current medications. Sees Balin  Insulin adjusted  HTN: Well controlled.  Continue current medications and low sodium Dash type diet.   Thyroid  Continue replacement sees Balin for labs   Edema:  Continue Hyzaar and PRN lasix  elevation / support hose  Cholesterol:  Continue lipitor labs with primar y  F/u 6 months     Jenkins Rouge

## 2017-08-01 ENCOUNTER — Encounter: Payer: Self-pay | Admitting: Cardiovascular Disease

## 2017-08-01 ENCOUNTER — Ambulatory Visit (INDEPENDENT_AMBULATORY_CARE_PROVIDER_SITE_OTHER): Payer: PPO | Admitting: Cardiovascular Disease

## 2017-08-01 VITALS — BP 130/58 | HR 72 | Ht 64.0 in | Wt 204.4 lb

## 2017-08-01 DIAGNOSIS — I2510 Atherosclerotic heart disease of native coronary artery without angina pectoris: Secondary | ICD-10-CM | POA: Diagnosis not present

## 2017-08-01 DIAGNOSIS — I10 Essential (primary) hypertension: Secondary | ICD-10-CM | POA: Diagnosis not present

## 2017-08-01 NOTE — Patient Instructions (Addendum)

## 2017-08-16 ENCOUNTER — Other Ambulatory Visit: Payer: Self-pay | Admitting: Internal Medicine

## 2017-08-16 DIAGNOSIS — N63 Unspecified lump in unspecified breast: Secondary | ICD-10-CM

## 2017-08-21 ENCOUNTER — Ambulatory Visit
Admission: RE | Admit: 2017-08-21 | Discharge: 2017-08-21 | Disposition: A | Discharge status: Home / Self Care | Payer: PPO | Source: Ambulatory Visit | Attending: Internal Medicine | Admitting: Internal Medicine

## 2017-08-21 ENCOUNTER — Other Ambulatory Visit: Payer: Self-pay | Admitting: Internal Medicine

## 2017-08-21 DIAGNOSIS — N63 Unspecified lump in unspecified breast: Secondary | ICD-10-CM

## 2017-08-21 DIAGNOSIS — N6489 Other specified disorders of breast: Secondary | ICD-10-CM | POA: Diagnosis not present

## 2017-08-21 DIAGNOSIS — R928 Other abnormal and inconclusive findings on diagnostic imaging of breast: Secondary | ICD-10-CM | POA: Diagnosis not present

## 2017-11-28 DIAGNOSIS — I10 Essential (primary) hypertension: Secondary | ICD-10-CM | POA: Diagnosis not present

## 2017-11-28 DIAGNOSIS — E7800 Pure hypercholesterolemia, unspecified: Secondary | ICD-10-CM | POA: Diagnosis not present

## 2017-11-28 DIAGNOSIS — E039 Hypothyroidism, unspecified: Secondary | ICD-10-CM | POA: Diagnosis not present

## 2017-11-28 DIAGNOSIS — R609 Edema, unspecified: Secondary | ICD-10-CM | POA: Diagnosis not present

## 2017-11-28 DIAGNOSIS — E1165 Type 2 diabetes mellitus with hyperglycemia: Secondary | ICD-10-CM | POA: Diagnosis not present

## 2018-01-02 DIAGNOSIS — E1165 Type 2 diabetes mellitus with hyperglycemia: Secondary | ICD-10-CM | POA: Diagnosis not present

## 2018-01-02 DIAGNOSIS — E7800 Pure hypercholesterolemia, unspecified: Secondary | ICD-10-CM | POA: Diagnosis not present

## 2018-01-02 DIAGNOSIS — I10 Essential (primary) hypertension: Secondary | ICD-10-CM | POA: Diagnosis not present

## 2018-01-07 DIAGNOSIS — I10 Essential (primary) hypertension: Secondary | ICD-10-CM | POA: Diagnosis not present

## 2018-01-07 DIAGNOSIS — Z6833 Body mass index (BMI) 33.0-33.9, adult: Secondary | ICD-10-CM | POA: Diagnosis not present

## 2018-01-07 DIAGNOSIS — M19041 Primary osteoarthritis, right hand: Secondary | ICD-10-CM | POA: Diagnosis not present

## 2018-01-07 DIAGNOSIS — L309 Dermatitis, unspecified: Secondary | ICD-10-CM | POA: Diagnosis not present

## 2018-01-07 DIAGNOSIS — E7800 Pure hypercholesterolemia, unspecified: Secondary | ICD-10-CM | POA: Diagnosis not present

## 2018-01-07 DIAGNOSIS — E039 Hypothyroidism, unspecified: Secondary | ICD-10-CM | POA: Diagnosis not present

## 2018-01-07 DIAGNOSIS — G2581 Restless legs syndrome: Secondary | ICD-10-CM | POA: Diagnosis not present

## 2018-01-07 DIAGNOSIS — I2510 Atherosclerotic heart disease of native coronary artery without angina pectoris: Secondary | ICD-10-CM | POA: Diagnosis not present

## 2018-01-07 DIAGNOSIS — E1121 Type 2 diabetes mellitus with diabetic nephropathy: Secondary | ICD-10-CM | POA: Diagnosis not present

## 2018-01-07 DIAGNOSIS — J069 Acute upper respiratory infection, unspecified: Secondary | ICD-10-CM | POA: Diagnosis not present

## 2018-01-07 DIAGNOSIS — Z0001 Encounter for general adult medical examination with abnormal findings: Secondary | ICD-10-CM | POA: Diagnosis not present

## 2018-01-07 DIAGNOSIS — M160 Bilateral primary osteoarthritis of hip: Secondary | ICD-10-CM | POA: Diagnosis not present

## 2018-01-27 DIAGNOSIS — Z961 Presence of intraocular lens: Secondary | ICD-10-CM | POA: Diagnosis not present

## 2018-01-27 DIAGNOSIS — E113293 Type 2 diabetes mellitus with mild nonproliferative diabetic retinopathy without macular edema, bilateral: Secondary | ICD-10-CM | POA: Diagnosis not present

## 2018-01-27 DIAGNOSIS — H43812 Vitreous degeneration, left eye: Secondary | ICD-10-CM | POA: Diagnosis not present

## 2018-01-27 DIAGNOSIS — H35031 Hypertensive retinopathy, right eye: Secondary | ICD-10-CM | POA: Diagnosis not present

## 2018-02-21 ENCOUNTER — Ambulatory Visit
Admission: RE | Admit: 2018-02-21 | Discharge: 2018-02-21 | Disposition: A | Discharge status: Home / Self Care | Payer: PPO | Source: Ambulatory Visit | Attending: Internal Medicine | Admitting: Internal Medicine

## 2018-02-21 DIAGNOSIS — N63 Unspecified lump in unspecified breast: Secondary | ICD-10-CM

## 2018-02-21 DIAGNOSIS — N6002 Solitary cyst of left breast: Secondary | ICD-10-CM | POA: Diagnosis not present

## 2018-02-21 DIAGNOSIS — R921 Mammographic calcification found on diagnostic imaging of breast: Secondary | ICD-10-CM | POA: Diagnosis not present

## 2018-03-24 DIAGNOSIS — E7800 Pure hypercholesterolemia, unspecified: Secondary | ICD-10-CM | POA: Diagnosis not present

## 2018-03-24 DIAGNOSIS — E039 Hypothyroidism, unspecified: Secondary | ICD-10-CM | POA: Diagnosis not present

## 2018-03-24 DIAGNOSIS — E1121 Type 2 diabetes mellitus with diabetic nephropathy: Secondary | ICD-10-CM | POA: Diagnosis not present

## 2018-03-31 DIAGNOSIS — E1121 Type 2 diabetes mellitus with diabetic nephropathy: Secondary | ICD-10-CM | POA: Diagnosis not present

## 2018-03-31 DIAGNOSIS — E7800 Pure hypercholesterolemia, unspecified: Secondary | ICD-10-CM | POA: Diagnosis not present

## 2018-03-31 DIAGNOSIS — I10 Essential (primary) hypertension: Secondary | ICD-10-CM | POA: Diagnosis not present

## 2018-03-31 DIAGNOSIS — L309 Dermatitis, unspecified: Secondary | ICD-10-CM | POA: Diagnosis not present

## 2018-03-31 DIAGNOSIS — E039 Hypothyroidism, unspecified: Secondary | ICD-10-CM | POA: Diagnosis not present

## 2018-05-01 ENCOUNTER — Other Ambulatory Visit: Payer: Self-pay | Admitting: Cardiovascular Disease

## 2018-05-11 ENCOUNTER — Other Ambulatory Visit: Payer: Self-pay | Admitting: Cardiovascular Disease

## 2018-07-18 ENCOUNTER — Encounter: Payer: Self-pay | Admitting: Cardiovascular Disease

## 2018-07-28 NOTE — Progress Notes (Signed)
Patient ID: Danielle Murray, female   DOB: Nov 23, 1940, 77 y.o.   MRN: [id number]   77 y.o. f/u CAD, HTN, HLD, IDDM.  08/2013 stent to LAD and medical Rx for small left sided PDA. Low risk myovue in June 2018 She is a twin and her sister died in 7.  Sees Balin for her DM. No angina. Some LE edema dependant  Myovue 04/12/17 reviewed EF 68% small area apical ischemia  Echo: EF 78-29% grade 2 diastolic PA 24 mmHg  Lots of complaints with exertional dyspnea, poor balance and bladder irritability  Thinks lipitor is giving her cramps stopped it for a week and better    ROS: Denies fever, malais, weight loss, blurry vision, decreased visual acuity, cough, sputum, SOB, hemoptysis, pleuritic pain, palpitaitons, heartburn, abdominal pain, melena, lower extremity edema, claudication, or rash.  All other systems reviewed and negative  General: BP 122/68   Pulse 63   Ht 5\' 4"  (1.626 m)   Wt 202 lb 4 oz (91.7 kg)   SpO2 97%   BMI 34.72 kg/m  Affect appropriate Healthy:  appears stated age 77: normal Neck supple with no adenopathy JVP normal no bruits no thyromegaly Lungs clear with no wheezing and good diaphragmatic motion Heart:  S1/S2 no murmur, no rub, gallop or click PMI normal Abdomen: benighn, BS positve, no tenderness, no AAA no bruit.  No HSM or HJR Distal pulses intact with no bruits Trace bilateral edema Neuro non-focal Skin warm and dry No muscular weakness      Current Outpatient Medications  Medication Sig Dispense Refill  . amLODipine (NORVASC) 5 MG tablet TAKE 1 TABLET BY MOUTH EVERY DAY 90 tablet 3  . atorvastatin (LIPITOR) 40 MG tablet Take 40 mg by mouth daily.    . carvedilol (COREG) 6.25 MG tablet Take 1 tablet (6.25 mg total) by mouth 2 (two) times daily with a meal. Please keep upcoming appt for future refills. Thank you. 180 tablet 0  . Cholecalciferol (VITAMIN D3 PO) Take 1 tablet by mouth daily.    . clopidogrel (PLAVIX) 75 MG tablet TAKE 1 TABLET BY MOUTH  EVERY DAY 90 tablet 3  . furosemide (LASIX) 40 MG tablet Take 40 mg by mouth daily as needed for fluid or edema.    Marland Kitchen glimepiride (AMARYL) 4 MG tablet Take 4 mg by mouth 2 (two) times daily.    . insulin NPH Human (HUMULIN N,NOVOLIN N) 100 UNIT/ML injection Inject 50 Units into the skin once.    . isosorbide mononitrate (IMDUR) 30 MG 24 hr tablet Take 1 tablet (30 mg total) by mouth daily. Please keep upcoming appt for future refills. Thank you. 90 tablet 0  . levothyroxine (SYNTHROID, LEVOTHROID) 88 MCG tablet Take 88 mcg by mouth daily before breakfast.    . losartan-hydrochlorothiazide (HYZAAR) 100-25 MG per tablet Take 1 tablet by mouth daily.    . metFORMIN (GLUCOPHAGE) 500 MG tablet Take 1 tablet (500 mg total) by mouth daily.    . nitroGLYCERIN (NITROSTAT) 0.4 MG SL tablet Place 1 tablet (0.4 mg total) under the tongue every 5 (five) minutes as needed for chest pain (3 doses max). 25 tablet 3  . Probiotic Product (PROBIOTIC PO) Take 1 tablet by mouth daily.     No current facility-administered medications for this visit.     Allergies  Penicillins  Electrocardiogram:  08/04/18  SR rate 55 low voltage no changes   Assessment and Plan CAD: low risk myovue 05/02/17 small area of ischemia nitrates  added continue medical Rx  DM: Discussed low carb diet.  Target hemoglobin A1c is 6.5 or less.  Continue current medications. Sees Balin  Insulin adjusted  HTN: Well controlled.  Continue current medications and low sodium Dash type diet.   Thyroid  Continue replacement sees Balin for labs  Edema:  Continue Hyzaar and PRN lasix  elevation / support hose  Cholesterol:  Change to Crestor 5 mg see if this helps cramps Dyspnea:  She declines maintenance rehab. Check BMET/BNP  Suspect functional   F/u 6 months     Jenkins Rouge

## 2018-07-31 DIAGNOSIS — R350 Frequency of micturition: Secondary | ICD-10-CM | POA: Diagnosis not present

## 2018-07-31 DIAGNOSIS — I10 Essential (primary) hypertension: Secondary | ICD-10-CM | POA: Diagnosis not present

## 2018-07-31 DIAGNOSIS — N390 Urinary tract infection, site not specified: Secondary | ICD-10-CM | POA: Diagnosis not present

## 2018-07-31 DIAGNOSIS — E1121 Type 2 diabetes mellitus with diabetic nephropathy: Secondary | ICD-10-CM | POA: Diagnosis not present

## 2018-08-04 ENCOUNTER — Encounter: Payer: Self-pay | Admitting: Cardiovascular Disease

## 2018-08-04 ENCOUNTER — Ambulatory Visit: Payer: PPO | Admitting: Cardiovascular Disease

## 2018-08-04 VITALS — BP 122/68 | HR 63 | Ht 64.0 in | Wt 202.2 lb

## 2018-08-04 DIAGNOSIS — Z79899 Other long term (current) drug therapy: Secondary | ICD-10-CM | POA: Diagnosis not present

## 2018-08-04 DIAGNOSIS — R0602 Shortness of breath: Secondary | ICD-10-CM | POA: Diagnosis not present

## 2018-08-04 DIAGNOSIS — I2510 Atherosclerotic heart disease of native coronary artery without angina pectoris: Secondary | ICD-10-CM | POA: Diagnosis not present

## 2018-08-04 DIAGNOSIS — I10 Essential (primary) hypertension: Secondary | ICD-10-CM

## 2018-08-04 LAB — BASIC METABOLIC PANEL
BUN/Creatinine Ratio: 19 (ref 12–28)
BUN: 23 mg/dL (ref 8–27)
CHLORIDE: 94 mmol/L — AB (ref 96–106)
CO2: 22 mmol/L (ref 20–29)
Calcium: 9.8 mg/dL (ref 8.7–10.3)
Creatinine, Ser: 1.23 mg/dL — ABNORMAL HIGH (ref 0.57–1.00)
GFR calc Af Amer: 49 mL/min/{1.73_m2} — ABNORMAL LOW (ref >59)
GFR calc non Af Amer: 43 mL/min/{1.73_m2} — ABNORMAL LOW (ref >59)
GLUCOSE: 167 mg/dL — AB (ref 65–99)
Potassium: 4.9 mmol/L (ref 3.5–5.2)
Sodium: 136 mmol/L (ref 134–144)

## 2018-08-04 LAB — PRO B NATRIURETIC PEPTIDE: NT-Pro BNP: 603 pg/mL (ref 0–738)

## 2018-08-04 MED ORDER — ROSUVASTATIN CALCIUM 5 MG PO TABS: 5.0000 mg | ORAL_TABLET | Freq: Every day | ORAL | 3 refills | Status: DC

## 2018-08-04 MED ORDER — NITROGLYCERIN 0.4 MG SL SUBL: 0.4000 mg | SUBLINGUAL_TABLET | SUBLINGUAL | 3 refills | Status: AC | PRN

## 2018-08-04 NOTE — Patient Instructions (Addendum)
Medication Instructions:  Your physician has recommended you make the following change in your medication:  1-STOP lipitor 2-START crestor 5 mg by mouth daily  Labwork: Your physician recommends that you have lab work today- BMET and BNP.  Your physician recommends that you return for lab work in: 3 months for fasting lipid and liver panel.   Testing/Procedures: NONE  Follow-Up: Your physician wants you to follow-up in: 6 months with Dr. Johnsie Cancel. You will receive a reminder letter in the mail two months in advance. If you don't receive a letter, please call our office to schedule the follow-up appointment.   If you need a refill on your cardiac medications before your next appointment, please call your pharmacy.

## 2018-08-06 NOTE — Addendum Note (Signed)
Addended by: Joaquim Lai on: 08/06/2018 08:12 AM   Modules accepted: Orders

## 2018-08-07 DIAGNOSIS — E1165 Type 2 diabetes mellitus with hyperglycemia: Secondary | ICD-10-CM | POA: Diagnosis not present

## 2018-08-07 DIAGNOSIS — I10 Essential (primary) hypertension: Secondary | ICD-10-CM | POA: Diagnosis not present

## 2018-08-07 DIAGNOSIS — E7800 Pure hypercholesterolemia, unspecified: Secondary | ICD-10-CM | POA: Diagnosis not present

## 2018-08-07 DIAGNOSIS — E039 Hypothyroidism, unspecified: Secondary | ICD-10-CM | POA: Diagnosis not present

## 2018-08-07 DIAGNOSIS — E1121 Type 2 diabetes mellitus with diabetic nephropathy: Secondary | ICD-10-CM | POA: Diagnosis not present

## 2018-08-12 DIAGNOSIS — I10 Essential (primary) hypertension: Secondary | ICD-10-CM | POA: Diagnosis not present

## 2018-08-18 ENCOUNTER — Other Ambulatory Visit: Payer: Self-pay | Admitting: Cardiovascular Disease

## 2018-09-04 DIAGNOSIS — D225 Melanocytic nevi of trunk: Secondary | ICD-10-CM | POA: Diagnosis not present

## 2018-09-04 DIAGNOSIS — D485 Neoplasm of uncertain behavior of skin: Secondary | ICD-10-CM | POA: Diagnosis not present

## 2018-09-04 DIAGNOSIS — L570 Actinic keratosis: Secondary | ICD-10-CM | POA: Diagnosis not present

## 2018-09-04 DIAGNOSIS — L821 Other seborrheic keratosis: Secondary | ICD-10-CM | POA: Diagnosis not present

## 2018-09-04 DIAGNOSIS — L308 Other specified dermatitis: Secondary | ICD-10-CM | POA: Diagnosis not present

## 2018-09-22 DIAGNOSIS — L570 Actinic keratosis: Secondary | ICD-10-CM | POA: Diagnosis not present

## 2018-10-31 ENCOUNTER — Other Ambulatory Visit: Payer: PPO

## 2018-10-31 DIAGNOSIS — I10 Essential (primary) hypertension: Secondary | ICD-10-CM | POA: Diagnosis not present

## 2018-10-31 DIAGNOSIS — Z79899 Other long term (current) drug therapy: Secondary | ICD-10-CM

## 2018-10-31 DIAGNOSIS — I2510 Atherosclerotic heart disease of native coronary artery without angina pectoris: Secondary | ICD-10-CM

## 2018-10-31 LAB — HEPATIC FUNCTION PANEL
ALBUMIN: 3.8 g/dL (ref 3.5–4.8)
ALT: 8 IU/L (ref 0–32)
AST: 10 IU/L (ref 0–40)
Alkaline Phosphatase: 75 IU/L (ref 39–117)
BILIRUBIN TOTAL: 0.6 mg/dL (ref 0.0–1.2)
Bilirubin, Direct: 0.14 mg/dL (ref 0.00–0.40)
Total Protein: 6.5 g/dL (ref 6.0–8.5)

## 2018-10-31 LAB — LIPID PANEL
CHOL/HDL RATIO: 3.6 ratio (ref 0.0–4.4)
Cholesterol, Total: 130 mg/dL (ref 100–199)
HDL: 36 mg/dL — ABNORMAL LOW (ref >39)
LDL CALC: 59 mg/dL (ref 0–99)
Triglycerides: 174 mg/dL — ABNORMAL HIGH (ref 0–149)
VLDL Cholesterol Cal: 35 mg/dL (ref 5–40)

## 2018-12-08 DIAGNOSIS — E1121 Type 2 diabetes mellitus with diabetic nephropathy: Secondary | ICD-10-CM | POA: Diagnosis not present

## 2018-12-08 DIAGNOSIS — M792 Neuralgia and neuritis, unspecified: Secondary | ICD-10-CM | POA: Diagnosis not present

## 2018-12-08 DIAGNOSIS — I10 Essential (primary) hypertension: Secondary | ICD-10-CM | POA: Diagnosis not present

## 2018-12-08 DIAGNOSIS — E1165 Type 2 diabetes mellitus with hyperglycemia: Secondary | ICD-10-CM | POA: Diagnosis not present

## 2018-12-08 DIAGNOSIS — E039 Hypothyroidism, unspecified: Secondary | ICD-10-CM | POA: Diagnosis not present

## 2018-12-08 DIAGNOSIS — W19XXXA Unspecified fall, initial encounter: Secondary | ICD-10-CM | POA: Diagnosis not present

## 2018-12-08 DIAGNOSIS — E7800 Pure hypercholesterolemia, unspecified: Secondary | ICD-10-CM | POA: Diagnosis not present

## 2019-01-07 DIAGNOSIS — Z13820 Encounter for screening for osteoporosis: Secondary | ICD-10-CM | POA: Diagnosis not present

## 2019-01-07 DIAGNOSIS — E7800 Pure hypercholesterolemia, unspecified: Secondary | ICD-10-CM | POA: Diagnosis not present

## 2019-01-07 DIAGNOSIS — I10 Essential (primary) hypertension: Secondary | ICD-10-CM | POA: Diagnosis not present

## 2019-01-07 DIAGNOSIS — E1165 Type 2 diabetes mellitus with hyperglycemia: Secondary | ICD-10-CM | POA: Diagnosis not present

## 2019-01-12 DIAGNOSIS — G2581 Restless legs syndrome: Secondary | ICD-10-CM | POA: Diagnosis not present

## 2019-01-12 DIAGNOSIS — R0989 Other specified symptoms and signs involving the circulatory and respiratory systems: Secondary | ICD-10-CM | POA: Diagnosis not present

## 2019-01-12 DIAGNOSIS — E7800 Pure hypercholesterolemia, unspecified: Secondary | ICD-10-CM | POA: Diagnosis not present

## 2019-01-12 DIAGNOSIS — E039 Hypothyroidism, unspecified: Secondary | ICD-10-CM | POA: Diagnosis not present

## 2019-01-12 DIAGNOSIS — I10 Essential (primary) hypertension: Secondary | ICD-10-CM | POA: Diagnosis not present

## 2019-01-12 DIAGNOSIS — M19041 Primary osteoarthritis, right hand: Secondary | ICD-10-CM | POA: Diagnosis not present

## 2019-01-12 DIAGNOSIS — Z0000 Encounter for general adult medical examination without abnormal findings: Secondary | ICD-10-CM | POA: Diagnosis not present

## 2019-01-12 DIAGNOSIS — Z23 Encounter for immunization: Secondary | ICD-10-CM | POA: Diagnosis not present

## 2019-01-12 DIAGNOSIS — E1121 Type 2 diabetes mellitus with diabetic nephropathy: Secondary | ICD-10-CM | POA: Diagnosis not present

## 2019-01-12 DIAGNOSIS — I2510 Atherosclerotic heart disease of native coronary artery without angina pectoris: Secondary | ICD-10-CM | POA: Diagnosis not present

## 2019-01-15 DIAGNOSIS — I70202 Unspecified atherosclerosis of native arteries of extremities, left leg: Secondary | ICD-10-CM | POA: Diagnosis not present

## 2019-01-15 DIAGNOSIS — R0989 Other specified symptoms and signs involving the circulatory and respiratory systems: Secondary | ICD-10-CM | POA: Diagnosis not present

## 2019-02-03 ENCOUNTER — Telehealth: Payer: Self-pay | Admitting: *Deleted

## 2019-02-03 NOTE — Telephone Encounter (Signed)
   Primary Cardiologist: No primary care provider on file.   Pt contacted.  History and symptoms reviewed.  Pt will f/u with [facility] provider as scheduled.  Pt. advised that we are restricting visitors at this time and request that only patients present for check-in prior to their appointment.  All other visitors should remain in their car.  If necessary, only one visitor may come with the patient, into the building. For everyone's safety, all patients and visitor entering our practice area should expect to be screened again prior to entering our waiting area.  Fredia Beets, RN  02/03/2019 3:30 PM

## 2019-02-04 ENCOUNTER — Ambulatory Visit: Payer: PPO | Admitting: Cardiovascular Disease

## 2019-02-04 ENCOUNTER — Encounter: Payer: Self-pay | Admitting: Cardiovascular Disease

## 2019-02-04 ENCOUNTER — Other Ambulatory Visit: Payer: Self-pay

## 2019-02-04 DIAGNOSIS — I739 Peripheral vascular disease, unspecified: Secondary | ICD-10-CM | POA: Diagnosis not present

## 2019-02-04 NOTE — Patient Instructions (Signed)
Medication Instructions:  NO CHANGE If you need a refill on your cardiac medications before your next appointment, please call your pharmacy.   Lab work: If you have labs (blood work) drawn today and your tests are completely normal, you will receive your results only by: Marland Kitchen MyChart Message (if you have MyChart) OR . A paper copy in the mail If you have any lab test that is abnormal or we need to change your treatment, we will call you to review the results.  Follow-Up: At [facility], you and your health needs are our priority.  As part of our continuing mission to provide you with exceptional heart care, we have created designated Provider Care Teams.  These Care Teams include your primary Cardiologist (physician) and Advanced Practice Providers (APPs -  Physician Assistants and Nurse Practitioners) who all work together to provide you with the care you need, when you need it. Your physician recommends that you schedule a follow-up appointment in: AS NEEDED WITH DR [name]

## 2019-02-04 NOTE — Assessment & Plan Note (Signed)
Danielle Murray was referred to me by Dr. Shelia Media for peripheral vascular evaluation.  She does have a history of CAD status post remote stenting as well as treated hypertension, diabetes and hyperlipidemia.  She is had a left total knee replacement.  She does have restless leg syndrome which she is intermittently symptomatic from.  Apparently Dr. Concha Pyo was unable to feel left pedal pulses and because of some vague left lower extremity symptoms lower extremity arterial Doppler studies were performed on 01/15/2019 revealing a right ABI of 0.88 and a left ABI of 0.8.  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  She has no evidence of critical limb ischemia and on further questioning it does not sound like she has lifestyle limiting claudication.  I recommend conservative therapy at this time.  There is no indication for a more aggressive approach.

## 2019-02-04 NOTE — Progress Notes (Signed)
02/04/2019 Danielle Murray   03-May-1941  [id number]  Primary Physician Danielle Pretty, MD Primary Cardiologist: Danielle Harp MD Danielle Murray, [place]  HPI:  Danielle Murray is a 78 y.o. moderately overweight married Caucasian female mother of 2 sons, with no grandchildren who was referred by Dr. Shelia Murray for peripheral vascular valuation because of abnormal Doppler studies and poorly palpable left pedal pulses.  Her cardiologist is Dr. Johnsie Murray.  She is retired from being the Counsellor for [employer].  She has a history of CAD status post LAD stenting by Dr. Burt Murray 10/14.  She also has treated hypertension, diabetes and hyperlipidemia as well as family history with a father who died of myocardial infarction and brother who has stents.  She has had a left total knee replacement.  She had Dopplers performed in Dr. Pennie Murray office because of a poorly palpable left pedal pulses that revealed a right ABI of 0.88 and a left ABI of 0.8.  She apparently had an occluded left posterior tibial artery.  She has no evidence of critical limb ischemia and on further questioning it does not appear that she has significant lifestyle of any claudication.  She does have restless leg syndrome however.   Current Meds  Medication Sig  . amLODipine (NORVASC) 5 MG tablet TAKE 1 TABLET BY MOUTH EVERY DAY  . carvedilol (COREG) 6.25 MG tablet TAKE 1 TABLET BY MOUTH TWICE DAILY WITH MEALS  . Cholecalciferol (VITAMIN D3 PO) Take 1 tablet by mouth daily.  . clopidogrel (PLAVIX) 75 MG tablet TAKE 1 TABLET BY MOUTH EVERY DAY  . furosemide (LASIX) 40 MG tablet Take 40 mg by mouth daily as needed for fluid or edema.  . insulin NPH Human (HUMULIN N,NOVOLIN N) 100 UNIT/ML injection Inject 50 Units into the skin once. TAKE 15 UNITS IN THE MORNING AND 50 UNITS AT NIGHT.  Marland Kitchen isosorbide mononitrate (IMDUR) 30 MG 24 hr tablet Take 1 tablet (30 mg total) by mouth daily.  Marland Kitchen levothyroxine (SYNTHROID, LEVOTHROID) 88 MCG tablet  Take 88 mcg by mouth at bedtime.   . metFORMIN (GLUCOPHAGE) 500 MG tablet Take 1 tablet (500 mg total) by mouth daily.  . nitroGLYCERIN (NITROSTAT) 0.4 MG SL tablet Place 1 tablet (0.4 mg total) under the tongue every 5 (five) minutes as needed for chest pain (3 doses max).  . Probiotic Product (PROBIOTIC PO) Take 1 tablet by mouth as needed.   . rosuvastatin (CRESTOR) 5 MG tablet Take 1 tablet (5 mg total) by mouth daily.     Allergies  Allergen Reactions  . Penicillins     Diffuse joint pain @ age 71 in context of Scarlet Fever    Social History   Socioeconomic History  . Marital status: Married    Spouse name: Not on file  . Number of children: Not on file  . Years of education: Not on file  . Highest education level: Not on file  Occupational History  . Not on file  Social Needs  . Financial resource strain: Not on file  . Food insecurity:    Worry: Not on file    Inability: Not on file  . Transportation needs:    Medical: Not on file    Non-medical: Not on file  Tobacco Use  . Smoking status: Never Smoker  . Smokeless tobacco: Never Used  Substance and Sexual Activity  . Alcohol use: Yes    Comment: 09/03/2013 "1-2 times/yr"  . Drug use: No  .  Sexual activity: Yes  Lifestyle  . Physical activity:    Days per week: Not on file    Minutes per session: Not on file  . Stress: Not on file  Relationships  . Social connections:    Talks on phone: Not on file    Gets together: Not on file    Attends religious service: Not on file    Active member of club or organization: Not on file    Attends meetings of clubs or organizations: Not on file    Relationship status: Not on file  . Intimate partner violence:    Fear of current or ex partner: Not on file    Emotionally abused: Not on file    Physically abused: Not on file    Forced sexual activity: Not on file  Other Topics Concern  . Not on file  Social History Narrative  . Not on file     Review of Systems:  General: negative for chills, fever, night sweats or weight changes.  Cardiovascular: negative for chest pain, dyspnea on exertion, edema, orthopnea, palpitations, paroxysmal nocturnal dyspnea or shortness of breath Dermatological: negative for rash Respiratory: negative for cough or wheezing Urologic: negative for hematuria Abdominal: negative for nausea, vomiting, diarrhea, bright red blood per rectum, melena, or hematemesis Neurologic: negative for visual changes, syncope, or dizziness All other systems reviewed and are otherwise negative except as noted above.    Blood pressure 124/62, pulse 65, height 5\' 4"  (1.626 m), weight 202 lb (91.6 kg).  General appearance: alert and no distress Neck: no adenopathy, no carotid bruit, no JVD, supple, symmetrical, trachea midline and thyroid not enlarged, symmetric, no tenderness/mass/nodules Lungs: clear to auscultation bilaterally Heart: regular rate and rhythm, S1, S2 normal, no murmur, click, rub or gallop Extremities: extremities normal, atraumatic, no cyanosis or edema Pulses: 2+ left dorsalis pedis pulse Skin: Skin color, texture, turgor normal. No rashes or lesions Neurologic: Alert and oriented X 3, normal strength and tone. Normal symmetric reflexes. Normal coordination and gait  EKG not performed today  ASSESSMENT AND PLAN:   Peripheral arterial disease ([place]) Ms. Danielle Murray was referred to me by Dr. Shelia Murray for peripheral vascular evaluation.  She does have a history of CAD status post remote stenting as well as treated hypertension, diabetes and hyperlipidemia.  She is had a left total knee replacement.  She does have restless leg syndrome which she is intermittently symptomatic from.  Apparently Dr. Concha Murray was unable to feel left pedal pulses and because of some vague left lower extremity symptoms lower extremity arterial Doppler studies were performed on 01/15/2019 revealing a right ABI of 0.88 and a left ABI of 0.8.  She did have what  appears to be an occluded left posterior tibial artery although on exam her left dorsalis pedis is 2+.  She has no evidence of critical limb ischemia and on further questioning it does not sound like she has lifestyle limiting claudication.  I recommend conservative therapy at this time.  There is no indication for a more aggressive approach.      Danielle Harp MD FACP,FACC,FAHA, [facility] 02/04/2019 9:18 AM

## 2019-03-23 DIAGNOSIS — H35031 Hypertensive retinopathy, right eye: Secondary | ICD-10-CM | POA: Diagnosis not present

## 2019-03-23 DIAGNOSIS — E113293 Type 2 diabetes mellitus with mild nonproliferative diabetic retinopathy without macular edema, bilateral: Secondary | ICD-10-CM | POA: Diagnosis not present

## 2019-03-23 DIAGNOSIS — H43812 Vitreous degeneration, left eye: Secondary | ICD-10-CM | POA: Diagnosis not present

## 2019-03-23 DIAGNOSIS — H43811 Vitreous degeneration, right eye: Secondary | ICD-10-CM | POA: Diagnosis not present

## 2019-05-18 ENCOUNTER — Other Ambulatory Visit: Payer: Self-pay | Admitting: Cardiovascular Disease

## 2019-06-08 DIAGNOSIS — I10 Essential (primary) hypertension: Secondary | ICD-10-CM | POA: Diagnosis not present

## 2019-06-08 DIAGNOSIS — E7800 Pure hypercholesterolemia, unspecified: Secondary | ICD-10-CM | POA: Diagnosis not present

## 2019-06-08 DIAGNOSIS — E1121 Type 2 diabetes mellitus with diabetic nephropathy: Secondary | ICD-10-CM | POA: Diagnosis not present

## 2019-06-08 DIAGNOSIS — E039 Hypothyroidism, unspecified: Secondary | ICD-10-CM | POA: Diagnosis not present

## 2019-06-08 DIAGNOSIS — E1165 Type 2 diabetes mellitus with hyperglycemia: Secondary | ICD-10-CM | POA: Diagnosis not present

## 2019-06-18 ENCOUNTER — Other Ambulatory Visit: Payer: Self-pay | Admitting: Cardiovascular Disease

## 2019-07-03 DIAGNOSIS — Z1212 Encounter for screening for malignant neoplasm of rectum: Secondary | ICD-10-CM | POA: Diagnosis not present

## 2019-07-03 DIAGNOSIS — Z1211 Encounter for screening for malignant neoplasm of colon: Secondary | ICD-10-CM | POA: Diagnosis not present

## 2019-08-10 ENCOUNTER — Other Ambulatory Visit: Payer: Self-pay | Admitting: Cardiovascular Disease

## 2019-09-09 ENCOUNTER — Other Ambulatory Visit: Payer: Self-pay | Admitting: Cardiovascular Disease

## 2019-09-14 DIAGNOSIS — D0462 Carcinoma in situ of skin of left upper limb, including shoulder: Secondary | ICD-10-CM | POA: Diagnosis not present

## 2019-09-14 DIAGNOSIS — L821 Other seborrheic keratosis: Secondary | ICD-10-CM | POA: Diagnosis not present

## 2019-09-14 DIAGNOSIS — D225 Melanocytic nevi of trunk: Secondary | ICD-10-CM | POA: Diagnosis not present

## 2019-09-14 DIAGNOSIS — L814 Other melanin hyperpigmentation: Secondary | ICD-10-CM | POA: Diagnosis not present

## 2019-09-14 DIAGNOSIS — L570 Actinic keratosis: Secondary | ICD-10-CM | POA: Diagnosis not present

## 2019-09-16 ENCOUNTER — Encounter: Payer: Self-pay | Admitting: Cardiovascular Disease

## 2019-09-16 ENCOUNTER — Ambulatory Visit: Payer: PPO | Admitting: Cardiovascular Disease

## 2019-09-16 ENCOUNTER — Other Ambulatory Visit: Payer: Self-pay

## 2019-09-16 VITALS — BP 138/52 | HR 61 | Ht 64.0 in | Wt 198.0 lb

## 2019-09-16 DIAGNOSIS — I2510 Atherosclerotic heart disease of native coronary artery without angina pectoris: Secondary | ICD-10-CM | POA: Diagnosis not present

## 2019-09-16 DIAGNOSIS — R609 Edema, unspecified: Secondary | ICD-10-CM | POA: Diagnosis not present

## 2019-09-16 DIAGNOSIS — Z23 Encounter for immunization: Secondary | ICD-10-CM

## 2019-09-16 MED ORDER — ISOSORBIDE MONONITRATE ER 30 MG PO TB24: 30.0000 mg | ORAL_TABLET | Freq: Every day | ORAL | 3 refills | Status: DC

## 2019-09-16 MED ORDER — FUROSEMIDE 40 MG PO TABS: 40.0000 mg | ORAL_TABLET | Freq: Every day | ORAL | 3 refills | Status: DC | PRN

## 2019-09-16 MED ORDER — ROSUVASTATIN CALCIUM 5 MG PO TABS: 5.0000 mg | ORAL_TABLET | Freq: Every day | ORAL | 3 refills | Status: DC

## 2019-09-16 MED ORDER — CLOPIDOGREL BISULFATE 75 MG PO TABS: 75.0000 mg | ORAL_TABLET | Freq: Every day | ORAL | 3 refills | Status: DC

## 2019-09-16 MED ORDER — CARVEDILOL 6.25 MG PO TABS: 6.2500 mg | ORAL_TABLET | Freq: Two times a day (BID) | ORAL | 3 refills | Status: DC

## 2019-09-16 MED ORDER — AMLODIPINE BESYLATE 5 MG PO TABS: 5.0000 mg | ORAL_TABLET | Freq: Every day | ORAL | 3 refills | Status: DC

## 2019-09-16 NOTE — Patient Instructions (Addendum)
Medication Instructions:   *If you need a refill on your cardiac medications before your next appointment, please call your pharmacy*  Lab Work:  If you have labs (blood work) drawn today and your tests are completely normal, you will receive your results only by: Marland Kitchen MyChart Message (if you have MyChart) OR . A paper copy in the mail If you have any lab test that is abnormal or we need to change your treatment, we will call you to review the results.  Testing/Procedures: Your physician has requested that you have a lower extremity arterial duplex with ABI's in March.   Follow-Up: At [facility], you and your health needs are our priority.  As part of our continuing mission to provide you with exceptional heart care, we have created designated Provider Care Teams.  These Care Teams include your primary Cardiologist (physician) and Advanced Practice Providers (APPs -  Physician Assistants and Nurse Practitioners) who all work together to provide you with the care you need, when you need it.  Your next appointment:   12 months  The format for your next appointment:   In Person  Provider:   You may see Dr. Johnsie Cancel or one of the following Advanced Practice Providers on your designated Care Team:    Truitt Merle, NP  Cecilie Kicks, NP  Kathyrn Drown, NP

## 2019-09-16 NOTE — Progress Notes (Signed)
Patient ID: Danielle Murray, female   DOB: [birth year], 78 y.o.   MRN: [id number]      78 y.o. f/u CAD, HTN, HLD, IDDM.  08/2013 stent to LAD and medical Rx for small left sided PDA. Low risk myovue in June 2018 She is a twin and her sister died in 74.  Sees Balin for her DM. No angina. Some LE edema dependant  Myovue 04/12/17 reviewed EF 68% small area apical ischemia  Echo: EF [id number] grade 2 diastolic PA 24 mmHg  Lots of complaints with exertional dyspnea, poor balance and bladder irritability Lipitor caused more leg cramps better with crestor  Dr Shelia Media checked ABI's for her leg pain and they were only mildly reduced 01/15/19 left 0.8 and right 0.88 Seen by Dr [name] and conservative Rx warranted     ROS: Denies fever, malais, weight loss, blurry vision, decreased visual acuity, cough, sputum, SOB, hemoptysis, pleuritic pain, palpitaitons, heartburn, abdominal pain, melena, lower extremity edema, claudication, or rash.  All other systems reviewed and negative  General: Ht 5\' 4"  (1.626 m)   BMI 34.67 kg/m  Affect appropriate Healthy:  appears stated age 39: normal Neck supple with no adenopathy JVP normal no bruits no thyromegaly Lungs clear with no wheezing and good diaphragmatic motion Heart:  S1/S2 no murmur, no rub, gallop or click PMI normal Abdomen: benighn, BS positve, no tenderness, no AAA no bruit.  No HSM or HJR Distal pulses intact with no bruits Trace bilateral edema Neuro non-focal Skin warm and dry No muscular weakness   Current Outpatient Medications  Medication Sig Dispense Refill  . amLODipine (NORVASC) 5 MG tablet TAKE 1 TABLET (5 MG TOTAL) BY MOUTH DAILY. PLEASE MAKE ANNUAL APPT FOR FUTURE REFILLS. THANK YOU 90 tablet 1  . carvedilol (COREG) 6.25 MG tablet TAKE 1 TABLET BY MOUTH TWICE DAILY WITH MEALS 180 tablet 3  . Cholecalciferol (VITAMIN D3 PO) Take 1 tablet by mouth daily.    . clopidogrel (PLAVIX) 75 MG tablet Take 1 tablet (75 mg total) by mouth  daily. Please make overdue appt with Dr. Johnsie Cancel before anymore refills. 2nd attempt 15 tablet 0  . furosemide (LASIX) 40 MG tablet Take 40 mg by mouth daily as needed for fluid or edema.    . insulin NPH Human (HUMULIN N,NOVOLIN N) 100 UNIT/ML injection Inject 50 Units into the skin once. TAKE 15 UNITS IN THE MORNING AND 50 UNITS AT NIGHT.    Marland Kitchen isosorbide mononitrate (IMDUR) 30 MG 24 hr tablet Take 1 tablet (30 mg total) by mouth daily. 90 tablet 3  . levothyroxine (SYNTHROID, LEVOTHROID) 88 MCG tablet Take 88 mcg by mouth at bedtime.     . metFORMIN (GLUCOPHAGE) 500 MG tablet Take 1 tablet (500 mg total) by mouth daily.    . nitroGLYCERIN (NITROSTAT) 0.4 MG SL tablet Place 1 tablet (0.4 mg total) under the tongue every 5 (five) minutes as needed for chest pain (3 doses max). 25 tablet 3  . Probiotic Product (PROBIOTIC PO) Take 1 tablet by mouth as needed.     . rosuvastatin (CRESTOR) 5 MG tablet Take 1 tablet (5 mg total) by mouth daily. 90 tablet 3   No current facility-administered medications for this visit.     Allergies  Penicillins  Electrocardiogram:  09/16/19  SR rate 61 low voltage no changes   Assessment and Plan CAD: low risk myovue 05/02/17 small area of ischemia nitrates added continue medical Rx  DM: Discussed low carb diet.  Target hemoglobin  A1c is 6.5 or less.  Continue current medications. Sees Balin  Insulin adjusted  HTN: Well controlled.  Continue current medications and low sodium Dash type diet.   Thyroid  Continue replacement sees Balin for labs  Edema:  Continue Hyzaar and PRN lasix  elevation / support hose  Cholesterol:  Cramps improved on crestor   Dyspnea:  She declines maintenance rehab.  Functional  PVD:  Mild f/u ABI's in March She prefers not to see Dr [name] again f/u with Dr Fletcher Anon if ABI's worsen  Health:  maintenance flu shot given today    F/u  In a year  Jenkins Rouge

## 2019-09-17 ENCOUNTER — Other Ambulatory Visit: Payer: Self-pay | Admitting: Cardiovascular Disease

## 2019-09-19 ENCOUNTER — Other Ambulatory Visit: Payer: Self-pay | Admitting: Cardiovascular Disease

## 2019-10-18 ENCOUNTER — Other Ambulatory Visit: Payer: Self-pay | Admitting: Cardiovascular Disease

## 2019-12-29 ENCOUNTER — Other Ambulatory Visit (HOSPITAL_COMMUNITY): Payer: Self-pay | Admitting: Cardiovascular Disease

## 2019-12-29 DIAGNOSIS — I739 Peripheral vascular disease, unspecified: Secondary | ICD-10-CM

## 2020-01-13 DIAGNOSIS — I10 Essential (primary) hypertension: Secondary | ICD-10-CM | POA: Diagnosis not present

## 2020-01-13 DIAGNOSIS — E7800 Pure hypercholesterolemia, unspecified: Secondary | ICD-10-CM | POA: Diagnosis not present

## 2020-01-13 DIAGNOSIS — E039 Hypothyroidism, unspecified: Secondary | ICD-10-CM | POA: Diagnosis not present

## 2020-01-13 DIAGNOSIS — E1165 Type 2 diabetes mellitus with hyperglycemia: Secondary | ICD-10-CM | POA: Diagnosis not present

## 2020-01-14 ENCOUNTER — Other Ambulatory Visit: Payer: Self-pay

## 2020-01-14 ENCOUNTER — Ambulatory Visit: Payer: PPO | Attending: Internal Medicine

## 2020-01-14 ENCOUNTER — Ambulatory Visit (HOSPITAL_COMMUNITY)
Admission: RE | Admit: 2020-01-14 | Discharge: 2020-01-14 | Disposition: A | Discharge status: Home / Self Care | Payer: PPO | Source: Ambulatory Visit | Attending: Cardiology | Admitting: Cardiology

## 2020-01-14 DIAGNOSIS — Z23 Encounter for immunization: Secondary | ICD-10-CM | POA: Insufficient documentation

## 2020-01-14 DIAGNOSIS — I739 Peripheral vascular disease, unspecified: Secondary | ICD-10-CM | POA: Diagnosis not present

## 2020-01-14 DIAGNOSIS — I2510 Atherosclerotic heart disease of native coronary artery without angina pectoris: Secondary | ICD-10-CM | POA: Diagnosis not present

## 2020-01-14 DIAGNOSIS — R609 Edema, unspecified: Secondary | ICD-10-CM

## 2020-01-14 NOTE — Progress Notes (Signed)
   Covid-19 Vaccination Clinic  Name:  Danielle Murray    MRN: [id number] DOB: 03-21-1941  01/14/2020  Danielle Murray was observed post Covid-19 immunization for 15 minutes without incident. She was provided with Vaccine Information Sheet and instruction to access the V-Safe system.   Danielle Murray was instructed to call 911 with any severe reactions post vaccine: Marland Kitchen Difficulty breathing  . Swelling of face and throat  . A fast heartbeat  . A bad rash all over body  . Dizziness and weakness   Immunizations Administered    Name Date Dose VIS Date Route   Pfizer COVID-19 Vaccine 01/14/2020  8:30 AM 0.3 mL 10/23/2019 Intramuscular   Manufacturer: [place]   Lot: [id number]   [place]: [id number]

## 2020-01-18 DIAGNOSIS — G2581 Restless legs syndrome: Secondary | ICD-10-CM | POA: Diagnosis not present

## 2020-01-18 DIAGNOSIS — I2510 Atherosclerotic heart disease of native coronary artery without angina pectoris: Secondary | ICD-10-CM | POA: Diagnosis not present

## 2020-01-18 DIAGNOSIS — I739 Peripheral vascular disease, unspecified: Secondary | ICD-10-CM | POA: Diagnosis not present

## 2020-01-18 DIAGNOSIS — L309 Dermatitis, unspecified: Secondary | ICD-10-CM | POA: Diagnosis not present

## 2020-01-18 DIAGNOSIS — I10 Essential (primary) hypertension: Secondary | ICD-10-CM | POA: Diagnosis not present

## 2020-01-18 DIAGNOSIS — Z0000 Encounter for general adult medical examination without abnormal findings: Secondary | ICD-10-CM | POA: Diagnosis not present

## 2020-01-18 DIAGNOSIS — E1121 Type 2 diabetes mellitus with diabetic nephropathy: Secondary | ICD-10-CM | POA: Diagnosis not present

## 2020-01-18 DIAGNOSIS — F4321 Adjustment disorder with depressed mood: Secondary | ICD-10-CM | POA: Diagnosis not present

## 2020-01-18 DIAGNOSIS — E875 Hyperkalemia: Secondary | ICD-10-CM | POA: Diagnosis not present

## 2020-01-18 DIAGNOSIS — Z0001 Encounter for general adult medical examination with abnormal findings: Secondary | ICD-10-CM | POA: Diagnosis not present

## 2020-01-18 DIAGNOSIS — E039 Hypothyroidism, unspecified: Secondary | ICD-10-CM | POA: Diagnosis not present

## 2020-01-28 DIAGNOSIS — I10 Essential (primary) hypertension: Secondary | ICD-10-CM | POA: Diagnosis not present

## 2020-01-28 DIAGNOSIS — E1121 Type 2 diabetes mellitus with diabetic nephropathy: Secondary | ICD-10-CM | POA: Diagnosis not present

## 2020-01-28 DIAGNOSIS — E7800 Pure hypercholesterolemia, unspecified: Secondary | ICD-10-CM | POA: Diagnosis not present

## 2020-01-28 DIAGNOSIS — E039 Hypothyroidism, unspecified: Secondary | ICD-10-CM | POA: Diagnosis not present

## 2020-02-09 ENCOUNTER — Ambulatory Visit: Payer: PPO | Attending: Internal Medicine

## 2020-02-09 DIAGNOSIS — Z23 Encounter for immunization: Secondary | ICD-10-CM

## 2020-02-09 NOTE — Progress Notes (Signed)
   Covid-19 Vaccination Clinic  Name:  STEVA MULANAX    MRN: [id number] DOB: [birth year]  02/09/2020  Ms. Moon was observed post Covid-19 immunization for 15 minutes without incident. She was provided with Vaccine Information Sheet and instruction to access the V-Safe system.   Ms. Noia was instructed to call 911 with any severe reactions post vaccine: Marland Kitchen Difficulty breathing  . Swelling of face and throat  . A fast heartbeat  . A bad rash all over body  . Dizziness and weakness   Immunizations Administered    Name Date Dose VIS Date Route   Pfizer COVID-19 Vaccine 02/09/2020 12:51 PM 0.3 mL 10/23/2019 Intramuscular   Manufacturer: [place]   Lot: [id number]   [place]: [id number]

## 2020-03-14 DIAGNOSIS — I10 Essential (primary) hypertension: Secondary | ICD-10-CM | POA: Diagnosis not present

## 2020-03-14 DIAGNOSIS — F4321 Adjustment disorder with depressed mood: Secondary | ICD-10-CM | POA: Diagnosis not present

## 2020-03-14 DIAGNOSIS — L309 Dermatitis, unspecified: Secondary | ICD-10-CM | POA: Diagnosis not present

## 2020-03-14 DIAGNOSIS — E1121 Type 2 diabetes mellitus with diabetic nephropathy: Secondary | ICD-10-CM | POA: Diagnosis not present

## 2020-03-14 DIAGNOSIS — E039 Hypothyroidism, unspecified: Secondary | ICD-10-CM | POA: Diagnosis not present

## 2020-03-28 ENCOUNTER — Encounter (INDEPENDENT_AMBULATORY_CARE_PROVIDER_SITE_OTHER): Payer: Self-pay | Admitting: Ophthalmology

## 2020-03-28 ENCOUNTER — Other Ambulatory Visit: Payer: Self-pay

## 2020-03-28 ENCOUNTER — Ambulatory Visit (INDEPENDENT_AMBULATORY_CARE_PROVIDER_SITE_OTHER): Payer: PPO | Admitting: Ophthalmology

## 2020-03-28 DIAGNOSIS — E1151 Type 2 diabetes mellitus with diabetic peripheral angiopathy without gangrene: Secondary | ICD-10-CM | POA: Diagnosis not present

## 2020-03-28 DIAGNOSIS — L03213 Periorbital cellulitis: Secondary | ICD-10-CM | POA: Diagnosis not present

## 2020-03-28 DIAGNOSIS — E1165 Type 2 diabetes mellitus with hyperglycemia: Secondary | ICD-10-CM | POA: Diagnosis not present

## 2020-03-28 DIAGNOSIS — H35031 Hypertensive retinopathy, right eye: Secondary | ICD-10-CM

## 2020-03-28 DIAGNOSIS — E113293 Type 2 diabetes mellitus with mild nonproliferative diabetic retinopathy without macular edema, bilateral: Secondary | ICD-10-CM

## 2020-03-28 DIAGNOSIS — H43812 Vitreous degeneration, left eye: Secondary | ICD-10-CM | POA: Diagnosis not present

## 2020-03-28 DIAGNOSIS — E113393 Type 2 diabetes mellitus with moderate nonproliferative diabetic retinopathy without macular edema, bilateral: Secondary | ICD-10-CM | POA: Insufficient documentation

## 2020-03-28 DIAGNOSIS — H43811 Vitreous degeneration, right eye: Secondary | ICD-10-CM | POA: Diagnosis not present

## 2020-03-28 DIAGNOSIS — IMO0002 Reserved for concepts with insufficient information to code with codable children: Secondary | ICD-10-CM

## 2020-03-28 NOTE — Progress Notes (Signed)
03/28/2020     CHIEF COMPLAINT Patient presents for Diabetic Eye Exam   HISTORY OF PRESENT ILLNESS: Danielle Murray is a 79 y.o. female who presents to the clinic today for:   HPI    Diabetic Eye Exam    Vision is stable.  Associated Symptoms Negative for Flashes and Floaters.  Diabetes characteristics include Type 2.  Blood sugar level is controlled.  Last Blood Glucose 109.  I, the attending physician,  performed the HPI with the patient and updated documentation appropriately.          Comments    1 Year Diabetic Exam OU. OCT  Pt states vision is stable. Denies FOL and floaters. Pt states OU become tired easily. A1C: unknown       Last edited by Tilda Franco on 03/28/2020  9:37 AM. (History)      Referring physician: [name], MD [place],  [place] [id number]  HISTORICAL INFORMATION:   Selected notes from the MEDICAL RECORD NUMBER    Lab Results  Component Value Date   HGBA1C 9.2 (H) 03/10/2015     CURRENT MEDICATIONS: No current outpatient medications on file. (Ophthalmic Drugs)   No current facility-administered medications for this visit. (Ophthalmic Drugs)   Current Outpatient Medications (Other)  Medication Sig  . amLODipine (NORVASC) 5 MG tablet Take 1 tablet (5 mg total) by mouth daily.  . carvedilol (COREG) 6.25 MG tablet Take 1 tablet (6.25 mg total) by mouth 2 (two) times daily with a meal.  . Cholecalciferol (VITAMIN D3 PO) Take 1 tablet by mouth daily.  . furosemide (LASIX) 40 MG tablet Take 1 tablet (40 mg total) by mouth daily as needed for fluid or edema.  . insulin NPH Human (HUMULIN N,NOVOLIN N) 100 UNIT/ML injection Inject 50 Units into the skin once. TAKE 15 UNITS IN THE MORNING AND 50 UNITS AT NIGHT.  Marland Kitchen isosorbide mononitrate (IMDUR) 30 MG 24 hr tablet Take 1 tablet (30 mg total) by mouth daily.  Marland Kitchen levothyroxine (SYNTHROID, LEVOTHROID) 88 MCG tablet Take 88 mcg by mouth at bedtime.   . metFORMIN  (GLUCOPHAGE) 500 MG tablet Take 1 tablet (500 mg total) by mouth daily.  . nitroGLYCERIN (NITROSTAT) 0.4 MG SL tablet Place 1 tablet (0.4 mg total) under the tongue every 5 (five) minutes as needed for chest pain (3 doses max).  . Probiotic Product (PROBIOTIC PO) Take 1 tablet by mouth as needed.   . rosuvastatin (CRESTOR) 5 MG tablet Take 1 tablet by mouth once daily   No current facility-administered medications for this visit. (Other)      REVIEW OF SYSTEMS: ROS    Positive for: Endocrine   Last edited by Tilda Franco on 03/28/2020  9:36 AM. (History)       ALLERGIES Allergies  Allergen Reactions  . Penicillins     Diffuse joint pain @ age 72 in context of Scarlet Fever    PAST MEDICAL HISTORY Past Medical History:  Diagnosis Date  . Arthritis    "fingers, all my joints" (09/03/2013)  . Coronary artery disease    a. 08/2013: unstable angina s/p PTCA/DES to LAD, medical therapy for residual severe stenosis of a mid-distal left PDA branch of large dominant LCx, moderate RCA disease (consider PCI of L-PDA if she fails med rx).  . DEPRESSION   . Diverticulosis   . Hyperlipidemia   . Hypertension   . HYPOTHYROIDISM   . SVT (supraventricular tachycardia) ([place])  a. very brief transient SVT during 08/2013 admission overnight.  . Type II diabetes mellitus ([place])    Dr Chalmers Cater   Past Surgical History:  Procedure Laterality Date  . ABDOMINAL HYSTERECTOMY  1989   no BSO; dysfunctional menses  . CATARACT EXTRACTION W/ INTRAOCULAR LENS  IMPLANT, BILATERAL Bilateral 2014  . CATARACT EXTRACTION W/PHACO Right 07/02/2013  . CATARACT EXTRACTION W/PHACO Left 06/01/2013  . COLONOSCOPY  2003   Tics  . CORONARY ANGIOPLASTY WITH STENT PLACEMENT  09/03/2013   "1" (09/03/2013)  . LEFT HEART CATHETERIZATION WITH CORONARY ANGIOGRAM N/A 09/03/2013   Procedure: LEFT HEART CATHETERIZATION WITH CORONARY ANGIOGRAM;  Surgeon: Blane Ohara, MD;  Location: [facility] CATH LAB;  Service:  Cardiovascular;  Laterality: N/A;  . TOTAL KNEE ARTHROPLASTY Left 06/2005   Dr Gladstone Lighter    FAMILY HISTORY Family History  Problem Relation Age of Onset  . Asthma Mother   . Hypertension Mother   . Heart failure Mother   . Hypertension Father   . Stroke Father 63  . Heart attack Father 66  . Hypertension Sister   . Hyperlipidemia Sister   . Heart attack Sister        pre 79; Twin  . Diabetes Sister        her TWIN  . Hypertension Brother   . Hyperlipidemia Brother   . Coronary artery disease Brother        stents late 108s  . Breast cancer Neg Hx     SOCIAL HISTORY Social History   Tobacco Use  . Smoking status: Never Smoker  . Smokeless tobacco: Never Used  Substance Use Topics  . Alcohol use: Yes    Comment: 09/03/2013 "1-2 times/yr"  . Drug use: No         OPHTHALMIC EXAM: Base Eye Exam    Visual Acuity (Snellen - Linear)      Right Left   Dist [place] 20/30 + 20/30 +       Tonometry (Tonopen, 9:43 AM)      Right Left   Pressure 9 10       Pupils      Pupils Dark Light Shape React APD   Right PERRL 3.5 2 Round Brisk None   Left PERRL 3 2 Round Brisk None       Visual Fields (Counting fingers)      Left Right    Full Full       Neuro/Psych    Oriented x3: Yes   Mood/Affect: Normal       Dilation    Both eyes: 1.0% Mydriacyl, 2.5% Phenylephrine @ 9:43 AM        Slit Lamp and Fundus Exam    Slit Lamp Exam      Right Left   Lens Posterior chamber intraocular lens Posterior chamber intraocular lens          IMAGING AND PROCEDURES  Imaging and Procedures for 03/28/20  OCT, Retina - OU - Both Eyes       Right Eye Quality was good. Scan locations included subfoveal. Progression has been stable. Findings include normal observations.   Left Eye Quality was good. Scan locations included subfoveal. Progression has been stable.   Notes Posterior vitreous detachment OU.                ASSESSMENT/PLAN:  No problem-specific  Assessment & Plan notes found for this encounter.      ICD-10-CM   1. Preseptal cellulitis of right upper eyelid  L03.213   2. Nonproliferative diabetic retinopathy of both eyes ([place])  E11.3293 OCT, Retina - OU - Both Eyes  3. Hypertensive retinopathy of right eye  H35.031   4. Posterior vitreous detachment of left eye  H43.812   5. Mild nonproliferative diabetic retinopathy of both eyes without macular edema associated with type 2 diabetes mellitus ([place])  [id number]   6. Type 2 diabetes, uncontrolled, with peripheral circulatory disorder (HCC)  E11.51    E11.65     1.  2.  3.  Ophthalmic Meds Ordered this visit:  No orders of the defined types were placed in this encounter.      No follow-ups on file.  There are no Patient Instructions on file for this visit.   Explained the diagnoses, plan, and follow up with the patient and they expressed understanding.  Patient expressed understanding of the importance of proper follow up care.   Clent Demark Lyrah Bradt M.D. Diseases & Surgery of the Retina and Vitreous Retina & Diabetic [place] 03/28/20     Abbreviations: M myopia (nearsighted); A astigmatism; H hyperopia (farsighted); P presbyopia; Mrx spectacle prescription;  CTL contact lenses; OD right eye; OS left eye; OU both eyes  XT exotropia; ET esotropia; PEK punctate epithelial keratitis; PEE punctate epithelial erosions; DES dry eye syndrome; MGD meibomian gland dysfunction; ATs artificial tears; PFAT's preservative free artificial tears; [place] nuclear sclerotic cataract; PSC posterior subcapsular cataract; ERM epi-retinal membrane; PVD posterior vitreous detachment; RD retinal detachment; DM diabetes mellitus; DR diabetic retinopathy; NPDR non-proliferative diabetic retinopathy; PDR proliferative diabetic retinopathy; CSME clinically significant macular edema; DME diabetic macular edema; dbh dot blot hemorrhages; CWS cotton wool spot; POAG primary open angle glaucoma; C/D cup-to-disc  ratio; HVF humphrey visual field; GVF goldmann visual field; OCT optical coherence tomography; IOP intraocular pressure; BRVO Branch retinal vein occlusion; CRVO central retinal vein occlusion; CRAO central retinal artery occlusion; BRAO branch retinal artery occlusion; RT retinal tear; SB scleral buckle; PPV pars plana vitrectomy; VH Vitreous hemorrhage; PRP panretinal laser photocoagulation; IVK intravitreal kenalog; VMT vitreomacular traction; MH Macular hole;  NVD neovascularization of the disc; NVE neovascularization elsewhere; AREDS age related eye disease study; ARMD age related macular degeneration; POAG primary open angle glaucoma; EBMD epithelial/anterior basement membrane dystrophy; ACIOL anterior chamber intraocular lens; IOL intraocular lens; PCIOL posterior chamber intraocular lens; Phaco/IOL phacoemulsification with intraocular lens placement; [place] photorefractive keratectomy; LASIK laser assisted in situ keratomileusis; HTN hypertension; DM diabetes mellitus; COPD chronic obstructive pulmonary disease

## 2020-06-23 ENCOUNTER — Telehealth: Payer: Self-pay | Admitting: *Deleted

## 2020-06-23 NOTE — Telephone Encounter (Signed)
A detailed message was left, re: her follow up visit. 

## 2020-07-19 DIAGNOSIS — E1121 Type 2 diabetes mellitus with diabetic nephropathy: Secondary | ICD-10-CM | POA: Diagnosis not present

## 2020-07-19 DIAGNOSIS — I2510 Atherosclerotic heart disease of native coronary artery without angina pectoris: Secondary | ICD-10-CM | POA: Diagnosis not present

## 2020-07-19 DIAGNOSIS — I10 Essential (primary) hypertension: Secondary | ICD-10-CM | POA: Diagnosis not present

## 2020-07-19 DIAGNOSIS — E7800 Pure hypercholesterolemia, unspecified: Secondary | ICD-10-CM | POA: Diagnosis not present

## 2020-07-19 DIAGNOSIS — E039 Hypothyroidism, unspecified: Secondary | ICD-10-CM | POA: Diagnosis not present

## 2020-07-19 DIAGNOSIS — E1165 Type 2 diabetes mellitus with hyperglycemia: Secondary | ICD-10-CM | POA: Diagnosis not present

## 2020-07-21 DIAGNOSIS — Z01419 Encounter for gynecological examination (general) (routine) without abnormal findings: Secondary | ICD-10-CM | POA: Diagnosis not present

## 2020-07-26 DIAGNOSIS — E039 Hypothyroidism, unspecified: Secondary | ICD-10-CM | POA: Diagnosis not present

## 2020-07-26 DIAGNOSIS — I10 Essential (primary) hypertension: Secondary | ICD-10-CM | POA: Diagnosis not present

## 2020-07-26 DIAGNOSIS — E7800 Pure hypercholesterolemia, unspecified: Secondary | ICD-10-CM | POA: Diagnosis not present

## 2020-07-26 DIAGNOSIS — E1121 Type 2 diabetes mellitus with diabetic nephropathy: Secondary | ICD-10-CM | POA: Diagnosis not present

## 2020-08-24 ENCOUNTER — Ambulatory Visit: Payer: PPO | Admitting: Cardiovascular Disease

## 2020-08-24 ENCOUNTER — Other Ambulatory Visit: Payer: Self-pay

## 2020-08-24 ENCOUNTER — Encounter: Payer: Self-pay | Admitting: Cardiovascular Disease

## 2020-08-24 DIAGNOSIS — I739 Peripheral vascular disease, unspecified: Secondary | ICD-10-CM

## 2020-08-24 NOTE — Progress Notes (Signed)
08/24/2020 Adriene Knipfer Hughley   1941-08-02  [id number]  Primary Physician [name], MD Primary Cardiologist: Lorretta Harp MD [name], [place]  HPI:  Danielle Murray is a 79 y.o.  moderately overweight married Caucasian female mother of 2 sons, with no grandchildren who was referred by Dr. Shelia Media for peripheral vascular valuation because of abnormal Doppler studies and poorly palpable left pedal pulses.  Her cardiologist is Dr. Johnsie Cancel.  I last saw her in the office 02/04/2019. She is retired from being the Counsellor for [employer].  She has a history of CAD status post LAD stenting by Dr. Burt Knack 10/14.  She also has treated hypertension, diabetes and hyperlipidemia as well as family history with a father who died of myocardial infarction and brother who has stents.  She has had a left total knee replacement.  She had Dopplers performed in Dr. Pennie Banter office because of a poorly palpable left pedal pulses that revealed a right ABI of 0.88 and a left ABI of 0.8.  She apparently had an occluded left posterior tibial artery.  She has no evidence of critical limb ischemia and on further questioning it does not appear that she has significant lifestyle of any claudication.  She does have restless leg syndrome however.  Since I saw her a year and a half ago she continues to deny claudication type symptoms.  She did have repeat Doppler studies performed 01/14/2020 revealing a right ABI of 0.96 and a left of 0.83.  There were no high-frequency signals in large vessels.    Current Meds  Medication Sig  . amLODipine (NORVASC) 5 MG tablet Take 1 tablet (5 mg total) by mouth daily.  . carvedilol (COREG) 6.25 MG tablet Take 1 tablet (6.25 mg total) by mouth 2 (two) times daily with a meal.  . Cholecalciferol (VITAMIN D3 PO) Take 1 tablet by mouth daily.  . clopidogrel (PLAVIX) 75 MG tablet Take 75 mg by mouth daily.  . Dulaglutide (TRULICITY) 8.33 AS/5.0NL SOPN Inject into the skin once a  week.  . furosemide (LASIX) 40 MG tablet Take 1 tablet (40 mg total) by mouth daily as needed for fluid or edema.  . insulin NPH Human (HUMULIN N,NOVOLIN N) 100 UNIT/ML injection Inject 50 Units into the skin once. TAKE 15 UNITS IN THE MORNING AND 50 UNITS AT NIGHT.  Marland Kitchen isosorbide mononitrate (IMDUR) 30 MG 24 hr tablet Take 1 tablet (30 mg total) by mouth daily.  Marland Kitchen levothyroxine (SYNTHROID, LEVOTHROID) 88 MCG tablet Take 88 mcg by mouth at bedtime.   . metFORMIN (GLUCOPHAGE) 500 MG tablet Take 1 tablet (500 mg total) by mouth daily.  . nitroGLYCERIN (NITROSTAT) 0.4 MG SL tablet Place 1 tablet (0.4 mg total) under the tongue every 5 (five) minutes as needed for chest pain (3 doses max).  . Probiotic Product (PROBIOTIC PO) Take 1 tablet by mouth as needed.   . rosuvastatin (CRESTOR) 5 MG tablet Take 1 tablet by mouth once daily     Allergies  Allergen Reactions  . Penicillins     Diffuse joint pain @ age 10 in context of Scarlet Fever    Social History   Socioeconomic History  . Marital status: Married    Spouse name: Not on file  . Number of children: Not on file  . Years of education: Not on file  . Highest education level: Not on file  Occupational History  . Not on file  Tobacco Use  . Smoking status:  Never Smoker  . Smokeless tobacco: Never Used  Vaping Use  . Vaping Use: Never used  Substance and Sexual Activity  . Alcohol use: Yes    Comment: 09/03/2013 "1-2 times/yr"  . Drug use: No  . Sexual activity: Yes  Other Topics Concern  . Not on file  Social History Narrative  . Not on file   Social Determinants of Health   Financial Resource Strain:   . Difficulty of Paying Living Expenses: Not on file  Food Insecurity:   . Worried About Charity fundraiser in the Last Year: Not on file  . Ran Out of Food in the Last Year: Not on file  Transportation Needs:   . Lack of Transportation (Medical): Not on file  . Lack of Transportation (Non-Medical): Not on file  Physical  Activity:   . Days of Exercise per Week: Not on file  . Minutes of Exercise per Session: Not on file  Stress:   . Feeling of Stress : Not on file  Social Connections:   . Frequency of Communication with Friends and Family: Not on file  . Frequency of Social Gatherings with Friends and Family: Not on file  . Attends Religious Services: Not on file  . Active Member of Clubs or Organizations: Not on file  . Attends Archivist Meetings: Not on file  . Marital Status: Not on file  Intimate Partner Violence:   . Fear of Current or Ex-Partner: Not on file  . Emotionally Abused: Not on file  . Physically Abused: Not on file  . Sexually Abused: Not on file     Review of Systems: General: negative for chills, fever, night sweats or weight changes.  Cardiovascular: negative for chest pain, dyspnea on exertion, edema, orthopnea, palpitations, paroxysmal nocturnal dyspnea or shortness of breath Dermatological: negative for rash Respiratory: negative for cough or wheezing Urologic: negative for hematuria Abdominal: negative for nausea, vomiting, diarrhea, bright red blood per rectum, melena, or hematemesis Neurologic: negative for visual changes, syncope, or dizziness All other systems reviewed and are otherwise negative except as noted above.    Blood pressure 138/72, pulse 74, height 5\' 4"  (1.626 m), weight 194 lb 9.6 oz (88.3 kg), SpO2 94 %.  General appearance: alert and no distress Neck: no adenopathy, no carotid bruit, no JVD, supple, symmetrical, trachea midline and thyroid not enlarged, symmetric, no tenderness/mass/nodules Lungs: clear to auscultation bilaterally Heart: regular rate and rhythm, S1, S2 normal, no murmur, click, rub or gallop Extremities: extremities normal, atraumatic, no cyanosis or edema Pulses: 2+ and symmetric Skin: Skin color, texture, turgor normal. No rashes or lesions Neurologic: Alert and oriented X 3, normal strength and tone. Normal symmetric  reflexes. Normal coordination and gait  EKG not performed today  ASSESSMENT AND PLAN:   Peripheral arterial disease ([place]) This finds is being seen back for follow-up of PAD.  I last saw her in the office 02/04/2019.  Her most recent Doppler studies performed 01/14/2020 revealed a right ABI of 0.96 and a left of 0.83.  There were no high-frequency signal suggesting obstructive disease in the large vessels.  She did have some tibial vessel disease.  She really denies claudication.  I will see her back on as-needed basis.      Lorretta Harp MD FACP,FACC,FAHA, [facility] 08/24/2020 10:21 AM

## 2020-08-24 NOTE — Assessment & Plan Note (Signed)
This finds is being seen back for follow-up of PAD.  I last saw her in the office 02/04/2019.  Her most recent Doppler studies performed 01/14/2020 revealed a right ABI of 0.96 and a left of 0.83.  There were no high-frequency signal suggesting obstructive disease in the large vessels.  She did have some tibial vessel disease.  She really denies claudication.  I will see her back on as-needed basis.

## 2020-08-24 NOTE — Patient Instructions (Signed)
Medication Instructions:  The current medical regimen is effective;  continue present plan and medications as directed. Please refer to the Current Medication list given to you today. *If you need a refill on your cardiac medications before your next appointment, please call your pharmacy*  Lab Work:     NONE      Testing/Procedures: Your physician has requested that you have a lower extremity arterial exercise duplex. During this test, exercise and ultrasound are used to evaluate arterial blood flow in the legs. Allow one hour for this exam. There are no restrictions or special instructions.  Your next appointment:  AS NEEDED  with You may see DR [name] or one of the following Advanced Practice Providers on your designated Care Team:  Kerin Ransom, PA-C [place], [place]  Coletta Memos, FNP  Please call our office 2 months in advance to schedule this appointment   At [facility], you and your health needs are our priority.  As part of our continuing mission to provide you with exceptional heart care, we have created designated Provider Care Teams.  These Care Teams include your primary Cardiologist (physician) and Advanced Practice Providers (APPs -  Physician Assistants and Nurse Practitioners) who all work together to provide you with the care you need, when you need it.  We recommend signing up for the patient portal called "MyChart".  Sign up information is provided on this After Visit Summary.  MyChart is used to connect with patients for Virtual Visits (Telemedicine).  Patients are able to view lab/test results, encounter notes, upcoming appointments, etc.  Non-urgent messages can be sent to your provider as well.   To learn more about what you can do with MyChart, go to [email or web address].

## 2020-09-06 DIAGNOSIS — I2510 Atherosclerotic heart disease of native coronary artery without angina pectoris: Secondary | ICD-10-CM | POA: Diagnosis not present

## 2020-09-06 DIAGNOSIS — E039 Hypothyroidism, unspecified: Secondary | ICD-10-CM | POA: Diagnosis not present

## 2020-09-06 DIAGNOSIS — E1121 Type 2 diabetes mellitus with diabetic nephropathy: Secondary | ICD-10-CM | POA: Diagnosis not present

## 2020-09-06 DIAGNOSIS — E782 Mixed hyperlipidemia: Secondary | ICD-10-CM | POA: Diagnosis not present

## 2020-09-06 DIAGNOSIS — I10 Essential (primary) hypertension: Secondary | ICD-10-CM | POA: Diagnosis not present

## 2020-09-14 DIAGNOSIS — Z85828 Personal history of other malignant neoplasm of skin: Secondary | ICD-10-CM | POA: Diagnosis not present

## 2020-09-14 DIAGNOSIS — L570 Actinic keratosis: Secondary | ICD-10-CM | POA: Diagnosis not present

## 2020-09-14 DIAGNOSIS — L821 Other seborrheic keratosis: Secondary | ICD-10-CM | POA: Diagnosis not present

## 2020-09-14 DIAGNOSIS — D485 Neoplasm of uncertain behavior of skin: Secondary | ICD-10-CM | POA: Diagnosis not present

## 2020-09-14 DIAGNOSIS — D225 Melanocytic nevi of trunk: Secondary | ICD-10-CM | POA: Diagnosis not present

## 2020-09-14 DIAGNOSIS — L814 Other melanin hyperpigmentation: Secondary | ICD-10-CM | POA: Diagnosis not present

## 2020-10-11 NOTE — Progress Notes (Signed)
Patient ID: Danielle Murray, female   DOB: 1941-08-19, 79 y.o.   MRN: [id number]      79 y.o. f/u CAD, HTN, HLD, IDDM.  08/2013 stent to LAD and medical Rx for small left sided PDA. Low risk myovue in June 2018 She is a twin and her sister died in 43.  Sees [facility]in for her DM. No angina. Some LE edema dependant  Myovue 04/12/17 reviewed EF 68% small area apical ischemia  Echo: EF 26-33% grade 2 diastolic PA 24 mmHg  Leg cramps better on crestor rather than lipitor   Seen by Dr [name] in October stable although she indicated previously she wanted to f/u with someone else   Lots of somatic complaints Dyspnea with exertion and poor stamina    ROS: Denies fever, malais, weight loss, blurry vision, decreased visual acuity, cough, sputum, SOB, hemoptysis, pleuritic pain, palpitaitons, heartburn, abdominal pain, melena, lower extremity edema, claudication, or rash.  All other systems reviewed and negative  General: BP 140/60   Pulse 67   Ht 5\' 4"  (1.626 m)   Wt 87.5 kg   SpO2 94%   BMI 33.13 kg/m  Affect appropriate Healthy:  appears stated age 56: normal Neck supple with no adenopathy JVP normal no bruits no thyromegaly Lungs clear with no wheezing and good diaphragmatic motion Heart:  S1/S2 no murmur, no rub, gallop or click PMI normal Abdomen: benighn, BS positve, no tenderness, no AAA no bruit.  No HSM or HJR Distal pulses intact with no bruits Trace bilateral edema Neuro non-focal Skin warm and dry No muscular weakness   Current Outpatient Medications  Medication Sig Dispense Refill  . amLODipine (NORVASC) 5 MG tablet Take 1 tablet (5 mg total) by mouth daily. 90 tablet 3  . carvedilol (COREG) 6.25 MG tablet Take 1 tablet (6.25 mg total) by mouth 2 (two) times daily with a meal. 180 tablet 3  . Cholecalciferol (VITAMIN D3 PO) Take 1 tablet by mouth daily.    . clopidogrel (PLAVIX) 75 MG tablet Take 75 mg by mouth daily.    . Dulaglutide (TRULICITY) 3.54 TG/2.5WL SOPN  Inject into the skin once a week.    . furosemide (LASIX) 40 MG tablet Take 1 tablet (40 mg total) by mouth daily as needed for fluid or edema. 90 tablet 3  . insulin NPH Human (HUMULIN N,NOVOLIN N) 100 UNIT/ML injection Inject 50 Units into the skin once. TAKE 15 UNITS IN THE MORNING AND 50 UNITS AT NIGHT.    Marland Kitchen isosorbide mononitrate (IMDUR) 30 MG 24 hr tablet Take 1 tablet (30 mg total) by mouth daily. 90 tablet 3  . levothyroxine (SYNTHROID, LEVOTHROID) 88 MCG tablet Take 88 mcg by mouth at bedtime.     . metFORMIN (GLUCOPHAGE) 500 MG tablet Take 1 tablet (500 mg total) by mouth daily.    . nitroGLYCERIN (NITROSTAT) 0.4 MG SL tablet Place 1 tablet (0.4 mg total) under the tongue every 5 (five) minutes as needed for chest pain (3 doses max). 25 tablet 3  . Probiotic Product (PROBIOTIC PO) Take 1 tablet by mouth as needed.     . rosuvastatin (CRESTOR) 5 MG tablet Take 1 tablet by mouth once daily 90 tablet 3   No current facility-administered medications for this visit.    Allergies  Penicillins  Electrocardiogram:  09/16/19  SR rate 61 low voltage no changes 10/14/2020 SR rate 67 LAD low voltage no acute changes   Assessment and Plan CAD: low risk myovue 05/02/17 small area  of ischemia nitrates added continue medical Rx  DM: Discussed low carb diet.  Target hemoglobin A1c is 6.5 or less.  Continue current medications. F/U with Balin to adjust insulin  HTN: Well controlled.  Continue current medications and low sodium Dash type diet.   Thyroid  Continue replacement sees Balin for labs  Edema:  Continue diuretics low sodium diet and elevation with support hose  Cholesterol:  Continue statin labs with primary  PVD:  ABI reduced on left 0.83 f/u Dr Myrene Galas no claudication  Dyspnea:  F/u echo to make sure EF still normal    F/u  In 6 months with me and [name] in a year   Jenkins Rouge

## 2020-10-14 ENCOUNTER — Ambulatory Visit: Payer: PPO | Admitting: Cardiovascular Disease

## 2020-10-14 ENCOUNTER — Other Ambulatory Visit: Payer: Self-pay

## 2020-10-14 ENCOUNTER — Encounter: Payer: Self-pay | Admitting: Cardiovascular Disease

## 2020-10-14 VITALS — BP 140/60 | HR 67 | Ht 64.0 in | Wt 193.0 lb

## 2020-10-14 DIAGNOSIS — I2510 Atherosclerotic heart disease of native coronary artery without angina pectoris: Secondary | ICD-10-CM

## 2020-10-14 DIAGNOSIS — I739 Peripheral vascular disease, unspecified: Secondary | ICD-10-CM | POA: Diagnosis not present

## 2020-10-14 MED ORDER — AMLODIPINE BESYLATE 5 MG PO TABS: 5.0000 mg | ORAL_TABLET | Freq: Every day | ORAL | 3 refills | Status: DC

## 2020-10-14 MED ORDER — CARVEDILOL 6.25 MG PO TABS
6.2500 mg | ORAL_TABLET | Freq: Two times a day (BID) | ORAL | 3 refills | Status: DC
Start: 2020-10-14 — End: 2021-11-14

## 2020-10-14 MED ORDER — ISOSORBIDE MONONITRATE ER 30 MG PO TB24
30.0000 mg | ORAL_TABLET | Freq: Every day | ORAL | 3 refills | Status: DC
Start: 2020-10-14 — End: 2021-11-14

## 2020-10-14 MED ORDER — ROSUVASTATIN CALCIUM 5 MG PO TABS: 5.0000 mg | ORAL_TABLET | Freq: Every day | ORAL | 3 refills | Status: DC

## 2020-10-14 MED ORDER — CLOPIDOGREL BISULFATE 75 MG PO TABS: 75.0000 mg | ORAL_TABLET | Freq: Every day | ORAL | 3 refills | Status: DC

## 2020-10-14 MED ORDER — FUROSEMIDE 40 MG PO TABS: 40.0000 mg | ORAL_TABLET | Freq: Every day | ORAL | 3 refills | Status: DC | PRN

## 2020-10-14 NOTE — Patient Instructions (Signed)
Medication Instructions:  Your physician recommends that you continue on your current medications as directed. Please refer to the Current Medication list given to you today.  *If you need a refill on your cardiac medications before your next appointment, please call your pharmacy*   Lab Work: If you have labs (blood work) drawn today and your tests are completely normal, you will receive your results only by: Marland Kitchen MyChart Message (if you have MyChart) OR . A paper copy in the mail If you have any lab test that is abnormal or we need to change your treatment, we will call you to review the results.   Testing/Procedures: Your physician has requested that you have an echocardiogram. Echocardiography is a painless test that uses sound waves to create images of your heart. It provides your doctor with information about the size and shape of your heart and how well your heart's chambers and valves are working. This procedure takes approximately one hour. There are no restrictions for this procedure.   Follow-Up: At [facility], you and your health needs are our priority.  As part of our continuing mission to provide you with exceptional heart care, we have created designated Provider Care Teams.  These Care Teams include your primary Cardiologist (physician) and Advanced Practice Providers (APPs -  Physician Assistants and Nurse Practitioners) who all work together to provide you with the care you need, when you need it.  We recommend signing up for the patient portal called "MyChart".  Sign up information is provided on this After Visit Summary.  MyChart is used to connect with patients for Virtual Visits (Telemedicine).  Patients are able to view lab/test results, encounter notes, upcoming appointments, etc.  Non-urgent messages can be sent to your provider as well.   To learn more about what you can do with MyChart, go to [email or web address].    Your next appointment:   5 -6 month(s)  The  format for your next appointment:   In Person  Provider:   You may see Jenkins Rouge, MD or one of the following Advanced Practice Providers on your designated Care Team:    Truitt Merle, NP  Cecilie Kicks, NP  Kathyrn Drown, NP

## 2020-10-15 ENCOUNTER — Other Ambulatory Visit: Payer: Self-pay | Admitting: Cardiovascular Disease

## 2020-10-17 DIAGNOSIS — I10 Essential (primary) hypertension: Secondary | ICD-10-CM | POA: Diagnosis not present

## 2020-10-17 DIAGNOSIS — E7800 Pure hypercholesterolemia, unspecified: Secondary | ICD-10-CM | POA: Diagnosis not present

## 2020-10-17 DIAGNOSIS — E039 Hypothyroidism, unspecified: Secondary | ICD-10-CM | POA: Diagnosis not present

## 2020-10-17 DIAGNOSIS — E1121 Type 2 diabetes mellitus with diabetic nephropathy: Secondary | ICD-10-CM | POA: Diagnosis not present

## 2020-10-17 DIAGNOSIS — E1165 Type 2 diabetes mellitus with hyperglycemia: Secondary | ICD-10-CM | POA: Diagnosis not present

## 2020-11-09 ENCOUNTER — Ambulatory Visit (HOSPITAL_COMMUNITY): Payer: PPO | Attending: Internal Medicine

## 2020-11-09 ENCOUNTER — Other Ambulatory Visit: Payer: Self-pay

## 2020-11-09 DIAGNOSIS — I2510 Atherosclerotic heart disease of native coronary artery without angina pectoris: Secondary | ICD-10-CM

## 2020-11-09 DIAGNOSIS — I739 Peripheral vascular disease, unspecified: Secondary | ICD-10-CM | POA: Diagnosis not present

## 2020-11-09 LAB — ECHOCARDIOGRAM COMPLETE
Area-P 1/2: 3.76 cm2
S' Lateral: 3 cm

## 2021-01-18 DIAGNOSIS — I10 Essential (primary) hypertension: Secondary | ICD-10-CM | POA: Diagnosis not present

## 2021-01-18 DIAGNOSIS — E1165 Type 2 diabetes mellitus with hyperglycemia: Secondary | ICD-10-CM | POA: Diagnosis not present

## 2021-01-23 DIAGNOSIS — E039 Hypothyroidism, unspecified: Secondary | ICD-10-CM | POA: Diagnosis not present

## 2021-01-23 DIAGNOSIS — E7800 Pure hypercholesterolemia, unspecified: Secondary | ICD-10-CM | POA: Diagnosis not present

## 2021-01-23 DIAGNOSIS — Z0001 Encounter for general adult medical examination with abnormal findings: Secondary | ICD-10-CM | POA: Diagnosis not present

## 2021-01-23 DIAGNOSIS — I10 Essential (primary) hypertension: Secondary | ICD-10-CM | POA: Diagnosis not present

## 2021-01-23 DIAGNOSIS — E1121 Type 2 diabetes mellitus with diabetic nephropathy: Secondary | ICD-10-CM | POA: Diagnosis not present

## 2021-01-23 DIAGNOSIS — N1831 Chronic kidney disease, stage 3a: Secondary | ICD-10-CM | POA: Diagnosis not present

## 2021-01-23 DIAGNOSIS — I25118 Atherosclerotic heart disease of native coronary artery with other forms of angina pectoris: Secondary | ICD-10-CM | POA: Diagnosis not present

## 2021-02-08 DIAGNOSIS — E1165 Type 2 diabetes mellitus with hyperglycemia: Secondary | ICD-10-CM | POA: Diagnosis not present

## 2021-02-08 DIAGNOSIS — E039 Hypothyroidism, unspecified: Secondary | ICD-10-CM | POA: Diagnosis not present

## 2021-02-08 DIAGNOSIS — I10 Essential (primary) hypertension: Secondary | ICD-10-CM | POA: Diagnosis not present

## 2021-02-08 DIAGNOSIS — E7800 Pure hypercholesterolemia, unspecified: Secondary | ICD-10-CM | POA: Diagnosis not present

## 2021-02-08 DIAGNOSIS — E1121 Type 2 diabetes mellitus with diabetic nephropathy: Secondary | ICD-10-CM | POA: Diagnosis not present

## 2021-02-09 DIAGNOSIS — I2510 Atherosclerotic heart disease of native coronary artery without angina pectoris: Secondary | ICD-10-CM | POA: Diagnosis not present

## 2021-02-09 DIAGNOSIS — E1165 Type 2 diabetes mellitus with hyperglycemia: Secondary | ICD-10-CM | POA: Diagnosis not present

## 2021-02-09 DIAGNOSIS — E782 Mixed hyperlipidemia: Secondary | ICD-10-CM | POA: Diagnosis not present

## 2021-02-09 DIAGNOSIS — I10 Essential (primary) hypertension: Secondary | ICD-10-CM | POA: Diagnosis not present

## 2021-02-15 DIAGNOSIS — I10 Essential (primary) hypertension: Secondary | ICD-10-CM | POA: Diagnosis not present

## 2021-02-15 DIAGNOSIS — Z6833 Body mass index (BMI) 33.0-33.9, adult: Secondary | ICD-10-CM | POA: Diagnosis not present

## 2021-02-15 DIAGNOSIS — I2510 Atherosclerotic heart disease of native coronary artery without angina pectoris: Secondary | ICD-10-CM | POA: Diagnosis not present

## 2021-02-15 DIAGNOSIS — E039 Hypothyroidism, unspecified: Secondary | ICD-10-CM | POA: Diagnosis not present

## 2021-02-15 DIAGNOSIS — E782 Mixed hyperlipidemia: Secondary | ICD-10-CM | POA: Diagnosis not present

## 2021-02-15 DIAGNOSIS — E1121 Type 2 diabetes mellitus with diabetic nephropathy: Secondary | ICD-10-CM | POA: Diagnosis not present

## 2021-02-22 DIAGNOSIS — N1831 Chronic kidney disease, stage 3a: Secondary | ICD-10-CM | POA: Diagnosis not present

## 2021-02-22 DIAGNOSIS — E7800 Pure hypercholesterolemia, unspecified: Secondary | ICD-10-CM | POA: Diagnosis not present

## 2021-02-22 DIAGNOSIS — E1165 Type 2 diabetes mellitus with hyperglycemia: Secondary | ICD-10-CM | POA: Diagnosis not present

## 2021-03-15 DIAGNOSIS — E1121 Type 2 diabetes mellitus with diabetic nephropathy: Secondary | ICD-10-CM | POA: Diagnosis not present

## 2021-03-15 DIAGNOSIS — E1165 Type 2 diabetes mellitus with hyperglycemia: Secondary | ICD-10-CM | POA: Diagnosis not present

## 2021-03-16 DIAGNOSIS — E1121 Type 2 diabetes mellitus with diabetic nephropathy: Secondary | ICD-10-CM | POA: Diagnosis not present

## 2021-03-24 DIAGNOSIS — E1165 Type 2 diabetes mellitus with hyperglycemia: Secondary | ICD-10-CM | POA: Diagnosis not present

## 2021-03-27 ENCOUNTER — Encounter (INDEPENDENT_AMBULATORY_CARE_PROVIDER_SITE_OTHER): Payer: PPO | Admitting: Ophthalmology

## 2021-04-11 DIAGNOSIS — I10 Essential (primary) hypertension: Secondary | ICD-10-CM | POA: Diagnosis not present

## 2021-04-11 DIAGNOSIS — E1165 Type 2 diabetes mellitus with hyperglycemia: Secondary | ICD-10-CM | POA: Diagnosis not present

## 2021-04-11 DIAGNOSIS — E782 Mixed hyperlipidemia: Secondary | ICD-10-CM | POA: Diagnosis not present

## 2021-04-17 ENCOUNTER — Other Ambulatory Visit: Payer: Self-pay

## 2021-04-17 ENCOUNTER — Encounter (INDEPENDENT_AMBULATORY_CARE_PROVIDER_SITE_OTHER): Payer: Self-pay | Admitting: Ophthalmology

## 2021-04-17 ENCOUNTER — Ambulatory Visit (INDEPENDENT_AMBULATORY_CARE_PROVIDER_SITE_OTHER): Payer: PPO | Admitting: Ophthalmology

## 2021-04-17 DIAGNOSIS — E113293 Type 2 diabetes mellitus with mild nonproliferative diabetic retinopathy without macular edema, bilateral: Secondary | ICD-10-CM | POA: Diagnosis not present

## 2021-04-17 DIAGNOSIS — H43811 Vitreous degeneration, right eye: Secondary | ICD-10-CM | POA: Diagnosis not present

## 2021-04-17 NOTE — Assessment & Plan Note (Signed)
Mild NPDR OU, no progression, safe to proceed with again dilate examination in 1 year

## 2021-04-17 NOTE — Progress Notes (Signed)
04/17/2021     CHIEF COMPLAINT Patient presents for Retina Follow Up (1 year fu OU and OCT/Pt states, "I do not think that my va has really changed any. I have had a lot of dryness in my eyes it seems but other than that, it is good."/A1C: Unknown/LBS: 200/)   HISTORY OF PRESENT ILLNESS: Danielle Murray is a 80 y.o. female who presents to the clinic today for:   HPI    Retina Follow Up    Diagnosis: Other   Laterality: both eyes   Onset: 1 year ago   Severity: mild   Duration: 1 year   Course: stable   Comments: 1 year fu OU and OCT Pt states, "I do not think that my va has really changed any. I have had a lot of dryness in my eyes it seems but other than that, it is good." A1C: Unknown LBS: 200        Last edited by Kendra Opitz, COA on 04/17/2021  8:32 AM. (History)      Referring physician: [name], MD [place],  [place] [id number]  HISTORICAL INFORMATION:   Selected notes from the [facility]    Lab Results  Component Value Date   HGBA1C 9.2 (H) 03/10/2015     CURRENT MEDICATIONS: No current outpatient medications on file. (Ophthalmic Drugs)   No current facility-administered medications for this visit. (Ophthalmic Drugs)   Current Outpatient Medications (Other)  Medication Sig  . amLODipine (NORVASC) 5 MG tablet Take 1 tablet (5 mg total) by mouth daily.  . carvedilol (COREG) 6.25 MG tablet Take 1 tablet (6.25 mg total) by mouth 2 (two) times daily with a meal.  . Cholecalciferol (VITAMIN D3 PO) Take 1 tablet by mouth daily.  . clopidogrel (PLAVIX) 75 MG tablet Take 1 tablet (75 mg total) by mouth daily.  . Dulaglutide (TRULICITY) 0.92 ZR/0.0TM SOPN Inject into the skin once a week.  . furosemide (LASIX) 40 MG tablet Take 1 tablet (40 mg total) by mouth daily as needed for fluid or edema.  . insulin NPH Human (HUMULIN N,NOVOLIN N) 100 UNIT/ML injection Inject 50 Units into the skin once. TAKE 15 UNITS IN THE  MORNING AND 50 UNITS AT NIGHT.  Marland Kitchen isosorbide mononitrate (IMDUR) 30 MG 24 hr tablet Take 1 tablet (30 mg total) by mouth daily.  Marland Kitchen levothyroxine (SYNTHROID, LEVOTHROID) 88 MCG tablet Take 88 mcg by mouth at bedtime.   . metFORMIN (GLUCOPHAGE) 500 MG tablet Take 1 tablet (500 mg total) by mouth daily.  . nitroGLYCERIN (NITROSTAT) 0.4 MG SL tablet Place 1 tablet (0.4 mg total) under the tongue every 5 (five) minutes as needed for chest pain (3 doses max).  . Probiotic Product (PROBIOTIC PO) Take 1 tablet by mouth as needed.   . rosuvastatin (CRESTOR) 5 MG tablet Take 1 tablet (5 mg total) by mouth daily.   No current facility-administered medications for this visit. (Other)      REVIEW OF SYSTEMS:    ALLERGIES Allergies  Allergen Reactions  . Penicillins     Diffuse joint pain @ age 89 in context of Scarlet Fever    PAST MEDICAL HISTORY Past Medical History:  Diagnosis Date  . Arthritis    "fingers, all my joints" (09/03/2013)  . Coronary artery disease    a. 08/2013: unstable angina s/p PTCA/DES to LAD, medical therapy for residual severe stenosis of a mid-distal left PDA branch of large dominant LCx, moderate  RCA disease (consider PCI of L-PDA if she fails med rx).  . DEPRESSION   . Diverticulosis   . Hyperlipidemia   . Hypertension   . HYPOTHYROIDISM   . Preseptal cellulitis of right upper eyelid 03/10/2016  . SVT (supraventricular tachycardia) ([place])    a. very brief transient SVT during 08/2013 admission overnight.  . Type II diabetes mellitus ([place])    Dr Chalmers Cater   Past Surgical History:  Procedure Laterality Date  . ABDOMINAL HYSTERECTOMY  1989   no BSO; dysfunctional menses  . CATARACT EXTRACTION W/ INTRAOCULAR LENS  IMPLANT, BILATERAL Bilateral 2014  . CATARACT EXTRACTION W/PHACO Right 07/02/2013  . CATARACT EXTRACTION W/PHACO Left 06/01/2013  . COLONOSCOPY  2003   Tics  . CORONARY ANGIOPLASTY WITH STENT PLACEMENT  09/03/2013   "1" (09/03/2013)  . LEFT HEART  CATHETERIZATION WITH CORONARY ANGIOGRAM N/A 09/03/2013   Procedure: LEFT HEART CATHETERIZATION WITH CORONARY ANGIOGRAM;  Surgeon: Blane Ohara, MD;  Location: [facility] CATH LAB;  Service: Cardiovascular;  Laterality: N/A;  . TOTAL KNEE ARTHROPLASTY Left 06/2005   Dr Gladstone Lighter    FAMILY HISTORY Family History  Problem Relation Age of Onset  . Asthma Mother   . Hypertension Mother   . Heart failure Mother   . Hypertension Father   . Stroke Father 60  . Heart attack Father 30  . Hypertension Sister   . Hyperlipidemia Sister   . Heart attack Sister        pre 43; Twin  . Diabetes Sister        her TWIN  . Hypertension Brother   . Hyperlipidemia Brother   . Coronary artery disease Brother        stents late 25s  . Breast cancer Neg Hx     SOCIAL HISTORY Social History   Tobacco Use  . Smoking status: Never Smoker  . Smokeless tobacco: Never Used  Vaping Use  . Vaping Use: Never used  Substance Use Topics  . Alcohol use: Yes    Comment: 09/03/2013 "1-2 times/yr"  . Drug use: No         OPHTHALMIC EXAM: Base Eye Exam    Visual Acuity (ETDRS)      Right Left   Dist [place] 20/20 -2 20/25 -1       Tonometry (Tonopen, 8:35 AM)      Right Left   Pressure 12 11       Pupils      Pupils Dark Light Shape React APD   Right PERRL 3 2 Round Brisk None   Left PERRL 3 2 Round Brisk None       Visual Fields (Counting fingers)      Left Right    Full Full       Extraocular Movement      Right Left    Full Full       Neuro/Psych    Oriented x3: Yes   Mood/Affect: Normal       Dilation    Both eyes: 1.0% Mydriacyl, 2.5% Phenylephrine @ 8:36 AM        Slit Lamp and Fundus Exam    External Exam      Right Left   External Normal Normal       Slit Lamp Exam      Right Left   Lids/Lashes Normal Normal   Conjunctiva/Sclera White and quiet White and quiet   Cornea Clear Clear   Anterior Chamber Deep and quiet Deep and quiet  Iris Round and reactive Round and  reactive   Lens Posterior chamber intraocular lens Posterior chamber intraocular lens   Anterior Vitreous Normal Normal       Fundus Exam      Right Left   Posterior Vitreous Posterior vitreous detachment Posterior vitreous detachment   Disc Normal Normal   C/D Ratio 0.4 0.4   Macula Normal Normal   Vessels DR, NPDR- Mild DR, NPDR- Mild   Periphery Normal Normal          IMAGING AND PROCEDURES  Imaging and Procedures for 04/17/21  OCT, Retina - OU - Both Eyes       Right Eye Quality was good. Scan locations included subfoveal. Central Foveal Thickness: 228. Progression has been stable. Findings include normal observations, normal foveal contour.   Left Eye Quality was good. Scan locations included subfoveal. Central Foveal Thickness: 237. Progression has been stable. Findings include normal foveal contour.   Notes Posterior vitreous detachment OU.                ASSESSMENT/PLAN:  Mild nonproliferative diabetic retinopathy of both eyes (HCC) Mild NPDR OU, no progression, safe to proceed with again dilate examination in 1 year  Posterior vitreous detachment of right eye  The nature of posterior vitreous detachment was discussed with the patient as well as its physiology, its age prevalence, and its possible implication regarding retinal breaks and detachment.  An informational brochure was offered to the patient.  All the patient's questions were answered.  The patient was asked to return if new or different flashes or floaters develops.   Patient was instructed to contact office immediately if any new changes were noticed. I explained to the patient that vitreous inside the eye is similar to jello inside a bowl. As the jello melts it can start to pull away from the bowl, similarly the vitreous throughout our lives can begin to pull away from the retina. That process is called a posterior vitreous detachment. In some cases, the vitreous can tug hard enough on the retina  to form a retinal tear. I discussed with the patient the signs and symptoms of a retinal detachment.  Do not rub the eye.        ICD-10-CM   1. Nonproliferative diabetic retinopathy of both eyes (HCC)  E11.3293 OCT, Retina - OU - Both Eyes  2. Mild nonproliferative diabetic retinopathy of both eyes without macular edema associated with type 2 diabetes mellitus ([place])  N17.0017   3. Posterior vitreous detachment of right eye  H43.811     1.  Mild NPDR OU stable over the last year no intervention  2.  Lateral posterior vitreous detachment condition stable  3.  Ophthalmic Meds Ordered this visit:  No orders of the defined types were placed in this encounter.      Return in about 1 year (around 04/17/2022) for DILATE OU, OCT.  There are no Patient Instructions on file for this visit.   Explained the diagnoses, plan, and follow up with the patient and they expressed understanding.  Patient expressed understanding of the importance of proper follow up care.   Clent Demark Denese Mentink M.D. Diseases & Surgery of the Retina and Vitreous [facility]ina [facility] Diabetic [place] 04/17/21     Abbreviations: M myopia (nearsighted); A astigmatism; H hyperopia (farsighted); P presbyopia; Mrx spectacle prescription;  CTL contact lenses; OD right eye; OS left eye; OU both eyes  XT exotropia; ET esotropia; PEK punctate epithelial keratitis; PEE punctate epithelial  erosions; DES dry eye syndrome; MGD meibomian gland dysfunction; ATs artificial tears; PFAT's preservative free artificial tears; Bailey nuclear sclerotic cataract; PSC posterior subcapsular cataract; ERM epi-retinal membrane; PVD posterior vitreous detachment; RD retinal detachment; DM diabetes mellitus; DR diabetic retinopathy; NPDR non-proliferative diabetic retinopathy; PDR proliferative diabetic retinopathy; CSME clinically significant macular edema; DME diabetic macular edema; dbh dot blot hemorrhages; CWS cotton wool spot; POAG primary open angle glaucoma;  C/D cup-to-disc ratio; HVF humphrey visual field; GVF goldmann visual field; OCT optical coherence tomography; IOP intraocular pressure; BRVO Branch retinal vein occlusion; CRVO central retinal vein occlusion; CRAO central retinal artery occlusion; BRAO branch retinal artery occlusion; RT retinal tear; SB scleral buckle; PPV pars plana vitrectomy; VH Vitreous hemorrhage; PRP panretinal laser photocoagulation; IVK intravitreal kenalog; VMT vitreomacular traction; MH Macular hole;  NVD neovascularization of the disc; NVE neovascularization elsewhere; AREDS age related eye disease study; ARMD age related macular degeneration; POAG primary open angle glaucoma; EBMD epithelial/anterior basement membrane dystrophy; ACIOL anterior chamber intraocular lens; IOL intraocular lens; PCIOL posterior chamber intraocular lens; Phaco/IOL phacoemulsification with intraocular lens placement; Gardner photorefractive keratectomy; LASIK laser assisted in situ keratomileusis; HTN hypertension; DM diabetes mellitus; COPD chronic obstructive pulmonary disease

## 2021-04-17 NOTE — Assessment & Plan Note (Signed)

## 2021-04-17 NOTE — Patient Instructions (Signed)
Vitreous Detachment  Vitreous detachment is part of the normal aging process in the eyes. Vitreous is the jelly-like substance that makes up most of the inside of the eyeballs. It helps the eyeballs keep a round shape. The vitreous is attached to the retina of the eye with a series of fibers. As you age, the vitreous gradually shrinks. Tension increases between the fibers and the retina. Eventually, the fibers can break free from the retina, causing vitreous detachment. In most cases, this does not cause problems and does not require treatment. However, it can sometimes cause the retina to separate from the eyeball (retinal detachment), which requires treatment to prevent vision loss. What are the causes? Aging is the main cause of vitreous detachment. Everyone's vitreous naturally shrinks with age. What increases the risk? You are more likely to have vitreous detachment if you:  Are at least 79 years old.  Have inflammation of the eye.  Have an eye injury.  Have had eye surgery.  Have a hemorrhage in your eye.  Are very nearsighted (myopia).  Have diabetes. What are the signs or symptoms? Most people with this condition will not notice any symptoms. If symptoms do occur, the most common are floaters. Floaters occur as the vitreous begins to shrink. They may:  Appear as tiny dots or webs in your vision.  Seem to disappear when you look at them directly.  Appear more often as your condition gets worse. Other symptoms include:  Flashes of light (photopsia) in your peripheral vision that may look like lightning streaks.  Decreased vision or a dark curtain or shadow moving across your field of vision. This is rare. How is this diagnosed? This condition may be diagnosed based on:  Your signs and symptoms.  An exam by a health care provider who specializes in conditions and diseases of the eye (ophthalmologist). The exam may include: ? Putting eye drops in your eye to make the  pupil wider (dilated). The pupil is the opening in the center of the eye. ? Checking the pupils with a magnifying glass. This exam is the best way to determine the type and extent of damage to your eye. How is this treated? For most people, a vitreous detachment is harmless, causing no symptoms or vision loss, and does not require treatment. Floaters usually become less noticeable over time. If the condition causes retinal detachment, you may need eye surgery to reattach your retina (reattachment surgery) in order to prevent vision loss or restore your vision. Follow these instructions at home:  Keep all follow-up visits as told by your health care provider. This is important. Get help right away if:  You develop signs of retinal detachment. These include: ? A sudden increase in the number of floaters you see. ? An increase in the number of flashes of light you see in your peripheral vision. ? Decreased vision. Summary  Vitreous detachment is part of the normal aging process in the eyes.  Vitreous is the jelly-like substance inside the eyeballs. As you age, the vitreous shrinks, and the fibers that attach the vitreous to the retina can break free, causing vitreous detachment.  In most cases, vitreous detachment does not cause symptoms and does not require treatment. The most common symptom that can occur is seeing floaters that appear as tiny dots or webs in your vision.  Vitreous detachment can sometimes cause the retina to separate from the eyeball (retinal detachment). This must be treated to prevent vision loss. This information is not  intended to replace advice given to you by your health care provider. Make sure you discuss any questions you have with your health care provider. Document Revised: 06/30/2020 Document Reviewed: 06/30/2020 Elsevier Patient Education  [place]. Diabetic Retinopathy  Diabetic retinopathy is a disease of the retina. The retina is a  light-sensitive membrane at the back of the eye. Retinopathy is a complication of diabetes and a common cause of bad eyesight. It can eventually cause blindness. Early detection and treatment of diabetic retinopathy is important in keeping your eyes healthy and preventing further damage to them. What are the causes? Diabetic retinopathy is caused by blood sugar (glucose) levels that are too high for a long period of time. Blood glucose levels that are too high for a long time can:  Damage small blood vessels in the retina, allowing blood to leak through the vessel walls.  Cause new, abnormal blood vessels to grow on the retina. This can scar the retina in the advanced stage of diabetic retinopathy. What increases the risk? You are more likely to develop this condition if:  You have had diabetes for a long time.  You have poorly controlled blood glucose.  You have high blood pressure. What are the signs or symptoms? In the early stages of diabetic retinopathy, there are often no symptoms. As the condition gets worse, symptoms may include:  Blurred vision. This is usually caused by swelling due to abnormal blood glucose levels. The blurriness may go away when blood glucose levels return to normal.  Moving specks or dark spots (floaters) in your vision. These can be caused by a small amount of bleeding (hemorrhage) from retinal blood vessels.  Missing parts of your field of vision, such as vision at the sides of the eyes. This can be caused by larger retinal hemorrhages.  Difficulty reading.  Double vision.  Pain in one or both eyes.  Feeling pressure in one or both eyes.  Trouble seeing straight lines. Straight lines may not look straight.  Redness of the eyes that does not go away. How is this diagnosed? This condition may be diagnosed with an eye exam. For this exam, your eye specialist puts drops in your eyes that enlarge your pupils. The retina is then checked for changes in its  blood vessels. How is this treated? This condition may be treated by:  Keeping your blood glucose and blood pressure within a target range.  Using a type of laser beam to seal your retinal blood vessels. This stops them from bleeding and decreases pressure in your eye.  Getting shots of medicine in the eye to reduce swelling of the center of the retina (macula). You may be given: ? Anti-VEGF medicine. This medicine can help slow vision loss, and may even improve vision. ? Steroid medicine. Follow these instructions at home:  Follow your diabetes management plan as directed by your health care provider. This may include exercising regularly and eating a healthy diet.  Keep your blood glucose level and your blood pressure in your target range. Your health care provider will tell you what your target is.  Check your blood glucose as often as directed.  Take over the counter and prescription medicines only as told by your health care provider. This includes insulin and oral diabetes medicine.  Get your eyes checked at least once every year. An eye specialist can usually see diabetic retinopathy developing long before it starts to cause problems. In many cases, it can be treated to prevent  problems from starting in the first place.  Do not use any products that contain nicotine or tobacco, such as cigarettes, e-cigarettes, and chewing tobacco. If you need help quitting, ask your health care provider.  Keep all follow-up visits. This is important. Contact a health care provider if:  You notice gradual blurring or other changes in your vision over time.  You notice that your glasses or contact lenses do not make things look as sharp as they once did.  You have trouble reading or seeing details at a distance with either eye.  You notice a change in your vision or notice that parts of your field of vision appear missing or hazy.  You suddenly see moving specks or dark spots in the field of  vision of either eye. Get help right away if:  You have sudden pain or pressure in one or both eyes.  You suddenly lose vision or a curtain or veil seems to come across your eyes.  You have a sudden burst of floaters in your vision. Summary  Diabetic retinopathy is a disease of the retina. The retina is a light-sensitive membrane at the back of the eye. Retinopathy is a complication of diabetes.  Get your eyes checked at least once every year. An eye specialist can usually see diabetic retinopathy developing long before it starts to cause problems. In many cases, it can be treated to prevent problems from starting in the first place.  Keep your blood glucose and your blood pressure in your target range. Follow your diabetes management plan as directed by your health care provider.  Get help right away if you have sudden pain or sudden pressure in one or both eyes. Also, get help if you have a sudden burst of floaters in your vision. This information is not intended to replace advice given to you by your health care provider. Make sure you discuss any questions you have with your health care provider. Document Revised: 03/10/2020 Document Reviewed: 03/10/2020 Elsevier Patient Education  [place].

## 2021-04-25 NOTE — Progress Notes (Signed)
Patient ID: Danielle Murray, female   DOB: Mar 15, 1941, 80 y.o.   MRN: [id number]      80 y.o. f/u CAD, HTN, HLD, IDDM.  08/2013 stent to LAD and medical Rx for small left sided PDA. Low risk myovue in June 2018 She is a twin and her sister died in 90.  Sees Balin for her DM. No angina. Some LE edema dependant  Myovue 04/12/17 reviewed EF 68% small area apical ischemia  TTE 11/09/20 EF 99-24% grade 2 diastolic no valve disese   Leg cramps better on crestor rather than lipitor   Seen by Dr [name] in October 2021 stable although she indicated previously she wanted to f/u with someone else ABI 01/14/20 right 0.96 and left 0.83   Lots of somatic complaints Dyspnea with exertion and poor stamina   Doing well Eats sweets on weekends Insulin changed by Balin A1c too high  ROS: Denies fever, malais, weight loss, blurry vision, decreased visual acuity, cough, sputum, SOB, hemoptysis, pleuritic pain, palpitaitons, heartburn, abdominal pain, melena, lower extremity edema, claudication, or rash.  All other systems reviewed and negative  General: BP (!) 150/62   Pulse 69   Ht 5\' 4"  (1.626 m)   Wt 86.2 kg   SpO2 96%   BMI 32.61 kg/m  Affect appropriate Healthy:  appears stated age HEENT: normal Neck supple with no adenopathy JVP normal no bruits no thyromegaly Lungs clear with no wheezing and good diaphragmatic motion Heart:  S1/S2 no murmur, no rub, gallop or click PMI normal Abdomen: benighn, BS positve, no tenderness, no AAA no bruit.  No HSM or HJR Distal pulses intact with no bruits Trace bilateral edema Neuro non-focal Skin warm and dry No muscular weakness   Current Outpatient Medications  Medication Sig Dispense Refill   amLODipine (NORVASC) 5 MG tablet Take 1 tablet (5 mg total) by mouth daily. 90 tablet 3   carvedilol (COREG) 6.25 MG tablet Take 1 tablet (6.25 mg total) by mouth 2 (two) times daily with a meal. 180 tablet 3   Cholecalciferol (VITAMIN D3 PO) Take 1 tablet by  mouth daily.     clopidogrel (PLAVIX) 75 MG tablet Take 1 tablet (75 mg total) by mouth daily. 90 tablet 3   Dulaglutide (TRULICITY) 2.68 TM/1.9QQ SOPN Inject into the skin once a week.     furosemide (LASIX) 40 MG tablet Take 1 tablet (40 mg total) by mouth daily as needed for fluid or edema. 90 tablet 3   isosorbide mononitrate (IMDUR) 30 MG 24 hr tablet Take 1 tablet (30 mg total) by mouth daily. 90 tablet 3   levothyroxine (SYNTHROID, LEVOTHROID) 88 MCG tablet Take 88 mcg by mouth at bedtime.      nitroGLYCERIN (NITROSTAT) 0.4 MG SL tablet Place 1 tablet (0.4 mg total) under the tongue every 5 (five) minutes as needed for chest pain (3 doses max). 25 tablet 3   NOVOLIN 70/30 FLEXPEN (70-30) 100 UNIT/ML KwikPen Inject 45 Units into the skin in the morning and at bedtime.     Probiotic Product (PROBIOTIC PO) Take 1 tablet by mouth as needed.      rosuvastatin (CRESTOR) 5 MG tablet Take 1 tablet (5 mg total) by mouth daily. 90 tablet 3   No current facility-administered medications for this visit.    Allergies  Penicillins  Electrocardiogram:  09/16/19  SR rate 61 low voltage no changes 05/02/2021 SR rate 67 LAD low voltage no acute changes   Assessment and Plan CAD: low risk  myovue 05/02/17 small area of ischemia nitrates added continue medical Rx  DM: Discussed low carb diet.  Target hemoglobin A1c is 6.5 or less.  Continue current medications. F/U with [facility]in to adjust insulin Sees Dr Zadie Rhine for diabetic eye exams  HTN: Well controlled.  Continue current medications and low sodium Dash type diet.   Thyroid  Continue replacement sees Balin for labs  Edema:  Continue diuretics low sodium diet and elevation with support hose  Cholesterol:  Continue statin labs with primary  PVD:  ABI reduced on left 0.83  March 2021 f/u Dr Myrene Galas no claudication no need to repeat ABIs now Dyspnea:  TTE 11/09/20 EF 97-91% diastolic dysfunction no significant valve disease continue diuretic nitrates and  Rx for DM and HTN    F/u  Dr [name] and me in a year   Jenkins Rouge

## 2021-05-02 ENCOUNTER — Other Ambulatory Visit: Payer: Self-pay

## 2021-05-02 ENCOUNTER — Encounter: Payer: Self-pay | Admitting: Cardiovascular Disease

## 2021-05-02 ENCOUNTER — Ambulatory Visit: Payer: PPO | Admitting: Cardiovascular Disease

## 2021-05-02 VITALS — BP 150/62 | HR 69 | Ht 64.0 in | Wt 190.0 lb

## 2021-05-02 DIAGNOSIS — I2510 Atherosclerotic heart disease of native coronary artery without angina pectoris: Secondary | ICD-10-CM

## 2021-05-02 DIAGNOSIS — I10 Essential (primary) hypertension: Secondary | ICD-10-CM

## 2021-05-02 DIAGNOSIS — R600 Localized edema: Secondary | ICD-10-CM

## 2021-05-02 DIAGNOSIS — R0600 Dyspnea, unspecified: Secondary | ICD-10-CM

## 2021-05-02 DIAGNOSIS — R0609 Other forms of dyspnea: Secondary | ICD-10-CM

## 2021-05-02 NOTE — Patient Instructions (Signed)
Medication Instructions:  *If you need a refill on your cardiac medications before your next appointment, please call your pharmacy*   Lab Work: If you have labs (blood work) drawn today and your tests are completely normal, you will receive your results only by: [place] (if you have MyChart) OR A paper copy in the mail If you have any lab test that is abnormal or we need to change your treatment, we will call you to review the results.   Follow-Up: At [facility], you and your health needs are our priority.  As part of our continuing mission to provide you with exceptional heart care, we have created designated Provider Care Teams.  These Care Teams include your primary Cardiologist (physician) and Advanced Practice Providers (APPs -  Physician Assistants and Nurse Practitioners) who all work together to provide you with the care you need, when you need it.  We recommend signing up for the patient portal called "MyChart".  Sign up information is provided on this After Visit Summary.  MyChart is used to connect with patients for Virtual Visits (Telemedicine).  Patients are able to view lab/test results, encounter notes, upcoming appointments, etc.  Non-urgent messages can be sent to your provider as well.   To learn more about what you can do with MyChart, go to [email or web address].    Your next appointment:   12 month(s)  The format for your next appointment:   In Person  Provider:   You may see Jenkins Rouge, MD or one of the following Advanced Practice Providers on your designated Care Team:   Kathyrn Drown, NP

## 2021-05-11 DIAGNOSIS — I2510 Atherosclerotic heart disease of native coronary artery without angina pectoris: Secondary | ICD-10-CM | POA: Diagnosis not present

## 2021-05-11 DIAGNOSIS — E1165 Type 2 diabetes mellitus with hyperglycemia: Secondary | ICD-10-CM | POA: Diagnosis not present

## 2021-05-11 DIAGNOSIS — E782 Mixed hyperlipidemia: Secondary | ICD-10-CM | POA: Diagnosis not present

## 2021-05-11 DIAGNOSIS — I10 Essential (primary) hypertension: Secondary | ICD-10-CM | POA: Diagnosis not present

## 2021-06-27 DIAGNOSIS — E1165 Type 2 diabetes mellitus with hyperglycemia: Secondary | ICD-10-CM | POA: Diagnosis not present

## 2021-07-12 DIAGNOSIS — I10 Essential (primary) hypertension: Secondary | ICD-10-CM | POA: Diagnosis not present

## 2021-07-12 DIAGNOSIS — I2510 Atherosclerotic heart disease of native coronary artery without angina pectoris: Secondary | ICD-10-CM | POA: Diagnosis not present

## 2021-07-12 DIAGNOSIS — E1165 Type 2 diabetes mellitus with hyperglycemia: Secondary | ICD-10-CM | POA: Diagnosis not present

## 2021-07-12 DIAGNOSIS — E782 Mixed hyperlipidemia: Secondary | ICD-10-CM | POA: Diagnosis not present

## 2021-09-14 DIAGNOSIS — D225 Melanocytic nevi of trunk: Secondary | ICD-10-CM | POA: Diagnosis not present

## 2021-09-14 DIAGNOSIS — L570 Actinic keratosis: Secondary | ICD-10-CM | POA: Diagnosis not present

## 2021-09-14 DIAGNOSIS — L814 Other melanin hyperpigmentation: Secondary | ICD-10-CM | POA: Diagnosis not present

## 2021-09-14 DIAGNOSIS — L820 Inflamed seborrheic keratosis: Secondary | ICD-10-CM | POA: Diagnosis not present

## 2021-09-14 DIAGNOSIS — L821 Other seborrheic keratosis: Secondary | ICD-10-CM | POA: Diagnosis not present

## 2021-10-11 DIAGNOSIS — E7800 Pure hypercholesterolemia, unspecified: Secondary | ICD-10-CM | POA: Diagnosis not present

## 2021-10-11 DIAGNOSIS — E1165 Type 2 diabetes mellitus with hyperglycemia: Secondary | ICD-10-CM | POA: Diagnosis not present

## 2021-10-11 DIAGNOSIS — E039 Hypothyroidism, unspecified: Secondary | ICD-10-CM | POA: Diagnosis not present

## 2021-10-11 DIAGNOSIS — I10 Essential (primary) hypertension: Secondary | ICD-10-CM | POA: Diagnosis not present

## 2021-10-11 DIAGNOSIS — I2510 Atherosclerotic heart disease of native coronary artery without angina pectoris: Secondary | ICD-10-CM | POA: Diagnosis not present

## 2021-11-11 ENCOUNTER — Other Ambulatory Visit: Payer: Self-pay | Admitting: Cardiovascular Disease

## 2021-11-27 ENCOUNTER — Other Ambulatory Visit: Payer: Self-pay | Admitting: Cardiovascular Disease

## 2022-02-08 DIAGNOSIS — E7800 Pure hypercholesterolemia, unspecified: Secondary | ICD-10-CM | POA: Diagnosis not present

## 2022-02-08 DIAGNOSIS — R8289 Other abnormal findings on cytological and histological examination of urine: Secondary | ICD-10-CM | POA: Diagnosis not present

## 2022-02-08 DIAGNOSIS — E1165 Type 2 diabetes mellitus with hyperglycemia: Secondary | ICD-10-CM | POA: Diagnosis not present

## 2022-02-08 DIAGNOSIS — E039 Hypothyroidism, unspecified: Secondary | ICD-10-CM | POA: Diagnosis not present

## 2022-02-08 DIAGNOSIS — E1121 Type 2 diabetes mellitus with diabetic nephropathy: Secondary | ICD-10-CM | POA: Diagnosis not present

## 2022-02-08 DIAGNOSIS — I10 Essential (primary) hypertension: Secondary | ICD-10-CM | POA: Diagnosis not present

## 2022-02-22 ENCOUNTER — Other Ambulatory Visit: Payer: Self-pay | Admitting: Cardiovascular Disease

## 2022-04-18 DIAGNOSIS — I10 Essential (primary) hypertension: Secondary | ICD-10-CM | POA: Diagnosis not present

## 2022-04-18 DIAGNOSIS — E7801 Familial hypercholesterolemia: Secondary | ICD-10-CM | POA: Diagnosis not present

## 2022-04-18 DIAGNOSIS — E1165 Type 2 diabetes mellitus with hyperglycemia: Secondary | ICD-10-CM | POA: Diagnosis not present

## 2022-04-23 ENCOUNTER — Ambulatory Visit (INDEPENDENT_AMBULATORY_CARE_PROVIDER_SITE_OTHER): Payer: PPO | Admitting: Ophthalmology

## 2022-04-23 ENCOUNTER — Encounter (INDEPENDENT_AMBULATORY_CARE_PROVIDER_SITE_OTHER): Payer: Self-pay | Admitting: Ophthalmology

## 2022-04-23 DIAGNOSIS — H353131 Nonexudative age-related macular degeneration, bilateral, early dry stage: Secondary | ICD-10-CM | POA: Insufficient documentation

## 2022-04-23 DIAGNOSIS — E113293 Type 2 diabetes mellitus with mild nonproliferative diabetic retinopathy without macular edema, bilateral: Secondary | ICD-10-CM | POA: Diagnosis not present

## 2022-04-23 DIAGNOSIS — H43811 Vitreous degeneration, right eye: Secondary | ICD-10-CM

## 2022-04-23 NOTE — Assessment & Plan Note (Signed)
The nature of mild nonproliferative diabetic retinopathy was discussed with the patient. Emphasis was placed on tight glucose, blood pressure, and serum lipid control. Avoidance of smoking was emphasized. Maintenance of normal body weight was emphasized. Appropriate follow up dilated exam is 1 year. 

## 2022-04-23 NOTE — Assessment & Plan Note (Signed)

## 2022-04-23 NOTE — Progress Notes (Signed)
04/23/2022     CHIEF COMPLAINT Patient presents for  Chief Complaint  Patient presents with   Retina Follow Up      HISTORY OF PRESENT ILLNESS: Danielle Murray is a 81 y.o. female who presents to the clinic today for:   HPI     Retina Follow Up           Diagnosis: Diabetic Retinopathy   Laterality: both eyes   Onset: 1         Comments   1 yr fu OU OCT. Patient states "I have to wear my glasses more often to see and to read." LBS: 156 this morning. Patient reports last A1C was an 8.0. Patient denies hospitalizations or surgeries since last visit.      Last edited by Laurin Coder on 04/23/2022  8:00 AM.      Referring physician: [name], MD [place],  [place] [address]  HISTORICAL INFORMATION:   Selected notes from the MEDICAL RECORD NUMBER    Lab Results  Component Value Date   HGBA1C 9.2 (H) 03/10/2015     CURRENT MEDICATIONS: No current outpatient medications on file. (Ophthalmic Drugs)   No current facility-administered medications for this visit. (Ophthalmic Drugs)   Current Outpatient Medications (Other)  Medication Sig   amLODipine (NORVASC) 5 MG tablet Take 1 tablet by mouth once daily   carvedilol (COREG) 6.25 MG tablet TAKE 1 TABLET BY MOUTH TWICE DAILY WITH A MEAL   Cholecalciferol (VITAMIN D3 PO) Take 1 tablet by mouth daily.   clopidogrel (PLAVIX) 75 MG tablet Take 1 tablet by mouth once daily   Dulaglutide (TRULICITY) 5.73 UK/0.2RK SOPN Inject into the skin once a week.   furosemide (LASIX) 40 MG tablet Take 1 tablet (40 mg total) by mouth daily as needed for fluid or edema.   isosorbide mononitrate (IMDUR) 30 MG 24 hr tablet Take 1 tablet by mouth once daily   levothyroxine (SYNTHROID, LEVOTHROID) 88 MCG tablet Take 88 mcg by mouth at bedtime.    nitroGLYCERIN (NITROSTAT) 0.4 MG SL tablet Place 1 tablet (0.4 mg total) under the tongue every 5 (five) minutes as needed for chest pain (3 doses max).    NOVOLIN 70/30 FLEXPEN (70-30) 100 UNIT/ML KwikPen Inject 45 Units into the skin in the morning and at bedtime.   Probiotic Product (PROBIOTIC PO) Take 1 tablet by mouth as needed.    rosuvastatin (CRESTOR) 5 MG tablet Take 1 tablet (5 mg total) by mouth daily.   No current facility-administered medications for this visit. (Other)      REVIEW OF SYSTEMS: ROS   Negative for: Constitutional, Gastrointestinal, Neurological, Skin, Genitourinary, Musculoskeletal, HENT, Endocrine, Cardiovascular, Eyes, Respiratory, Psychiatric, Allergic/Imm, Heme/Lymph Last edited by Hurman Horn, MD on 04/23/2022  8:30 AM.       ALLERGIES Allergies  Allergen Reactions   Penicillins     Diffuse joint pain @ age 43 in context of Scarlet Fever    PAST MEDICAL HISTORY Past Medical History:  Diagnosis Date   Arthritis    "fingers, all my joints" (09/03/2013)   Coronary artery disease    a. 08/2013: unstable angina s/p PTCA/DES to LAD, medical therapy for residual severe stenosis of a mid-distal left PDA branch of large dominant LCx, moderate RCA disease (consider PCI of L-PDA if she fails med rx).   DEPRESSION    Diverticulosis    Hyperlipidemia    Hypertension    HYPOTHYROIDISM  Preseptal cellulitis of right upper eyelid 03/10/2016   SVT (supraventricular tachycardia) ([place])    a. very brief transient SVT during 08/2013 admission overnight.   Type II diabetes mellitus (Danielle Murray)    Dr Chalmers Cater   Past Surgical History:  Procedure Laterality Date   ABDOMINAL HYSTERECTOMY  1989   no BSO; dysfunctional menses   CATARACT EXTRACTION W/ INTRAOCULAR LENS  IMPLANT, BILATERAL Bilateral 2014   CATARACT EXTRACTION W/PHACO Right 07/02/2013   CATARACT EXTRACTION W/PHACO Left 06/01/2013   COLONOSCOPY  2003   Tics   CORONARY ANGIOPLASTY WITH STENT PLACEMENT  09/03/2013   "1" (09/03/2013)   LEFT HEART CATHETERIZATION WITH CORONARY ANGIOGRAM N/A 09/03/2013   Procedure: LEFT HEART CATHETERIZATION WITH CORONARY  ANGIOGRAM;  Surgeon: Blane Ohara, MD;  Location: [facility] CATH LAB;  Service: Cardiovascular;  Laterality: N/A;   TOTAL KNEE ARTHROPLASTY Left 06/2005   Dr Gladstone Lighter    FAMILY HISTORY Family History  Problem Relation Age of Onset   Asthma Mother    Hypertension Mother    Heart failure Mother    Hypertension Father    Stroke Father 32   Heart attack Father 13   Hypertension Sister    Hyperlipidemia Sister    Heart attack Sister        pre 8; Twin   Diabetes Sister        her TWIN   Hypertension Brother    Hyperlipidemia Brother    Coronary artery disease Brother        stents late 62s   Breast cancer Neg Hx     SOCIAL HISTORY Social History   Tobacco Use   Smoking status: Never   Smokeless tobacco: Never  Vaping Use   Vaping Use: Never used  Substance Use Topics   Alcohol use: Yes    Comment: 09/03/2013 "1-2 times/yr"   Drug use: No         OPHTHALMIC EXAM:  Base Eye Exam     Visual Acuity (ETDRS)       Right Left   Dist [place] 20/20 20/30 +2   Dist ph [place]  NI         Tonometry (Tonopen, 8:01 AM)       Right Left   Pressure 13 15         Pupils       Pupils Dark Light APD   Right PERRL 3 2 None   Left PERRL 3 2 None         Visual Fields (Counting fingers)       Left Right    Full Full         Extraocular Movement       Right Left    Full, Ortho Full, Ortho         Neuro/Psych     Oriented x3: Yes   Mood/Affect: Normal         Dilation     Both eyes: 1.0% Mydriacyl, 2.5% Phenylephrine @ 8:01 AM           Slit Lamp and Fundus Exam     External Exam       Right Left   External Normal Normal         Slit Lamp Exam       Right Left   Lids/Lashes Normal Normal   Conjunctiva/Sclera White and quiet White and quiet   Cornea Clear Clear   Anterior Chamber Deep and quiet Deep and quiet   Iris Round and  reactive Round and reactive   Lens Posterior chamber intraocular lens Posterior chamber intraocular lens    Anterior Vitreous Normal Normal         Fundus Exam       Right Left   Posterior Vitreous Posterior vitreous detachment Posterior vitreous detachment   Disc Normal Normal   C/D Ratio 0.4 0.4   Macula Normal Normal   Vessels DR, NPDR- Mild DR, NPDR- Mild   Periphery Normal Normal            IMAGING AND PROCEDURES  Imaging and Procedures for 04/23/22  OCT, Retina - OU - Both Eyes       Right Eye Quality was good. Scan locations included subfoveal. Central Foveal Thickness: 229. Progression has been stable. Findings include normal observations, normal foveal contour, retinal drusen .   Left Eye Quality was good. Scan locations included subfoveal. Central Foveal Thickness: 249. Progression has been stable. Findings include normal foveal contour, retinal drusen .   Notes Posterior vitreous detachment OU.  With no active maculopathy OU              ASSESSMENT/PLAN:  Mild nonproliferative diabetic retinopathy of both eyes (HCC) The nature of mild nonproliferative diabetic retinopathy was discussed with the patient. Emphasis was placed on tight glucose, blood pressure, and serum lipid control. Avoidance of smoking was emphasized. Maintenance of normal body weight was emphasized. Appropriate follow up dilated exam is 1 year.  Posterior vitreous detachment of right eye  The nature of posterior vitreous detachment was discussed with the patient as well as its physiology, its age prevalence, and its possible implication regarding retinal breaks and detachment.  An informational brochure was offered to the patient.  All the patient's questions were answered.  The patient was asked to return if new or different flashes or floaters develops.   Patient was instructed to contact office immediately if any new changes were noticed. I explained to the patient that vitreous inside the eye is similar to jello inside a bowl. As the jello melts it can start to pull away from the bowl,  similarly the vitreous throughout our lives can begin to pull away from the retina. That process is called a posterior vitreous detachment. In some cases, the vitreous can tug hard enough on the retina to form a retinal tear. I discussed with the patient the signs and symptoms of a retinal detachment.  Do not rub the eye.    Early stage nonexudative age-related macular degeneration of both eyes Mild no high risk for progression observe     ICD-10-CM   1. Nonproliferative diabetic retinopathy of both eyes (HCC)  E11.3293 OCT, Retina - OU - Both Eyes    2. Mild nonproliferative diabetic retinopathy of both eyes without macular edema associated with type 2 diabetes mellitus ([place])  V37.4827     3. Posterior vitreous detachment of right eye  H43.811     4. Early stage nonexudative age-related macular degeneration of both eyes  H35.3131       1.  No active maculopathy OU. 2.  Mild NPDR remained stable.

## 2022-04-23 NOTE — Assessment & Plan Note (Signed)
Mild no high risk for progression observe

## 2022-04-23 NOTE — Patient Instructions (Signed)
Follow up in 1 year.

## 2022-04-25 DIAGNOSIS — E1121 Type 2 diabetes mellitus with diabetic nephropathy: Secondary | ICD-10-CM | POA: Diagnosis not present

## 2022-04-25 DIAGNOSIS — I10 Essential (primary) hypertension: Secondary | ICD-10-CM | POA: Diagnosis not present

## 2022-04-25 DIAGNOSIS — Z0000 Encounter for general adult medical examination without abnormal findings: Secondary | ICD-10-CM | POA: Diagnosis not present

## 2022-04-25 DIAGNOSIS — E039 Hypothyroidism, unspecified: Secondary | ICD-10-CM | POA: Diagnosis not present

## 2022-04-25 DIAGNOSIS — R600 Localized edema: Secondary | ICD-10-CM | POA: Diagnosis not present

## 2022-04-25 DIAGNOSIS — M542 Cervicalgia: Secondary | ICD-10-CM | POA: Diagnosis not present

## 2022-04-25 DIAGNOSIS — I739 Peripheral vascular disease, unspecified: Secondary | ICD-10-CM | POA: Diagnosis not present

## 2022-04-25 DIAGNOSIS — N1831 Chronic kidney disease, stage 3a: Secondary | ICD-10-CM | POA: Diagnosis not present

## 2022-04-25 DIAGNOSIS — I25118 Atherosclerotic heart disease of native coronary artery with other forms of angina pectoris: Secondary | ICD-10-CM | POA: Diagnosis not present

## 2022-04-30 ENCOUNTER — Other Ambulatory Visit: Payer: Self-pay | Admitting: Cardiovascular Disease

## 2022-05-02 DIAGNOSIS — E1121 Type 2 diabetes mellitus with diabetic nephropathy: Secondary | ICD-10-CM | POA: Diagnosis not present

## 2022-05-02 DIAGNOSIS — N1831 Chronic kidney disease, stage 3a: Secondary | ICD-10-CM | POA: Diagnosis not present

## 2022-05-02 DIAGNOSIS — I10 Essential (primary) hypertension: Secondary | ICD-10-CM | POA: Diagnosis not present

## 2022-05-04 ENCOUNTER — Other Ambulatory Visit: Payer: Self-pay | Admitting: Cardiovascular Disease

## 2022-05-10 DIAGNOSIS — I25118 Atherosclerotic heart disease of native coronary artery with other forms of angina pectoris: Secondary | ICD-10-CM | POA: Diagnosis not present

## 2022-05-10 DIAGNOSIS — I10 Essential (primary) hypertension: Secondary | ICD-10-CM | POA: Diagnosis not present

## 2022-05-10 DIAGNOSIS — E1121 Type 2 diabetes mellitus with diabetic nephropathy: Secondary | ICD-10-CM | POA: Diagnosis not present

## 2022-06-01 ENCOUNTER — Emergency Department (HOSPITAL_COMMUNITY): Payer: PPO

## 2022-06-01 ENCOUNTER — Encounter (HOSPITAL_COMMUNITY): Payer: Self-pay | Admitting: *Deleted

## 2022-06-01 ENCOUNTER — Inpatient Hospital Stay (HOSPITAL_COMMUNITY)
Admission: EM | Admit: 2022-06-01 | Discharge: 2022-06-05 | DRG: 481 | Disposition: A | Discharge status: Skilled Nursing Facility | Payer: PPO | Attending: Family Medicine | Admitting: Family Medicine

## 2022-06-01 ENCOUNTER — Other Ambulatory Visit: Payer: Self-pay

## 2022-06-01 DIAGNOSIS — Z7902 Long term (current) use of antithrombotics/antiplatelets: Secondary | ICD-10-CM | POA: Diagnosis not present

## 2022-06-01 DIAGNOSIS — E669 Obesity, unspecified: Secondary | ICD-10-CM | POA: Diagnosis not present

## 2022-06-01 DIAGNOSIS — R001 Bradycardia, unspecified: Secondary | ICD-10-CM | POA: Diagnosis not present

## 2022-06-01 DIAGNOSIS — R41841 Cognitive communication deficit: Secondary | ICD-10-CM | POA: Diagnosis not present

## 2022-06-01 DIAGNOSIS — N1831 Chronic kidney disease, stage 3a: Secondary | ICD-10-CM | POA: Diagnosis not present

## 2022-06-01 DIAGNOSIS — I110 Hypertensive heart disease with heart failure: Secondary | ICD-10-CM | POA: Diagnosis not present

## 2022-06-01 DIAGNOSIS — E039 Hypothyroidism, unspecified: Secondary | ICD-10-CM | POA: Diagnosis not present

## 2022-06-01 DIAGNOSIS — I130 Hypertensive heart and chronic kidney disease with heart failure and stage 1 through stage 4 chronic kidney disease, or unspecified chronic kidney disease: Secondary | ICD-10-CM | POA: Diagnosis not present

## 2022-06-01 DIAGNOSIS — Z90710 Acquired absence of both cervix and uterus: Secondary | ICD-10-CM | POA: Diagnosis not present

## 2022-06-01 DIAGNOSIS — Z794 Long term (current) use of insulin: Secondary | ICD-10-CM

## 2022-06-01 DIAGNOSIS — Z825 Family history of asthma and other chronic lower respiratory diseases: Secondary | ICD-10-CM | POA: Diagnosis not present

## 2022-06-01 DIAGNOSIS — Z7389 Other problems related to life management difficulty: Secondary | ICD-10-CM | POA: Diagnosis not present

## 2022-06-01 DIAGNOSIS — E1151 Type 2 diabetes mellitus with diabetic peripheral angiopathy without gangrene: Secondary | ICD-10-CM | POA: Diagnosis not present

## 2022-06-01 DIAGNOSIS — Z880 Allergy status to penicillin: Secondary | ICD-10-CM

## 2022-06-01 DIAGNOSIS — M25552 Pain in left hip: Secondary | ICD-10-CM | POA: Diagnosis not present

## 2022-06-01 DIAGNOSIS — E1165 Type 2 diabetes mellitus with hyperglycemia: Secondary | ICD-10-CM | POA: Diagnosis not present

## 2022-06-01 DIAGNOSIS — E038 Other specified hypothyroidism: Secondary | ICD-10-CM

## 2022-06-01 DIAGNOSIS — Z955 Presence of coronary angioplasty implant and graft: Secondary | ICD-10-CM | POA: Diagnosis not present

## 2022-06-01 DIAGNOSIS — I5032 Chronic diastolic (congestive) heart failure: Secondary | ICD-10-CM | POA: Diagnosis present

## 2022-06-01 DIAGNOSIS — R29898 Other symptoms and signs involving the musculoskeletal system: Secondary | ICD-10-CM | POA: Diagnosis not present

## 2022-06-01 DIAGNOSIS — Z8249 Family history of ischemic heart disease and other diseases of the circulatory system: Secondary | ICD-10-CM | POA: Diagnosis not present

## 2022-06-01 DIAGNOSIS — Z823 Family history of stroke: Secondary | ICD-10-CM | POA: Diagnosis not present

## 2022-06-01 DIAGNOSIS — M6281 Muscle weakness (generalized): Secondary | ICD-10-CM | POA: Diagnosis not present

## 2022-06-01 DIAGNOSIS — Z79899 Other long term (current) drug therapy: Secondary | ICD-10-CM | POA: Diagnosis not present

## 2022-06-01 DIAGNOSIS — S72142A Displaced intertrochanteric fracture of left femur, initial encounter for closed fracture: Secondary | ICD-10-CM | POA: Diagnosis not present

## 2022-06-01 DIAGNOSIS — I739 Peripheral vascular disease, unspecified: Secondary | ICD-10-CM | POA: Diagnosis not present

## 2022-06-01 DIAGNOSIS — W2201XA Walked into wall, initial encounter: Secondary | ICD-10-CM | POA: Diagnosis present

## 2022-06-01 DIAGNOSIS — W2209XA Striking against other stationary object, initial encounter: Secondary | ICD-10-CM | POA: Diagnosis present

## 2022-06-01 DIAGNOSIS — E785 Hyperlipidemia, unspecified: Secondary | ICD-10-CM | POA: Diagnosis present

## 2022-06-01 DIAGNOSIS — I959 Hypotension, unspecified: Secondary | ICD-10-CM | POA: Diagnosis not present

## 2022-06-01 DIAGNOSIS — Z96652 Presence of left artificial knee joint: Secondary | ICD-10-CM | POA: Diagnosis present

## 2022-06-01 DIAGNOSIS — E113293 Type 2 diabetes mellitus with mild nonproliferative diabetic retinopathy without macular edema, bilateral: Secondary | ICD-10-CM | POA: Diagnosis not present

## 2022-06-01 DIAGNOSIS — I10 Essential (primary) hypertension: Secondary | ICD-10-CM | POA: Diagnosis not present

## 2022-06-01 DIAGNOSIS — S72122A Displaced fracture of lesser trochanter of left femur, initial encounter for closed fracture: Secondary | ICD-10-CM | POA: Diagnosis not present

## 2022-06-01 DIAGNOSIS — W010XXA Fall on same level from slipping, tripping and stumbling without subsequent striking against object, initial encounter: Secondary | ICD-10-CM | POA: Diagnosis present

## 2022-06-01 DIAGNOSIS — Z6831 Body mass index (BMI) 31.0-31.9, adult: Secondary | ICD-10-CM | POA: Diagnosis not present

## 2022-06-01 DIAGNOSIS — I2510 Atherosclerotic heart disease of native coronary artery without angina pectoris: Secondary | ICD-10-CM | POA: Diagnosis not present

## 2022-06-01 DIAGNOSIS — Z043 Encounter for examination and observation following other accident: Secondary | ICD-10-CM | POA: Diagnosis not present

## 2022-06-01 DIAGNOSIS — W19XXXA Unspecified fall, initial encounter: Secondary | ICD-10-CM | POA: Diagnosis not present

## 2022-06-01 DIAGNOSIS — Z79890 Hormone replacement therapy (postmenopausal): Secondary | ICD-10-CM | POA: Diagnosis not present

## 2022-06-01 DIAGNOSIS — I6782 Cerebral ischemia: Secondary | ICD-10-CM | POA: Diagnosis not present

## 2022-06-01 DIAGNOSIS — E113299 Type 2 diabetes mellitus with mild nonproliferative diabetic retinopathy without macular edema, unspecified eye: Secondary | ICD-10-CM | POA: Diagnosis present

## 2022-06-01 DIAGNOSIS — I6381 Other cerebral infarction due to occlusion or stenosis of small artery: Secondary | ICD-10-CM | POA: Diagnosis not present

## 2022-06-01 DIAGNOSIS — R262 Difficulty in walking, not elsewhere classified: Secondary | ICD-10-CM | POA: Diagnosis not present

## 2022-06-01 DIAGNOSIS — Z743 Need for continuous supervision: Secondary | ICD-10-CM | POA: Diagnosis not present

## 2022-06-01 DIAGNOSIS — S72142D Displaced intertrochanteric fracture of left femur, subsequent encounter for closed fracture with routine healing: Secondary | ICD-10-CM | POA: Diagnosis not present

## 2022-06-01 DIAGNOSIS — Z8349 Family history of other endocrine, nutritional and metabolic diseases: Secondary | ICD-10-CM

## 2022-06-01 DIAGNOSIS — I509 Heart failure, unspecified: Secondary | ICD-10-CM | POA: Diagnosis not present

## 2022-06-01 DIAGNOSIS — G319 Degenerative disease of nervous system, unspecified: Secondary | ICD-10-CM | POA: Diagnosis not present

## 2022-06-01 DIAGNOSIS — I25119 Atherosclerotic heart disease of native coronary artery with unspecified angina pectoris: Secondary | ICD-10-CM | POA: Diagnosis not present

## 2022-06-01 DIAGNOSIS — E1122 Type 2 diabetes mellitus with diabetic chronic kidney disease: Secondary | ICD-10-CM | POA: Diagnosis not present

## 2022-06-01 DIAGNOSIS — Y92013 Bedroom of single-family (private) house as the place of occurrence of the external cause: Secondary | ICD-10-CM | POA: Diagnosis not present

## 2022-06-01 DIAGNOSIS — M19072 Primary osteoarthritis, left ankle and foot: Secondary | ICD-10-CM | POA: Diagnosis not present

## 2022-06-01 DIAGNOSIS — Z471 Aftercare following joint replacement surgery: Secondary | ICD-10-CM | POA: Diagnosis not present

## 2022-06-01 DIAGNOSIS — R079 Chest pain, unspecified: Secondary | ICD-10-CM | POA: Diagnosis not present

## 2022-06-01 DIAGNOSIS — Z833 Family history of diabetes mellitus: Secondary | ICD-10-CM

## 2022-06-01 DIAGNOSIS — Z9889 Other specified postprocedural states: Secondary | ICD-10-CM

## 2022-06-01 DIAGNOSIS — S72002A Fracture of unspecified part of neck of left femur, initial encounter for closed fracture: Principal | ICD-10-CM

## 2022-06-01 DIAGNOSIS — M7989 Other specified soft tissue disorders: Secondary | ICD-10-CM | POA: Diagnosis not present

## 2022-06-01 LAB — CBC WITH DIFFERENTIAL/PLATELET
Abs Immature Granulocytes: 0.07 10*3/uL (ref 0.00–0.07)
Basophils Absolute: 0.1 10*3/uL (ref 0.0–0.1)
Basophils Relative: 1 %
Eosinophils Absolute: 0 10*3/uL (ref 0.0–0.5)
Eosinophils Relative: 0 %
HCT: 43.4 % (ref 36.0–46.0)
Hemoglobin: 14.4 g/dL (ref 12.0–15.0)
Immature Granulocytes: 1 %
Lymphocytes Relative: 6 %
Lymphs Abs: 0.6 10*3/uL — ABNORMAL LOW (ref 0.7–4.0)
MCH: 29.1 pg (ref 26.0–34.0)
MCHC: 33.2 g/dL (ref 30.0–36.0)
MCV: 87.9 fL (ref 80.0–100.0)
Monocytes Absolute: 0.5 10*3/uL (ref 0.1–1.0)
Monocytes Relative: 5 %
Neutro Abs: 9.5 10*3/uL — ABNORMAL HIGH (ref 1.7–7.7)
Neutrophils Relative %: 87 %
Platelets: 298 10*3/uL (ref 150–400)
RBC: 4.94 MIL/uL (ref 3.87–5.11)
RDW: 12.9 % (ref 11.5–15.5)
WBC: 10.8 10*3/uL — ABNORMAL HIGH (ref 4.0–10.5)
nRBC: 0 % (ref 0.0–0.2)

## 2022-06-01 LAB — BASIC METABOLIC PANEL
Anion gap: 11 (ref 5–15)
BUN: 27 mg/dL — ABNORMAL HIGH (ref 8–23)
CO2: 22 mmol/L (ref 22–32)
Calcium: 9.1 mg/dL (ref 8.9–10.3)
Chloride: 102 mmol/L (ref 98–111)
Creatinine, Ser: 1.22 mg/dL — ABNORMAL HIGH (ref 0.44–1.00)
GFR, Estimated: 45 mL/min — ABNORMAL LOW (ref >60)
Glucose, Bld: 269 mg/dL — ABNORMAL HIGH (ref 70–99)
Potassium: 3.9 mmol/L (ref 3.5–5.1)
Sodium: 135 mmol/L (ref 135–145)

## 2022-06-01 LAB — TYPE AND SCREEN
ABO/RH(D): A NEG
Antibody Screen: NEGATIVE

## 2022-06-01 MED ORDER — ROSUVASTATIN CALCIUM 5 MG PO TABS
5.0000 mg | ORAL_TABLET | Freq: Every day | ORAL | Status: DC
  Administered 2022-06-02 – 2022-06-05 (×4): 5 mg via ORAL
  Filled 2022-06-01 (×4): qty 1

## 2022-06-01 MED ORDER — DOCUSATE SODIUM 100 MG PO CAPS
100.0000 mg | ORAL_CAPSULE | Freq: Two times a day (BID) | ORAL | Status: DC
  Administered 2022-06-03 – 2022-06-05 (×3): 100 mg via ORAL
  Filled 2022-06-01 (×5): qty 1

## 2022-06-01 MED ORDER — LACTATED RINGERS IV SOLN: INTRAVENOUS | Status: AC

## 2022-06-01 MED ORDER — INSULIN ASPART 100 UNIT/ML IJ SOLN
0.0000 [IU] | Freq: Every day | INTRAMUSCULAR | Status: DC
  Administered 2022-06-02: 5 [IU] via SUBCUTANEOUS
  Administered 2022-06-03: 2 [IU] via SUBCUTANEOUS

## 2022-06-01 MED ORDER — CARVEDILOL 6.25 MG PO TABS
6.2500 mg | ORAL_TABLET | Freq: Two times a day (BID) | ORAL | Status: DC
  Administered 2022-06-02 – 2022-06-05 (×7): 6.25 mg via ORAL
  Filled 2022-06-01 (×7): qty 1

## 2022-06-01 MED ORDER — MELATONIN 5 MG PO TABS: 10.0000 mg | ORAL_TABLET | Freq: Every evening | ORAL | Status: DC | PRN

## 2022-06-01 MED ORDER — HYDROCODONE-ACETAMINOPHEN 5-325 MG PO TABS
1.0000 | ORAL_TABLET | Freq: Four times a day (QID) | ORAL | Status: DC | PRN
  Filled 2022-06-01: qty 1

## 2022-06-01 MED ORDER — ONDANSETRON HCL 4 MG/2ML IJ SOLN
4.0000 mg | Freq: Four times a day (QID) | INTRAMUSCULAR | Status: DC | PRN
  Administered 2022-06-02: 4 mg via INTRAVENOUS
  Filled 2022-06-01: qty 2

## 2022-06-01 MED ORDER — FENTANYL CITRATE PF 50 MCG/ML IJ SOSY
50.0000 ug | PREFILLED_SYRINGE | INTRAMUSCULAR | Status: AC | PRN
  Administered 2022-06-01 – 2022-06-02 (×2): 50 ug via INTRAVENOUS
  Filled 2022-06-01 (×2): qty 1

## 2022-06-01 MED ORDER — HYDROMORPHONE HCL 1 MG/ML IJ SOLN
0.5000 mg | INTRAMUSCULAR | Status: DC | PRN
  Administered 2022-06-02 – 2022-06-04 (×4): 0.5 mg via INTRAVENOUS
  Filled 2022-06-01 (×2): qty 0.5
  Filled 2022-06-01: qty 1
  Filled 2022-06-01: qty 0.5

## 2022-06-01 MED ORDER — INSULIN ASPART 100 UNIT/ML IJ SOLN
0.0000 [IU] | Freq: Three times a day (TID) | INTRAMUSCULAR | Status: DC
  Administered 2022-06-02 (×2): 5 [IU] via SUBCUTANEOUS
  Administered 2022-06-02: 3 [IU] via SUBCUTANEOUS
  Administered 2022-06-03: 5 [IU] via SUBCUTANEOUS
  Administered 2022-06-03: 3 [IU] via SUBCUTANEOUS
  Administered 2022-06-03 – 2022-06-04 (×2): 5 [IU] via SUBCUTANEOUS
  Administered 2022-06-04 – 2022-06-05 (×3): 2 [IU] via SUBCUTANEOUS

## 2022-06-01 MED ORDER — LEVOTHYROXINE SODIUM 88 MCG PO TABS
88.0000 ug | ORAL_TABLET | Freq: Every day | ORAL | Status: DC
  Filled 2022-06-01: qty 1

## 2022-06-01 MED ORDER — AMLODIPINE BESYLATE 5 MG PO TABS
5.0000 mg | ORAL_TABLET | Freq: Every day | ORAL | Status: DC
  Administered 2022-06-02 – 2022-06-05 (×4): 5 mg via ORAL
  Filled 2022-06-01 (×4): qty 1

## 2022-06-01 MED ORDER — ISOSORBIDE MONONITRATE ER 30 MG PO TB24
30.0000 mg | ORAL_TABLET | Freq: Every day | ORAL | Status: DC
  Administered 2022-06-02 – 2022-06-05 (×4): 30 mg via ORAL
  Filled 2022-06-01 (×4): qty 1

## 2022-06-01 MED ORDER — HEPARIN SODIUM (PORCINE) 5000 UNIT/ML IJ SOLN
5000.0000 [IU] | Freq: Three times a day (TID) | INTRAMUSCULAR | Status: DC
  Administered 2022-06-02: 5000 [IU] via SUBCUTANEOUS
  Filled 2022-06-01: qty 1

## 2022-06-01 NOTE — Assessment & Plan Note (Signed)
Stable. Continue norvasc 5 mg qd, coreg 6.25 mg bid.

## 2022-06-01 NOTE — Subjective & Objective (Signed)
CC: left hip pain after fall HPI: 81 year old female history of diabetes on insulin, hypertension, history of coronary disease, hypothyroidism presents to the ER today after a fall at home.  Patient states that she had to urinate.  She was rushing to get out of her bed and into her bathroom.  She states that she lost her balance, hit a wall and then fell onto her left hip.  She had immediate pain.  She is unable to stand or bear weight on her left leg.  She came to the ER.  X-rays show intertrochanteric fracture of the left hip.  Orthopedics has been consulted.  [facility] hospitalist contacted for admission.

## 2022-06-01 NOTE — ED Provider Notes (Signed)
[facility] EMERGENCY DEPARTMENT Provider Note   CSN: [id number] Arrival date & time: 06/01/22  2038     History  Chief Complaint  Patient presents with   Danielle Murray is a 80 y.o. female.  Presented to the emergency department due to concern for fall.  Patient states that she slipped on tile, hit her head on the cabinet on her way down.  Landed on her left leg.  Having severe pain in her left hip, also some pain in knee and ankle.  No pain in neck.  Up to 10 out of 10 in severity, worse with movement improved with rest.  Received 150 mcg of fentanyl by EMS.  History obtained from patient, EMS.  HPI     Home Medications Prior to Admission medications   Medication Sig Start Date End Date Taking? Authorizing Provider  amLODipine (NORVASC) 5 MG tablet Take 1 tablet (5 mg total) by mouth daily. Please keep upcoming appointment for future refills. Thank you. 04/30/22   Josue Hector, MD  carvedilol (COREG) 6.25 MG tablet TAKE 1 TABLET BY MOUTH TWICE DAILY WITH A MEAL 11/14/21   Josue Hector, MD  Cholecalciferol (VITAMIN D3 PO) Take 1 tablet by mouth daily.    [provider]  clopidogrel (PLAVIX) 75 MG tablet Take 1 tablet by mouth once daily 05/04/22   Josue Hector, MD  Dulaglutide (TRULICITY) 5.27 PO/2.4MP SOPN Inject into the skin once a week.    [provider]  furosemide (LASIX) 40 MG tablet Take 1 tablet (40 mg total) by mouth daily as needed for fluid or edema. 10/14/20   Josue Hector, MD  isosorbide mononitrate (IMDUR) 30 MG 24 hr tablet Take 1 tablet by mouth once daily 11/14/21   Josue Hector, MD  levothyroxine (SYNTHROID, LEVOTHROID) 88 MCG tablet Take 88 mcg by mouth at bedtime.     [provider]  nitroGLYCERIN (NITROSTAT) 0.4 MG SL tablet Place 1 tablet (0.4 mg total) under the tongue every 5 (five) minutes as needed for chest pain (3 doses max). 08/04/18   Josue Hector, MD  NOVOLIN 70/30 FLEXPEN (70-30) 100  UNIT/ML KwikPen Inject 45 Units into the skin in the morning and at bedtime. 04/08/21   [provider]  Probiotic Product (PROBIOTIC PO) Take 1 tablet by mouth as needed.     [provider]  rosuvastatin (CRESTOR) 5 MG tablet Take 1 tablet (5 mg total) by mouth daily. 10/14/20   Josue Hector, MD      Allergies    Penicillins    Review of Systems   Review of Systems  Constitutional:  Negative for chills and fever.  HENT:  Negative for ear pain and sore throat.   Eyes:  Negative for pain and visual disturbance.  Respiratory:  Negative for cough and shortness of breath.   Cardiovascular:  Negative for chest pain and palpitations.  Gastrointestinal:  Negative for abdominal pain and vomiting.  Genitourinary:  Negative for dysuria and hematuria.  Musculoskeletal:  Positive for arthralgias. Negative for back pain.  Skin:  Negative for color change and rash.  Neurological:  Negative for seizures and syncope.  All other systems reviewed and are negative.   Physical Exam Updated Vital Signs BP (!) 154/63   Pulse 85   Temp 98.9 F (37.2 C)   Resp 14   Ht '5\' 4"'$  (1.626 m)   Wt 81.6 kg   SpO2 93%  BMI 30.90 kg/m  Physical Exam Vitals and nursing note reviewed.  Constitutional:      General: She is not in acute distress.    Appearance: She is well-developed.  HENT:     Head: Normocephalic.  Eyes:     Conjunctiva/sclera: Conjunctivae normal.  Cardiovascular:     Rate and Rhythm: Normal rate and regular rhythm.     Heart sounds: No murmur heard. Pulmonary:     Effort: Pulmonary effort is normal. No respiratory distress.     Breath sounds: Normal breath sounds.  Abdominal:     Palpations: Abdomen is soft.     Tenderness: There is no abdominal tenderness.  Musculoskeletal:     Cervical back: Neck supple.     Comments: Back: no C, T, L spine TTP, no step off or deformity RUE: no TTP throughout, no deformity, normal joint ROM, radial pulse intact, distal  sensation and motor intact LUE: no TTP throughout, no deformity, normal joint ROM, radial pulse intact, distal sensation and motor intact RLE:  no TTP throughout, no deformity, normal joint ROM, distal pulse, sensation and motor intact LLE: Tenderness to palpation in hip, leg is shortened and externally rotated, good distal DP and PT pulse, sensation and motor intact distally  Skin:    General: Skin is warm and dry.     Capillary Refill: Capillary refill takes less than 2 seconds.  Neurological:     Mental Status: She is alert.  Psychiatric:        Mood and Affect: Mood normal.     ED Results / Procedures / Treatments   Labs (all labs ordered are listed, but only abnormal results are displayed) Labs Reviewed  CBC WITH DIFFERENTIAL/PLATELET - Abnormal; Notable for the following components:      Result Value   WBC 10.8 (*)    Neutro Abs 9.5 (*)    Lymphs Abs 0.6 (*)    All other components within normal limits  BASIC METABOLIC PANEL - Abnormal; Notable for the following components:   Glucose, Bld 269 (*)    BUN 27 (*)    Creatinine, Ser 1.22 (*)    GFR, Estimated 45 (*)    All other components within normal limits  HEMOGLOBIN A1C  TYPE AND SCREEN    EKG EKG Interpretation  Date/Time:  Friday June 01 2022 22:43:15 EDT Ventricular Rate:  84 PR Interval:  197 QRS Duration: 94 QT Interval:  381 QTC Calculation: 451 R Axis:   -22 Text Interpretation: Sinus or ectopic atrial rhythm Borderline left axis deviation Anterior infarct, old Nonspecific T abnormalities, lateral leads Confirmed by Madalyn Rob [phone]) on 06/01/2022 11:16:51 PM  Radiology DG Knee Left Port  Result Date: 06/01/2022 CLINICAL DATA:  Pain after a fall. EXAM: PORTABLE LEFT KNEE - 1-2 VIEW COMPARISON:  06/27/2005 FINDINGS: Postoperative left total knee arthroplasty including patellar femoral component. Components appear well seated. No acute displaced fractures are identified. No focal bone lesion or bone  destruction. No significant effusion. Soft tissues are unremarkable. Vascular calcifications. IMPRESSION: Left total knee arthroplasty. Components appear well seated. No acute displaced fractures. Electronically Signed   By: Lucienne Capers M.D.   On: 06/01/2022 22:18   DG Chest Portable 1 View  Result Date: 06/01/2022 CLINICAL DATA:  Pain after a fall. EXAM: PORTABLE CHEST 1 VIEW COMPARISON:  None Available. FINDINGS: Shallow inspiration. Cardiac enlargement. No vascular congestion, edema, or consolidation. No pleural effusions. No pneumothorax. Mediastinal contours appear intact. IMPRESSION: No active disease. Electronically Signed  By: Lucienne Capers M.D.   On: 06/01/2022 22:17   DG Ankle 2 Views Left  Result Date: 06/01/2022 CLINICAL DATA:  Pain after a fall. EXAM: LEFT ANKLE - 2 VIEW COMPARISON:  None Available. FINDINGS: Nonstandard positioning limits evaluation. As visualized, no evidence of acute fracture or dislocation. Degenerative changes are present in the intertarsal joints. Vascular calcifications in the soft tissues. IMPRESSION: No acute fractures identified.  Degenerative changes. Electronically Signed   By: Lucienne Capers M.D.   On: 06/01/2022 22:16   DG Hip Unilat W or Wo Pelvis 2-3 Views Left  Result Date: 06/01/2022 CLINICAL DATA:  Pain after a fall. EXAM: DG HIP (WITH OR WITHOUT PELVIS) 2-3V LEFT COMPARISON:  None Available. FINDINGS: Comminuted inter trochanteric fractures of the left proximal femur with displaced lesser trochanteric fragment and varus angulation of the hip. No dislocation. Pelvis and right hip appear intact. Vascular calcifications are present. IMPRESSION: Comminuted inter trochanteric fractures of the left hip with varus angulation and displacement of the lesser trochanteric fragment. Electronically Signed   By: Lucienne Capers M.D.   On: 06/01/2022 22:16   CT Cervical Spine Wo Contrast  Result Date: 06/01/2022 CLINICAL DATA:  Status post fall. EXAM: CT  CERVICAL SPINE WITHOUT CONTRAST TECHNIQUE: Multidetector CT imaging of the cervical spine was performed without intravenous contrast. Multiplanar CT image reconstructions were also generated. RADIATION DOSE REDUCTION: This exam was performed according to the departmental dose-optimization program which includes automated exposure control, adjustment of the mA and/or kV according to patient size and/or use of iterative reconstruction technique. COMPARISON:  None Available. FINDINGS: Alignment: Normal. Skull base and vertebrae: No acute fracture. Chronic and degenerative changes are seen along the tip of the dens. Soft tissues and spinal canal: No prevertebral fluid or swelling. No visible canal hematoma. Disc levels: Mild endplate sclerosis and moderate to marked severity anterior osteophyte formation are seen at the levels of C2-C3, C3-C4, C4-C5, C5-C6, C6-C7 C7-T1. There is marked severity narrowing of the anterior atlantoaxial articulation. Mild to moderate severity intervertebral disc space narrowing is seen at C2-C3, C3-C4, C4-C5, C5-C6, C6-C7 and C7-T1. Bilateral moderate to marked severity multilevel facet joint hypertrophy is noted. Upper chest: Negative. Other: None. IMPRESSION: 1. No acute fracture or subluxation in the cervical spine. 2. Mild to moderate severity multilevel degenerative changes, as described above. Electronically Signed   By: Virgina Norfolk M.D.   On: 06/01/2022 21:28   CT Head Wo Contrast  Result Date: 06/01/2022 CLINICAL DATA:  Status post fall. EXAM: CT HEAD WITHOUT CONTRAST TECHNIQUE: Contiguous axial images were obtained from the base of the skull through the vertex without intravenous contrast. RADIATION DOSE REDUCTION: This exam was performed according to the departmental dose-optimization program which includes automated exposure control, adjustment of the mA and/or kV according to patient size and/or use of iterative reconstruction technique. COMPARISON:  None Available.  FINDINGS: Brain: There is mild cerebral atrophy with widening of the extra-axial spaces and ventricular dilatation. There are areas of decreased attenuation within the white matter tracts of the supratentorial brain, consistent with microvascular disease changes. Small, chronic bilateral basal ganglia lacunar infarcts are noted. Vascular: No hyperdense vessel or unexpected calcification. Skull: Normal. Negative for fracture or focal lesion. Sinuses/Orbits: No acute finding. Other: None. IMPRESSION: 1. No acute intracranial abnormality. 2. Generalized cerebral atrophy with chronic white matter small vessel ischemic changes. Electronically Signed   By: Virgina Norfolk M.D.   On: 06/01/2022 21:14    Procedures Procedures    Medications Ordered in  ED Medications  fentaNYL (SUBLIMAZE) injection 50 mcg (50 mcg Intravenous Given 06/01/22 2239)    ED Course/ Medical Decision Making/ A&P                           Medical Decision Making Amount and/or Complexity of Data Reviewed Labs: ordered. Radiology: ordered.  Risk Prescription drug management. Decision regarding hospitalization.   81 year old lady presenting to ER after a ground-level fall.  There was report of head trauma as well as left leg pain.  Leg is shortened, tenderness to hip most prominently.  No other significant traumatic findings on exam.  Check plain films, I independently reviewed and interpreted results, intertrochanteric fracture present.  CT head and C-spine negative.  Patient reports that many years ago she had a knee replacement however she cannot recall the name of the surgeon or their office.  I reviewed epic and was not able to find any old orthopedic notes.  Therefore consulted unassigned Ortho. I discussed case with Dr. Clayborne Dana -he advises admission to medicine, n.p.o. at midnight, likely OR tomorrow.  Discussed case with Dr. Bridgett Larsson with [facility], he will admit.  Updated patient, family throughout visit.  Pain is well  controlled at this time.        Final Clinical Impression(s) / ED Diagnoses Final diagnoses:  Closed fracture of left hip, initial encounter [facility])    Rx / DC Orders ED Discharge Orders     None         Lucrezia Starch, MD 06/01/22 2318

## 2022-06-01 NOTE — Assessment & Plan Note (Signed)
Stable

## 2022-06-01 NOTE — Progress Notes (Signed)
Chaplain responding to page regarding level 2 trauma for pt Danielle Murray. Pt unavailable at time of visit and no family present at this time.  Chaplain services remain available for follow-up spiritual/emotional support as needed.  Ortencia Kick, [place]     06/01/22 2100  Clinical Encounter Type  Visited With Patient not available  Visit Type Initial;ED;Trauma

## 2022-06-01 NOTE — Assessment & Plan Note (Signed)
Stable. No chest pain. Continue imdur 30 mg, crestor 5 mg, coreg 6.25 mg bid.

## 2022-06-01 NOTE — Assessment & Plan Note (Signed)
Stable. Continue synthroid 88 mcg daily.

## 2022-06-01 NOTE — ED Notes (Signed)
Trauma Response Nurse Documentation   Danielle Murray is a 81 y.o. female arriving to [facility] ED via [facility]  On clopidogrel 75 mg daily. Trauma was activated as a Level 2 by [place], Agricultural consultant based on the following trauma criteria Elderly patients > 65 with head trauma on anti-coagulation (excluding ASA). Trauma team at the bedside on patient arrival. Patient cleared for CT by Dr. Roslynn Amble. Patient to CT with team. GCS 15.  History   Past Medical History:  Diagnosis Date   Arthritis    "fingers, all my joints" (09/03/2013)   Coronary artery disease    a. 08/2013: unstable angina s/p PTCA/DES to LAD, medical therapy for residual severe stenosis of a mid-distal left PDA branch of large dominant LCx, moderate RCA disease (consider PCI of L-PDA if she fails med rx).   DEPRESSION    Diverticulosis    Hyperlipidemia    Hypertension    HYPOTHYROIDISM    Preseptal cellulitis of right upper eyelid 03/10/2016   SVT (supraventricular tachycardia) ([place])    a. very brief transient SVT during 08/2013 admission overnight.   Type II diabetes mellitus ([place])    Dr Chalmers Cater     Past Surgical History:  Procedure Laterality Date   ABDOMINAL HYSTERECTOMY  1989   no BSO; dysfunctional menses   CATARACT EXTRACTION W/ INTRAOCULAR LENS  IMPLANT, BILATERAL Bilateral 2014   CATARACT EXTRACTION W/PHACO Right 07/02/2013   CATARACT EXTRACTION W/PHACO Left 06/01/2013   COLONOSCOPY  2003   Tics   CORONARY ANGIOPLASTY WITH STENT PLACEMENT  09/03/2013   "1" (09/03/2013)   LEFT HEART CATHETERIZATION WITH CORONARY ANGIOGRAM N/A 09/03/2013   Procedure: LEFT HEART CATHETERIZATION WITH CORONARY ANGIOGRAM;  Surgeon: Blane Ohara, MD;  Location: [facility] CATH LAB;  Service: Cardiovascular;  Laterality: N/A;   TOTAL KNEE ARTHROPLASTY Left 06/2005   Dr Gladstone Lighter       Initial Focused Assessment (If applicable, or please see trauma documentation): See event summary.  CT's Completed:   CT Head , CT  c-spine  Interventions:  See event summary.  Plan for disposition:  None at this time.  Consults completed:    Event Summary: Patient was brought in by [facility]. Per patient, she was trying to hurry to the restroom and had a mechanical fall. She is complaining of left leg pain. Shortening and rotation noted to that leg. Patient does take Plavix. She did hit her head, no trauma noted, patient denies loss of consciousness. Patient receiving oxygen via nasal canula upon arrival to department. Per EMS patient received 150 mcg fentanyl enroute and dropped her SpO2 into the 80s. Patient was also placed in c-collar via EMS.  20G PIV LAC Trauma labs CT head CT c-spine DG chest DG hip left DG knee left DG ankle left    Bedside handoff with ED RN Amy.    Trudee Kuster  Trauma Response RN  Please call TRN at [phone] for further assistance.

## 2022-06-01 NOTE — ED Triage Notes (Signed)
Pt arrived from home by EMS post fall. Pt reports rushing to the restroom, slipped on tile and fell striking her head on the cabinet. Takes Plavix. Shortening and rotation noted to L leg; cms intact. EMS gave 160mg Fentanyl; oxygen dipped to into upper 80s, placed on 3L [place]; also given '4mg'$  zofran for nausea. C-collar in place on arrival at 2032; cleared by Dr. DRoslynn Ambleat 2035, c-collar removed

## 2022-06-01 NOTE — Assessment & Plan Note (Signed)
Admit to inpatient med/surg bed. RCRI calculates 6.6% risk of perioperative cardiac event. Keep NPO. Orthopedics has been consulted. Prn IV dilaudid for pain. Gentle IVF.

## 2022-06-01 NOTE — Assessment & Plan Note (Signed)
Chronic. Check A1c. Add SSI.

## 2022-06-01 NOTE — Progress Notes (Signed)
Discussed patient with EDP. Patient with L intertroch femur fx sustained during fall earlier today. Recommend ORIF tomorrow pending medical clearance. NPO after midnight.

## 2022-06-01 NOTE — H&P (Signed)
History and Physical    Danielle Murray:[id number] DOB: Oct 18, 1941 DOA: 06/01/2022  DOS: the patient was seen and examined on 06/01/2022  PCP: [name], MD   Patient coming from: Home  I have personally briefly reviewed patient's old medical records in [place]  CC: left hip pain after fall HPI: 81 year old female history of diabetes on insulin, hypertension, history of coronary disease, hypothyroidism presents to the ER today after a fall at home.  Patient states that she had to urinate.  She was rushing to get out of her bed and into her bathroom.  She states that she lost her balance, hit a wall and then fell onto her left hip.  She had immediate pain.  She is unable to stand or bear weight on her left leg.  She came to the ER.  X-rays show intertrochanteric fracture of the left hip.  Orthopedics has been consulted.  [facility] hospital[facility] contacted for admission.    ED Course: xrays shows intertroch fracture left hip  Review of Systems:  Review of Systems  Constitutional: Negative.   HENT: Negative.    Eyes: Negative.   Respiratory: Negative.    Cardiovascular: Negative.   Gastrointestinal: Negative.   Genitourinary: Negative.   Musculoskeletal:        Left hip pain  Skin: Negative.   Neurological: Negative.   Endo/Heme/Allergies: Negative.   Psychiatric/Behavioral: Negative.    All other systems reviewed and are negative.   Past Medical History:  Diagnosis Date   Arthritis    "fingers, all my joints" (09/03/2013)   Coronary artery disease    a. 08/2013: unstable angina s/p PTCA/DES to LAD, medical therapy for residual severe stenosis of a mid-distal left PDA branch of large dominant LCx, moderate RCA disease (consider PCI of L-PDA if she fails med rx).   DEPRESSION    Diverticulosis    Hyperlipidemia    Hypertension    HYPOTHYROIDISM    Preseptal cellulitis of right upper eyelid 03/10/2016   SVT (supraventricular tachycardia) ([place])    a. very brief  transient SVT during 08/2013 admission overnight.   Type II diabetes mellitus ([place])    Dr Chalmers Cater    Past Surgical History:  Procedure Laterality Date   ABDOMINAL HYSTERECTOMY  1989   no BSO; dysfunctional menses   CATARACT EXTRACTION W/ INTRAOCULAR LENS  IMPLANT, BILATERAL Bilateral 2014   CATARACT EXTRACTION W/PHACO Right 07/02/2013   CATARACT EXTRACTION W/PHACO Left 06/01/2013   COLONOSCOPY  2003   Tics   CORONARY ANGIOPLASTY WITH STENT PLACEMENT  09/03/2013   "1" (09/03/2013)   LEFT HEART CATHETERIZATION WITH CORONARY ANGIOGRAM N/A 09/03/2013   Procedure: LEFT HEART CATHETERIZATION WITH CORONARY ANGIOGRAM;  Surgeon: Blane Ohara, MD;  Location: [facility] CATH LAB;  Service: Cardiovascular;  Laterality: N/A;   TOTAL KNEE ARTHROPLASTY Left 06/2005   Dr Gladstone Lighter     reports that she has never smoked. She has never used smokeless tobacco. She reports current alcohol use. She reports that she does not use drugs.  Allergies  Allergen Reactions   Penicillins     Diffuse joint pain @ age 7 in context of Scarlet Fever    Family History  Problem Relation Age of Onset   Asthma Mother    Hypertension Mother    Heart failure Mother    Hypertension Father    Stroke Father 50   Heart attack Father 29   Hypertension Sister    Hyperlipidemia Sister    Heart attack Sister  pre 51; Twin   Diabetes Sister        her TWIN   Hypertension Brother    Hyperlipidemia Brother    Coronary artery disease Brother        stents late 50s   Breast cancer Neg Hx     Prior to Admission medications   Medication Sig Start Date End Date Taking? Authorizing Provider  amLODipine (NORVASC) 5 MG tablet Take 1 tablet (5 mg total) by mouth daily. Please keep upcoming appointment for future refills. Thank you. 04/30/22   Josue Hector, MD  carvedilol (COREG) 6.25 MG tablet TAKE 1 TABLET BY MOUTH TWICE DAILY WITH A MEAL 11/14/21   Josue Hector, MD  Cholecalciferol (VITAMIN D3 PO) Take 1 tablet by mouth  daily.    [provider]  clopidogrel (PLAVIX) 75 MG tablet Take 1 tablet by mouth once daily 05/04/22   Josue Hector, MD  Dulaglutide (TRULICITY) 9.38 BO/1.7PZ SOPN Inject into the skin once a week.    [provider]  furosemide (LASIX) 40 MG tablet Take 1 tablet (40 mg total) by mouth daily as needed for fluid or edema. 10/14/20   Josue Hector, MD  isosorbide mononitrate (IMDUR) 30 MG 24 hr tablet Take 1 tablet by mouth once daily 11/14/21   Josue Hector, MD  levothyroxine (SYNTHROID, LEVOTHROID) 88 MCG tablet Take 88 mcg by mouth at bedtime.     [provider]  nitroGLYCERIN (NITROSTAT) 0.4 MG SL tablet Place 1 tablet (0.4 mg total) under the tongue every 5 (five) minutes as needed for chest pain (3 doses max). 08/04/18   Josue Hector, MD  NOVOLIN 70/30 FLEXPEN (70-30) 100 UNIT/ML KwikPen Inject 45 Units into the skin in the morning and at bedtime. 04/08/21   [provider]  Probiotic Product (PROBIOTIC PO) Take 1 tablet by mouth as needed.     [provider]  rosuvastatin (CRESTOR) 5 MG tablet Take 1 tablet (5 mg total) by mouth daily. 10/14/20   Josue Hector, MD    Physical Exam: Vitals:   06/01/22 2200 06/01/22 2215 06/01/22 2230 06/01/22 2245  BP: (!) 149/59 (!) 142/48 (!) 146/49   Pulse: 81 81 81   Resp: (!) 22 (!) 24 (!) 24   Temp:    98.9 F (37.2 C)  SpO2: 93% 93% 94%   Weight:      Height:        Physical Exam Vitals and nursing note reviewed.  Constitutional:      General: She is not in acute distress.    Appearance: Normal appearance. She is obese. She is not ill-appearing, toxic-appearing or diaphoretic.  HENT:     Head: Normocephalic and atraumatic.     Nose: Nose normal. No rhinorrhea.  Eyes:     General: No scleral icterus. Cardiovascular:     Rate and Rhythm: Normal rate and regular rhythm.     Pulses: Normal pulses.  Pulmonary:     Effort: Pulmonary effort is normal. No respiratory distress.      Breath sounds: Normal breath sounds. No wheezing or rales.  Abdominal:     General: Bowel sounds are normal. There is no distension.     Palpations: Abdomen is soft.     Tenderness: There is no abdominal tenderness. There is no guarding or rebound.  Musculoskeletal:     Right lower leg: No edema.     Left lower leg: No edema.     Comments:  Left lower extremity shortened  Skin:    General: Skin is warm and dry.     Capillary Refill: Capillary refill takes less than 2 seconds.  Neurological:     General: No focal deficit present.     Mental Status: She is alert and oriented to person, place, and time.      Labs on Admission: I have personally reviewed following labs and imaging studies  CBC: Recent Labs  Lab 06/01/22 2037  WBC 10.8*  NEUTROABS 9.5*  HGB 14.4  HCT 43.4  MCV 87.9  PLT 585   Basic Metabolic Panel: Recent Labs  Lab 06/01/22 2037  NA 135  K 3.9  CL 102  CO2 22  GLUCOSE 269*  BUN 27*  CREATININE 1.22*  CALCIUM 9.1   GFR: Estimated Creatinine Clearance: 38 mL/min (A) (by C-G formula based on SCr of 1.22 mg/dL (H)). Liver Function Tests: No results for input(s): "AST", "ALT", "ALKPHOS", "BILITOT", "PROT", "ALBUMIN" in the last 168 hours. No results for input(s): "LIPASE", "AMYLASE" in the last 168 hours. No results for input(s): "AMMONIA" in the last 168 hours. Coagulation Profile: No results for input(s): "INR", "PROTIME" in the last 168 hours. Cardiac Enzymes: No results for input(s): "CKTOTAL", "CKMB", "CKMBINDEX", "TROPONINI", "TROPONINIHS" in the last 168 hours. BNP (last 3 results) No results for input(s): "PROBNP" in the last 8760 hours. HbA1C: No results for input(s): "HGBA1C" in the last 72 hours. CBG: No results for input(s): "GLUCAP" in the last 168 hours. Lipid Profile: No results for input(s): "CHOL", "HDL", "LDLCALC", "TRIG", "CHOLHDL", "LDLDIRECT" in the last 72 hours. Thyroid Function Tests: No results for input(s): "TSH",  "T4TOTAL", "FREET4", "T3FREE", "THYROIDAB" in the last 72 hours. Anemia Panel: No results for input(s): "VITAMINB12", "FOLATE", "FERRITIN", "TIBC", "IRON", "RETICCTPCT" in the last 72 hours. Urine analysis:    Component Value Date/Time   COLORURINE yellow 10/10/2010 1014   APPEARANCEUR Hazy 10/10/2010 1014   LABSPEC <1.005 10/10/2010 1014   PHURINE 6.5 10/10/2010 1014   HGBUR large 10/10/2010 1014   BILIRUBINUR negative 10/10/2010 1014   UROBILINOGEN 0.2 10/10/2010 1014   NITRITE positive 10/10/2010 1014    Radiological Exams on Admission: I have personally reviewed images DG Knee Left Port  Result Date: 06/01/2022 CLINICAL DATA:  Pain after a fall. EXAM: PORTABLE LEFT KNEE - 1-2 VIEW COMPARISON:  06/27/2005 FINDINGS: Postoperative left total knee arthroplasty including patellar femoral component. Components appear well seated. No acute displaced fractures are identified. No focal bone lesion or bone destruction. No significant effusion. Soft tissues are unremarkable. Vascular calcifications. IMPRESSION: Left total knee arthroplasty. Components appear well seated. No acute displaced fractures. Electronically Signed   By: Lucienne Capers M.D.   On: 06/01/2022 22:18   DG Chest Portable 1 View  Result Date: 06/01/2022 CLINICAL DATA:  Pain after a fall. EXAM: PORTABLE CHEST 1 VIEW COMPARISON:  None Available. FINDINGS: Shallow inspiration. Cardiac enlargement. No vascular congestion, edema, or consolidation. No pleural effusions. No pneumothorax. Mediastinal contours appear intact. IMPRESSION: No active disease. Electronically Signed   By: Lucienne Capers M.D.   On: 06/01/2022 22:17   DG Ankle 2 Views Left  Result Date: 06/01/2022 CLINICAL DATA:  Pain after a fall. EXAM: LEFT ANKLE - 2 VIEW COMPARISON:  None Available. FINDINGS: Nonstandard positioning limits evaluation. As visualized, no evidence of acute fracture or dislocation. Degenerative changes are present in the intertarsal joints.  Vascular calcifications in the soft tissues. IMPRESSION: No acute fractures identified.  Degenerative changes. Electronically Signed   By: Gwyndolyn Saxon  Gerilyn Nestle M.D.   On: 06/01/2022 22:16   DG Hip Unilat W or Wo Pelvis 2-3 Views Left  Result Date: 06/01/2022 CLINICAL DATA:  Pain after a fall. EXAM: DG HIP (WITH OR WITHOUT PELVIS) 2-3V LEFT COMPARISON:  None Available. FINDINGS: Comminuted inter trochanteric fractures of the left proximal femur with displaced lesser trochanteric fragment and varus angulation of the hip. No dislocation. Pelvis and right hip appear intact. Vascular calcifications are present. IMPRESSION: Comminuted inter trochanteric fractures of the left hip with varus angulation and displacement of the lesser trochanteric fragment. Electronically Signed   By: Lucienne Capers M.D.   On: 06/01/2022 22:16   CT Cervical Spine Wo Contrast  Result Date: 06/01/2022 CLINICAL DATA:  Status post fall. EXAM: CT CERVICAL SPINE WITHOUT CONTRAST TECHNIQUE: Multidetector CT imaging of the cervical spine was performed without intravenous contrast. Multiplanar CT image reconstructions were also generated. RADIATION DOSE REDUCTION: This exam was performed according to the departmental dose-optimization program which includes automated exposure control, adjustment of the mA and/or kV according to patient size and/or use of iterative reconstruction technique. COMPARISON:  None Available. FINDINGS: Alignment: Normal. Skull base and vertebrae: No acute fracture. Chronic and degenerative changes are seen along the tip of the dens. Soft tissues and spinal canal: No prevertebral fluid or swelling. No visible canal hematoma. Disc levels: Mild endplate sclerosis and moderate to marked severity anterior osteophyte formation are seen at the levels of C2-C3, C3-C4, C4-C5, C5-C6, C6-C7 C7-T1. There is marked severity narrowing of the anterior atlantoaxial articulation. Mild to moderate severity intervertebral disc space  narrowing is seen at C2-C3, C3-C4, C4-C5, C5-C6, C6-C7 and C7-T1. Bilateral moderate to marked severity multilevel facet joint hypertrophy is noted. Upper chest: Negative. Other: None. IMPRESSION: 1. No acute fracture or subluxation in the cervical spine. 2. Mild to moderate severity multilevel degenerative changes, as described above. Electronically Signed   By: Virgina Norfolk M.D.   On: 06/01/2022 21:28   CT Head Wo Contrast  Result Date: 06/01/2022 CLINICAL DATA:  Status post fall. EXAM: CT HEAD WITHOUT CONTRAST TECHNIQUE: Contiguous axial images were obtained from the base of the skull through the vertex without intravenous contrast. RADIATION DOSE REDUCTION: This exam was performed according to the departmental dose-optimization program which includes automated exposure control, adjustment of the mA and/or kV according to patient size and/or use of iterative reconstruction technique. COMPARISON:  None Available. FINDINGS: Brain: There is mild cerebral atrophy with widening of the extra-axial spaces and ventricular dilatation. There are areas of decreased attenuation within the white matter tracts of the supratentorial brain, consistent with microvascular disease changes. Small, chronic bilateral basal ganglia lacunar infarcts are noted. Vascular: No hyperdense vessel or unexpected calcification. Skull: Normal. Negative for fracture or focal lesion. Sinuses/Orbits: No acute finding. Other: None. IMPRESSION: 1. No acute intracranial abnormality. 2. Generalized cerebral atrophy with chronic white matter small vessel ischemic changes. Electronically Signed   By: Virgina Norfolk M.D.   On: 06/01/2022 21:14    EKG: My personal interpretation of EKG shows: no EKG performed  Assessment/Plan Principal Problem:   Closed intertrochanteric fracture of hip, left, initial encounter [facility]) Active Problems:   Hypothyroidism   Essential hypertension   Uncontrolled type 2 diabetes mellitus with hyperglycemia,  with long-term current use of insulin (HCC)   CAD (coronary artery disease), native coronary artery   Elevated lipids    Assessment and Plan: * Closed intertrochanteric fracture of hip, left, initial encounter ([place]) Admit to inpatient med/surg bed. RCRI calculates 6.6% risk of  perioperative cardiac event. Keep NPO. Orthopedics has been consulted. Prn IV dilaudid for pain. Gentle IVF.    CAD (coronary artery disease), native coronary artery Stable. No chest pain. Continue imdur 30 mg, crestor 5 mg, coreg 6.25 mg bid.  Uncontrolled type 2 diabetes mellitus with hyperglycemia, with long-term current use of insulin (HCC) Chronic. Check A1c. Add SSI.  Essential hypertension Stable. Continue norvasc 5 mg qd, coreg 6.25 mg bid.   Hypothyroidism Stable. Continue synthroid 88 mcg daily.  Elevated lipids Stable.   DVT prophylaxis: SQ Heparin Code Status: Full Code Family Communication: discussed with pt and her husband  Disposition Plan: return home with HHPT vs SNF  Consults called: EDP has consulted orthopedics  Admission status: Inpatient, Med-Surg   Kristopher Oppenheim, DO [facility] 06/01/2022, 11:01 PM

## 2022-06-02 ENCOUNTER — Encounter (HOSPITAL_COMMUNITY)
Admission: EM | Disposition: A | Discharge status: Skilled Nursing Facility | Payer: Self-pay | Source: Home / Self Care | Attending: Family Medicine

## 2022-06-02 ENCOUNTER — Inpatient Hospital Stay (HOSPITAL_COMMUNITY): Payer: PPO | Admitting: Anesthesiology

## 2022-06-02 ENCOUNTER — Other Ambulatory Visit: Payer: Self-pay

## 2022-06-02 ENCOUNTER — Inpatient Hospital Stay (HOSPITAL_COMMUNITY): Payer: PPO

## 2022-06-02 ENCOUNTER — Encounter (HOSPITAL_COMMUNITY): Payer: Self-pay | Admitting: Orthopedic Surgery

## 2022-06-02 DIAGNOSIS — I25119 Atherosclerotic heart disease of native coronary artery with unspecified angina pectoris: Secondary | ICD-10-CM | POA: Diagnosis not present

## 2022-06-02 DIAGNOSIS — S72142A Displaced intertrochanteric fracture of left femur, initial encounter for closed fracture: Secondary | ICD-10-CM

## 2022-06-02 DIAGNOSIS — E1151 Type 2 diabetes mellitus with diabetic peripheral angiopathy without gangrene: Secondary | ICD-10-CM | POA: Diagnosis not present

## 2022-06-02 DIAGNOSIS — I10 Essential (primary) hypertension: Secondary | ICD-10-CM | POA: Diagnosis not present

## 2022-06-02 DIAGNOSIS — Z9889 Other specified postprocedural states: Secondary | ICD-10-CM

## 2022-06-02 HISTORY — PX: FEMUR IM NAIL: SHX1597

## 2022-06-02 LAB — GLUCOSE, CAPILLARY
Glucose-Capillary: 237 mg/dL — ABNORMAL HIGH (ref 70–99)
Glucose-Capillary: 206 mg/dL — ABNORMAL HIGH (ref 70–99)
Glucose-Capillary: 199 mg/dL — ABNORMAL HIGH (ref 70–99)
Glucose-Capillary: 248 mg/dL — ABNORMAL HIGH (ref 70–99)
Glucose-Capillary: 230 mg/dL — ABNORMAL HIGH (ref 70–99)

## 2022-06-02 LAB — SURGICAL PCR SCREEN
MRSA, PCR: NEGATIVE
Staphylococcus aureus: POSITIVE — AB

## 2022-06-02 LAB — HEMOGLOBIN A1C
Hgb A1c MFr Bld: 9.3 % — ABNORMAL HIGH (ref 4.8–5.6)
Mean Plasma Glucose: 220.21 mg/dL

## 2022-06-02 SURGERY — INSERTION, INTRAMEDULLARY ROD, FEMUR
Anesthesia: General | Site: Hip | Laterality: Left

## 2022-06-02 MED ORDER — ROCURONIUM BROMIDE 10 MG/ML (PF) SYRINGE
PREFILLED_SYRINGE | INTRAVENOUS | Status: AC
  Filled 2022-06-02: qty 10

## 2022-06-02 MED ORDER — CLOPIDOGREL BISULFATE 75 MG PO TABS
75.0000 mg | ORAL_TABLET | Freq: Every day | ORAL | Status: DC
  Administered 2022-06-03 – 2022-06-05 (×3): 75 mg via ORAL
  Filled 2022-06-02 (×3): qty 1

## 2022-06-02 MED ORDER — FENTANYL CITRATE (PF) 100 MCG/2ML IJ SOLN
25.0000 ug | INTRAMUSCULAR | Status: DC | PRN
  Administered 2022-06-02 (×2): 25 ug via INTRAVENOUS

## 2022-06-02 MED ORDER — ACETAMINOPHEN 10 MG/ML IV SOLN
1000.0000 mg | Freq: Once | INTRAVENOUS | Status: DC | PRN
  Administered 2022-06-02: 1000 mg via INTRAVENOUS

## 2022-06-02 MED ORDER — INSULIN ASPART PROT & ASPART (70-30 MIX) 100 UNIT/ML ~~LOC~~ SUSP
45.0000 [IU] | Freq: Two times a day (BID) | SUBCUTANEOUS | Status: DC
  Administered 2022-06-02 – 2022-06-05 (×6): 45 [IU] via SUBCUTANEOUS
  Filled 2022-06-02: qty 10

## 2022-06-02 MED ORDER — PROMETHAZINE HCL 25 MG/ML IJ SOLN: 6.2500 mg | INTRAMUSCULAR | Status: DC | PRN

## 2022-06-02 MED ORDER — CEFAZOLIN SODIUM-DEXTROSE 2-4 GM/100ML-% IV SOLN
2.0000 g | Freq: Three times a day (TID) | INTRAVENOUS | Status: AC
  Administered 2022-06-02 – 2022-06-03 (×2): 2 g via INTRAVENOUS
  Filled 2022-06-02 (×2): qty 100

## 2022-06-02 MED ORDER — SUGAMMADEX SODIUM 200 MG/2ML IV SOLN
INTRAVENOUS | Status: DC | PRN
  Administered 2022-06-02: 200 mg via INTRAVENOUS

## 2022-06-02 MED ORDER — PHENYLEPHRINE HCL-NACL 20-0.9 MG/250ML-% IV SOLN
INTRAVENOUS | Status: DC | PRN
  Administered 2022-06-02: 30 ug/min via INTRAVENOUS

## 2022-06-02 MED ORDER — CEFAZOLIN SODIUM-DEXTROSE 2-4 GM/100ML-% IV SOLN
2.0000 g | INTRAVENOUS | Status: AC
Start: 2022-06-02 — End: 2022-06-02
  Administered 2022-06-02: 2 g via INTRAVENOUS
  Filled 2022-06-02: qty 100

## 2022-06-02 MED ORDER — POVIDONE-IODINE 10 % EX SWAB
2.0000 | Freq: Once | CUTANEOUS | Status: AC
  Administered 2022-06-02: 2 via TOPICAL

## 2022-06-02 MED ORDER — ENOXAPARIN SODIUM 40 MG/0.4ML IJ SOSY
40.0000 mg | PREFILLED_SYRINGE | INTRAMUSCULAR | Status: DC
  Administered 2022-06-03 – 2022-06-05 (×3): 40 mg via SUBCUTANEOUS
  Filled 2022-06-02 (×3): qty 0.4

## 2022-06-02 MED ORDER — CHLORHEXIDINE GLUCONATE 0.12 % MT SOLN
OROMUCOSAL | Status: AC
  Administered 2022-06-02: 15 mL via OROMUCOSAL
  Filled 2022-06-02: qty 15

## 2022-06-02 MED ORDER — AMISULPRIDE (ANTIEMETIC) 5 MG/2ML IV SOLN: 10.0000 mg | Freq: Once | INTRAVENOUS | Status: DC | PRN

## 2022-06-02 MED ORDER — LIDOCAINE 2% (20 MG/ML) 5 ML SYRINGE
INTRAMUSCULAR | Status: DC | PRN
  Administered 2022-06-02: 40 mg via INTRAVENOUS

## 2022-06-02 MED ORDER — CHLORHEXIDINE GLUCONATE 0.12 % MT SOLN: 15.0000 mL | Freq: Once | OROMUCOSAL | Status: AC

## 2022-06-02 MED ORDER — ONDANSETRON HCL 4 MG/2ML IJ SOLN
INTRAMUSCULAR | Status: DC | PRN
  Administered 2022-06-02: 4 mg via INTRAVENOUS

## 2022-06-02 MED ORDER — FENTANYL CITRATE (PF) 100 MCG/2ML IJ SOLN
INTRAMUSCULAR | Status: AC
  Filled 2022-06-02: qty 2

## 2022-06-02 MED ORDER — FENTANYL CITRATE (PF) 250 MCG/5ML IJ SOLN
INTRAMUSCULAR | Status: DC | PRN
  Administered 2022-06-02 (×2): 50 ug via INTRAVENOUS
  Administered 2022-06-02: 25 ug via INTRAVENOUS
  Administered 2022-06-02: 50 ug via INTRAVENOUS
  Administered 2022-06-02: 25 ug via INTRAVENOUS
  Administered 2022-06-02: 50 ug via INTRAVENOUS

## 2022-06-02 MED ORDER — ACETAMINOPHEN 325 MG PO TABS: 325.0000 mg | ORAL_TABLET | Freq: Once | ORAL | Status: DC | PRN

## 2022-06-02 MED ORDER — ACETAMINOPHEN 160 MG/5ML PO SOLN: 325.0000 mg | Freq: Once | ORAL | Status: DC | PRN

## 2022-06-02 MED ORDER — TRANEXAMIC ACID-NACL 1000-0.7 MG/100ML-% IV SOLN
1000.0000 mg | INTRAVENOUS | Status: AC
  Administered 2022-06-02: 1000 mg via INTRAVENOUS
  Filled 2022-06-02: qty 100

## 2022-06-02 MED ORDER — OXYCODONE HCL 5 MG PO TABS
5.0000 mg | ORAL_TABLET | ORAL | Status: DC | PRN
  Administered 2022-06-02: 10 mg via ORAL
  Administered 2022-06-03: 5 mg via ORAL
  Administered 2022-06-03 – 2022-06-04 (×2): 10 mg via ORAL
  Administered 2022-06-04: 5 mg via ORAL
  Administered 2022-06-05 (×2): 10 mg via ORAL
  Filled 2022-06-02: qty 1
  Filled 2022-06-02 (×2): qty 2
  Filled 2022-06-02: qty 1
  Filled 2022-06-02 (×2): qty 2
  Filled 2022-06-02: qty 1
  Filled 2022-06-02: qty 2

## 2022-06-02 MED ORDER — BUPIVACAINE HCL (PF) 0.25 % IJ SOLN
INTRAMUSCULAR | Status: DC | PRN
  Administered 2022-06-02: 30 mL

## 2022-06-02 MED ORDER — FENTANYL CITRATE (PF) 250 MCG/5ML IJ SOLN
INTRAMUSCULAR | Status: AC
  Filled 2022-06-02: qty 5

## 2022-06-02 MED ORDER — ROCURONIUM BROMIDE 10 MG/ML (PF) SYRINGE
PREFILLED_SYRINGE | INTRAVENOUS | Status: DC | PRN
  Administered 2022-06-02: 50 mg via INTRAVENOUS

## 2022-06-02 MED ORDER — ONDANSETRON HCL 4 MG/2ML IJ SOLN
INTRAMUSCULAR | Status: AC
  Filled 2022-06-02: qty 2

## 2022-06-02 MED ORDER — LIDOCAINE 2% (20 MG/ML) 5 ML SYRINGE
INTRAMUSCULAR | Status: AC
  Filled 2022-06-02: qty 5

## 2022-06-02 MED ORDER — PROPOFOL 10 MG/ML IV BOLUS
INTRAVENOUS | Status: AC
Start: 2022-06-02 — End: ?
  Filled 2022-06-02: qty 20

## 2022-06-02 MED ORDER — LACTATED RINGERS IV SOLN: INTRAVENOUS | Status: DC

## 2022-06-02 MED ORDER — VANCOMYCIN HCL 500 MG IV SOLR
INTRAVENOUS | Status: AC
  Filled 2022-06-02: qty 10

## 2022-06-02 MED ORDER — ENSURE ENLIVE PO LIQD
237.0000 mL | Freq: Two times a day (BID) | ORAL | Status: DC
  Administered 2022-06-03 – 2022-06-05 (×6): 237 mL via ORAL

## 2022-06-02 MED ORDER — CHLORHEXIDINE GLUCONATE CLOTH 2 % EX PADS
6.0000 | MEDICATED_PAD | Freq: Every day | CUTANEOUS | Status: DC
Start: 2022-06-02 — End: 2022-06-05
  Administered 2022-06-04 – 2022-06-05 (×2): 6 via TOPICAL

## 2022-06-02 MED ORDER — DULAGLUTIDE 0.75 MG/0.5ML ~~LOC~~ SOAJ
0.7500 mg | SUBCUTANEOUS | Status: DC
Start: 2022-06-02 — End: 2022-06-02

## 2022-06-02 MED ORDER — DEXAMETHASONE SODIUM PHOSPHATE 10 MG/ML IJ SOLN
INTRAMUSCULAR | Status: DC | PRN
  Administered 2022-06-02: 5 mg via INTRAVENOUS

## 2022-06-02 MED ORDER — CHLORHEXIDINE GLUCONATE 4 % EX LIQD
60.0000 mL | Freq: Once | CUTANEOUS | Status: AC
  Administered 2022-06-02: 4 via TOPICAL
  Filled 2022-06-02: qty 15

## 2022-06-02 MED ORDER — TRANEXAMIC ACID-NACL 1000-0.7 MG/100ML-% IV SOLN
INTRAVENOUS | Status: AC
  Filled 2022-06-02: qty 100

## 2022-06-02 MED ORDER — SODIUM CHLORIDE 0.9 % IR SOLN
Status: DC | PRN
  Administered 2022-06-02: 1000 mL

## 2022-06-02 MED ORDER — PROPOFOL 10 MG/ML IV BOLUS
INTRAVENOUS | Status: DC | PRN
  Administered 2022-06-02: 100 mg via INTRAVENOUS

## 2022-06-02 MED ORDER — ACETAMINOPHEN 10 MG/ML IV SOLN
INTRAVENOUS | Status: AC
  Filled 2022-06-02: qty 100

## 2022-06-02 MED ORDER — BUPIVACAINE HCL (PF) 0.25 % IJ SOLN
INTRAMUSCULAR | Status: AC
  Filled 2022-06-02: qty 30

## 2022-06-02 SURGICAL SUPPLY — 53 items
BAG COUNTER SPONGE SURGICOUNT (BAG) ×2 IMPLANT
BAG SPNG CNTER NS LX DISP (BAG) ×1
BIT DRILL INTERTAN LAG SCREW (BIT) ×1 IMPLANT
BIT DRILL SHORT 4.0 (BIT) IMPLANT
CLSR STERI-STRIP ANTIMIC 1/2X4 (GAUZE/BANDAGES/DRESSINGS) ×2 IMPLANT
COVER MAYO STAND STRL (DRAPES) ×2 IMPLANT
COVER PERINEAL POST (MISCELLANEOUS) ×2 IMPLANT
COVER SURGICAL LIGHT HANDLE (MISCELLANEOUS) ×2 IMPLANT
DRAPE C-ARM 42X72 X-RAY (DRAPES) ×1 IMPLANT
DRAPE C-ARMOR (DRAPES) ×1 IMPLANT
DRAPE STERI IOBAN 125X83 (DRAPES) ×2 IMPLANT
DRILL BIT SHORT 4.0 (BIT) ×2
DRSG MEPILEX BORDER 4X4 (GAUZE/BANDAGES/DRESSINGS) ×6 IMPLANT
DRSG TEGADERM 4X4.75 (GAUZE/BANDAGES/DRESSINGS) ×3 IMPLANT
DURAPREP 26ML APPLICATOR (WOUND CARE) ×2 IMPLANT
ELECT REM PT RETURN 9FT ADLT (ELECTROSURGICAL) ×2
ELECTRODE REM PT RTRN 9FT ADLT (ELECTROSURGICAL) ×1 IMPLANT
GAUZE SPONGE 4X4 12PLY STRL (GAUZE/BANDAGES/DRESSINGS) ×1 IMPLANT
GLOVE BIO SURGEON STRL SZ7.5 (GLOVE) ×2 IMPLANT
GLOVE BIOGEL PI IND STRL 7.5 (GLOVE) ×1 IMPLANT
GLOVE BIOGEL PI IND STRL 8 (GLOVE) ×1 IMPLANT
GLOVE BIOGEL PI INDICATOR 7.5 (GLOVE) ×1
GLOVE BIOGEL PI INDICATOR 8 (GLOVE) ×1
GLOVE SURG SYN 7.5  E (GLOVE) ×2
GLOVE SURG SYN 7.5 E (GLOVE) ×1 IMPLANT
GLOVE SURG SYN 7.5 PF PI (GLOVE) ×1 IMPLANT
GOWN STRL REUS W/ TWL LRG LVL3 (GOWN DISPOSABLE) ×2 IMPLANT
GOWN STRL REUS W/ TWL XL LVL3 (GOWN DISPOSABLE) ×2 IMPLANT
GOWN STRL REUS W/TWL LRG LVL3 (GOWN DISPOSABLE) ×4
GOWN STRL REUS W/TWL XL LVL3 (GOWN DISPOSABLE) ×4
GUIDE PIN 3.2X343 (PIN) ×2
GUIDE PIN 3.2X343MM (PIN) ×4
GUIDE ROD 3.0 (MISCELLANEOUS) ×2
KIT BASIN OR (CUSTOM PROCEDURE TRAY) ×2 IMPLANT
KIT TURNOVER KIT B (KITS) ×2 IMPLANT
MANIFOLD NEPTUNE II (INSTRUMENTS) ×2 IMPLANT
NAIL LOCK CANN 10X380 130D LT (Nail) ×1 IMPLANT
NS IRRIG 1000ML POUR BTL (IV SOLUTION) ×2 IMPLANT
PACK GENERAL/GYN (CUSTOM PROCEDURE TRAY) ×2 IMPLANT
PAD ARMBOARD 7.5X6 YLW CONV (MISCELLANEOUS) ×4 IMPLANT
PIN GUIDE 3.2X343MM (PIN) IMPLANT
ROD GUIDE 3.0 (MISCELLANEOUS) IMPLANT
SCREW LAG COMPR KIT 95/90 (Screw) ×1 IMPLANT
SCREW TRIGEN LOW PROF 5.0X47.5 (Screw) ×1 IMPLANT
STRIP CLOSURE SKIN 1/2X4 (GAUZE/BANDAGES/DRESSINGS) ×2 IMPLANT
SUT MNCRL AB 4-0 PS2 18 (SUTURE) ×3 IMPLANT
SUT VIC AB 0 CT1 27 (SUTURE) ×2
SUT VIC AB 0 CT1 27XBRD ANBCTR (SUTURE) IMPLANT
SUT VIC AB 2-0 CT1 27 (SUTURE) ×2
SUT VIC AB 2-0 CT1 TAPERPNT 27 (SUTURE) ×1 IMPLANT
TOWEL GREEN STERILE (TOWEL DISPOSABLE) ×2 IMPLANT
TOWEL OR NON WOVEN STRL DISP B (DISPOSABLE) ×2 IMPLANT
WATER STERILE IRR 1000ML POUR (IV SOLUTION) ×2 IMPLANT

## 2022-06-02 NOTE — Progress Notes (Signed)
Pt received rapid acting insulin within the last 4 hours, no protocol at this time per Dr. Smith Robert.

## 2022-06-02 NOTE — ED Notes (Signed)
Patient provided with bed linen, brief, sheet, and blanket change. New pelvic binder applied. Fresh purewick applied. Perineal care performed.

## 2022-06-02 NOTE — Consult Note (Signed)
ORTHOPAEDIC CONSULTATION  REQUESTING PHYSICIAN: Willaim Sheng, MD  Chief Complaint: Left hip fracture  HPI: Danielle Murray is a 81 y.o. 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  History also of diabetes.  She had a fall yesterday resulting in pain in her left hip.  Inability to weight-bear.  Has some pain in the left knee.  Otherwise denies pain in other joints or extremities.  She had undergone left TKA with Gioffre in 2006.  Past Medical History:  Diagnosis Date   Arthritis    "fingers, all my joints" (09/03/2013)   Coronary artery disease    a. 08/2013: unstable angina s/p PTCA/DES to LAD, medical therapy for residual severe stenosis of a mid-distal left PDA branch of large dominant LCx, moderate RCA disease (consider PCI of L-PDA if she fails med rx).   DEPRESSION    Diverticulosis    Hyperlipidemia    Hypertension    HYPOTHYROIDISM    Preseptal cellulitis of right upper eyelid 03/10/2016   SVT (supraventricular tachycardia) ([place])    a. very brief transient SVT during 08/2013 admission overnight.   Type II diabetes mellitus ([place])    Dr Chalmers Cater   Past Surgical History:  Procedure Laterality Date   ABDOMINAL HYSTERECTOMY  1989   no BSO; dysfunctional menses   CATARACT EXTRACTION W/ INTRAOCULAR LENS  IMPLANT, BILATERAL Bilateral 2014   CATARACT EXTRACTION W/PHACO Right 07/02/2013   CATARACT EXTRACTION W/PHACO Left 06/01/2013   COLONOSCOPY  2003   Tics   CORONARY ANGIOPLASTY WITH STENT PLACEMENT  09/03/2013   "1" (09/03/2013)   LEFT HEART CATHETERIZATION WITH CORONARY ANGIOGRAM N/A 09/03/2013   Procedure: LEFT HEART CATHETERIZATION WITH CORONARY ANGIOGRAM;  Surgeon: Blane Ohara, MD;  Location: [facility] CATH [facility];  Service: Cardiovascular;  Laterality: N/A;   TOTAL KNEE ARTHROPLASTY Left 06/2005   Dr Gladstone Lighter   Social History   Socioeconomic History   Marital status: Married    Spouse name: Not on file   Number of children: Not on file   Years of  education: Not on file   Highest education level: Not on file  Occupational History   Not on file  Tobacco Use   Smoking status: Never   Smokeless tobacco: Never  Vaping Use   Vaping Use: Never used  Substance and Sexual Activity   Alcohol use: Yes    Comment: 09/03/2013 "1-2 times/yr"   Drug use: No   Sexual activity: Yes  Other Topics Concern   Not on file  Social History Narrative   Not on file   Social Determinants of Health   Financial Resource Strain: Not on file  Food Insecurity: Not on file  Transportation Needs: Not on file  Physical Activity: Not on file  Stress: Not on file  Social Connections: Not on file   Family History  Problem Relation Age of Onset   Asthma Mother    Hypertension Mother    Heart failure Mother    Hypertension Father    Stroke Father 8   Heart attack Father 70   Hypertension Sister    Hyperlipidemia Sister    Heart attack Sister        pre 3; Twin   Diabetes Sister        her TWIN   Hypertension Brother    Hyperlipidemia Brother    Coronary artery disease Brother        stents late 50s   Breast cancer Neg Hx    Allergies  Allergen Reactions   Penicillins     Diffuse joint pain @ age 80 in context of Scarlet Fever     Positive ROS: All other systems have been reviewed and were otherwise negative with the exception of those mentioned in the HPI and as above.  Physical Exam: General: Alert, no acute distress Cardiovascular: No pedal edema Respiratory: No cyanosis, no use of accessory musculature Skin: No lesions in the area of chief complaint Neurologic: Sensation intact distally Psychiatric: Patient is competent for consent with normal mood and affect  MUSCULOSKELETAL:  LLE No traumatic wounds, ecchymosis, or rash  Nontender  Hip range of motion deferred given known fracture  No knee or ankle effusion  Sens DPN, SPN, TN intact  Motor EHL, ext, flex 5/5  DP 2+, PT 2+, No significant edema   RLE No traumatic  wounds, ecchymosis, or rash  Nontender  No groin pain with log roll  No knee or ankle effusion  Knee stable to varus/ valgus stress  Sens DPN, SPN, TN intact  Motor EHL, ext, flex 5/5  DP 2+, PT 2+, No significant edema   IMAGING: X-rays of the left hip left knee reviewed demonstrate displaced intertrochanteric left femur fracture.  Total arthroplasty intact without adverse features.  Assessment: Principal Problem:   Closed intertrochanteric fracture of hip, left, initial encounter [facility]) Active Problems:   Hypothyroidism   Elevated lipids   Essential hypertension   Uncontrolled type 2 diabetes mellitus with hyperglycemia, with long-term current use of insulin (HCC)   CAD (coronary artery disease), native coronary artery   Status post surgery   Left intertrochanteric femur fracture  Plan: Discussed plan for fixation of her fracture with a cephalomedullary nail.  Given history of diabetes discussed importance of good glycemic control postoperatively to help reduce her risk of infection and wound healing problems which she is at elevated risk for.  Patient has been n.p.o. since midnight.   The risks benefits and alternatives were discussed with the patient including but not limited to the risks of nonoperative treatment, versus surgical intervention including infection, bleeding, nerve injury, malunion, nonunion, the need for revision surgery, hardware prominence, hardware failure, the need for hardware removal, blood clots, cardiopulmonary complications, morbidity, mortality, among others, and they were willing to proceed.      Willaim Sheng, MD  Contact information:   [id number] 7am-5pm epic message Dr. Zachery Dakins, or call office for patient follow up: [phone] After hours and holidays please check [email or web address] for group call information for [facility]

## 2022-06-02 NOTE — Progress Notes (Signed)
Initial Nutrition Assessment  DOCUMENTATION CODES:   Not applicable  INTERVENTION:  Encourage adequate PO intake Ensure Enlive po BID, each supplement provides 350 kcal and 20 grams of protein.   NUTRITION DIAGNOSIS:   Increased nutrient needs related to post-op healing, hip fracture as evidenced by estimated needs.  GOAL:   Patient will meet greater than or equal to 90% of their needs  MONITOR:   Supplement acceptance, Labs, Weight trends, PO intake  REASON FOR ASSESSMENT:   Consult Assessment of nutrition requirement/status, Hip fracture protocol  ASSESSMENT:   Pt admitted from home with L intertrochanteric hip fx after a fall. PMH significant for T2DM, CAD, and hypothyroidism.   Spoke with pt's husband via phone call to room. He states that surgery went well today and she is currently eating chicken soup. PTA she was eating well. They typically eats 3 meals per day where lunch is light and dinner is their biggest meal of the day. Denies difficulty chewing or swallowing. She has had slightly restricted mobility d/t ankle swelling but has canes and walkers for mobility assistance.   Pt's husband denies recent wt loss and reports her wt has remained fairly stable. Limited documentation of wt history within the last year. Wt 05/02/21 was 86.2 kg. Current admit wt noted to be 83.9 kg. This is not a significant wt loss for time frame.   Briefly discussed importance of adequate protein/nutritional intake to aid in hip repair and post op healing. He is agreeable to Ensure during admission.   Medications: colace, dulaglutide weekly, SSI 0-15 units TID, SSI o-5 units qhs, humulin 70/30 45 units BID, synthroid, IV abx  Labs: BUN 27, Cr 1.22, GFR 45, HgbA1c 9.3%, CBG's 199-248 x24 hours   NUTRITION - FOCUSED PHYSICAL EXAM: RD working remotely. Deferred to follow up.   Diet Order:   Diet Order             Diet Carb Modified Fluid consistency: Thin; Room service appropriate? Yes   Diet effective now                   EDUCATION NEEDS:   Education needs have been addressed  Skin:  Skin Assessment: Skin Integrity Issues: Skin Integrity Issues:: Incisions Incisions: L hip (closed)  Last BM:  PTA  Height:   Ht Readings from Last 1 Encounters:  06/02/22 '5\' 4"'$  (1.626 m)    Weight:   Wt Readings from Last 1 Encounters:  06/02/22 83.9 kg   BMI:  Body mass index is 31.76 kg/m.  Estimated Nutritional Needs:   Kcal:  1600-1800  Protein:  75-90g  Fluid:  >/=1.6L  Clayborne Dana, RDN, LDN Clinical Nutrition

## 2022-06-02 NOTE — Progress Notes (Signed)
PROGRESS NOTE   Danielle Murray  VZC:[id number] DOB: 1941/01/23 DOA: 06/01/2022 PCP: [name], MD  Brief Narrative:  81 year old community dwelling Known CAD with PTCA DES to LAD 09/03/2013--last Myoview 04/12/2017 EF 68% small area of apical ischemia EF 55-60% 10/2020 HTN DM TY 2 with nonproliferative diabetic retinopathy Isolated SVT  Was at home rushing to the restroom slipped on a tile and fell striking her head-noted shortening and rotation of left lower extremity given fentanyl 150 placed in c-collar  X-ray showed intertrochanteric fracture left hip orthopedics consulted--x-rays of neck ankle and left knee were negative Labs seem to be in keeping with prior values RCR 6.6 indicating she is clear for surgery  Hospital-Problem based course  Left hip fracture status post left IM nail 7/22 Dr. Zachery Dakins Pain is not controlled it is about 7/10-give oral Oxy IR 5-10 every 4 as needed for moderate pain and may give Dilaudid DVT prophylaxis, postoperative weightbearing precautions etc. as per Ortho Prior CAD 2014 last EF 55-60% HTN compensated heart failure Prior to admission not on aspirin-continue Plavix probably on 7/23 Continue Coreg 6.25 twice daily, Imdur 30 daily as well as Crestor 5 does not seem to have any acuteDecompensation of heart failure' \\DM'$  TY 2 Patient was n.p.o. for procedure resumed diet-sugars 200-2 50-only on sliding scale Resuming Trulicity 1.28, resume 78/67 insulin.  Drop the dose from 45 to 30 units twice daily  DVT prophylaxis: Lovenox Code Status: Full Family Communication: Discussed with patient's husband as well as with the patient's son at the bedside Disposition:  Status is: Inpatient Remains inpatient appropriate because:   Needs therapy eval and consideration for placement   Consultants:  Orthopedics  Procedures: Perioperative  Antimicrobials: None   Subjective: Awake coherent no distress pain is 7/10 no icterus no  pallor  Objective: Vitals:   06/02/22 0155 06/02/22 0246 06/02/22 0600 06/02/22 0728  BP: (!) 162/58  (!) 147/39 (!) 162/75  Pulse: 82  77 72  Resp: '17  19 18  '$ Temp:  98.6 F (37 C)  98 F (36.7 C)  TempSrc:  Oral    SpO2: 94%  (!) 88% 92%  Weight:      Height:       No intake or output data in the 24 hours ending 06/02/22 0747 Filed Weights   06/01/22 2044  Weight: 81.6 kg    Examination:  Awake coherent no distress S1-S2 no murmur telemetry benign CTA B no added sound rales rhonchi Chest clinically clear no wheeze ROM intact Slightly antalgic to the left hip  Data Reviewed: personally reviewed   CBC    Component Value Date/Time   WBC 10.8 (H) 06/01/2022 2037   RBC 4.94 06/01/2022 2037   HGB 14.4 06/01/2022 2037   HCT 43.4 06/01/2022 2037   PLT 298 06/01/2022 2037   MCV 87.9 06/01/2022 2037   MCH 29.1 06/01/2022 2037   MCHC 33.2 06/01/2022 2037   RDW 12.9 06/01/2022 2037   LYMPHSABS 0.6 (L) 06/01/2022 2037   MONOABS 0.5 06/01/2022 2037   EOSABS 0.0 06/01/2022 2037   BASOSABS 0.1 06/01/2022 2037      Latest Ref Rng & Units 06/01/2022    8:37 PM 10/31/2018    8:27 AM 08/04/2018    9:36 AM  CMP  Glucose 70 - 99 mg/dL 269   167   BUN 8 - 23 mg/dL 27   23   Creatinine 0.44 - 1.00 mg/dL 1.22   1.23   Sodium 135 - 145 mmol/L  135   136   Potassium 3.5 - 5.1 mmol/L 3.9   4.9   Chloride 98 - 111 mmol/L 102   94   CO2 22 - 32 mmol/L 22   22   Calcium 8.9 - 10.3 mg/dL 9.1   9.8   Total Protein 6.0 - 8.5 g/dL  6.5    Total Bilirubin 0.0 - 1.2 mg/dL  0.6    Alkaline Phos 39 - 117 IU/L  75    AST 0 - 40 IU/L  10    ALT 0 - 32 IU/L  8       Radiology Studies: DG Knee Left Port  Result Date: 06/01/2022 CLINICAL DATA:  Pain after a fall. EXAM: PORTABLE LEFT KNEE - 1-2 VIEW COMPARISON:  06/27/2005 FINDINGS: Postoperative left total knee arthroplasty including patellar femoral component. Components appear well seated. No acute displaced fractures are identified.  No focal bone lesion or bone destruction. No significant effusion. Soft tissues are unremarkable. Vascular calcifications. IMPRESSION: Left total knee arthroplasty. Components appear well seated. No acute displaced fractures. Electronically Signed   By: Lucienne Capers M.D.   On: 06/01/2022 22:18   DG Chest Portable 1 View  Result Date: 06/01/2022 CLINICAL DATA:  Pain after a fall. EXAM: PORTABLE CHEST 1 VIEW COMPARISON:  None Available. FINDINGS: Shallow inspiration. Cardiac enlargement. No vascular congestion, edema, or consolidation. No pleural effusions. No pneumothorax. Mediastinal contours appear intact. IMPRESSION: No active disease. Electronically Signed   By: Lucienne Capers M.D.   On: 06/01/2022 22:17   DG Ankle 2 Views Left  Result Date: 06/01/2022 CLINICAL DATA:  Pain after a fall. EXAM: LEFT ANKLE - 2 VIEW COMPARISON:  None Available. FINDINGS: Nonstandard positioning limits evaluation. As visualized, no evidence of acute fracture or dislocation. Degenerative changes are present in the intertarsal joints. Vascular calcifications in the soft tissues. IMPRESSION: No acute fractures identified.  Degenerative changes. Electronically Signed   By: Lucienne Capers M.D.   On: 06/01/2022 22:16   DG Hip Unilat W or Wo Pelvis 2-3 Views Left  Result Date: 06/01/2022 CLINICAL DATA:  Pain after a fall. EXAM: DG HIP (WITH OR WITHOUT PELVIS) 2-3V LEFT COMPARISON:  None Available. FINDINGS: Comminuted inter trochanteric fractures of the left proximal femur with displaced lesser trochanteric fragment and varus angulation of the hip. No dislocation. Pelvis and right hip appear intact. Vascular calcifications are present. IMPRESSION: Comminuted inter trochanteric fractures of the left hip with varus angulation and displacement of the lesser trochanteric fragment. Electronically Signed   By: Lucienne Capers M.D.   On: 06/01/2022 22:16   CT Cervical Spine Wo Contrast  Result Date: 06/01/2022 CLINICAL  DATA:  Status post fall. EXAM: CT CERVICAL SPINE WITHOUT CONTRAST TECHNIQUE: Multidetector CT imaging of the cervical spine was performed without intravenous contrast. Multiplanar CT image reconstructions were also generated. RADIATION DOSE REDUCTION: This exam was performed according to the departmental dose-optimization program which includes automated exposure control, adjustment of the mA and/or kV according to patient size and/or use of iterative reconstruction technique. COMPARISON:  None Available. FINDINGS: Alignment: Normal. Skull base and vertebrae: No acute fracture. Chronic and degenerative changes are seen along the tip of the dens. Soft tissues and spinal canal: No prevertebral fluid or swelling. No visible canal hematoma. Disc levels: Mild endplate sclerosis and moderate to marked severity anterior osteophyte formation are seen at the levels of C2-C3, C3-C4, C4-C5, C5-C6, C6-C7 C7-T1. There is marked severity narrowing of the anterior atlantoaxial articulation. Mild to moderate severity intervertebral  disc space narrowing is seen at C2-C3, C3-C4, C4-C5, C5-C6, C6-C7 and C7-T1. Bilateral moderate to marked severity multilevel facet joint hypertrophy is noted. Upper chest: Negative. Other: None. IMPRESSION: 1. No acute fracture or subluxation in the cervical spine. 2. Mild to moderate severity multilevel degenerative changes, as described above. Electronically Signed   By: Virgina Norfolk M.D.   On: 06/01/2022 21:28   CT Head Wo Contrast  Result Date: 06/01/2022 CLINICAL DATA:  Status post fall. EXAM: CT HEAD WITHOUT CONTRAST TECHNIQUE: Contiguous axial images were obtained from the base of the skull through the vertex without intravenous contrast. RADIATION DOSE REDUCTION: This exam was performed according to the departmental dose-optimization program which includes automated exposure control, adjustment of the mA and/or kV according to patient size and/or use of iterative reconstruction  technique. COMPARISON:  None Available. FINDINGS: Brain: There is mild cerebral atrophy with widening of the extra-axial spaces and ventricular dilatation. There are areas of decreased attenuation within the white matter tracts of the supratentorial brain, consistent with microvascular disease changes. Small, chronic bilateral basal ganglia lacunar infarcts are noted. Vascular: No hyperdense vessel or unexpected calcification. Skull: Normal. Negative for fracture or focal lesion. Sinuses/Orbits: No acute finding. Other: None. IMPRESSION: 1. No acute intracranial abnormality. 2. Generalized cerebral atrophy with chronic white matter small vessel ischemic changes. Electronically Signed   By: Virgina Norfolk M.D.   On: 06/01/2022 21:14     Scheduled Meds:  amLODipine  5 mg Oral Daily   carvedilol  6.25 mg Oral BID WC   docusate sodium  100 mg Oral BID   heparin  5,000 Units Subcutaneous Q8H   insulin aspart  0-15 Units Subcutaneous TID WC   insulin aspart  0-5 Units Subcutaneous QHS   isosorbide mononitrate  30 mg Oral Daily   levothyroxine  88 mcg Oral QHS   rosuvastatin  5 mg Oral Daily   Continuous Infusions:  lactated ringers 50 mL/hr at 06/02/22 0619     LOS: 1 day   Time spent: [place], MD [facility] To contact the attending provider between 7A-7P or the covering provider during after hours 7P-7A, please log into the web site [email or web address] and access using universal [place] password for that web site. If you do not have the password, please call the hospital operator.  06/02/2022, 7:47 AM

## 2022-06-02 NOTE — Anesthesia Postprocedure Evaluation (Signed)
Anesthesia Post Note  Patient: Danielle Murray  Procedure(s) Performed: INTRAMEDULLARY (IM) NAIL FEMORAL (Left: Hip)     Patient location during evaluation: PACU Anesthesia Type: General Level of consciousness: awake and alert Pain management: pain level controlled Vital Signs Assessment: post-procedure vital signs reviewed and stable Respiratory status: spontaneous breathing, nonlabored ventilation, respiratory function stable and patient connected to nasal cannula oxygen Cardiovascular status: blood pressure returned to baseline and stable Postop Assessment: no apparent nausea or vomiting Anesthetic complications: no   No notable events documented.  Last Vitals:  Vitals:   06/02/22 1445 06/02/22 1517  BP:  (!) 147/52  Pulse: 70   Resp: 14   Temp: 37 C   SpO2: 93%     Last Pain:  Vitals:   06/02/22 1635  TempSrc:   PainSc: 2                  Effie Berkshire

## 2022-06-02 NOTE — Anesthesia Procedure Notes (Signed)
Procedure Name: Intubation Date/Time: 06/02/2022 11:22 AM  Performed by: Griffin Dakin, CRNAPre-anesthesia Checklist: Patient identified, Emergency Drugs available, Suction available and Patient being monitored Patient Re-evaluated:Patient Re-evaluated prior to induction Oxygen Delivery Method: Circle system utilized Preoxygenation: Pre-oxygenation with 100% oxygen Induction Type: IV induction Ventilation: Mask ventilation without difficulty Laryngoscope Size: Glidescope and 3 Grade View: Grade I Tube type: Oral Tube size: 7.0 mm Number of attempts: 1 Airway Equipment and Method: Stylet and Oral airway Placement Confirmation: ETT inserted through vocal cords under direct vision, positive ETCO2 and breath sounds checked- equal and bilateral Secured at: 23 cm Tube secured with: Tape Dental Injury: Teeth and Oropharynx as per pre-operative assessment

## 2022-06-02 NOTE — Discharge Instructions (Signed)
Orthopedic Discharge Instructions  Diet: As you were doing prior to hospitalization   Shower:  May shower but keep the wounds dry, use an occlusive plastic wrap, NO SOAKING IN TUB.  If the bandage gets wet, change with a clean dry gauze.  If you have a splint on, leave the splint in place and keep the splint dry with a plastic bag.  Dressing:  You may change your dressing 3-5 days after surgery, unless you have a splint.  If the dressing remains clean and dry it can also be left on until follow up. If you change the dressing replace with clean gauze and tape or ace wrap. If you have a splint, then just leave the splint in place and we will change your bandages during your first follow-up appointment.  If water gets in the splint or the splint gets saturated please call the clinic and we can see you to change your splint.  If you had hand or foot surgery, we will plan to remove your stitches in about 2 weeks in the office.  For all other surgeries, there are sticky tapes (steri-strips) on your wounds and all the stitches are absorbable.  Leave the steri-strips in place when changing your dressings, they will peel off with time, usually 2-3 weeks.  Activity:  Increase activity slowly as tolerated, but follow the weight bearing instructions below.  The rules on driving is that you can not be taking narcotics while you drive, and you must feel in control of the vehicle.    Weight Bearing:   weight bearing as tolerated with a walker  Blood clot prevention (DVT Prophylaxis): After surgery you are at an increased risk for a blood clot. you were prescribed a blood thinner, lovenox '40mg'$ , to be taken once daily for a total of 4 weeks from surgery to help reduce your risk of getting a blood clot. This will help prevent a blood clot. Signs of a pulmonary embolus (blood clot in the lungs) include sudden short of breath, feeling lightheaded or dizzy, chest pain with a deep breath, rapid pulse rapid breathing. Signs  of a blood clot in your arms or legs include new unexplained swelling and cramping, warm, red or darkened skin around the painful area. Please call the office or 911 right away if these signs or symptoms develop. To prevent constipation: you may use a stool softener such as -  Colace (over the counter) 100 mg by mouth twice a day  Drink plenty of fluids (prune juice may be helpful) and high fiber foods Miralax (over the counter) for constipation as needed.    Itching:  If you experience itching with your medications, try taking only a single pain pill, or even half a pain pill at a time.  You may take up to 10 pain pills per day, and you can also use benadryl over the counter for itching or also to help with sleep.   Precautions:  If you experience chest pain or shortness of breath - call 911 immediately for transfer to the hospital emergency department!!   Call office [phone]) for the following: Temperature greater than 101F Persistent nausea and vomiting Severe uncontrolled pain Redness, tenderness, or signs of infection (pain, swelling, redness, odor or green/yellow discharge around the site) Difficulty breathing, headache or visual disturbances Hives Persistent dizziness or light-headedness Extreme fatigue Any other questions or concerns you may have after discharge  In an emergency, call 911 or go to an Emergency Department at a nearby hospital  Follow- Up Appointment:  Please call for an appointment to be seen approximately 2-3 week after surgery in [facility] with your surgeon Dr. Charlies Constable - [phone] Address: [address] [place], [place],  [address]

## 2022-06-02 NOTE — Transfer of Care (Signed)
Immediate Anesthesia Transfer of Care Note  Patient: Anita Laguna Berntsen  Procedure(s) Performed: INTRAMEDULLARY (IM) NAIL FEMORAL (Left: Hip)  Patient Location: PACU  Anesthesia Type: General  Level of Consciousness: drowsy  Airway & Oxygen Therapy: Patient Spontanous Breathing and Patient connected to face mask oxygen  Post-op Assessment: Report given to RN and Post -op Vital signs reviewed and stable  Post vital signs: Reviewed and stable  Last Vitals:  Vitals Value Taken Time  BP 179/56 06/02/22 1320  Temp    Pulse 81 06/02/22 1324  Resp 19 06/02/22 1324  SpO2 91 % 06/02/22 1324  Vitals shown include unvalidated device data.  Last Pain:  Vitals:   06/02/22 0929  TempSrc: Oral  PainSc:          Complications: No notable events documented.

## 2022-06-02 NOTE — Op Note (Signed)
DATE OF SURGERY:  06/02/2022  TIME: 1:17 PM  PATIENT NAME:  Danielle Murray  AGE: 81 y.o.  PRE-OPERATIVE DIAGNOSIS:  Left intertoch femur fracture  POST-OPERATIVE DIAGNOSIS:  SAME  PROCEDURE:  INTRAMEDULLARY (IM) NAIL FEMORAL  SURGEON:  Brenan Modesto A Kwame [name]  ASSISTANT:  none  OPERATIVE IMPLANTS:  [employer] Intertan Nail 10 x 380 mm , 95/90 lag compression screw, 1 distal interlock  Implant Name Type Inv. Item Serial No. Manufacturer Lot No. LRB No. Used Action  NAIL LOCK CANN 10X380 130D LT - [id number] Nail NAIL LOCK CANN 10X380 130D LT  [employer] [id number] Left 1 Implanted  SCREW LAG COMPR KIT 95/90 - [id number] Screw SCREW LAG COMPR KIT 95/90  [employer] [id number] Left 1 Implanted  SCREW TRIGEN LOW PROF 5.0X47.5 - [id number] Screw SCREW TRIGEN LOW PROF 5.0X47.5  [employer] [id number] Left 1 Implanted    ESTIMATED BLOOD LOSS: 200cc  PREOPERATIVE INDICATIONS:  Danielle Murray is a 81 y.o. year old who fell and suffered an Left intertoch femur fracture. She was brought into the ER and then admitted and optimized and then elected for surgical intervention.    The risks benefits and alternatives were discussed with the patient including but not limited to the risks of nonoperative treatment, versus surgical intervention including infection, bleeding, nerve injury, malunion, nonunion, hardware prominence, hardware failure, need for hardware removal, blood clots, cardiopulmonary complications, morbidity, mortality, among others, and they were willing to proceed.    OPERATIVE PROCEDURE:  The patient was brought to the operating room and placed in the supine position. Anesthesia was administered. She was placed on the fracture table.  Closed reduction was performed under C-arm guidance.  Time out was then performed after sterile prep and drape. She received preoperative antibiotics.  Small incision proximal to the greater  trochanter was made and carried down through skin and subcutaneous tissue.  Threaded guidewire was directed at the tip of the greater trochanter and advanced into the proximal metaphysis.  Positioning was confirmed with fluoroscopy.  I then used an entry reamer to enter the medullary canal.  Guidewire was advanced just distal to the anterior flange of total knee arthroplasty.  Good position was confirmed laterally to make sure were not to anterior distally.  We measured 380 mm distally just past the anterior flange.  We sequentially reamed up to 11.5 mm which had good chatter.  I then passed a 10 x 380 mm InterTAN down the center of the canal attached to the targeting arm.  I then used the targeting arm to make a percutaneous incision and directed a threaded guidewire up into the head/neck segment.  I confirmed adequate tip apex distance and measured the length.  I decided to place a 95 mm screw.  I then drilled the path for the compression screw and placed an antirotation bar.  I then placed the lag screw and then placed the compression screw and compressed approximately 5 mm.  The proximal portion of the nail was statically locked.   I then used perfect circle technique to place 1 distal interlock.  Final fluoroscopic imaging was obtained.    The wounds were irrigated copiously, and vancomycin powder was placed in the wounds.  The gluteal fascia was closed with 0 Vicryl, and skin was closed with 2-0 Vicryl and 3-0 Monocryl.  Sterile dressing was applied with Dermabond, 4 x 4 gauze, and Tegaderm.  The patient was awakened and returned to PACU in  stable and satisfactory condition. There were no complications and the patient tolerated the procedure well.   Post op recs: WB: WBAT LLE Abx: ancef x23 hours post op Imaging: PACU xrays Dressing: keep intact until follow up, change PRN if soiled or saturated. DVT prophylaxis: lovenox starting POD1 x4 weeks Follow up: 2 weeks after surgery for a wound check  with Dr. Zachery Dakins at [facility].  Address: [address], [place], [place] [address]  Office Phone: [phone]  Charlies Constable, MD Orthopaedic Surgery

## 2022-06-02 NOTE — Anesthesia Preprocedure Evaluation (Addendum)
Anesthesia Evaluation  Patient identified by MRN, date of birth, ID band Patient awake    Reviewed: Allergy & Precautions, NPO status , Patient's Chart, lab work & pertinent test results  Airway Mallampati: III  TM Distance: >3 FB Neck ROM: Full    Dental  (+) Missing,    Pulmonary neg pulmonary ROS,    breath sounds clear to auscultation       Cardiovascular hypertension, + angina + CAD, + Cardiac Stents and + Peripheral Vascular Disease   Rhythm:Regular Rate:Normal  Echo: 1. Left ventricular ejection fraction, by estimation, is 55 to 60%. The  left ventricle has normal function. The left ventricle has no regional  wall motion abnormalities. Left ventricular diastolic parameters are  consistent with Grade II diastolic  dysfunction (pseudonormalization).  2. Right ventricular systolic function is normal. The right ventricular  size is normal.  3. The mitral valve is grossly normal. No evidence of mitral valve  regurgitation.  4. The aortic valve was not well visualized. Aortic valve regurgitation  is not visualized. No aortic stenosis is present.  5. The inferior vena cava is normal in size with greater than 50%  respiratory variability, suggesting right atrial pressure of 3 mmHg.    Neuro/Psych PSYCHIATRIC DISORDERS Depression negative neurological ROS     GI/Hepatic negative GI ROS, Neg liver ROS,   Endo/Other  diabetesHypothyroidism   Renal/GU negative Renal ROS     Musculoskeletal  (+) Arthritis ,   Abdominal Normal abdominal exam  (+)   Peds  Hematology   Anesthesia Other Findings   Reproductive/Obstetrics                            Anesthesia Physical Anesthesia Plan  ASA: 3  Anesthesia Plan: General   Post-op Pain Management:    Induction: Intravenous  PONV Risk Score and Plan: 4 or greater and Ondansetron and Treatment may vary due to age or medical  condition  Airway Management Planned: Oral ETT  Additional Equipment: None  Intra-op Plan:   Post-operative Plan: Extubation in OR  Informed Consent: I have reviewed the patients History and Physical, chart, labs and discussed the procedure including the risks, benefits and alternatives for the proposed anesthesia with the patient or authorized representative who has indicated his/her understanding and acceptance.     Dental advisory given  Plan Discussed with: CRNA  Anesthesia Plan Comments:         Anesthesia Quick Evaluation

## 2022-06-03 ENCOUNTER — Encounter (HOSPITAL_COMMUNITY): Payer: Self-pay | Admitting: Orthopedic Surgery

## 2022-06-03 DIAGNOSIS — S72142A Displaced intertrochanteric fracture of left femur, initial encounter for closed fracture: Secondary | ICD-10-CM | POA: Diagnosis not present

## 2022-06-03 LAB — CBC
HCT: 29.6 % — ABNORMAL LOW (ref 36.0–46.0)
Hemoglobin: 10.1 g/dL — ABNORMAL LOW (ref 12.0–15.0)
MCH: 29.4 pg (ref 26.0–34.0)
MCHC: 34.1 g/dL (ref 30.0–36.0)
MCV: 86 fL (ref 80.0–100.0)
Platelets: 237 10*3/uL (ref 150–400)
RBC: 3.44 MIL/uL — ABNORMAL LOW (ref 3.87–5.11)
RDW: 13.2 % (ref 11.5–15.5)
WBC: 11.3 10*3/uL — ABNORMAL HIGH (ref 4.0–10.5)
nRBC: 0 % (ref 0.0–0.2)

## 2022-06-03 LAB — BASIC METABOLIC PANEL
Anion gap: 11 (ref 5–15)
BUN: 38 mg/dL — ABNORMAL HIGH (ref 8–23)
CO2: 22 mmol/L (ref 22–32)
Calcium: 8.3 mg/dL — ABNORMAL LOW (ref 8.9–10.3)
Chloride: 102 mmol/L (ref 98–111)
Creatinine, Ser: 1.38 mg/dL — ABNORMAL HIGH (ref 0.44–1.00)
GFR, Estimated: 39 mL/min — ABNORMAL LOW (ref >60)
Glucose, Bld: 172 mg/dL — ABNORMAL HIGH (ref 70–99)
Potassium: 4.3 mmol/L (ref 3.5–5.1)
Sodium: 135 mmol/L (ref 135–145)

## 2022-06-03 LAB — GLUCOSE, CAPILLARY
Glucose-Capillary: 182 mg/dL — ABNORMAL HIGH (ref 70–99)
Glucose-Capillary: 211 mg/dL — ABNORMAL HIGH (ref 70–99)
Glucose-Capillary: 216 mg/dL — ABNORMAL HIGH (ref 70–99)
Glucose-Capillary: 210 mg/dL — ABNORMAL HIGH (ref 70–99)

## 2022-06-03 MED ORDER — LEVOTHYROXINE SODIUM 88 MCG PO TABS
88.0000 ug | ORAL_TABLET | Freq: Every day | ORAL | Status: DC
Start: 2022-06-04 — End: 2022-06-05
  Administered 2022-06-04 – 2022-06-05 (×2): 88 ug via ORAL
  Filled 2022-06-03 (×2): qty 1

## 2022-06-03 NOTE — Progress Notes (Signed)
PROGRESS NOTE   Danielle Murray  ZOX:[id number] DOB: [birth year] DOA: 06/01/2022 PCP: [name], MD  Brief Narrative:  81 year old community dwelling Known CAD with PTCA DES to LAD 09/03/2013--last Myoview 04/12/2017 EF 68% small area of apical ischemia EF 55-60% 10/2020 HTN DM TY 2 with nonproliferative diabetic retinopathy Isolated SVT  Was at home rushing to the restroom slipped on a tile and fell striking her head-noted shortening and rotation of left lower extremity given fentanyl 150 placed in c-collar  X-ray showed intertrochanteric fracture left hip orthopedics consulted--x-rays of neck ankle and left knee were negative Labs seem to be in keeping with prior values RCR 6.6 indicating she is clear for surgery  Hospital-Problem based course  Left hip fracture status post left IM nail 7/22 Dr. Zachery Dakins Pain mod--controlled--oral Oxy IR 5-10 every 4 as needed for moderate pain and may give Dilaudid DVT prophylaxis, postoperative weightbearing precautions etc. as per Ortho Prior CAD 2014 last EF 55-60% HTN compensated heart failure Prior to admission not on aspirin-continue Plavix probably on 7/23 Continue Coreg 6.25 twice daily, Imdur 30 daily as well as Crestor 5 does not seem to have any acute Decompensation of heart failure DM TY 2 Patient was n.p.o. for procedure resumed diet-sugars 200-2 50-only on sliding scale resume 70/30 insulin.  Drop the dose from 45 to 30 units twice daily  DVT prophylaxis: Lovenox Code Status: Full Family Communication: Discussed with patient's husband as well as with the patient's son at the bedside Disposition:  Status is: Inpatient Remains inpatient appropriate because:   Needs therapy eval and consideration for placement   Consultants:  Orthopedics  Procedures: Perioperative  Antimicrobials: None   Subjective:  A little confused  Has some sacral discomfort no cp fever No n/v  Objective: Vitals:   06/02/22 2059 06/02/22  2100 06/03/22 0435 06/03/22 0737  BP: (!) 148/59 (!) 148/59 (!) 180/51 (!) 128/51  Pulse: 73  68 63  Resp:   16 18  Temp: 98.7 F (37.1 C) 98.7 F (37.1 C) 98.6 F (37 C) 98.2 F (36.8 C)  TempSrc: Oral Oral Oral Oral  SpO2: 93%  95% 94%  Weight:      Height:        Intake/Output Summary (Last 24 hours) at 06/03/2022 1058 Last data filed at 06/02/2022 1700 Gross per 24 hour  Intake 487.47 ml  Output 700 ml  Net -212.53 ml   Filed Weights   06/01/22 2044 06/02/22 0929  Weight: 81.6 kg 83.9 kg    Examination:  Eomi ncat no focal deficit Rom intact Cta b no added sound no wheeze nor rales Abd soft nt nd  S1 s2 no m Pscyh--mild confusion  Data Reviewed: personally reviewed   CBC    Component Value Date/Time   WBC 11.3 (H) 06/03/2022 0645   RBC 3.44 (L) 06/03/2022 0645   HGB 10.1 (L) 06/03/2022 0645   HCT 29.6 (L) 06/03/2022 0645   PLT 237 06/03/2022 0645   MCV 86.0 06/03/2022 0645   MCH 29.4 06/03/2022 0645   MCHC 34.1 06/03/2022 0645   RDW 13.2 06/03/2022 0645   LYMPHSABS 0.6 (L) 06/01/2022 2037   MONOABS 0.5 06/01/2022 2037   EOSABS 0.0 06/01/2022 2037   BASOSABS 0.1 06/01/2022 2037      Latest Ref Rng & Units 06/03/2022    6:45 AM 06/01/2022    8:37 PM 10/31/2018    8:27 AM  CMP  Glucose 70 - 99 mg/dL 172  269    BUN  8 - 23 mg/dL 38  27    Creatinine 0.44 - 1.00 mg/dL 1.38  1.22    Sodium 135 - 145 mmol/L 135  135    Potassium 3.5 - 5.1 mmol/L 4.3  3.9    Chloride 98 - 111 mmol/L 102  102    CO2 22 - 32 mmol/L 22  22    Calcium 8.9 - 10.3 mg/dL 8.3  9.1    Total Protein 6.0 - 8.5 g/dL   6.5   Total Bilirubin 0.0 - 1.2 mg/dL   0.6   Alkaline Phos 39 - 117 IU/L   75   AST 0 - 40 IU/L   10   ALT 0 - 32 IU/L   8      Radiology Studies: DG FEMUR PORT MIN 2 VIEWS LEFT  Result Date: 06/02/2022 CLINICAL DATA:  Postoperative. Closed intertrochanteric fracture left hip. EXAM: LEFT FEMUR PORTABLE 2 VIEWS COMPARISON:  Left hip radiographs 06/01/2022  FINDINGS: Interval left intramedullary nail with 2 proximal femoral neck interlocking screws fixation of the previously seen intertrochanteric fracture. There is improved alignment with resolution of the prior varus angulation. Minimal superior displacement of the distal fracture component with respect to the proximal fracture component. The lesser trochanter is again medially displaced. No evidence of hardware failure. Expected postoperative changes including lateral hip soft tissue swelling and subcutaneous air. Status post total left knee arthroplasty. Severe superior left femoroacetabular osteoarthritis. Moderate vascular calcifications. IMPRESSION: Interval ORIF of the prior left femoral intertrochanteric fracture with improved alignment. No evidence of hardware failure. Electronically Signed   By: Yvonne Kendall M.D.   On: 06/02/2022 15:06   DG FEMUR MIN 2 VIEWS LEFT  Result Date: 06/02/2022 CLINICAL DATA:  Intramedullary nail EXAM: LEFT FEMUR 2 VIEWS; DG C-ARM 1-60 MIN-NO REPORT COMPARISON:  05/31/2022 FLUOROSCOPY: Air kerma 45.49 mGy FINDINGS: Intraoperative fluoroscopic images of the left femur demonstrate intramedullary nail fixation of intratrochanteric fractures of the left femur. Near anatomic alignment of fracture fragments. No obvious perihardware fracture or component malpositioning. Pre-existing left knee arthroplasty. IMPRESSION: Intraoperative fluoroscopic images of the left femur demonstrate intramedullary nail fixation of intratrochanteric fractures of the left femur. Near anatomic alignment of fracture fragments. No obvious perihardware fracture or component malpositioning. Electronically Signed   By: Delanna Ahmadi M.D.   On: 06/02/2022 13:21   DG C-Arm 1-60 Min-No Report  Result Date: 06/02/2022 Fluoroscopy was utilized by the requesting physician.  No radiographic interpretation.   DG C-Arm 1-60 Min-No Report  Result Date: 06/02/2022 Fluoroscopy was utilized by the requesting  physician.  No radiographic interpretation.   DG Knee Left Port  Result Date: 06/01/2022 CLINICAL DATA:  Pain after a fall. EXAM: PORTABLE LEFT KNEE - 1-2 VIEW COMPARISON:  06/27/2005 FINDINGS: Postoperative left total knee arthroplasty including patellar femoral component. Components appear well seated. No acute displaced fractures are identified. No focal bone lesion or bone destruction. No significant effusion. Soft tissues are unremarkable. Vascular calcifications. IMPRESSION: Left total knee arthroplasty. Components appear well seated. No acute displaced fractures. Electronically Signed   By: Lucienne Capers M.D.   On: 06/01/2022 22:18   DG Chest Portable 1 View  Result Date: 06/01/2022 CLINICAL DATA:  Pain after a fall. EXAM: PORTABLE CHEST 1 VIEW COMPARISON:  None Available. FINDINGS: Shallow inspiration. Cardiac enlargement. No vascular congestion, edema, or consolidation. No pleural effusions. No pneumothorax. Mediastinal contours appear intact. IMPRESSION: No active disease. Electronically Signed   By: Lucienne Capers M.D.   On: 06/01/2022 22:17  DG Ankle 2 Views Left  Result Date: 06/01/2022 CLINICAL DATA:  Pain after a fall. EXAM: LEFT ANKLE - 2 VIEW COMPARISON:  None Available. FINDINGS: Nonstandard positioning limits evaluation. As visualized, no evidence of acute fracture or dislocation. Degenerative changes are present in the intertarsal joints. Vascular calcifications in the soft tissues. IMPRESSION: No acute fractures identified.  Degenerative changes. Electronically Signed   By: Lucienne Capers M.D.   On: 06/01/2022 22:16   DG Hip Unilat W or Wo Pelvis 2-3 Views Left  Result Date: 06/01/2022 CLINICAL DATA:  Pain after a fall. EXAM: DG HIP (WITH OR WITHOUT PELVIS) 2-3V LEFT COMPARISON:  None Available. FINDINGS: Comminuted inter trochanteric fractures of the left proximal femur with displaced lesser trochanteric fragment and varus angulation of the hip. No dislocation. Pelvis  and right hip appear intact. Vascular calcifications are present. IMPRESSION: Comminuted inter trochanteric fractures of the left hip with varus angulation and displacement of the lesser trochanteric fragment. Electronically Signed   By: Lucienne Capers M.D.   On: 06/01/2022 22:16   CT Cervical Spine Wo Contrast  Result Date: 06/01/2022 CLINICAL DATA:  Status post fall. EXAM: CT CERVICAL SPINE WITHOUT CONTRAST TECHNIQUE: Multidetector CT imaging of the cervical spine was performed without intravenous contrast. Multiplanar CT image reconstructions were also generated. RADIATION DOSE REDUCTION: This exam was performed according to the departmental dose-optimization program which includes automated exposure control, adjustment of the mA and/or kV according to patient size and/or use of iterative reconstruction technique. COMPARISON:  None Available. FINDINGS: Alignment: Normal. Skull base and vertebrae: No acute fracture. Chronic and degenerative changes are seen along the tip of the dens. Soft tissues and spinal canal: No prevertebral fluid or swelling. No visible canal hematoma. Disc levels: Mild endplate sclerosis and moderate to marked severity anterior osteophyte formation are seen at the levels of C2-C3, C3-C4, C4-C5, C5-C6, C6-C7 C7-T1. There is marked severity narrowing of the anterior atlantoaxial articulation. Mild to moderate severity intervertebral disc space narrowing is seen at C2-C3, C3-C4, C4-C5, C5-C6, C6-C7 and C7-T1. Bilateral moderate to marked severity multilevel facet joint hypertrophy is noted. Upper chest: Negative. Other: None. IMPRESSION: 1. No acute fracture or subluxation in the cervical spine. 2. Mild to moderate severity multilevel degenerative changes, as described above. Electronically Signed   By: Virgina Norfolk M.D.   On: 06/01/2022 21:28   CT Head Wo Contrast  Result Date: 06/01/2022 CLINICAL DATA:  Status post fall. EXAM: CT HEAD WITHOUT CONTRAST TECHNIQUE: Contiguous  axial images were obtained from the base of the skull through the vertex without intravenous contrast. RADIATION DOSE REDUCTION: This exam was performed according to the departmental dose-optimization program which includes automated exposure control, adjustment of the mA and/or kV according to patient size and/or use of iterative reconstruction technique. COMPARISON:  None Available. FINDINGS: Brain: There is mild cerebral atrophy with widening of the extra-axial spaces and ventricular dilatation. There are areas of decreased attenuation within the white matter tracts of the supratentorial brain, consistent with microvascular disease changes. Small, chronic bilateral basal ganglia lacunar infarcts are noted. Vascular: No hyperdense vessel or unexpected calcification. Skull: Normal. Negative for fracture or focal lesion. Sinuses/Orbits: No acute finding. Other: None. IMPRESSION: 1. No acute intracranial abnormality. 2. Generalized cerebral atrophy with chronic white matter small vessel ischemic changes. Electronically Signed   By: Virgina Norfolk M.D.   On: 06/01/2022 21:14     Scheduled Meds:  amLODipine  5 mg Oral Daily   carvedilol  6.25 mg Oral BID WC  Chlorhexidine Gluconate Cloth  6 each Topical Daily   clopidogrel  75 mg Oral Daily   docusate sodium  100 mg Oral BID   enoxaparin  40 mg Subcutaneous Q24H   feeding supplement  237 mL Oral BID BM   insulin aspart  0-15 Units Subcutaneous TID WC   insulin aspart  0-5 Units Subcutaneous QHS   insulin aspart protamine- aspart  45 Units Subcutaneous BID WC   isosorbide mononitrate  30 mg Oral Daily   [START ON 06/04/2022] levothyroxine  88 mcg Oral Q0600   rosuvastatin  5 mg Oral Daily   Continuous Infusions:     LOS: 2 days   Time spent: 15  Nita Sells, MD [facility] To contact the attending provider between 7A-7P or the covering provider during after hours 7P-7A, please log into the web site [email or web address] and access  using universal [place] password for that web site. If you do not have the password, please call the hospital operator.  06/03/2022, 10:58 AM

## 2022-06-03 NOTE — Evaluation (Signed)
Occupational Therapy Evaluation Patient Details Name: Danielle Murray MRN: [id number] DOB: [birth year] Today's Date: 06/03/2022   History of Present Illness Admitteed after fall resulting in L intertrochaneteric fx, now s/p Marland Kitchen;  has a past medical history of Arthritis, Coronary artery disease, DEPRESSION, Diverticulosis, Hyperlipidemia, Hypertension, HYPOTHYROIDISM, Preseptal cellulitis of right upper eyelid (03/10/2016), SVT (supraventricular tachycardia) ([place]), and Type II diabetes mellitus ([place]).   Clinical Impression   Danielle Murray was evaluated s/p the above admission list. At baseline she is independent. Upon evaluation she was limited by LLE pain, generalized weakness and impaired balance. Overall she required mod-max for all aspects of mobility including 2x sit<>stand with pivotal steps along the bed side with RW. Due to pain and decreased ROM, she requires up to max A for LB ADLs and set up for UB ADLs. She greatly benefits from acute OT. Anticipate good progress acutely to a min A level; if so, pt will be safe to d/c home with HHOT. If not, pt may need to consider SNF.     Recommendations for follow up therapy are one component of a multi-disciplinary discharge planning process, led by the attending physician.  Recommendations may be updated based on patient status, additional functional criteria and insurance authorization.   Follow Up Recommendations  Home health OT (In anticipation pt can progress to min A for transfers - if not, may need to consider post-acute rehab)    Assistance Recommended at Discharge Frequent or constant Supervision/Assistance  Patient can return home with the following A lot of help with walking and/or transfers;A lot of help with bathing/dressing/bathroom;Assistance with cooking/housework;Direct supervision/assist for medications management;Direct supervision/assist for financial management;Assist for transportation;Help with stairs or ramp for entrance     Functional Status Assessment  Patient has had a recent decline in their functional status and demonstrates the ability to make significant improvements in function in a reasonable and predictable amount of time.  Equipment Recommendations  BSC/3in1;Tub/shower seat    Recommendations for Other Services       Precautions / Restrictions Precautions Precautions: Fall Restrictions Weight Bearing Restrictions: Yes LLE Weight Bearing: Weight bearing as tolerated      Mobility Bed Mobility Overal bed mobility: Needs Assistance Bed Mobility: Supine to Sit, Sit to Supine     Supine to sit: Mod assist Sit to supine: Max assist        Transfers Overall transfer level: Needs assistance Equipment used: Rolling walker (2 wheels) Transfers: Sit to/from Stand Sit to Stand: Max assist           General transfer comment: x2 heavy max A the first attempt and heavy mod the second      Balance                                           ADL either performed or assessed with clinical judgement   ADL Overall ADL's : Needs assistance/impaired Eating/Feeding: Independent;Sitting   Grooming: Modified independent;Sitting   Upper Body Bathing: Set up;Sitting   Lower Body Bathing: Maximal assistance;Sit to/from stand   Upper Body Dressing : Set up;Sitting   Lower Body Dressing: Maximal assistance;Sit to/from stand   Toilet Transfer: Maximal assistance;Stand-pivot;BSC/3in1;Rolling walker (2 wheels)   Toileting- Clothing Manipulation and Hygiene: Maximal assistance;Sit to/from stand       Functional mobility during ADLs: Maximal assistance General ADL Comments: sit<>stand with pivotal steps only this session. max A  to power into standing. Reliant on BUE supported on RW in standing and needs incr assist for all LB tasks     Vision Baseline Vision/History: 0 No visual deficits Vision Assessment?: No apparent visual deficits     Perception     Praxis       Pertinent Vitals/Pain Pain Assessment Pain Assessment: Faces Faces Pain Scale: Hurts even more Pain Location: L hip Pain Descriptors / Indicators: Guarding, Grimacing Pain Intervention(s): Monitored during session     Hand Dominance Right   Extremity/Trunk Assessment Upper Extremity Assessment Upper Extremity Assessment: Generalized weakness;Overall [facility] for tasks assessed   Lower Extremity Assessment Lower Extremity Assessment: Defer to PT evaluation LLE Deficits / Details: Grosslsy decr AROM and strength L hip and knee, limited by pain postop; Unable to tolerate much weight on LLE in stance   Cervical / Trunk Assessment Cervical / Trunk Assessment: Kyphotic   Communication Communication Communication: No difficulties   Cognition Arousal/Alertness: Awake/alert Behavior During Therapy: WFL for tasks assessed/performed Overall Cognitive Status: Within Functional Limits for tasks assessed                                       General Comments  VSS on RA, family present and supportive    Exercises     Shoulder Instructions      Home Living Family/patient expects to be discharged to:: Private residence Living Arrangements: Spouse/significant other Available Help at Discharge: Family;Available 24 hours/day Type of Home: House Home Access: Stairs to enter [employer] of Steps: 1   Home Layout: One level     Bathroom Shower/Tub: Walk-in shower         Home Equipment: Conservation officer, nature (2 wheels);Rollator (4 wheels)   Additional Comments: Will need to verify home equipment      Prior Functioning/Environment Prior Level of Function : Independent/Modified Independent             Mobility Comments: Typically walks without an assistive device          OT Problem List: Decreased strength;Decreased range of motion;Decreased activity tolerance;Impaired balance (sitting and/or standing);Decreased safety awareness;Pain      OT  Treatment/Interventions: Self-care/ADL training;Therapeutic exercise;DME and/or AE instruction;Therapeutic activities;Balance training    OT Goals(Current goals can be found in the care plan section) Acute Rehab OT Goals Patient Stated Goal: less pain OT Goal Formulation: With patient Time For Goal Achievement: 06/18/22 Potential to Achieve Goals: Good ADL Goals Pt Will Perform Grooming: with supervision;standing Pt Will Perform Lower Body Dressing: with min assist;sit to/from stand Pt Will Transfer to Toilet: with min assist;ambulating;bedside commode Additional ADL Goal #1: Pt will complete bed mobility with min A as a precursor to ADLs  OT Frequency: Min 2X/week    Co-evaluation              AM-PAC OT "6 Clicks" Daily Activity     Outcome Measure Help from another person eating meals?: None Help from another person taking care of personal grooming?: A Little Help from another person toileting, which includes using toliet, bedpan, or urinal?: A Lot Help from another person bathing (including washing, rinsing, drying)?: A Lot Help from another person to put on and taking off regular upper body clothing?: A Little Help from another person to put on and taking off regular lower body clothing?: A Lot 6 Click Score: 16   End of Session Equipment Utilized During Treatment: Gait  belt;Rolling walker (2 wheels) Nurse Communication: Mobility status  Activity Tolerance: Patient tolerated treatment well Patient left: in bed;with call bell/phone within reach;with bed alarm set;with family/visitor present  OT Visit Diagnosis: Unsteadiness on feet (R26.81);Other abnormalities of gait and mobility (R26.89);Muscle weakness (generalized) (M62.81);History of falling (Z91.81);Pain                Time: 3496-1164 OT Time Calculation (min): 29 min Charges:  OT General Charges $OT Visit: 1 Visit OT Evaluation $OT Eval Moderate Complexity: 1 Mod OT Treatments $Therapeutic Activity: 8-22  mins    Jaydis Duchene A Briena Swingler 06/03/2022, 4:44 PM

## 2022-06-03 NOTE — Evaluation (Signed)
Physical Therapy Evaluation Patient Details Name: Danielle Murray MRN: [id number] DOB: Oct 05, 1941 Today's Date: 06/03/2022  History of Present Illness  Admitteed after fall resulting in L intertrochaneteric fx, now s/p Marland Kitchen;  has a past medical history of Arthritis, Coronary artery disease, DEPRESSION, Diverticulosis, Hyperlipidemia, Hypertension, HYPOTHYROIDISM, Preseptal cellulitis of right upper eyelid (03/10/2016), SVT (supraventricular tachycardia) ([place]), and Type II diabetes mellitus ([place]). (Simultaneous filing. User may not have seen previous data.)  Clinical Impression   Pt admitted with above diagnosis. Lives at home with husband, in a single-level home with one steps to enter; Prior to admission, pt was able to manage independently, typically not needing an assistive device; Presents to PT with pain, decr tolerance of weight bearing LLE, decr functional mobility, decr activity tolerance; Needed Heavy mod assist for bed mobiltiy and sit to stand transfer, and 2 person assist for step pivot transfer bed to chair; At this time, recommend post-acute rehab to maximize independence and safety with mobility and ADLs in prep for dc home;  Pt currently with functional limitations due to the deficits listed below (see PT Problem List). Pt will benefit from skilled PT to increase their independence and safety with mobility to allow discharge to the venue listed below.    Ms. Hollan very much hopes to go home, and we discussed the functional progress she needs to make in order to be able to go home; will monitor progress closely, and update DC plans as able -- to be clear, at this time, PT is recommending SNF for post-acute rehab       Recommendations for follow up therapy are one component of a multi-disciplinary discharge planning process, led by the attending physician.  Recommendations may be updated based on patient status, additional functional criteria and insurance authorization.  Follow  Up Recommendations Skilled nursing-short term rehab (<3 hours/day) (Pt hopes to dc home -- will monitor for progress and update DC plan as able) Can patient physically be transported by private vehicle: No    Assistance Recommended at Discharge Intermittent Supervision/Assistance  Patient can return home with the following       Equipment Recommendations Rolling walker (2 wheels);BSC/3in1;Wheelchair (measurements PT);Wheelchair cushion (measurements PT)  Recommendations for Other Services  OT consult (as ordered)    Functional Status Assessment Patient has had a recent decline in their functional status and demonstrates the ability to make significant improvements in function in a reasonable and predictable amount of time.     Precautions / Restrictions Precautions Precautions: Fall (Simultaneous filing. User may not have seen previous data.) Restrictions Weight Bearing Restrictions: Yes LLE Weight Bearing: Weight bearing as tolerated (Simultaneous filing. User may not have seen previous data.)      Mobility  Bed Mobility Overal bed mobility: Needs Assistance Bed Mobility: Supine to Sit     Supine to sit: Mod assist     General bed mobility comments: Heavy mod assist to scoot towards EOB and push up from R semi-sidelying to sit    Transfers Overall transfer level: Needs assistance Equipment used: Rolling walker (2 wheels) Transfers: Sit to/from Stand, Bed to chair/wheelchair/BSC Sit to Stand: Mod assist, +2 safety/equipment   Step pivot transfers: Mod assist, +2 physical assistance       General transfer comment: Heavy mod assist to power up to stand; Cues for hand placement and technqiue; Difficulty accepting weight onto LLE, so unable to take a full step on the RLE; Used RLE in a heel-toe pivot fashion to step-pivot transfer to recliner  on her R side    Ambulation/Gait                  Stairs            Wheelchair Mobility    Modified Rankin  (Stroke Patients Only)       Balance                                             Pertinent Vitals/Pain Pain Assessment Pain Assessment: Faces Faces Pain Scale: Hurts whole lot Pain Location: L hip Pain Descriptors / Indicators: Guarding, Grimacing Pain Intervention(s): Monitored during session, Limited activity within patient's tolerance    Home Living Family/patient expects to be discharged to:: Private residence (Simultaneous filing. User may not have seen previous data.) Living Arrangements: Spouse/significant other Available Help at Discharge: Family;Available 24 hours/day Type of Home: House Home Access: Stairs to enter   [employer] of Steps: 1   Home Layout: One level Home Equipment: Conservation officer, nature (2 wheels);Rollator (4 wheels) Additional Comments: Will need to verify home equipment    Prior Function Prior Level of Function : Independent/Modified Independent             Mobility Comments: Typically walks without an assistive device       Hand Dominance        Extremity/Trunk Assessment   Upper Extremity Assessment Upper Extremity Assessment: Defer to OT evaluation    Lower Extremity Assessment Lower Extremity Assessment: LLE deficits/detail LLE Deficits / Details: Grosslsy decr AROM and strength L hip and knee, limited by pain postop; Unable to tolerate much weight on LLE in stance       Communication   Communication: No difficulties  Cognition Arousal/Alertness: Awake/alert Behavior During Therapy: WFL for tasks assessed/performed Overall Cognitive Status: Within Functional Limits for tasks assessed                                          General Comments General comments (skin integrity, edema, etc.): VSS stable on room air during session    Exercises     Assessment/Plan    PT Assessment Patient needs continued PT services  PT Problem List Decreased strength;Decreased range of  motion;Decreased activity tolerance;Decreased balance;Decreased mobility;Decreased knowledge of use of DME;Decreased safety awareness;Pain;Decreased knowledge of precautions       PT Treatment Interventions DME instruction;Gait training;Stair training;Functional mobility training;Therapeutic activities;Therapeutic exercise;Balance training;Patient/family education;Wheelchair mobility training    PT Goals (Current goals can be found in the Care Plan section)  Acute Rehab PT Goals Patient Stated Goal: Hopes to progress quickly enough to go home PT Goal Formulation: With patient Time For Goal Achievement: 06/17/22 Potential to Achieve Goals: Good    Frequency Min 5X/week     Co-evaluation               AM-PAC PT "6 Clicks" Mobility  Outcome Measure Help needed turning from your back to your side while in a flat bed without using bedrails?: A Lot Help needed moving from lying on your back to sitting on the side of a flat bed without using bedrails?: A Lot Help needed moving to and from a bed to a chair (including a wheelchair)?: Total Help needed standing up from a chair using your arms (e.g., wheelchair  or bedside chair)?: Total Help needed to walk in hospital room?: Total Help needed climbing 3-5 steps with a railing? : Total 6 Click Score: 8    End of Session Equipment Utilized During Treatment: Gait belt Activity Tolerance: Patient tolerated treatment well Patient left: in chair;with call bell/phone within reach;with chair alarm set Nurse Communication: Mobility status PT Visit Diagnosis: Unsteadiness on feet (R26.81);Pain;Difficulty in walking, not elsewhere classified (R26.2) Pain - Right/Left: Left Pain - part of body: Hip;Leg    Time: 1947-1252 PT Time Calculation (min) (ACUTE ONLY): 32 min   Charges:   PT Evaluation $PT Eval Moderate Complexity: 1 Mod PT Treatments $Therapeutic Activity: 8-22 mins        Roney Marion, PT  Acute Rehabilitation  Services Office [phone]   Colletta Maryland 06/03/2022, 2:29 PM

## 2022-06-03 NOTE — Progress Notes (Signed)
     Subjective:  Patient reports pain as mild.  Mostly having heel pain, but now better that her heel is off the bed. Discussed plan for PT today. No other issues. Denies distal n/t.  Objective:   VITALS:   Vitals:   06/02/22 1517 06/02/22 2059 06/02/22 2100 06/03/22 0435  BP: (!) 147/52 (!) 148/59 (!) 148/59 (!) 180/51  Pulse:  73  68  Resp:    16  Temp:  98.7 F (37.1 C) 98.7 F (37.1 C) 98.6 F (37 C)  TempSrc:  Oral Oral Oral  SpO2:  93%  95%  Weight:      Height:        Sensation intact distally Intact pulses distally Dorsiflexion/Plantar flexion intact Incision: dressing C/D/I Compartment soft   Lab Results  Component Value Date   WBC 10.8 (H) 06/01/2022   HGB 14.4 06/01/2022   HCT 43.4 06/01/2022   MCV 87.9 06/01/2022   PLT 298 06/01/2022   BMET    Component Value Date/Time   NA 135 06/01/2022 2037   NA 136 08/04/2018 0936   K 3.9 06/01/2022 2037   CL 102 06/01/2022 2037   CO2 22 06/01/2022 2037   GLUCOSE 269 (H) 06/01/2022 2037   BUN 27 (H) 06/01/2022 2037   BUN 23 08/04/2018 0936   CREATININE 1.22 (H) 06/01/2022 2037   CALCIUM 9.1 06/01/2022 2037   GFRNONAA 45 (L) 06/01/2022 2037      Xray: xrays demonstrate improved alignment of fracture, hardware intact, no adverse features.  Assessment/Plan: 1 Day Post-Op   Principal Problem:   Closed intertrochanteric fracture of hip, left, initial encounter [facility]) Active Problems:   Hypothyroidism   Elevated lipids   Essential hypertension   Uncontrolled type 2 diabetes mellitus with hyperglycemia, with long-term current use of insulin (HCC)   CAD (coronary artery disease), native coronary artery   Status post surgery  S/p CMN for L intertroch femur fracture 7/22  Post op recs: WB: WBAT LLE Abx: ancef x23 hours post op Imaging: PACU xrays Dressing: keep intact until follow up, change PRN if soiled or saturated. DVT prophylaxis: lovenox starting POD1 x4 weeks Follow up: 2 weeks after  surgery for a wound check with Dr. Zachery Dakins at [facility].  Address: [address] [place], [place], [place] [address]  Office Phone: [phone]    Willaim Sheng 06/03/2022, 7:07 AM   Charlies Constable, MD  Contact information:   [phone] 7am-5pm epic message Dr. Zachery Dakins, or call office for patient follow up: [phone] After hours and holidays please check [email or web address] for group call information for [facility]

## 2022-06-04 ENCOUNTER — Ambulatory Visit: Payer: PPO | Admitting: Cardiovascular Disease

## 2022-06-04 DIAGNOSIS — S72142A Displaced intertrochanteric fracture of left femur, initial encounter for closed fracture: Secondary | ICD-10-CM | POA: Diagnosis not present

## 2022-06-04 LAB — GLUCOSE, CAPILLARY
Glucose-Capillary: 144 mg/dL — ABNORMAL HIGH (ref 70–99)
Glucose-Capillary: 243 mg/dL — ABNORMAL HIGH (ref 70–99)
Glucose-Capillary: 143 mg/dL — ABNORMAL HIGH (ref 70–99)
Glucose-Capillary: 160 mg/dL — ABNORMAL HIGH (ref 70–99)

## 2022-06-04 LAB — CBC WITH DIFFERENTIAL/PLATELET
Abs Immature Granulocytes: 0.09 10*3/uL — ABNORMAL HIGH (ref 0.00–0.07)
Basophils Absolute: 0.1 10*3/uL (ref 0.0–0.1)
Basophils Relative: 1 %
Eosinophils Absolute: 0.2 10*3/uL (ref 0.0–0.5)
Eosinophils Relative: 2 %
HCT: 27.7 % — ABNORMAL LOW (ref 36.0–46.0)
Hemoglobin: 9.6 g/dL — ABNORMAL LOW (ref 12.0–15.0)
Immature Granulocytes: 1 %
Lymphocytes Relative: 16 %
Lymphs Abs: 1.5 10*3/uL (ref 0.7–4.0)
MCH: 29.7 pg (ref 26.0–34.0)
MCHC: 34.7 g/dL (ref 30.0–36.0)
MCV: 85.8 fL (ref 80.0–100.0)
Monocytes Absolute: 1.2 10*3/uL — ABNORMAL HIGH (ref 0.1–1.0)
Monocytes Relative: 12 %
Neutro Abs: 6.5 10*3/uL (ref 1.7–7.7)
Neutrophils Relative %: 68 %
Platelets: 236 10*3/uL (ref 150–400)
RBC: 3.23 MIL/uL — ABNORMAL LOW (ref 3.87–5.11)
RDW: 13.1 % (ref 11.5–15.5)
WBC: 9.5 10*3/uL (ref 4.0–10.5)
nRBC: 0 % (ref 0.0–0.2)

## 2022-06-04 NOTE — Care Management Important Message (Signed)
Important Message  Patient Details  Name: Danielle Murray MRN: [id number] Date of Birth: 08-02-1941   Medicare Important Message Given:  Yes     Nicol Herbig Montine Circle 06/04/2022, 3:46 PM

## 2022-06-04 NOTE — Plan of Care (Signed)
  Problem: Coping: Goal: Ability to adjust to condition or change in health will improve Outcome: Progressing   Problem: Metabolic: Goal: Ability to maintain appropriate glucose levels will improve Outcome: Progressing   Problem: Skin Integrity: Goal: Risk for impaired skin integrity will decrease Outcome: Progressing   

## 2022-06-04 NOTE — Inpatient Diabetes Management (Signed)
Inpatient Diabetes Program Recommendations  AACE/ADA: New Consensus Statement on Inpatient Glycemic Control (2015)  Target Ranges:  Prepandial:   less than 140 mg/dL      Peak postprandial:   less than 180 mg/dL (1-2 hours)      Critically ill patients:  140 - 180 mg/dL   Lab Results  Component Value Date   GLUCAP 243 (H) 06/04/2022   HGBA1C 9.3 (H) 06/01/2022    Review of Glycemic Control  Latest Reference Range & Units 06/03/22 07:42 06/03/22 11:36 06/03/22 16:56 06/03/22 20:32 06/04/22 08:06 06/04/22 11:26  Glucose-Capillary 70 - 99 mg/dL 182 (H) 211 (H) 216 (H) 210 (H) 144 (H) 243 (H)   Diabetes history: DM 2 Outpatient Diabetes medications: Jardiance 15 mg Daily, 70/30 30 units qam, 50 units qpm Current orders for Inpatient glycemic control: 70/30 45 units bid, Novolog 0-15 units tid + hs  A1c 9.3% on 7/21  Spoke with pt and husband at bedside regarding A1c of 9.3% and glucose control at home. I discussed current A1c level of 9.3%. Discussed glucose and A1c goals. Spoke about the importance of glucose control around wound healing. Discussed when to call her PCP for insulin adjustments. Spoke in detail regarding diet and lifestyle modifications. Discussed dietary options in detail.   Thanks,  Tama Headings RN, MSN, BC-ADM Inpatient Diabetes Coordinator Team Pager [phone] (8a-5p)

## 2022-06-04 NOTE — Progress Notes (Addendum)
PROGRESS NOTE   Danielle Murray  FIE:[id number] DOB: 1940/12/10 DOA: 06/01/2022 PCP: [name], MD  Brief Narrative:  81 year old community dwelling Known CAD with PTCA DES to LAD 09/03/2013--last Myoview 04/12/2017 EF 68% small area of apical ischemia EF 55-60% 10/2020 HTN DM TY 2 with nonproliferative diabetic retinopathy Isolated SVT  Was at home rushing to the restroom slipped on a tile and fell striking her head-noted shortening and rotation of left lower extremity given fentanyl 150 placed in c-collar  X-ray showed intertrochanteric fracture left hip orthopedics consulted--x-rays of neck ankle and left knee were negative Labs seem to be in keeping with prior values RCR 6.6 indicating she is clear for surgery  Hospital-Problem based course  Left hip fracture status post left IM nail 7/22 Dr. Zachery Dakins Pain mod--controlled--oral Oxy IR 5-10 every 4 as needed for moderate pain and may give Dilaudid DVT prophylaxis, postoperative weightbearing precautions etc. as per Ortho Patient seems to be doing fair and will get reevaluated by therapy today to decide whether home health with therapy or skilled facility placement Prior CAD 2014 last EF 55-60% HTN compensated heart failure Prior to admission not on aspirin-continue Plavix  Continue Coreg 6.25 twice daily, Imdur 30 daily as well as Crestor 5 does not seem to have any acute Decompensation of heart failure DM TY 2 CBG ranging 144-260 Continue 70/30 insulin at 45 units  DVT prophylaxis: Lovenox Code Status: Full Family Communication: Discussed with patient's husband as well as with the patient's son at the bedside Disposition:  Status is: Inpatient Remains inpatient appropriate because:   Await further PT eval and decide on dispo in a.m.   Consultants:  Orthopedics  Procedures: Perioperative  Antimicrobials: None   Subjective:  Awake coherent states pain is 2/10 on lying still but worse with movement Is about to  get premedicated to work with therapy Motivated to go home husband at bedside   Objective: Vitals:   06/03/22 0737 06/03/22 1932 06/03/22 2000 06/04/22 0806  BP: (!) 128/51 (!) 132/38 (!) 132/38 (!) 134/50  Pulse: 63 72 72 69  Resp: '18 18  18  '$ Temp: 98.2 F (36.8 C) 98.4 F (36.9 C) 98.4 F (36.9 C) 98.9 F (37.2 C)  TempSrc: Oral Oral Oral Oral  SpO2: 94% (!) 89% 92% 95%  Weight:      Height:       No intake or output data in the 24 hours ending 06/04/22 0922  Filed Weights   06/01/22 2044 06/02/22 0929  Weight: 81.6 kg 83.9 kg    Examination:  Pleasant more coherent no icterus no pallor S1-S2 no murmur Chest clear no added sound rales rhonchi No wheeze ROM intact  Data Reviewed: personally reviewed   CBC    Component Value Date/Time   WBC 9.5 06/04/2022 0756   RBC 3.23 (L) 06/04/2022 0756   HGB 9.6 (L) 06/04/2022 0756   HCT 27.7 (L) 06/04/2022 0756   PLT 236 06/04/2022 0756   MCV 85.8 06/04/2022 0756   MCH 29.7 06/04/2022 0756   MCHC 34.7 06/04/2022 0756   RDW 13.1 06/04/2022 0756   LYMPHSABS 1.5 06/04/2022 0756   MONOABS 1.2 (H) 06/04/2022 0756   EOSABS 0.2 06/04/2022 0756   BASOSABS 0.1 06/04/2022 0756      Latest Ref Rng & Units 06/03/2022    6:45 AM 06/01/2022    8:37 PM 10/31/2018    8:27 AM  CMP  Glucose 70 - 99 mg/dL 172  269    BUN 8 -  23 mg/dL 38  27    Creatinine 0.44 - 1.00 mg/dL 1.38  1.22    Sodium 135 - 145 mmol/L 135  135    Potassium 3.5 - 5.1 mmol/L 4.3  3.9    Chloride 98 - 111 mmol/L 102  102    CO2 22 - 32 mmol/L 22  22    Calcium 8.9 - 10.3 mg/dL 8.3  9.1    Total Protein 6.0 - 8.5 g/dL   6.5   Total Bilirubin 0.0 - 1.2 mg/dL   0.6   Alkaline Phos 39 - 117 IU/L   75   AST 0 - 40 IU/L   10   ALT 0 - 32 IU/L   8      Radiology Studies: DG FEMUR PORT MIN 2 VIEWS LEFT  Result Date: 06/02/2022 CLINICAL DATA:  Postoperative. Closed intertrochanteric fracture left hip. EXAM: LEFT FEMUR PORTABLE 2 VIEWS COMPARISON:  Left hip  radiographs 06/01/2022 FINDINGS: Interval left intramedullary nail with 2 proximal femoral neck interlocking screws fixation of the previously seen intertrochanteric fracture. There is improved alignment with resolution of the prior varus angulation. Minimal superior displacement of the distal fracture component with respect to the proximal fracture component. The lesser trochanter is again medially displaced. No evidence of hardware failure. Expected postoperative changes including lateral hip soft tissue swelling and subcutaneous air. Status post total left knee arthroplasty. Severe superior left femoroacetabular osteoarthritis. Moderate vascular calcifications. IMPRESSION: Interval ORIF of the prior left femoral intertrochanteric fracture with improved alignment. No evidence of hardware failure. Electronically Signed   By: Yvonne Kendall M.D.   On: 06/02/2022 15:06   DG FEMUR MIN 2 VIEWS LEFT  Result Date: 06/02/2022 CLINICAL DATA:  Intramedullary nail EXAM: LEFT FEMUR 2 VIEWS; DG C-ARM 1-60 MIN-NO REPORT COMPARISON:  05/31/2022 FLUOROSCOPY: Air kerma 45.49 mGy FINDINGS: Intraoperative fluoroscopic images of the left femur demonstrate intramedullary nail fixation of intratrochanteric fractures of the left femur. Near anatomic alignment of fracture fragments. No obvious perihardware fracture or component malpositioning. Pre-existing left knee arthroplasty. IMPRESSION: Intraoperative fluoroscopic images of the left femur demonstrate intramedullary nail fixation of intratrochanteric fractures of the left femur. Near anatomic alignment of fracture fragments. No obvious perihardware fracture or component malpositioning. Electronically Signed   By: Delanna Ahmadi M.D.   On: 06/02/2022 13:21   DG C-Arm 1-60 Min-No Report  Result Date: 06/02/2022 Fluoroscopy was utilized by the requesting physician.  No radiographic interpretation.   DG C-Arm 1-60 Min-No Report  Result Date: 06/02/2022 Fluoroscopy was utilized  by the requesting physician.  No radiographic interpretation.     Scheduled Meds:  amLODipine  5 mg Oral Daily   carvedilol  6.25 mg Oral BID WC   Chlorhexidine Gluconate Cloth  6 each Topical Daily   clopidogrel  75 mg Oral Daily   docusate sodium  100 mg Oral BID   enoxaparin  40 mg Subcutaneous Q24H   feeding supplement  237 mL Oral BID BM   insulin aspart  0-15 Units Subcutaneous TID WC   insulin aspart  0-5 Units Subcutaneous QHS   insulin aspart protamine- aspart  45 Units Subcutaneous BID WC   isosorbide mononitrate  30 mg Oral Daily   levothyroxine  88 mcg Oral Q0600   rosuvastatin  5 mg Oral Daily   Continuous Infusions:     LOS: 3 days   Time spent: 15  Nita Sells, MD [facility] To contact the attending provider between 7A-7P or the covering provider during after  hours 7P-7A, please log into the web site [email or web address] and access using universal [place] password for that web site. If you do not have the password, please call the hospital operator.  06/04/2022, 9:22 AM

## 2022-06-04 NOTE — Progress Notes (Signed)
Physical Therapy Treatment Patient Details Name: CHANEL MCADAMS MRN: [id number] DOB: 11/11/1941 Today's Date: 06/04/2022   History of Present Illness Admitted after fall resulting in L intertrochaneteric fx, now s/p Marland Kitchen;  has a past medical history of Arthritis, Coronary artery disease, DEPRESSION, Diverticulosis, Hyperlipidemia, Hypertension, HYPOTHYROIDISM, Preseptal cellulitis of right upper eyelid (03/10/2016), SVT (supraventricular tachycardia) ([place]), and Type II diabetes mellitus ([place]).    PT Comments    Continuing work on functional mobility and activity tolerance;  Session focused on pulling together elements of walking: Stance, stepping, and using RW to support LLE in stance to allow for RLE stepping; Needs step-by-step cues and assistance with RLE advancement; Able to walk about 5 ft away from the bed with 2 person assist, RW, and chair follow; This represents an improvement over last session, when pt was unable to advance RLE at all; Continue to recommend SNF for post-acute rehab to maximize independence and safety with mobility and ADLs in prep for dc home  Recommendations for follow up therapy are one component of a multi-disciplinary discharge planning process, led by the attending physician.  Recommendations may be updated based on patient status, additional functional criteria and insurance authorization.  Follow Up Recommendations  Skilled nursing-short term rehab (<3 hours/day) (Pt hopes to dc home -- will monitor for progress and update DC plan as able) Can patient physically be transported by private vehicle: No   Assistance Recommended at Discharge Intermittent Supervision/Assistance  Patient can return home with the following     Equipment Recommendations  Rolling walker (2 wheels);BSC/3in1;Wheelchair (measurements PT);Wheelchair cushion (measurements PT)    Recommendations for Other Services       Precautions / Restrictions Precautions Precautions:  Fall Restrictions LLE Weight Bearing: Weight bearing as tolerated     Mobility  Bed Mobility Overal bed mobility: Needs Assistance Bed Mobility: Supine to Sit     Supine to sit: Mod assist     General bed mobility comments: Heavy mod assist to scoot towards EOB and push up from R semi-sidelying to sit; needs LLE supported throughout process    Transfers Overall transfer level: Needs assistance Equipment used: Rolling walker (2 wheels) Transfers: Sit to/from Stand Sit to Stand: Mod assist           General transfer comment: Heavy Mod (nearly Max) assist to power up to stand; cues for hand placement    Ambulation/Gait Ambulation/Gait assistance: Mod assist, +2 physical assistance, +2 safety/equipment Gait Distance (Feet): 5 Feet Assistive device: Rolling walker (2 wheels) Gait Pattern/deviations: Step-to pattern, Decreased weight shift to left, Antalgic       General Gait Details: Needs step-by-step cues for sequence, and to bear down into RW to support weight and unweigh painful LLE in stance while trying to advance R foot; Mod assist to help step R foot; chair follow behind; Near constant forward lean of trunk with difficulty coming to fully upright standing   Stairs             Wheelchair Mobility    Modified Rankin (Stroke Patients Only)       Balance Overall balance assessment: Needs assistance   Sitting balance-Leahy Scale: Fair Sitting balance - Comments: antalgic R lean     Standing balance-Leahy Scale: Poor                              Cognition Arousal/Alertness: Awake/alert Behavior During Therapy: WFL for tasks assessed/performed Overall Cognitive Status: Within  Functional Limits for tasks assessed                                          Exercises Total Joint Exercises Quad Sets: Left, 5 reps, AROM Short Arc Quad: AROM, Left, 5 reps Long Arc Quad: AROM, Both, 10 reps Knee Flexion: AROM, Left, Seated,  10 reps    General Comments General comments (skin integrity, edema, etc.): VSS on RA      Pertinent Vitals/Pain Pain Assessment Pain Assessment: Faces Faces Pain Scale: Hurts even more Pain Location: L hip Pain Descriptors / Indicators: Guarding, Grimacing Pain Intervention(s): Monitored during session, RN gave pain meds during session    Home Living                          Prior Function            PT Goals (current goals can now be found in the care plan section) Acute Rehab PT Goals Patient Stated Goal: Hopes to progress quickly enough to go home PT Goal Formulation: With patient Time For Goal Achievement: 06/17/22 Potential to Achieve Goals: Good Progress towards PT goals: Progressing toward goals (Slowly)    Frequency    Min 3X/week      PT Plan Current plan remains appropriate;Frequency needs to be updated    Co-evaluation              AM-PAC PT "6 Clicks" Mobility   Outcome Measure  Help needed turning from your back to your side while in a flat bed without using bedrails?: A Lot Help needed moving from lying on your back to sitting on the side of a flat bed without using bedrails?: A Lot Help needed moving to and from a bed to a chair (including a wheelchair)?: Total Help needed standing up from a chair using your arms (e.g., wheelchair or bedside chair)?: Total Help needed to walk in hospital room?: Total Help needed climbing 3-5 steps with a railing? : Total 6 Click Score: 8    End of Session Equipment Utilized During Treatment: Gait belt Activity Tolerance: Patient tolerated treatment well;Patient limited by pain Patient left: in chair;with call bell/phone within reach;with family/visitor present Nurse Communication: Mobility status;Patient requests pain meds PT Visit Diagnosis: Unsteadiness on feet (R26.81);Pain;Difficulty in walking, not elsewhere classified (R26.2) Pain - Right/Left: Left Pain - part of body: Hip;Leg      Time: 1937-9024 PT Time Calculation (min) (ACUTE ONLY): 31 min  Charges:  $Gait Training: 8-22 mins $Therapeutic Exercise: 8-22 mins                     Roney Marion, PT  Acute Rehabilitation Services Office [place] 06/04/2022, 1:21 PM

## 2022-06-04 NOTE — NC FL2 (Signed)
[place] LEVEL OF CARE SCREENING TOOL     IDENTIFICATION  Patient Name: Danielle Murray Birthdate: Mar 02, 1941 Sex: female Admission Date (Current Location): 06/01/2022  [facility] and [place] Number:  Herbalist and Address:  [facility] [place], [place] [address] [place], [place] [address]      Provider Number: [id number]  Attending Physician Name and Address:  Nita Sells, MD  Relative Name and Phone Number:  Kelci, Petrella [phone]  [phone]    Current Level of Care: Hospital Recommended Level of Care: [place] Prior Approval Number:    Date Approved/Denied:   PASRR Number: [id number]  Discharge Plan: SNF    Current Diagnoses: Patient Active Problem List   Diagnosis Date Noted   Status post surgery 06/02/2022   Closed intertrochanteric fracture of hip, left, initial encounter ([place]) 06/01/2022   Early stage nonexudative age-related macular degeneration of both eyes 04/23/2022   Hypertensive retinopathy of right eye 03/28/2020   Posterior vitreous detachment of left eye 03/28/2020   Mild nonproliferative diabetic retinopathy of both eyes ([place]) 03/28/2020   Posterior vitreous detachment of right eye 03/28/2020   Peripheral arterial disease ([place]) 02/04/2019   CAD (coronary artery disease), native coronary artery 09/04/2013   Exertional angina ([place]) 09/04/2013   SVT (supraventricular tachycardia) ([place]) 09/04/2013   Uncontrolled type 2 diabetes mellitus with hyperglycemia, with long-term current use of insulin ([place]) 08/07/2012   Diverticulosis 08/07/2012   Elevated lipids 01/09/2008   Essential hypertension 01/09/2008   Hypothyroidism 10/24/2007   DEPRESSION 10/24/2007    Orientation RESPIRATION BLADDER Height & Weight     Self, Time, Situation, Place  Normal Continent Weight: 185 lb (83.9 kg) Height:  '5\' 4"'$  (162.6 cm)  BEHAVIORAL SYMPTOMS/MOOD NEUROLOGICAL BOWEL NUTRITION STATUS       Continent Diet (see discharge summary)  AMBULATORY STATUS COMMUNICATION OF NEEDS Skin   Total Care Verbally Surgical wounds                       Personal Care Assistance Level of Assistance  Bathing, Feeding, Dressing Bathing Assistance: Maximum assistance Feeding assistance: Independent Dressing Assistance: Maximum assistance     Functional Limitations Info  Sight, Hearing, Speech Sight Info: Adequate Hearing Info: Adequate Speech Info: Adequate    SPECIAL CARE FACTORS FREQUENCY  PT (By licensed PT), OT (By licensed OT)     PT Frequency: 5x week OT Frequency: 5x week            Contractures Contractures Info: Not present    Additional Factors Info  Code Status, Allergies, Insulin Sliding Scale Code Status Info: full Allergies Info: penicillins   Insulin Sliding Scale Info: Novolog: see discharge summary       Current Medications (06/04/2022):  This is the current hospital active medication list Current Facility-Administered Medications  Medication Dose Route Frequency Provider Last Rate Last Admin   amLODipine (NORVASC) tablet 5 mg  5 mg Oral Daily Willaim Sheng, MD   5 mg at 06/04/22 0858   carvedilol (COREG) tablet 6.25 mg  6.25 mg Oral BID WC Willaim Sheng, MD   6.25 mg at 06/04/22 0858   Chlorhexidine Gluconate Cloth 2 % PADS 6 each  6 each Topical Daily Nita Sells, MD   6 each at 06/04/22 0858   clopidogrel (PLAVIX) tablet 75 mg  75 mg Oral Daily Nita Sells, MD   75 mg at 06/04/22 0858   docusate sodium (COLACE) capsule 100  mg  100 mg Oral BID Willaim Sheng, MD   100 mg at 06/04/22 0858   enoxaparin (LOVENOX) injection 40 mg  40 mg Subcutaneous Q24H Willaim Sheng, MD   40 mg at 06/04/22 0857   feeding supplement (ENSURE ENLIVE / ENSURE PLUS) liquid 237 mL  237 mL Oral BID BM Nita Sells, MD   237 mL at 06/04/22 0859   HYDROmorphone (DILAUDID) injection 0.5 mg  0.5 mg Intravenous Q2H PRN  Willaim Sheng, MD   0.5 mg at 06/03/22 2018   insulin aspart (novoLOG) injection 0-15 Units  0-15 Units Subcutaneous TID WC Willaim Sheng, MD   2 Units at 06/04/22 0857   insulin aspart (novoLOG) injection 0-5 Units  0-5 Units Subcutaneous QHS Willaim Sheng, MD   2 Units at 06/03/22 2137   insulin aspart protamine- aspart (NOVOLOG MIX 70/30) injection 45 Units  45 Units Subcutaneous BID WC Nita Sells, MD   45 Units at 06/04/22 0859   isosorbide mononitrate (IMDUR) 24 hr tablet 30 mg  30 mg Oral Daily Willaim Sheng, MD   30 mg at 06/04/22 0858   levothyroxine (SYNTHROID) tablet 88 mcg  88 mcg Oral Q0600 Samella Parr, NP   88 mcg at 06/04/22 7322   melatonin tablet 10 mg  10 mg Oral QHS PRN Willaim Sheng, MD       ondansetron [facility]) injection 4 mg  4 mg Intravenous Q6H PRN Willaim Sheng, MD   4 mg at 06/02/22 0155   oxyCODONE (Oxy IR/ROXICODONE) immediate release tablet 5-10 mg  5-10 mg Oral Q4H PRN Willaim Sheng, MD   5 mg at 06/04/22 0610   rosuvastatin (CRESTOR) tablet 5 mg  5 mg Oral Daily Willaim Sheng, MD   5 mg at 06/04/22 0254     Discharge Medications: Please see discharge summary for a list of discharge medications.  Relevant Imaging Results:  Relevant Lab Results:   Additional Information SSN: [id number].  Pt is vaccinated for covid but not boosted.  Joanne Chars, LCSW

## 2022-06-04 NOTE — Progress Notes (Signed)
     Subjective: Patient reports pain as well controlled.  Heel pain improved since keeping it off the bed.  Worked well with both physical therapy and Occupational Therapy yesterday.  She was able to get up to the recliner where she stayed for over an hour.  She denies distal numbness and tingling.  She is in good spirits.  Hoping to improve functionally able to go home but understands she might need to go to rehab for a period of time.  Objective:   VITALS:   Vitals:   06/03/22 0737 06/03/22 1932 06/03/22 2000 06/04/22 0806  BP: (!) 128/51 (!) 132/38 (!) 132/38 (!) 134/50  Pulse: 63 72 72 69  Resp: '18 18  18  '$ Temp: 98.2 F (36.8 C) 98.4 F (36.9 C) 98.4 F (36.9 C) 98.9 F (37.2 C)  TempSrc: Oral Oral Oral Oral  SpO2: 94% (!) 89% 92% 95%  Weight:      Height:        Sensation intact distally Intact pulses distally Dorsiflexion/Plantar flexion intact Incision: dressing C/D/I Compartment soft   Lab Results  Component Value Date   WBC 9.5 06/04/2022   HGB 9.6 (L) 06/04/2022   HCT 27.7 (L) 06/04/2022   MCV 85.8 06/04/2022   PLT 236 06/04/2022   BMET    Component Value Date/Time   NA 135 06/03/2022 0645   NA 136 08/04/2018 0936   K 4.3 06/03/2022 0645   CL 102 06/03/2022 0645   CO2 22 06/03/2022 0645   GLUCOSE 172 (H) 06/03/2022 0645   BUN 38 (H) 06/03/2022 0645   BUN 23 08/04/2018 0936   CREATININE 1.38 (H) 06/03/2022 0645   CALCIUM 8.3 (L) 06/03/2022 0645   GFRNONAA 39 (L) 06/03/2022 0645      Xray: xrays demonstrate improved alignment of fracture, hardware intact, no adverse features.  Assessment/Plan: 2 Days Post-Op   Principal Problem:   Closed intertrochanteric fracture of hip, left, initial encounter [facility]) Active Problems:   Hypothyroidism   Elevated lipids   Essential hypertension   Uncontrolled type 2 diabetes mellitus with hyperglycemia, with long-term current use of insulin (H[facility])   CAD (coronary artery disease), native coronary artery    Status post surgery  S/p CMN for L intertroch femur fracture 7/22  Post op recs: WB: WBAT LLE Abx: ancef x23 hours post op Imaging: PACU xrays Dressing: keep intact until follow up, change PRN if soiled or saturated. DVT prophylaxis: lovenox starting POD1 x4 weeks Follow up: 2 weeks after surgery for a wound check with Dr. Zachery Dakins at [facility].  Address: [address] [place], [place], [place] [address]  Office Phone: [phone]    Willaim Sheng 06/04/2022, 9:37 AM   Charlies Constable, MD  Contact information:   [phone] 7am-5pm epic message Dr. Zachery Dakins, or call office for patient follow up: [phone] After hours and holidays please check [email or web address] for group call information for [facility]

## 2022-06-04 NOTE — TOC CAGE-AID Note (Signed)
Transition of Care [facility]) - CAGE-AID Screening   Patient Details  Name: Danielle Murray MRN: [id number] Date of Birth: 02-08-1941  Clinical Narrative: Patient denies drug or alcohol use.   CAGE-AID Screening:    Have You Ever Felt You Ought to Cut Down on Your Drinking or Drug Use?: No Have People Annoyed You By Critizing Your Drinking Or Drug Use?: No Have You Felt Bad Or Guilty About Your Drinking Or Drug Use?: No Have You Ever Had a Drink or Used Drugs First Thing In The Morning to Steady Your Nerves or to Get Rid of a Hangover?: No CAGE-AID Score: 0  Substance Abuse Education Offered: No

## 2022-06-04 NOTE — TOC Initial Note (Signed)
Transition of Care [facility]) - Initial/Assessment Note    Patient Details  Name: Danielle Murray MRN: [id number] Date of Birth: [birth year]  Transition of Care [facility]) CM/SW Contact:    Joanne Chars, LCSW Phone Number: 06/04/2022, 11:42 AM  Clinical Narrative:     CSW spoke with pt and husband Marcello Moores about DC recommendation for SNF.  Permission given to speak with husband and with son Wille Glaser.  Pt agreeable to SNF, choice document given, permission given to send out referral in hub.  Pt lives at home with husband and son. Pt is vaccinated for covid but not booted.    Referral sent out in hub for SNF.              Expected Discharge Plan: Skilled Nursing Facility Barriers to Discharge: Continued Medical Work up, SNF Pending bed offer   Patient Goals and CMS Choice Patient states their goals for this hospitalization and ongoing recovery are:: walk with a cane CMS Medicare.gov Compare Post Acute Care list provided to:: Patient Choice offered to / list presented to : Patient  Expected Discharge Plan and Services Expected Discharge Plan: [place] In-house Referral: Clinical Social Work   Post Acute Care Choice: [place] Living arrangements for the past 2 months: [place]                                      Prior Living Arrangements/Services Living arrangements for the past 2 months: Single Family Home Lives with:: Spouse, Adult Children (husband and son Wille Glaser) Patient language and need for interpreter reviewed:: Yes Do you feel safe going back to the place where you live?: Yes      Need for Family Participation in Patient Care: Yes (Comment) Care giver support system in place?: Yes (comment) Current home services: Other (comment) (none) Criminal Activity/Legal Involvement Pertinent to Current Situation/Hospitalization: No - Comment as needed  Activities of Daily Living Home Assistive Devices/Equipment: None ADL Screening (condition  at time of admission) Patient's cognitive ability adequate to safely complete daily activities?: Yes Is the patient deaf or have difficulty hearing?: No Does the patient have difficulty seeing, even when wearing glasses/contacts?: No Does the patient have difficulty concentrating, remembering, or making decisions?: No Patient able to express need for assistance with ADLs?: Yes Does the patient have difficulty dressing or bathing?: No Independently performs ADLs?: Yes (appropriate for developmental age) Does the patient have difficulty walking or climbing stairs?: No Weakness of Legs: None Weakness of Arms/Hands: None  Permission Sought/Granted Permission sought to share information with : Family Supports Permission granted to share information with : Yes, Verbal Permission Granted  Share Information with NAME: husband Marcello Moores, son Wille Glaser  Permission granted to share info w AGENCY: SNF        Emotional Assessment Appearance:: Appears stated age Attitude/Demeanor/Rapport: Engaged Affect (typically observed): Appropriate, Pleasant Orientation: : Oriented to Self, Oriented to Place, Oriented to  Time, Oriented to Situation Alcohol / Substance Use: Not Applicable Psych Involvement: No (comment)  Admission diagnosis:  Closed fracture of left hip, initial encounter ([place]) [S72.002A] Closed intertrochanteric fracture of hip, left, initial encounter ([place]) [S72.142A] Status post surgery [Z98.890] Patient Active Problem List   Diagnosis Date Noted   Status post surgery 06/02/2022   Closed intertrochanteric fracture of hip, left, initial encounter ([place]) 06/01/2022   Early stage nonexudative age-related macular degeneration of both eyes 04/23/2022   Hypertensive  retinopathy of right eye 03/28/2020   Posterior vitreous detachment of left eye 03/28/2020   Mild nonproliferative diabetic retinopathy of both eyes ([place]) 03/28/2020   Posterior vitreous detachment of right eye 03/28/2020   Peripheral  arterial disease ([place]) 02/04/2019   CAD (coronary artery disease), native coronary artery 09/04/2013   Exertional angina ([place]) 09/04/2013   SVT (supraventricular tachycardia) ([place]) 09/04/2013   Uncontrolled type 2 diabetes mellitus with hyperglycemia, with long-term current use of insulin ([place]) 08/07/2012   Diverticulosis 08/07/2012   Elevated lipids 01/09/2008   Essential hypertension 01/09/2008   Hypothyroidism 10/24/2007   DEPRESSION 10/24/2007   PCP:  [name], MD Pharmacy:   [facility], [address] [place] [place] [address] Phone: [phone] Fax: [phone]     Social Determinants of Health (SDOH) Interventions    Readmission Risk Interventions     No data to display

## 2022-06-05 DIAGNOSIS — R262 Difficulty in walking, not elsewhere classified: Secondary | ICD-10-CM | POA: Diagnosis not present

## 2022-06-05 DIAGNOSIS — E038 Other specified hypothyroidism: Secondary | ICD-10-CM | POA: Diagnosis not present

## 2022-06-05 DIAGNOSIS — I214 Non-ST elevation (NSTEMI) myocardial infarction: Secondary | ICD-10-CM | POA: Diagnosis not present

## 2022-06-05 DIAGNOSIS — L89322 Pressure ulcer of left buttock, stage 2: Secondary | ICD-10-CM | POA: Diagnosis not present

## 2022-06-05 DIAGNOSIS — E039 Hypothyroidism, unspecified: Secondary | ICD-10-CM | POA: Diagnosis not present

## 2022-06-05 DIAGNOSIS — I340 Nonrheumatic mitral (valve) insufficiency: Secondary | ICD-10-CM | POA: Diagnosis not present

## 2022-06-05 DIAGNOSIS — R0902 Hypoxemia: Secondary | ICD-10-CM | POA: Diagnosis not present

## 2022-06-05 DIAGNOSIS — N1831 Chronic kidney disease, stage 3a: Secondary | ICD-10-CM | POA: Diagnosis not present

## 2022-06-05 DIAGNOSIS — S72143D Displaced intertrochanteric fracture of unspecified femur, subsequent encounter for closed fracture with routine healing: Secondary | ICD-10-CM | POA: Diagnosis not present

## 2022-06-05 DIAGNOSIS — Z743 Need for continuous supervision: Secondary | ICD-10-CM | POA: Diagnosis not present

## 2022-06-05 DIAGNOSIS — I509 Heart failure, unspecified: Secondary | ICD-10-CM | POA: Diagnosis not present

## 2022-06-05 DIAGNOSIS — Z7902 Long term (current) use of antithrombotics/antiplatelets: Secondary | ICD-10-CM | POA: Diagnosis not present

## 2022-06-05 DIAGNOSIS — Z7984 Long term (current) use of oral hypoglycemic drugs: Secondary | ICD-10-CM | POA: Diagnosis not present

## 2022-06-05 DIAGNOSIS — R29898 Other symptoms and signs involving the musculoskeletal system: Secondary | ICD-10-CM | POA: Diagnosis not present

## 2022-06-05 DIAGNOSIS — I959 Hypotension, unspecified: Secondary | ICD-10-CM | POA: Diagnosis not present

## 2022-06-05 DIAGNOSIS — D62 Acute posthemorrhagic anemia: Secondary | ICD-10-CM | POA: Diagnosis not present

## 2022-06-05 DIAGNOSIS — I5021 Acute systolic (congestive) heart failure: Secondary | ICD-10-CM | POA: Diagnosis not present

## 2022-06-05 DIAGNOSIS — R41841 Cognitive communication deficit: Secondary | ICD-10-CM | POA: Diagnosis not present

## 2022-06-05 DIAGNOSIS — R509 Fever, unspecified: Secondary | ICD-10-CM | POA: Diagnosis not present

## 2022-06-05 DIAGNOSIS — I272 Pulmonary hypertension, unspecified: Secondary | ICD-10-CM | POA: Diagnosis not present

## 2022-06-05 DIAGNOSIS — R0602 Shortness of breath: Secondary | ICD-10-CM | POA: Diagnosis not present

## 2022-06-05 DIAGNOSIS — Z8249 Family history of ischemic heart disease and other diseases of the circulatory system: Secondary | ICD-10-CM | POA: Diagnosis not present

## 2022-06-05 DIAGNOSIS — I10 Essential (primary) hypertension: Secondary | ICD-10-CM | POA: Diagnosis not present

## 2022-06-05 DIAGNOSIS — Z20822 Contact with and (suspected) exposure to covid-19: Secondary | ICD-10-CM | POA: Diagnosis not present

## 2022-06-05 DIAGNOSIS — Y95 Nosocomial condition: Secondary | ICD-10-CM | POA: Diagnosis present

## 2022-06-05 DIAGNOSIS — R062 Wheezing: Secondary | ICD-10-CM | POA: Diagnosis not present

## 2022-06-05 DIAGNOSIS — E1122 Type 2 diabetes mellitus with diabetic chronic kidney disease: Secondary | ICD-10-CM | POA: Diagnosis not present

## 2022-06-05 DIAGNOSIS — S72142A Displaced intertrochanteric fracture of left femur, initial encounter for closed fracture: Secondary | ICD-10-CM | POA: Diagnosis not present

## 2022-06-05 DIAGNOSIS — Z794 Long term (current) use of insulin: Secondary | ICD-10-CM | POA: Diagnosis not present

## 2022-06-05 DIAGNOSIS — E113293 Type 2 diabetes mellitus with mild nonproliferative diabetic retinopathy without macular edema, bilateral: Secondary | ICD-10-CM | POA: Diagnosis not present

## 2022-06-05 DIAGNOSIS — I2510 Atherosclerotic heart disease of native coronary artery without angina pectoris: Secondary | ICD-10-CM | POA: Diagnosis not present

## 2022-06-05 DIAGNOSIS — N390 Urinary tract infection, site not specified: Secondary | ICD-10-CM | POA: Diagnosis not present

## 2022-06-05 DIAGNOSIS — I130 Hypertensive heart and chronic kidney disease with heart failure and stage 1 through stage 4 chronic kidney disease, or unspecified chronic kidney disease: Secondary | ICD-10-CM | POA: Diagnosis not present

## 2022-06-05 DIAGNOSIS — R0689 Other abnormalities of breathing: Secondary | ICD-10-CM | POA: Diagnosis not present

## 2022-06-05 DIAGNOSIS — S72142D Displaced intertrochanteric fracture of left femur, subsequent encounter for closed fracture with routine healing: Secondary | ICD-10-CM | POA: Diagnosis not present

## 2022-06-05 DIAGNOSIS — Z880 Allergy status to penicillin: Secondary | ICD-10-CM | POA: Diagnosis not present

## 2022-06-05 DIAGNOSIS — Z79890 Hormone replacement therapy (postmenopausal): Secondary | ICD-10-CM | POA: Diagnosis not present

## 2022-06-05 DIAGNOSIS — J9601 Acute respiratory failure with hypoxia: Secondary | ICD-10-CM | POA: Diagnosis not present

## 2022-06-05 DIAGNOSIS — Z7389 Other problems related to life management difficulty: Secondary | ICD-10-CM | POA: Diagnosis not present

## 2022-06-05 DIAGNOSIS — I5043 Acute on chronic combined systolic (congestive) and diastolic (congestive) heart failure: Secondary | ICD-10-CM | POA: Diagnosis not present

## 2022-06-05 DIAGNOSIS — Z79899 Other long term (current) drug therapy: Secondary | ICD-10-CM | POA: Diagnosis not present

## 2022-06-05 DIAGNOSIS — M6281 Muscle weakness (generalized): Secondary | ICD-10-CM | POA: Diagnosis not present

## 2022-06-05 DIAGNOSIS — I519 Heart disease, unspecified: Secondary | ICD-10-CM | POA: Diagnosis not present

## 2022-06-05 DIAGNOSIS — J189 Pneumonia, unspecified organism: Secondary | ICD-10-CM | POA: Diagnosis not present

## 2022-06-05 DIAGNOSIS — I739 Peripheral vascular disease, unspecified: Secondary | ICD-10-CM | POA: Diagnosis not present

## 2022-06-05 DIAGNOSIS — E669 Obesity, unspecified: Secondary | ICD-10-CM | POA: Diagnosis not present

## 2022-06-05 DIAGNOSIS — A419 Sepsis, unspecified organism: Secondary | ICD-10-CM | POA: Diagnosis not present

## 2022-06-05 DIAGNOSIS — E1165 Type 2 diabetes mellitus with hyperglycemia: Secondary | ICD-10-CM | POA: Diagnosis not present

## 2022-06-05 DIAGNOSIS — I5041 Acute combined systolic (congestive) and diastolic (congestive) heart failure: Secondary | ICD-10-CM | POA: Diagnosis not present

## 2022-06-05 DIAGNOSIS — I25110 Atherosclerotic heart disease of native coronary artery with unstable angina pectoris: Secondary | ICD-10-CM | POA: Diagnosis not present

## 2022-06-05 LAB — GLUCOSE, CAPILLARY
Glucose-Capillary: 134 mg/dL — ABNORMAL HIGH (ref 70–99)
Glucose-Capillary: 116 mg/dL — ABNORMAL HIGH (ref 70–99)

## 2022-06-05 MED ORDER — OXYCODONE HCL 5 MG PO TABS: 5.0000 mg | ORAL_TABLET | ORAL | 0 refills | Status: DC | PRN

## 2022-06-05 MED ORDER — ENOXAPARIN SODIUM 40 MG/0.4ML IJ SOSY: 40.0000 mg | PREFILLED_SYRINGE | INTRAMUSCULAR | 0 refills | Status: DC

## 2022-06-05 NOTE — Progress Notes (Signed)
OT Cancellation Note  Patient Details Name: Danielle Murray MRN: [id number] DOB: July 04, 1941   Cancelled Treatment:    Reason Eval/Treat Not Completed: Other (comment). Attempted to see patient for OT treatment prior to discharge to SNF today. Pt reports she is currently waiting on a late lunch tray although she is willing to participate in OT session to focus on functional transfers. Before session could get started, patient's lunch tray arrived and patient opted to eat her lunch as her blood sugar had dropped earlier. Due to planned discharge this afternoon, all further OT services can be met at the next venue. Pt will be removed from acute caseload.   Ailene Ravel, OTR/L,CBIS  Supplemental OT - [facility] and [facility]  06/05/2022, 2:03 PM

## 2022-06-05 NOTE — Progress Notes (Signed)
Physical Therapy Treatment Patient Details Name: Danielle Murray MRN: [id number] DOB: 24-Jul-1941 Today's Date: 06/05/2022   History of Present Illness Admitted after fall resulting in L intertrochaneteric fx, now s/p [place] Kitchen;  has a past medical history of Arthritis, Coronary artery disease, DEPRESSION, Diverticulosis, Hyperlipidemia, Hypertension, HYPOTHYROIDISM, Preseptal cellulitis of right upper eyelid (03/10/2016), SVT (supraventricular tachycardia) ([place]), and Type II diabetes mellitus ([place]).    PT Comments    Continuing work on functional mobility and activity tolerance;  session focused on continued gait training, with good progress noted; better tolernace of LLE single limb stance resulted in more efficient steps, and less need for assist; Pt seemed pleased with today's progress; Overall progressing well; Anticipate continuing good progress at post-acute rehabilitation.    Recommendations for follow up therapy are one component of a multi-disciplinary discharge planning process, led by the attending physician.  Recommendations may be updated based on patient status, additional functional criteria and insurance authorization.  Follow Up Recommendations  Skilled nursing-short term rehab (<3 hours/day)     Assistance Recommended at Discharge Intermittent Supervision/Assistance  Patient can return home with the following     Equipment Recommendations  Rolling walker (2 wheels);BSC/3in1;Wheelchair (measurements PT);Wheelchair cushion (measurements PT)    Recommendations for Other Services       Precautions / Restrictions Precautions Precautions: Fall Restrictions LLE Weight Bearing: Weight bearing as tolerated     Mobility  Bed Mobility Overal bed mobility: Needs Assistance Bed Mobility: Supine to Sit     Supine to sit: Min assist     General bed mobility comments: Min assist adn cues for techqniue    Transfers Overall transfer level: Needs assistance Equipment  used: Rolling walker (2 wheels) Transfers: Sit to/from Stand Sit to Stand: Mod assist           General transfer comment: Mod assist to power up; slow rise, but pt able to get to standing with bil hips fully extended for upright standing    Ambulation/Gait Ambulation/Gait assistance: Min assist, +2 safety/equipment Gait Distance (Feet): 6 Feet Assistive device: Rolling walker (2 wheels) Gait Pattern/deviations: Step-to pattern, Decreased weight shift to left, Antalgic       General Gait Details: Cues for sequence; Better tolerance of weight shift onto LLE in stance, which lead to smoother steps   Stairs             Wheelchair Mobility    Modified Rankin (Stroke Patients Only)       Balance     Sitting balance-Leahy Scale: Fair       Standing balance-Leahy Scale: Poor                              Cognition Arousal/Alertness: Awake/alert Behavior During Therapy: WFL for tasks assessed/performed Overall Cognitive Status: Within Functional Limits for tasks assessed                                          Exercises      General Comments General comments (skin integrity, edema, etc.): VSS on RA      Pertinent Vitals/Pain Pain Assessment Pain Assessment: Faces Faces Pain Scale: Hurts little more Pain Location: L hip Pain Descriptors / Indicators: Guarding, Grimacing Pain Intervention(s): Monitored during session    Home Living  Prior Function            PT Goals (current goals can now be found in the care plan section) Acute Rehab PT Goals Patient Stated Goal: Hopes to progress quickly enough to go home PT Goal Formulation: With patient Time For Goal Achievement: 06/17/22 Potential to Achieve Goals: Good Progress towards PT goals: Progressing toward goals    Frequency    Min 3X/week      PT Plan Current plan remains appropriate;Frequency needs to be updated     Co-evaluation              AM-PAC PT "6 Clicks" Mobility   Outcome Measure  Help needed turning from your back to your side while in a flat bed without using bedrails?: A Lot Help needed moving from lying on your back to sitting on the side of a flat bed without using bedrails?: A Lot Help needed moving to and from a bed to a chair (including a wheelchair)?: A Lot Help needed standing up from a chair using your arms (e.g., wheelchair or bedside chair)?: A Lot Help needed to walk in hospital room?: A Lot Help needed climbing 3-5 steps with a railing? : Total 6 Click Score: 11    End of Session Equipment Utilized During Treatment: Gait belt Activity Tolerance: Patient tolerated treatment well;Patient limited by pain Patient left: in chair;with call bell/phone within reach;with family/visitor present Nurse Communication: Mobility status;Patient requests pain meds PT Visit Diagnosis: Unsteadiness on feet (R26.81);Pain;Difficulty in walking, not elsewhere classified (R26.2) Pain - Right/Left: Left Pain - part of body: Hip;Leg     Time: 3734-2876 PT Time Calculation (min) (ACUTE ONLY): 20 min  Charges:  $Gait Training: 8-22 mins                     Roney Marion, [place] Office [phone]    Colletta [place] 06/05/2022, 3:05 PM

## 2022-06-05 NOTE — Discharge Summary (Signed)
Physician Discharge Summary  Danielle Murray [id number] DOB: Mar 25, 1941 DOA: 06/01/2022  PCP: [name], MD  Admit date: 06/01/2022 Discharge date: 06/05/2022  Time spent: 34 minutes  Recommendations for Outpatient Follow-up:  Requires CBC, Chem-12 in 1 week Keep dressings dry and if becomes soiled or wet changed them and keep them intact until office visit with Dr. Zachery Dakins in 2 weeks please call his office for follow-up Continue Lovenox until 8/18 which is 4 weeks out from surgery Weightbearing as tolerated left lower extremity  Discharge Diagnoses:  MAIN problem for hospitalization   Left lower extremity intertrochanteric femoral fracture status post repair DM TY 2 Prior CAD  Please see below for itemized issues addressed in HOpsital- refer to other progress notes for clarity if needed  Discharge Condition: Improved  Diet recommendation: Diabetic heart healthy  Filed Weights   06/01/22 2044 06/02/22 0929  Weight: 81.6 kg 83.9 kg    History of present illness:  81 year old community dwelling Known CAD with PTCA DES to LAD 09/03/2013--last Myoview 04/12/2017 EF 68% small area of apical ischemia EF 55-60% 10/2020 HTN DM TY 2 with nonproliferative diabetic retinopathy Isolated SVT   Was at home rushing to the restroom slipped on a tile and fell striking her head-noted shortening and rotation of left lower extremity given fentanyl 150 placed in c-collar   X-ray showed intertrochanteric fracture left hip orthopedics consulted--x-rays of neck ankle and left knee were negative Labs seem to be in keeping with prior values RCR 6.6 indicating she is clear for surgery  Hospital Course:  Left hip fracture status post left IM nail 7/22 Dr. Zachery Dakins Pain mod--controlled--oral Oxy IR 5-10 every 4 as needed f and prescription was prescribed for the patient DVT prophylaxis with Lovenox until 06/29/2022, weightbearing as tolerated Patient seems to be doing fair and ultimately  it was decided she would do best at rehab Prior CAD 2014 last EF 55-60% HTN compensated heart failure Prior to admission not on aspirin-continue Plavix  Continue Coreg 6.25 twice daily, Imdur 30 daily as well as Crestor 5 does not seem to have any acute Decompensation of heart failure DM TY 2 CBG ranging moderately controlled Was resumed on 70/30 insulin at higher dose of 50 units and was resumed on Jardiance as well on discharge  Discharge Exam: Vitals:   06/05/22 0439 06/05/22 0816  BP: (!) 141/43 (!) 148/44  Pulse: (!) 59 (!) 58  Resp: 15 18  Temp: 99.3 F (37.4 C) 98.5 F (36.9 C)  SpO2: 92% 94%    Subj on day of d/c   Awake coherent no distress pain is manageable had a low-grade temp but otherwise feels well no fever no diarrhea no dysuria  General Exam on discharge  Coherent pleasant interactive No distress ROM intact CTA B no added sound no rales rhonchi no wheeze Abdomen soft nontender Neurologically intact  Discharge Instructions   Discharge Instructions     Diet - low sodium heart healthy   Complete by: As directed    Increase activity slowly   Complete by: As directed    No wound care   Complete by: As directed    See the orders      Allergies as of 06/05/2022       Reactions   Penicillins    Diffuse joint pain @ age 81 in context of Scarlet Fever Did it involve swelling of the face/tongue/throat, SOB, or low BP? No Did it involve sudden or severe rash/hives, skin peeling, or any reaction  on the inside of your mouth or nose? No Did you need to seek medical attention at a hospital or doctor's office? No When did it last happen?  childhood     If all above answers are "NO", may proceed with cephalosporin use.          Medication List     TAKE these medications    amLODipine 5 MG tablet Commonly known as: NORVASC Take 1 tablet (5 mg total) by mouth daily. Please keep upcoming appointment for future refills. Thank you.   carvedilol 6.25  MG tablet Commonly known as: COREG TAKE 1 TABLET BY MOUTH TWICE DAILY WITH A MEAL   clopidogrel 75 MG tablet Commonly known as: PLAVIX Take 1 tablet by mouth once daily   enoxaparin 40 MG/0.4ML injection Commonly known as: LOVENOX Inject 0.4 mLs (40 mg total) into the skin daily for 23 days. Start taking on: June 06, 2022   furosemide 40 MG tablet Commonly known as: LASIX Take 1 tablet (40 mg total) by mouth daily as needed for fluid or edema. What changed: when to take this   isosorbide mononitrate 30 MG 24 hr tablet Commonly known as: IMDUR Take 1 tablet by mouth once daily   Jardiance 25 MG Tabs tablet Generic drug: empagliflozin Take 25 mg by mouth daily.   levothyroxine 88 MCG tablet Commonly known as: SYNTHROID Take 88 mcg by mouth at bedtime.   nitroGLYCERIN 0.4 MG SL tablet Commonly known as: NITROSTAT Place 1 tablet (0.4 mg total) under the tongue every 5 (five) minutes as needed for chest pain (3 doses max).   NovoLIN 70/30 Kwikpen (70-30) 100 UNIT/ML KwikPen Generic drug: insulin isophane & regular human KwikPen Inject 30-50 Units into the skin in the morning and at bedtime. Take 30 units in the morning and Take 50 units at bedtime   oxyCODONE 5 MG immediate release tablet Commonly known as: Oxy IR/ROXICODONE Take 1-2 tablets (5-10 mg total) by mouth every 4 (four) hours as needed for severe pain or moderate pain.   PROBIOTIC PO Take 1 tablet by mouth daily.   rosuvastatin 5 MG tablet Commonly known as: CRESTOR Take 1 tablet (5 mg total) by mouth daily. What changed: Another medication with the same name was removed. Continue taking this medication, and follow the directions you see here.   VITAMIN D3 PO Take 1 tablet by mouth daily.       Allergies  Allergen Reactions   Penicillins     Diffuse joint pain @ age 81 in context of Scarlet Fever  Did it involve swelling of the face/tongue/throat, SOB, or low BP? No Did it involve sudden or severe  rash/hives, skin peeling, or any reaction on the inside of your mouth or nose? No Did you need to seek medical attention at a hospital or doctor's office? No When did it last happen?  childhood     If all above answers are "NO", may proceed with cephalosporin use.        The results of significant diagnostics from this hospitalization (including imaging, microbiology, ancillary and laboratory) are listed below for reference.    Significant Diagnostic Studies: DG FEMUR PORT MIN 2 VIEWS LEFT  Result Date: 06/02/2022 CLINICAL DATA:  Postoperative. Closed intertrochanteric fracture left hip. EXAM: LEFT FEMUR PORTABLE 2 VIEWS COMPARISON:  Left hip radiographs 06/01/2022 FINDINGS: Interval left intramedullary nail with 2 proximal femoral neck interlocking screws fixation of the previously seen intertrochanteric fracture. There is improved alignment with resolution of the  prior varus angulation. Minimal superior displacement of the distal fracture component with respect to the proximal fracture component. The lesser trochanter is again medially displaced. No evidence of hardware failure. Expected postoperative changes including lateral hip soft tissue swelling and subcutaneous air. Status post total left knee arthroplasty. Severe superior left femoroacetabular osteoarthritis. Moderate vascular calcifications. IMPRESSION: Interval ORIF of the prior left femoral intertrochanteric fracture with improved alignment. No evidence of hardware failure. Electronically Signed   By: Yvonne Kendall M.D.   On: 06/02/2022 15:06   DG FEMUR MIN 2 VIEWS LEFT  Result Date: 06/02/2022 CLINICAL DATA:  Intramedullary nail EXAM: LEFT FEMUR 2 VIEWS; DG C-ARM 1-60 MIN-NO REPORT COMPARISON:  05/31/2022 FLUOROSCOPY: Air kerma 45.49 mGy FINDINGS: Intraoperative fluoroscopic images of the left femur demonstrate intramedullary nail fixation of intratrochanteric fractures of the left femur. Near anatomic alignment of fracture fragments.  No obvious perihardware fracture or component malpositioning. Pre-existing left knee arthroplasty. IMPRESSION: Intraoperative fluoroscopic images of the left femur demonstrate intramedullary nail fixation of intratrochanteric fractures of the left femur. Near anatomic alignment of fracture fragments. No obvious perihardware fracture or component malpositioning. Electronically Signed   By: Delanna Ahmadi M.D.   On: 06/02/2022 13:21   DG C-Arm 1-60 Min-No Report  Result Date: 06/02/2022 Fluoroscopy was utilized by the requesting physician.  No radiographic interpretation.   DG C-Arm 1-60 Min-No Report  Result Date: 06/02/2022 Fluoroscopy was utilized by the requesting physician.  No radiographic interpretation.   DG Knee Left Port  Result Date: 06/01/2022 CLINICAL DATA:  Pain after a fall. EXAM: PORTABLE LEFT KNEE - 1-2 VIEW COMPARISON:  06/27/2005 FINDINGS: Postoperative left total knee arthroplasty including patellar femoral component. Components appear well seated. No acute displaced fractures are identified. No focal bone lesion or bone destruction. No significant effusion. Soft tissues are unremarkable. Vascular calcifications. IMPRESSION: Left total knee arthroplasty. Components appear well seated. No acute displaced fractures. Electronically Signed   By: Lucienne Capers M.D.   On: 06/01/2022 22:18   DG Chest Portable 1 View  Result Date: 06/01/2022 CLINICAL DATA:  Pain after a fall. EXAM: PORTABLE CHEST 1 VIEW COMPARISON:  None Available. FINDINGS: Shallow inspiration. Cardiac enlargement. No vascular congestion, edema, or consolidation. No pleural effusions. No pneumothorax. Mediastinal contours appear intact. IMPRESSION: No active disease. Electronically Signed   By: Lucienne Capers M.D.   On: 06/01/2022 22:17   DG Ankle 2 Views Left  Result Date: 06/01/2022 CLINICAL DATA:  Pain after a fall. EXAM: LEFT ANKLE - 2 VIEW COMPARISON:  None Available. FINDINGS: Nonstandard positioning limits  evaluation. As visualized, no evidence of acute fracture or dislocation. Degenerative changes are present in the intertarsal joints. Vascular calcifications in the soft tissues. IMPRESSION: No acute fractures identified.  Degenerative changes. Electronically Signed   By: Lucienne Capers M.D.   On: 06/01/2022 22:16   DG Hip Unilat W or Wo Pelvis 2-3 Views Left  Result Date: 06/01/2022 CLINICAL DATA:  Pain after a fall. EXAM: DG HIP (WITH OR WITHOUT PELVIS) 2-3V LEFT COMPARISON:  None Available. FINDINGS: Comminuted inter trochanteric fractures of the left proximal femur with displaced lesser trochanteric fragment and varus angulation of the hip. No dislocation. Pelvis and right hip appear intact. Vascular calcifications are present. IMPRESSION: Comminuted inter trochanteric fractures of the left hip with varus angulation and displacement of the lesser trochanteric fragment. Electronically Signed   By: Lucienne Capers M.D.   On: 06/01/2022 22:16   CT Cervical Spine Wo Contrast  Result Date: 06/01/2022 CLINICAL DATA:  Status post fall. EXAM: CT CERVICAL SPINE WITHOUT CONTRAST TECHNIQUE: Multidetector CT imaging of the cervical spine was performed without intravenous contrast. Multiplanar CT image reconstructions were also generated. RADIATION DOSE REDUCTION: This exam was performed according to the departmental dose-optimization program which includes automated exposure control, adjustment of the mA and/or kV according to patient size and/or use of iterative reconstruction technique. COMPARISON:  None Available. FINDINGS: Alignment: Normal. Skull base and vertebrae: No acute fracture. Chronic and degenerative changes are seen along the tip of the dens. Soft tissues and spinal canal: No prevertebral fluid or swelling. No visible canal hematoma. Disc levels: Mild endplate sclerosis and moderate to marked severity anterior osteophyte formation are seen at the levels of C2-C3, C3-C4, C4-C5, C5-C6, C6-C7 C7-T1.  There is marked severity narrowing of the anterior atlantoaxial articulation. Mild to moderate severity intervertebral disc space narrowing is seen at C2-C3, C3-C4, C4-C5, C5-C6, C6-C7 and C7-T1. Bilateral moderate to marked severity multilevel facet joint hypertrophy is noted. Upper chest: Negative. Other: None. IMPRESSION: 1. No acute fracture or subluxation in the cervical spine. 2. Mild to moderate severity multilevel degenerative changes, as described above. Electronically Signed   By: Virgina Norfolk M.D.   On: 06/01/2022 21:28   CT Head Wo Contrast  Result Date: 06/01/2022 CLINICAL DATA:  Status post fall. EXAM: CT HEAD WITHOUT CONTRAST TECHNIQUE: Contiguous axial images were obtained from the base of the skull through the vertex without intravenous contrast. RADIATION DOSE REDUCTION: This exam was performed according to the departmental dose-optimization program which includes automated exposure control, adjustment of the mA and/or kV according to patient size and/or use of iterative reconstruction technique. COMPARISON:  None Available. FINDINGS: Brain: There is mild cerebral atrophy with widening of the extra-axial spaces and ventricular dilatation. There are areas of decreased attenuation within the white matter tracts of the supratentorial brain, consistent with microvascular disease changes. Small, chronic bilateral basal ganglia lacunar infarcts are noted. Vascular: No hyperdense vessel or unexpected calcification. Skull: Normal. Negative for fracture or focal lesion. Sinuses/Orbits: No acute finding. Other: None. IMPRESSION: 1. No acute intracranial abnormality. 2. Generalized cerebral atrophy with chronic white matter small vessel ischemic changes. Electronically Signed   By: Virgina Norfolk M.D.   On: 06/01/2022 21:14    Microbiology: Recent Results (from the past 240 hour(s))  Surgical pcr screen     Status: Abnormal   Collection Time: 06/02/22  8:16 AM   Specimen: Nasal Mucosa; Nasal  Swab  Result Value Ref Range Status   MRSA, PCR NEGATIVE NEGATIVE Final   Staphylococcus aureus POSITIVE (A) NEGATIVE Final    Comment: (NOTE) The Xpert SA Assay (FDA approved for NASAL specimens in patients 77 years of age and older), is one component of a comprehensive surveillance program. It is not intended to diagnose infection nor to guide or monitor treatment. Performed at [facility], [place] [address] [place], [place] [address]      Labs: Basic Metabolic Panel: Recent Labs  Lab 06/01/22 2037 06/03/22 0645  NA 135 135  K 3.9 4.3  CL 102 102  CO2 22 22  GLUCOSE 269* 172*  BUN 27* 38*  CREATININE 1.22* 1.38*  CALCIUM 9.1 8.3*   Liver Function Tests: No results for input(s): "AST", "ALT", "ALKPHOS", "BILITOT", "PROT", "ALBUMIN" in the last 168 hours. No results for input(s): "LIPASE", "AMYLASE" in the last 168 hours. No results for input(s): "AMMONIA" in the last 168 hours. CBC: Recent Labs  Lab 06/01/22 2037 06/03/22 0645 06/04/22 0756  WBC 10.8* 11.3* 9.5  NEUTROABS 9.5*  --  6.5  HGB 14.4 10.1* 9.6*  HCT 43.4 29.6* 27.7*  MCV 87.9 86.0 85.8  PLT 298 237 236   Cardiac Enzymes: No results for input(s): "CKTOTAL", "CKMB", "CKMBINDEX", "TROPONINI" in the last 168 hours. BNP: BNP (last 3 results) No results for input(s): "BNP" in the last 8760 hours.  ProBNP (last 3 results) No results for input(s): "PROBNP" in the last 8760 hours.  CBG: Recent Labs  Lab 06/04/22 0806 06/04/22 1126 06/04/22 1557 06/04/22 2224 06/05/22 0816  GLUCAP 144* 243* 143* 160* 134*       Signed:  Nita Sells MD   [facility] 06/05/2022, 8:54 AM

## 2022-06-05 NOTE — TOC Progression Note (Addendum)
Transition of Care [facility]) - Progression Note    Patient Details  Name: Danielle Murray MRN: [id number] Date of Birth: July 16, 1941  [facility]) CM/SW Contact  Joanne Chars, LCSW Phone Number: 06/05/2022, 9:36 AM  Clinical Narrative:   CSW presented bed offers to pt and husband, they would like to accept offer at [facility], 2nd choice is [facility].  CSW waiting for confirmation from [facility].  Auth request made to [facility].   1200: auth approved by [facility]: 7 days SNF: [id number], PTAR: [id number].  Confirmed with [facility] that they can receive pt today.   Expected Discharge Plan: [place] Barriers to Discharge: Continued Medical Work up, SNF Pending bed offer  Expected Discharge Plan and Services Expected Discharge Plan: [place] In-house Referral: Clinical Social Work   Post Acute Care Choice: [place] arrangements for the past 2 months: Single Family Home Expected Discharge Date: 06/05/22                                     Social Determinants of Health (SDOH) Interventions    Readmission Risk Interventions     No data to display

## 2022-06-05 NOTE — TOC Transition Note (Signed)
Transition of Care [facility]) - CM/SW Discharge Note   Patient Details  Name: Danielle Murray MRN: [id number] Date of Birth: 09/14/1941  Transition of Care [facility]) CM/SW Contact:  Joanne Chars, LCSW Phone Number: 06/05/2022, 1:17 PM   Clinical Narrative:  Pt discharging to [facility].  RN call report to [phone].      Final next level of care: Skilled Nursing Facility Barriers to Discharge: Barriers Resolved   Patient Goals and CMS Choice Patient states their goals for this hospitalization and ongoing recovery are:: walk with a cane CMS Medicare.gov Compare Post Acute Care list provided to:: Patient Choice offered to / list presented to : Patient  Discharge Placement              Patient chooses bed at:  ([facility]) Patient to be transferred to facility by: [facility] Name of family member notified: husband Marcello Moores in room Patient and family notified of of transfer: 06/05/22  Discharge Plan and Services In-house Referral: Clinical Social Work   Post Acute Care Choice: [place]                               Social Determinants of Health ([place]) Interventions     Readmission Risk Interventions     No data to display

## 2022-06-05 NOTE — Progress Notes (Signed)
Called [facility] to give report, all nurse are in meeting a message was left with unit secretary and admissions Mudlogger

## 2022-06-05 NOTE — Plan of Care (Signed)

## 2022-06-19 ENCOUNTER — Encounter (HOSPITAL_COMMUNITY): Payer: Self-pay

## 2022-06-19 ENCOUNTER — Emergency Department (HOSPITAL_COMMUNITY): Payer: PPO

## 2022-06-19 ENCOUNTER — Other Ambulatory Visit: Payer: Self-pay

## 2022-06-19 ENCOUNTER — Inpatient Hospital Stay (HOSPITAL_COMMUNITY)
Admission: EM | Admit: 2022-06-19 | Discharge: 2022-06-28 | DRG: 246 | Disposition: A | Discharge status: Skilled Nursing Facility | Payer: PPO | Attending: Internal Medicine | Admitting: Internal Medicine

## 2022-06-19 DIAGNOSIS — Z96652 Presence of left artificial knee joint: Secondary | ICD-10-CM | POA: Diagnosis present

## 2022-06-19 DIAGNOSIS — J189 Pneumonia, unspecified organism: Secondary | ICD-10-CM | POA: Diagnosis not present

## 2022-06-19 DIAGNOSIS — N1831 Chronic kidney disease, stage 3a: Secondary | ICD-10-CM | POA: Diagnosis present

## 2022-06-19 DIAGNOSIS — Z79899 Other long term (current) drug therapy: Secondary | ICD-10-CM | POA: Diagnosis not present

## 2022-06-19 DIAGNOSIS — Z7984 Long term (current) use of oral hypoglycemic drugs: Secondary | ICD-10-CM

## 2022-06-19 DIAGNOSIS — E039 Hypothyroidism, unspecified: Secondary | ICD-10-CM | POA: Diagnosis not present

## 2022-06-19 DIAGNOSIS — Z7389 Other problems related to life management difficulty: Secondary | ICD-10-CM | POA: Diagnosis not present

## 2022-06-19 DIAGNOSIS — R062 Wheezing: Secondary | ICD-10-CM | POA: Diagnosis not present

## 2022-06-19 DIAGNOSIS — R918 Other nonspecific abnormal finding of lung field: Secondary | ICD-10-CM | POA: Diagnosis not present

## 2022-06-19 DIAGNOSIS — Z20822 Contact with and (suspected) exposure to covid-19: Secondary | ICD-10-CM | POA: Diagnosis not present

## 2022-06-19 DIAGNOSIS — I214 Non-ST elevation (NSTEMI) myocardial infarction: Secondary | ICD-10-CM

## 2022-06-19 DIAGNOSIS — A419 Sepsis, unspecified organism: Secondary | ICD-10-CM | POA: Diagnosis not present

## 2022-06-19 DIAGNOSIS — R509 Fever, unspecified: Secondary | ICD-10-CM | POA: Diagnosis not present

## 2022-06-19 DIAGNOSIS — I10 Essential (primary) hypertension: Secondary | ICD-10-CM | POA: Diagnosis not present

## 2022-06-19 DIAGNOSIS — Z955 Presence of coronary angioplasty implant and graft: Secondary | ICD-10-CM

## 2022-06-19 DIAGNOSIS — Z79890 Hormone replacement therapy (postmenopausal): Secondary | ICD-10-CM

## 2022-06-19 DIAGNOSIS — R0600 Dyspnea, unspecified: Secondary | ICD-10-CM | POA: Diagnosis not present

## 2022-06-19 DIAGNOSIS — E782 Mixed hyperlipidemia: Secondary | ICD-10-CM | POA: Diagnosis not present

## 2022-06-19 DIAGNOSIS — I5041 Acute combined systolic (congestive) and diastolic (congestive) heart failure: Secondary | ICD-10-CM | POA: Diagnosis not present

## 2022-06-19 DIAGNOSIS — I517 Cardiomegaly: Secondary | ICD-10-CM | POA: Diagnosis not present

## 2022-06-19 DIAGNOSIS — E038 Other specified hypothyroidism: Secondary | ICD-10-CM | POA: Diagnosis not present

## 2022-06-19 DIAGNOSIS — I255 Ischemic cardiomyopathy: Secondary | ICD-10-CM | POA: Diagnosis present

## 2022-06-19 DIAGNOSIS — R0689 Other abnormalities of breathing: Secondary | ICD-10-CM | POA: Diagnosis not present

## 2022-06-19 DIAGNOSIS — R0602 Shortness of breath: Secondary | ICD-10-CM | POA: Diagnosis not present

## 2022-06-19 DIAGNOSIS — R41841 Cognitive communication deficit: Secondary | ICD-10-CM | POA: Diagnosis not present

## 2022-06-19 DIAGNOSIS — E559 Vitamin D deficiency, unspecified: Secondary | ICD-10-CM | POA: Diagnosis not present

## 2022-06-19 DIAGNOSIS — I509 Heart failure, unspecified: Secondary | ICD-10-CM | POA: Diagnosis not present

## 2022-06-19 DIAGNOSIS — L8946 Pressure-induced deep tissue damage of contiguous site of back, buttock and hip: Secondary | ICD-10-CM | POA: Diagnosis not present

## 2022-06-19 DIAGNOSIS — L89322 Pressure ulcer of left buttock, stage 2: Secondary | ICD-10-CM | POA: Diagnosis not present

## 2022-06-19 DIAGNOSIS — I5043 Acute on chronic combined systolic (congestive) and diastolic (congestive) heart failure: Secondary | ICD-10-CM | POA: Diagnosis present

## 2022-06-19 DIAGNOSIS — I130 Hypertensive heart and chronic kidney disease with heart failure and stage 1 through stage 4 chronic kidney disease, or unspecified chronic kidney disease: Secondary | ICD-10-CM | POA: Diagnosis present

## 2022-06-19 DIAGNOSIS — I2510 Atherosclerotic heart disease of native coronary artery without angina pectoris: Secondary | ICD-10-CM | POA: Diagnosis not present

## 2022-06-19 DIAGNOSIS — E1122 Type 2 diabetes mellitus with diabetic chronic kidney disease: Secondary | ICD-10-CM | POA: Diagnosis not present

## 2022-06-19 DIAGNOSIS — N390 Urinary tract infection, site not specified: Secondary | ICD-10-CM | POA: Diagnosis not present

## 2022-06-19 DIAGNOSIS — Z7902 Long term (current) use of antithrombotics/antiplatelets: Secondary | ICD-10-CM

## 2022-06-19 DIAGNOSIS — J9601 Acute respiratory failure with hypoxia: Secondary | ICD-10-CM | POA: Diagnosis not present

## 2022-06-19 DIAGNOSIS — E113293 Type 2 diabetes mellitus with mild nonproliferative diabetic retinopathy without macular edema, bilateral: Secondary | ICD-10-CM | POA: Diagnosis not present

## 2022-06-19 DIAGNOSIS — I252 Old myocardial infarction: Secondary | ICD-10-CM

## 2022-06-19 DIAGNOSIS — Y95 Nosocomial condition: Secondary | ICD-10-CM | POA: Diagnosis present

## 2022-06-19 DIAGNOSIS — E669 Obesity, unspecified: Secondary | ICD-10-CM | POA: Diagnosis present

## 2022-06-19 DIAGNOSIS — R600 Localized edema: Secondary | ICD-10-CM | POA: Diagnosis not present

## 2022-06-19 DIAGNOSIS — Z880 Allergy status to penicillin: Secondary | ICD-10-CM | POA: Diagnosis not present

## 2022-06-19 DIAGNOSIS — Z8249 Family history of ischemic heart disease and other diseases of the circulatory system: Secondary | ICD-10-CM | POA: Diagnosis not present

## 2022-06-19 DIAGNOSIS — I272 Pulmonary hypertension, unspecified: Secondary | ICD-10-CM | POA: Diagnosis present

## 2022-06-19 DIAGNOSIS — Z6832 Body mass index (BMI) 32.0-32.9, adult: Secondary | ICD-10-CM

## 2022-06-19 DIAGNOSIS — I340 Nonrheumatic mitral (valve) insufficiency: Secondary | ICD-10-CM | POA: Diagnosis not present

## 2022-06-19 DIAGNOSIS — I519 Heart disease, unspecified: Secondary | ICD-10-CM | POA: Diagnosis not present

## 2022-06-19 DIAGNOSIS — Z823 Family history of stroke: Secondary | ICD-10-CM

## 2022-06-19 DIAGNOSIS — G4700 Insomnia, unspecified: Secondary | ICD-10-CM | POA: Diagnosis not present

## 2022-06-19 DIAGNOSIS — R531 Weakness: Secondary | ICD-10-CM | POA: Diagnosis not present

## 2022-06-19 DIAGNOSIS — R0902 Hypoxemia: Secondary | ICD-10-CM | POA: Diagnosis not present

## 2022-06-19 DIAGNOSIS — I739 Peripheral vascular disease, unspecified: Secondary | ICD-10-CM | POA: Diagnosis not present

## 2022-06-19 DIAGNOSIS — B9620 Unspecified Escherichia coli [E. coli] as the cause of diseases classified elsewhere: Secondary | ICD-10-CM | POA: Diagnosis present

## 2022-06-19 DIAGNOSIS — Z794 Long term (current) use of insulin: Secondary | ICD-10-CM

## 2022-06-19 DIAGNOSIS — I5021 Acute systolic (congestive) heart failure: Secondary | ICD-10-CM | POA: Diagnosis not present

## 2022-06-19 DIAGNOSIS — D62 Acute posthemorrhagic anemia: Secondary | ICD-10-CM | POA: Diagnosis present

## 2022-06-19 DIAGNOSIS — Z7401 Bed confinement status: Secondary | ICD-10-CM | POA: Diagnosis not present

## 2022-06-19 DIAGNOSIS — F424 Excoriation (skin-picking) disorder: Secondary | ICD-10-CM | POA: Diagnosis not present

## 2022-06-19 DIAGNOSIS — I25110 Atherosclerotic heart disease of native coronary artery with unstable angina pectoris: Secondary | ICD-10-CM | POA: Diagnosis not present

## 2022-06-19 DIAGNOSIS — M159 Polyosteoarthritis, unspecified: Secondary | ICD-10-CM | POA: Diagnosis present

## 2022-06-19 DIAGNOSIS — I959 Hypotension, unspecified: Secondary | ICD-10-CM | POA: Diagnosis not present

## 2022-06-19 DIAGNOSIS — M6281 Muscle weakness (generalized): Secondary | ICD-10-CM | POA: Diagnosis not present

## 2022-06-19 DIAGNOSIS — E785 Hyperlipidemia, unspecified: Secondary | ICD-10-CM | POA: Diagnosis present

## 2022-06-19 DIAGNOSIS — S72143D Displaced intertrochanteric fracture of unspecified femur, subsequent encounter for closed fracture with routine healing: Secondary | ICD-10-CM | POA: Diagnosis not present

## 2022-06-19 DIAGNOSIS — Z83438 Family history of other disorder of lipoprotein metabolism and other lipidemia: Secondary | ICD-10-CM

## 2022-06-19 DIAGNOSIS — L8990 Pressure ulcer of unspecified site, unspecified stage: Secondary | ICD-10-CM | POA: Insufficient documentation

## 2022-06-19 DIAGNOSIS — R262 Difficulty in walking, not elsewhere classified: Secondary | ICD-10-CM | POA: Diagnosis not present

## 2022-06-19 DIAGNOSIS — E1165 Type 2 diabetes mellitus with hyperglycemia: Secondary | ICD-10-CM | POA: Diagnosis not present

## 2022-06-19 DIAGNOSIS — Z9889 Other specified postprocedural states: Secondary | ICD-10-CM

## 2022-06-19 DIAGNOSIS — S72142D Displaced intertrochanteric fracture of left femur, subsequent encounter for closed fracture with routine healing: Secondary | ICD-10-CM | POA: Diagnosis not present

## 2022-06-19 DIAGNOSIS — Z825 Family history of asthma and other chronic lower respiratory diseases: Secondary | ICD-10-CM

## 2022-06-19 DIAGNOSIS — Z833 Family history of diabetes mellitus: Secondary | ICD-10-CM

## 2022-06-19 DIAGNOSIS — K5900 Constipation, unspecified: Secondary | ICD-10-CM | POA: Diagnosis not present

## 2022-06-19 LAB — URINALYSIS, ROUTINE W REFLEX MICROSCOPIC
Bilirubin Urine: NEGATIVE
Glucose, UA: >=500 mg/dL — AB
Hgb urine dipstick: NEGATIVE
Ketones, ur: 5 mg/dL — AB
Nitrite: NEGATIVE
Protein, ur: 30 mg/dL — AB
Specific Gravity, Urine: 1.017 (ref 1.005–1.030)
WBC, UA: >50 WBC/hpf — ABNORMAL HIGH (ref 0–5)
pH: 5 (ref 5.0–8.0)

## 2022-06-19 LAB — COMPREHENSIVE METABOLIC PANEL
ALT: 12 U/L (ref 0–44)
AST: 22 U/L (ref 15–41)
Albumin: 2.5 g/dL — ABNORMAL LOW (ref 3.5–5.0)
Alkaline Phosphatase: 111 U/L (ref 38–126)
Anion gap: 15 (ref 5–15)
BUN: 36 mg/dL — ABNORMAL HIGH (ref 8–23)
CO2: 17 mmol/L — ABNORMAL LOW (ref 22–32)
Calcium: 8.1 mg/dL — ABNORMAL LOW (ref 8.9–10.3)
Chloride: 102 mmol/L (ref 98–111)
Creatinine, Ser: 1.45 mg/dL — ABNORMAL HIGH (ref 0.44–1.00)
GFR, Estimated: 36 mL/min — ABNORMAL LOW (ref >60)
Glucose, Bld: 180 mg/dL — ABNORMAL HIGH (ref 70–99)
Potassium: 3.7 mmol/L (ref 3.5–5.1)
Sodium: 134 mmol/L — ABNORMAL LOW (ref 135–145)
Total Bilirubin: 1.7 mg/dL — ABNORMAL HIGH (ref 0.3–1.2)
Total Protein: 6.3 g/dL — ABNORMAL LOW (ref 6.5–8.1)

## 2022-06-19 LAB — CBC WITH DIFFERENTIAL/PLATELET
Abs Immature Granulocytes: 0.05 10*3/uL (ref 0.00–0.07)
Basophils Absolute: 0.1 10*3/uL (ref 0.0–0.1)
Basophils Relative: 1 %
Eosinophils Absolute: 0 10*3/uL (ref 0.0–0.5)
Eosinophils Relative: 0 %
HCT: 31.1 % — ABNORMAL LOW (ref 36.0–46.0)
Hemoglobin: 9.7 g/dL — ABNORMAL LOW (ref 12.0–15.0)
Immature Granulocytes: 0 %
Lymphocytes Relative: 3 %
Lymphs Abs: 0.3 10*3/uL — ABNORMAL LOW (ref 0.7–4.0)
MCH: 28.4 pg (ref 26.0–34.0)
MCHC: 31.2 g/dL (ref 30.0–36.0)
MCV: 90.9 fL (ref 80.0–100.0)
Monocytes Absolute: 0.4 10*3/uL (ref 0.1–1.0)
Monocytes Relative: 3 %
Neutro Abs: 12 10*3/uL — ABNORMAL HIGH (ref 1.7–7.7)
Neutrophils Relative %: 93 %
Platelets: 413 10*3/uL — ABNORMAL HIGH (ref 150–400)
RBC: 3.42 MIL/uL — ABNORMAL LOW (ref 3.87–5.11)
RDW: 14.5 % (ref 11.5–15.5)
WBC: 12.9 10*3/uL — ABNORMAL HIGH (ref 4.0–10.5)
nRBC: 0 % (ref 0.0–0.2)

## 2022-06-19 LAB — ECHOCARDIOGRAM COMPLETE
AR max vel: 1.67 cm2
AV Area VTI: 1.9 cm2
AV Area mean vel: 1.87 cm2
AV Mean grad: 4 mmHg
AV Peak grad: 9 mmHg
Ao pk vel: 1.5 m/s
Area-P 1/2: 3.06 cm2
Calc EF: 51.5 %
Height: 64 in
MV VTI: 1.65 cm2
S' Lateral: 3.3 cm
Single Plane A2C EF: 50.9 %
Single Plane A4C EF: 51.2 %
Weight: 2992 oz

## 2022-06-19 LAB — HEPARIN LEVEL (UNFRACTIONATED): Heparin Unfractionated: 0.36 IU/mL (ref 0.30–0.70)

## 2022-06-19 LAB — BRAIN NATRIURETIC PEPTIDE: B Natriuretic Peptide: 1579.3 pg/mL — ABNORMAL HIGH (ref 0.0–100.0)

## 2022-06-19 LAB — I-STAT CHEM 8, ED
BUN: 32 mg/dL — ABNORMAL HIGH (ref 8–23)
Calcium, Ion: 1.01 mmol/L — ABNORMAL LOW (ref 1.15–1.40)
Chloride: 102 mmol/L (ref 98–111)
Creatinine, Ser: 1.3 mg/dL — ABNORMAL HIGH (ref 0.44–1.00)
Glucose, Bld: 178 mg/dL — ABNORMAL HIGH (ref 70–99)
HCT: 29 % — ABNORMAL LOW (ref 36.0–46.0)
Hemoglobin: 9.9 g/dL — ABNORMAL LOW (ref 12.0–15.0)
Potassium: 3.7 mmol/L (ref 3.5–5.1)
Sodium: 134 mmol/L — ABNORMAL LOW (ref 135–145)
TCO2: 18 mmol/L — ABNORMAL LOW (ref 22–32)

## 2022-06-19 LAB — CBG MONITORING, ED
Glucose-Capillary: 174 mg/dL — ABNORMAL HIGH (ref 70–99)
Glucose-Capillary: 143 mg/dL — ABNORMAL HIGH (ref 70–99)

## 2022-06-19 LAB — RESP PANEL BY RT-PCR (FLU A&B, COVID) ARPGX2
Influenza A by PCR: NEGATIVE
Influenza B by PCR: NEGATIVE
SARS Coronavirus 2 by RT PCR: NEGATIVE

## 2022-06-19 LAB — IRON AND TIBC
Iron: 10 ug/dL — ABNORMAL LOW (ref 28–170)
Saturation Ratios: 4 % — ABNORMAL LOW (ref 10.4–31.8)
TIBC: 237 ug/dL — ABNORMAL LOW (ref 250–450)
UIBC: 227 ug/dL

## 2022-06-19 LAB — TROPONIN I (HIGH SENSITIVITY)
Troponin I (High Sensitivity): 2421 ng/L (ref <18)
Troponin I (High Sensitivity): 2139 ng/L (ref <18)

## 2022-06-19 LAB — LACTIC ACID, PLASMA
Lactic Acid, Venous: 1.2 mmol/L (ref 0.5–1.9)
Lactic Acid, Venous: 1.1 mmol/L (ref 0.5–1.9)

## 2022-06-19 LAB — HEMOGLOBIN A1C
Hgb A1c MFr Bld: 7.5 % — ABNORMAL HIGH (ref 4.8–5.6)
Mean Plasma Glucose: 168.55 mg/dL

## 2022-06-19 LAB — APTT: aPTT: 33 seconds (ref 24–36)

## 2022-06-19 LAB — PROTIME-INR
INR: 1.2 (ref 0.8–1.2)
Prothrombin Time: 15.5 seconds — ABNORMAL HIGH (ref 11.4–15.2)

## 2022-06-19 MED ORDER — ROSUVASTATIN CALCIUM 5 MG PO TABS
5.0000 mg | ORAL_TABLET | Freq: Every day | ORAL | Status: DC
  Administered 2022-06-19 – 2022-06-20 (×2): 5 mg via ORAL
  Filled 2022-06-19 (×2): qty 1

## 2022-06-19 MED ORDER — ISOSORBIDE MONONITRATE ER 30 MG PO TB24
30.0000 mg | ORAL_TABLET | Freq: Every day | ORAL | Status: DC
  Administered 2022-06-19 – 2022-06-28 (×8): 30 mg via ORAL
  Filled 2022-06-19 (×9): qty 1

## 2022-06-19 MED ORDER — DOCUSATE SODIUM 100 MG PO CAPS
100.0000 mg | ORAL_CAPSULE | Freq: Two times a day (BID) | ORAL | Status: DC
Start: 2022-06-19 — End: 2022-06-27
  Administered 2022-06-19 – 2022-06-26 (×5): 100 mg via ORAL
  Filled 2022-06-19 (×12): qty 1

## 2022-06-19 MED ORDER — INSULIN ASPART 100 UNIT/ML IJ SOLN
0.0000 [IU] | Freq: Three times a day (TID) | INTRAMUSCULAR | Status: DC
  Administered 2022-06-19: 2 [IU] via SUBCUTANEOUS
  Administered 2022-06-20: 11 [IU] via SUBCUTANEOUS
  Administered 2022-06-20: 3 [IU] via SUBCUTANEOUS
  Administered 2022-06-21: 2 [IU] via SUBCUTANEOUS
  Administered 2022-06-22 (×2): 3 [IU] via SUBCUTANEOUS
  Administered 2022-06-23: 2 [IU] via SUBCUTANEOUS
  Administered 2022-06-23: 3 [IU] via SUBCUTANEOUS
  Administered 2022-06-23: 8 [IU] via SUBCUTANEOUS
  Administered 2022-06-24: 3 [IU] via SUBCUTANEOUS
  Administered 2022-06-24 (×2): 5 [IU] via SUBCUTANEOUS
  Administered 2022-06-25: 3 [IU] via SUBCUTANEOUS
  Administered 2022-06-25: 2 [IU] via SUBCUTANEOUS
  Administered 2022-06-25 – 2022-06-26 (×4): 3 [IU] via SUBCUTANEOUS
  Administered 2022-06-27: 5 [IU] via SUBCUTANEOUS
  Administered 2022-06-27 – 2022-06-28 (×3): 3 [IU] via SUBCUTANEOUS
  Administered 2022-06-28: 2 [IU] via SUBCUTANEOUS
  Administered 2022-06-28: 3 [IU] via SUBCUTANEOUS

## 2022-06-19 MED ORDER — SODIUM CHLORIDE 0.9 % IV BOLUS
500.0000 mL | Freq: Once | INTRAVENOUS | Status: AC
Start: 2022-06-19 — End: 2022-06-19
  Administered 2022-06-19: 500 mL via INTRAVENOUS

## 2022-06-19 MED ORDER — POLYETHYLENE GLYCOL 3350 17 G PO PACK: 17.0000 g | PACK | Freq: Every day | ORAL | Status: DC | PRN

## 2022-06-19 MED ORDER — PERFLUTREN LIPID MICROSPHERE
1.0000 mL | INTRAVENOUS | Status: DC | PRN
  Administered 2022-06-19: 6 mL via INTRAVENOUS

## 2022-06-19 MED ORDER — SODIUM CHLORIDE 0.9 % IV SOLN
2.0000 g | INTRAVENOUS | Status: DC
  Administered 2022-06-20: 2 g via INTRAVENOUS
  Filled 2022-06-19: qty 20

## 2022-06-19 MED ORDER — HEPARIN (PORCINE) 25000 UT/250ML-% IV SOLN
1450.0000 [IU]/h | INTRAVENOUS | Status: DC
  Administered 2022-06-19: 1000 [IU]/h via INTRAVENOUS
  Administered 2022-06-20: 1450 [IU]/h via INTRAVENOUS
  Administered 2022-06-20: 1150 [IU]/h via INTRAVENOUS
  Filled 2022-06-19 (×3): qty 250

## 2022-06-19 MED ORDER — ALBUTEROL SULFATE (2.5 MG/3ML) 0.083% IN NEBU
2.5000 mg | INHALATION_SOLUTION | RESPIRATORY_TRACT | Status: DC | PRN
  Administered 2022-06-20 (×2): 2.5 mg via RESPIRATORY_TRACT
  Filled 2022-06-19 (×2): qty 3

## 2022-06-19 MED ORDER — INSULIN GLARGINE-YFGN 100 UNIT/ML ~~LOC~~ SOLN
15.0000 [IU] | Freq: Two times a day (BID) | SUBCUTANEOUS | Status: DC
  Administered 2022-06-19 – 2022-06-28 (×17): 15 [IU] via SUBCUTANEOUS
  Filled 2022-06-19 (×20): qty 0.15

## 2022-06-19 MED ORDER — ACETAMINOPHEN 650 MG RE SUPP: 650.0000 mg | Freq: Four times a day (QID) | RECTAL | Status: DC | PRN

## 2022-06-19 MED ORDER — MEDIHONEY WOUND/BURN DRESSING EX PSTE
1.0000 | PASTE | Freq: Every day | CUTANEOUS | Status: DC
  Administered 2022-06-19 – 2022-06-28 (×9): 1 via TOPICAL
  Filled 2022-06-19: qty 44

## 2022-06-19 MED ORDER — BISACODYL 5 MG PO TBEC: 5.0000 mg | DELAYED_RELEASE_TABLET | Freq: Every day | ORAL | Status: DC | PRN

## 2022-06-19 MED ORDER — HEPARIN BOLUS VIA INFUSION
4000.0000 [IU] | Freq: Once | INTRAVENOUS | Status: AC
  Administered 2022-06-19: 4000 [IU] via INTRAVENOUS
  Filled 2022-06-19: qty 4000

## 2022-06-19 MED ORDER — INSULIN ASPART 100 UNIT/ML IJ SOLN
0.0000 [IU] | Freq: Every day | INTRAMUSCULAR | Status: DC
  Administered 2022-06-23: 3 [IU] via SUBCUTANEOUS
  Administered 2022-06-24 – 2022-06-27 (×3): 2 [IU] via SUBCUTANEOUS

## 2022-06-19 MED ORDER — ONDANSETRON HCL 4 MG/2ML IJ SOLN: 4.0000 mg | Freq: Four times a day (QID) | INTRAMUSCULAR | Status: DC | PRN

## 2022-06-19 MED ORDER — SODIUM CHLORIDE 0.9 % IV SOLN: 25.0000 mg | Freq: Once | INTRAVENOUS | Status: DC

## 2022-06-19 MED ORDER — ACETAMINOPHEN 325 MG PO TABS: 650.0000 mg | ORAL_TABLET | Freq: Four times a day (QID) | ORAL | Status: DC | PRN

## 2022-06-19 MED ORDER — HYDRALAZINE HCL 20 MG/ML IJ SOLN: 5.0000 mg | INTRAMUSCULAR | Status: DC | PRN

## 2022-06-19 MED ORDER — SODIUM CHLORIDE 0.9% FLUSH
3.0000 mL | Freq: Two times a day (BID) | INTRAVENOUS | Status: DC
  Administered 2022-06-19 – 2022-06-26 (×8): 3 mL via INTRAVENOUS

## 2022-06-19 MED ORDER — AMLODIPINE BESYLATE 5 MG PO TABS
5.0000 mg | ORAL_TABLET | Freq: Every day | ORAL | Status: DC
  Administered 2022-06-20 – 2022-06-22 (×2): 5 mg via ORAL
  Filled 2022-06-19 (×2): qty 1

## 2022-06-19 MED ORDER — VANCOMYCIN HCL 1500 MG/300ML IV SOLN
1500.0000 mg | Freq: Once | INTRAVENOUS | Status: AC
  Administered 2022-06-19: 1500 mg via INTRAVENOUS
  Filled 2022-06-19 (×2): qty 300

## 2022-06-19 MED ORDER — CARVEDILOL 6.25 MG PO TABS
6.2500 mg | ORAL_TABLET | Freq: Two times a day (BID) | ORAL | Status: DC
  Administered 2022-06-20 – 2022-06-28 (×14): 6.25 mg via ORAL
  Filled 2022-06-19 (×17): qty 1

## 2022-06-19 MED ORDER — IOHEXOL 350 MG/ML SOLN
100.0000 mL | Freq: Once | INTRAVENOUS | Status: AC | PRN
  Administered 2022-06-19: 75 mL via INTRAVENOUS

## 2022-06-19 MED ORDER — SODIUM CHLORIDE 0.9 % IV SOLN
500.0000 mg | INTRAVENOUS | Status: AC
  Administered 2022-06-20 – 2022-06-23 (×3): 500 mg via INTRAVENOUS
  Filled 2022-06-19 (×4): qty 5

## 2022-06-19 MED ORDER — SODIUM CHLORIDE 0.9 % IV SOLN
12.5000 mg | Freq: Once | INTRAVENOUS | Status: DC
  Filled 2022-06-19: qty 0.5

## 2022-06-19 MED ORDER — LEVOTHYROXINE SODIUM 88 MCG PO TABS
88.0000 ug | ORAL_TABLET | Freq: Every day | ORAL | Status: DC
  Administered 2022-06-19 – 2022-06-27 (×9): 88 ug via ORAL
  Filled 2022-06-19 (×9): qty 1

## 2022-06-19 MED ORDER — SODIUM CHLORIDE 0.9 % IV BOLUS
500.0000 mL | Freq: Once | INTRAVENOUS | Status: AC
  Administered 2022-06-19: 500 mL via INTRAVENOUS

## 2022-06-19 MED ORDER — OXYCODONE HCL 5 MG PO TABS
5.0000 mg | ORAL_TABLET | ORAL | Status: DC | PRN
  Administered 2022-06-20 (×2): 5 mg via ORAL
  Administered 2022-06-26: 10 mg via ORAL
  Filled 2022-06-19 (×2): qty 1
  Filled 2022-06-19: qty 2
  Filled 2022-06-19: qty 1

## 2022-06-19 MED ORDER — ONDANSETRON HCL 4 MG PO TABS: 4.0000 mg | ORAL_TABLET | Freq: Four times a day (QID) | ORAL | Status: DC | PRN

## 2022-06-19 MED ORDER — GUAIFENESIN ER 600 MG PO TB12: 600.0000 mg | ORAL_TABLET | Freq: Two times a day (BID) | ORAL | Status: DC | PRN

## 2022-06-19 MED ORDER — SODIUM CHLORIDE 0.9 % IV SOLN
2.0000 g | Freq: Once | INTRAVENOUS | Status: AC
  Administered 2022-06-19: 2 g via INTRAVENOUS
  Filled 2022-06-19: qty 12.5

## 2022-06-19 MED ORDER — MORPHINE SULFATE (PF) 2 MG/ML IV SOLN: 2.0000 mg | INTRAVENOUS | Status: DC | PRN

## 2022-06-19 MED ORDER — ASPIRIN 81 MG PO CHEW
324.0000 mg | CHEWABLE_TABLET | Freq: Once | ORAL | Status: AC
  Administered 2022-06-19: 324 mg via ORAL
  Filled 2022-06-19: qty 4

## 2022-06-19 NOTE — Plan of Care (Signed)
  Problem: Clinical Measurements: Goal: Diagnostic test results will improve Outcome: Progressing Goal: Respiratory complications will improve Outcome: Progressing   Problem: Activity: Goal: Risk for activity intolerance will decrease Outcome: Progressing   Problem: Nutrition: Goal: Adequate nutrition will be maintained Outcome: Progressing   Problem: Coping: Goal: Level of anxiety will decrease Outcome: Progressing   Problem: Elimination: Goal: Will not experience complications related to bowel motility Outcome: Progressing Goal: Will not experience complications related to urinary retention Outcome: Progressing   Problem: Pain Managment: Goal: General experience of comfort will improve Outcome: Progressing   Problem: Safety: Goal: Ability to remain free from injury will improve Outcome: Progressing

## 2022-06-19 NOTE — Progress Notes (Signed)
ANTICOAGULATION CONSULT NOTE - Initial Consult  Pharmacy Consult for Heparin Indication: chest pain/ACS  Allergies  Allergen Reactions   Penicillins     Diffuse joint pain @ age 81 in context of Scarlet Fever  Did it involve swelling of the face/tongue/throat, SOB, or low BP? No Did it involve sudden or severe rash/hives, skin peeling, or any reaction on the inside of your mouth or nose? No Did you need to seek medical attention at a hospital or doctor's office? No When did it last happen?  childhood     If all above answers are "NO", may proceed with cephalosporin use.      Patient Measurements: Height: '5\' 4"'$  (162.6 cm) Weight: 84.8 kg (187 lb) IBW/kg (Calculated) : 54.7 Heparin Dosing Weight: 75 kg  Vital Signs: Temp: 97.6 F (36.4 C) (08/08 0426) Temp Source: Oral (08/08 0426) BP: 117/51 (08/08 0426) Pulse Rate: 81 (08/08 0426)  Labs: Recent Labs    06/19/22 0430 06/19/22 0442  HGB 9.7* 9.9*  HCT 31.1* 29.0*  PLT 413*  --   APTT 33  --   LABPROT 15.5*  --   INR 1.2  --   CREATININE 1.45* 1.30*  TROPONINIHS 2,421*  --     Estimated Creatinine Clearance: 36.3 mL/min (A) (by C-G formula based on SCr of 1.3 mg/dL (H)).   Medical History: Past Medical History:  Diagnosis Date   Arthritis    "fingers, all my joints" (09/03/2013)   Coronary artery disease    a. 08/2013: unstable angina s/p PTCA/DES to LAD, medical therapy for residual severe stenosis of a mid-distal left PDA branch of large dominant LCx, moderate RCA disease (consider PCI of L-PDA if she fails med rx).   DEPRESSION    Diverticulosis    Hyperlipidemia    Hypertension    HYPOTHYROIDISM    Preseptal cellulitis of right upper eyelid 03/10/2016   SVT (supraventricular tachycardia) ([place])    a. very brief transient SVT during 08/2013 admission overnight.   Type II diabetes mellitus ([place])    Dr Chalmers Cater    Medications:  No current facility-administered medications on file prior to encounter.    Current Outpatient Medications on File Prior to Encounter  Medication Sig Dispense Refill   amLODipine (NORVASC) 5 MG tablet Take 1 tablet (5 mg total) by mouth daily. Please keep upcoming appointment for future refills. Thank you. 30 tablet 1   carvedilol (COREG) 6.25 MG tablet TAKE 1 TABLET BY MOUTH TWICE DAILY WITH A MEAL 180 tablet 1   Cholecalciferol (VITAMIN D3 PO) Take 1 tablet by mouth daily.     clopidogrel (PLAVIX) 75 MG tablet Take 1 tablet by mouth once daily 90 tablet 0   enoxaparin (LOVENOX) 40 MG/0.4ML injection Inject 0.4 mLs (40 mg total) into the skin daily for 23 days. 9.2 mL 0   furosemide (LASIX) 40 MG tablet Take 1 tablet (40 mg total) by mouth daily as needed for fluid or edema. (Patient taking differently: Take 40 mg by mouth daily.) 90 tablet 3   isosorbide mononitrate (IMDUR) 30 MG 24 hr tablet Take 1 tablet by mouth once daily (Patient not taking: Reported on 06/02/2022) 90 tablet 1   JARDIANCE 25 MG TABS tablet Take 25 mg by mouth daily.     levothyroxine (SYNTHROID, LEVOTHROID) 88 MCG tablet Take 88 mcg by mouth at bedtime.      nitroGLYCERIN (NITROSTAT) 0.4 MG SL tablet Place 1 tablet (0.4 mg total) under the tongue every 5 (five) minutes as  needed for chest pain (3 doses max). 25 tablet 3   NOVOLIN 70/30 FLEXPEN (70-30) 100 UNIT/ML KwikPen Inject 30-50 Units into the skin in the morning and at bedtime. Take 30 units in the morning and Take 50 units at bedtime     oxyCODONE (OXY IR/ROXICODONE) 5 MG immediate release tablet Take 1-2 tablets (5-10 mg total) by mouth every 4 (four) hours as needed for severe pain or moderate pain. 30 tablet 0   Probiotic Product (PROBIOTIC PO) Take 1 tablet by mouth daily.     rosuvastatin (CRESTOR) 5 MG tablet Take 1 tablet (5 mg total) by mouth daily. (Patient not taking: Reported on 06/02/2022) 90 tablet 3     Assessment: 81 y.o. female with elevated troponin, possible ACS, for heparin  Goal of Therapy:  Heparin level 0.3-0.7  units/ml Monitor platelets by anticoagulation protocol: Yes   Plan:  Heparin 4000 units IV bolus, then start heparin 1000 units/hr Check heparin level in 8 hours.   Caryl Pina 06/19/2022,6:26 AM

## 2022-06-19 NOTE — ED Notes (Signed)
Pt c/o redness about vancomycin ATB. Pt rate decreased. Dr. Lorin Mercy aware.

## 2022-06-19 NOTE — ED Notes (Signed)
Pt heels elevated with a pillow. Pt turned to left side.

## 2022-06-19 NOTE — Progress Notes (Signed)
Received page for code sepsis. On arrival, pt not in room. Spoke with an ED RN who stated no assistance needed from VAS Team at this time.

## 2022-06-19 NOTE — Sepsis Progress Note (Signed)
Elink following for Sepsis Protocol 

## 2022-06-19 NOTE — ED Notes (Signed)
Pt c/o arm itching and red above vancomycin. Pt denies any other symptoms.  Dr. Lorin Mercy aware. Pt vancomycin rate decreased.

## 2022-06-19 NOTE — ED Provider Notes (Signed)
Care assumed for Dr. Ralene Bathe.  At time of transfer of care, patient awaiting for cardiology to evaluate and likely admit for possible NSTEMI with the chest pain with elevated troponin and EKG changes.  Patient also has possible infection in the urine and likely lungs with pneumonia.  Patient is COVID and flu negative.  Awaiting cardiology evaluation for admission.  8:16 AM Just spoke via message with cardiology.  They will see the patient in consultation but request admission to medicine given the concern for infectious process as well.  Will place a consult for medicine for admission   Ryenne Lynam, Gwenyth Allegra, MD 06/19/22 [phone]

## 2022-06-19 NOTE — ED Notes (Signed)
Pt clean up of urine at this time. Barrier cream and dressing applied to sacral area. Skin around wound blanchable.

## 2022-06-19 NOTE — Consult Note (Addendum)
Cardiology Consultation:   Patient ID: CHONTE RICKE MRN: [id number]; DOB: 1941/05/10  Admit date: 06/19/2022 Date of Consult: 06/19/2022  PCP:  [name], MD   [facility] Providers Cardiologist:  Jenkins Rouge, MD        Patient Profile:   Danielle Murray is a 81 y.o. female with a hx of LE fx and repair (d/c 07/25), DES LAD 2014 w/ med rx for RCA/PDA dz, DM2, HTN, HLD, SVT, depression, diverticulosis, who is being seen 06/19/2022 for the evaluation of NSTEMI at the request of Dr Sherry Ruffing.  History of Present Illness:   Danielle Murray went to [facility] after her surgery to go to rehab.  She has been slow to get stronger, was able to walk 90 steps with a walker the other day.  She has also had problems with bedsores, but she says she was told that was getting better.  When she walks, she gets pain in her right shoulder, but believes that is in her shoulder.  She has not had chest pain until today.  She has had dyspnea on exertion ever since she fell, but it has gotten worse in the last few days.  She had chills for the last 2 nights, which woke her up.  She was checked and a temperature of 102.  She had associated chest pain.  She was brought to the hospital.  She is on oxygen, but her respiratory rate is still increased and she has increased work of breathing.  She feels short of breath, but the chest pain has resolved.  She was started on antibiotics in the emergency room.  Urinalysis showed cloudy urine with greater than 500 glucose, protein and ketones, but no nitrites and only a small amount of leukocytes.  Lactic acid was 1.1.  Hemoglobin was 9.7 which is about where she has been since the surgery.  Respiratory panels were negative, blood cultures and urine culture are pending.  CT was negative for PE, but she had focal airspace disease right lower lobe with a somewhat nodular ill-defined appearance. Imaging features likely related to pneumonia, follow-up CT in 3 months  recommended to ensure resolution.  Currently, she is breathing somewhat better on oxygen, but respirations are still a bit labored even though O2 sats are 95%.  She is chest pain-free.   Past Medical History:  Diagnosis Date   Arthritis    "fingers, all my joints" (09/03/2013)   Coronary artery disease    a. 08/2013: unstable angina s/p PTCA/DES to LAD, medical therapy for residual severe stenosis of a mid-distal left PDA branch of large dominant LCx, moderate RCA disease (consider PCI of L-PDA if she fails med rx).   DEPRESSION    Diverticulosis    Hyperlipidemia    Hypertension    HYPOTHYROIDISM    Preseptal cellulitis of right upper eyelid 03/10/2016   SVT (supraventricular tachycardia) ([place])    a. very brief transient SVT during 08/2013 admission overnight.   Type II diabetes mellitus ([place])    Dr Chalmers Cater    Past Surgical History:  Procedure Laterality Date   ABDOMINAL HYSTERECTOMY  1989   no BSO; dysfunctional menses   CATARACT EXTRACTION W/ INTRAOCULAR LENS  IMPLANT, BILATERAL Bilateral 2014   CATARACT EXTRACTION W/PHACO Right 07/02/2013   CATARACT EXTRACTION W/PHACO Left 06/01/2013   COLONOSCOPY  2003   Tics   CORONARY ANGIOPLASTY WITH STENT PLACEMENT  09/03/2013   "1" (09/03/2013)   FEMUR IM NAIL Left 06/02/2022   Procedure: INTRAMEDULLARY (IM)  NAIL FEMORAL;  Surgeon: Willaim Sheng, MD;  Location: [facility];  Service: Orthopedics;  Laterality: Left;   LEFT HEART CATHETERIZATION WITH CORONARY ANGIOGRAM N/A 09/03/2013   Procedure: LEFT HEART CATHETERIZATION WITH CORONARY ANGIOGRAM;  Surgeon: Blane Ohara, MD;  Location: [facility] CATH LAB;  Service: Cardiovascular;  Laterality: N/A;   TOTAL KNEE ARTHROPLASTY Left 06/2005   Dr Gladstone Lighter     Home Medications:  Prior to Admission medications   Medication Sig Start Date End Date Taking? Authorizing Provider  amLODipine (NORVASC) 5 MG tablet Take 1 tablet (5 mg total) by mouth daily. Please keep upcoming appointment for future  refills. Thank you. 04/30/22   Josue Hector, MD  carvedilol (COREG) 6.25 MG tablet TAKE 1 TABLET BY MOUTH TWICE DAILY WITH A MEAL 11/14/21   Josue Hector, MD  Cholecalciferol (VITAMIN D3 PO) Take 1 tablet by mouth daily.    [provider]  clopidogrel (PLAVIX) 75 MG tablet Take 1 tablet by mouth once daily 05/04/22   Josue Hector, MD  enoxaparin (LOVENOX) 40 MG/0.4ML injection Inject 0.4 mLs (40 mg total) into the skin daily for 23 days. 06/06/22 06/29/22  Nita Sells, MD  furosemide (LASIX) 40 MG tablet Take 1 tablet (40 mg total) by mouth daily as needed for fluid or edema. Patient taking differently: Take 40 mg by mouth daily. 10/14/20   Josue Hector, MD  isosorbide mononitrate (IMDUR) 30 MG 24 hr tablet Take 1 tablet by mouth once daily Patient not taking: Reported on 06/02/2022 11/14/21   Josue Hector, MD  JARDIANCE 25 MG TABS tablet Take 25 mg by mouth daily. 05/11/22   [provider]  levothyroxine (SYNTHROID, LEVOTHROID) 88 MCG tablet Take 88 mcg by mouth at bedtime.     [provider]  nitroGLYCERIN (NITROSTAT) 0.4 MG SL tablet Place 1 tablet (0.4 mg total) under the tongue every 5 (five) minutes as needed for chest pain (3 doses max). 08/04/18   Josue Hector, MD  NOVOLIN 70/30 FLEXPEN (70-30) 100 UNIT/ML KwikPen Inject 30-50 Units into the skin in the morning and at bedtime. Take 30 units in the morning and Take 50 units at bedtime 04/08/21   [provider]  oxyCODONE (OXY IR/ROXICODONE) 5 MG immediate release tablet Take 1-2 tablets (5-10 mg total) by mouth every 4 (four) hours as needed for severe pain or moderate pain. 06/05/22   Nita Sells, MD  Probiotic Product (PROBIOTIC PO) Take 1 tablet by mouth daily.    [provider]  rosuvastatin (CRESTOR) 5 MG tablet Take 1 tablet (5 mg total) by mouth daily. Patient not taking: Reported on 06/02/2022 10/14/20   Josue Hector, MD    Inpatient Medications: Scheduled  Meds:  Continuous Infusions:  heparin 1,000 Units/hr (06/19/22 0644)   vancomycin     PRN Meds:   Allergies:    Allergies  Allergen Reactions   Penicillins     Diffuse joint pain @ age 41 in context of Scarlet Fever  Did it involve swelling of the face/tongue/throat, SOB, or low BP? No Did it involve sudden or severe rash/hives, skin peeling, or any reaction on the inside of your mouth or nose? No Did you need to seek medical attention at a hospital or doctor's office? No When did it last happen?  childhood     If all above answers are "NO", may proceed with cephalosporin use.      Social History:   Social History   Socioeconomic  History   Marital status: Married    Spouse name: Not on file   Number of children: Not on file   Years of education: Not on file   Highest education level: Not on file  Occupational History   Not on file  Tobacco Use   Smoking status: Never   Smokeless tobacco: Never  Vaping Use   Vaping Use: Never used  Substance and Sexual Activity   Alcohol use: Yes    Comment: 09/03/2013 "1-2 times/yr"   Drug use: No   Sexual activity: Yes  Other Topics Concern   Not on file  Social History Narrative   Not on file   Social Determinants of Health   Financial Resource Strain: Not on file  Food Insecurity: Not on file  Transportation Needs: Not on file  Physical Activity: Not on file  Stress: Not on file  Social Connections: Not on file  Intimate Partner Violence: Not on file    Family History:   Family History  Problem Relation Age of Onset   Asthma Mother    Hypertension Mother    Heart failure Mother    Hypertension Father    Stroke Father 66   Heart attack Father 48   Hypertension Sister    Hyperlipidemia Sister    Heart attack Sister        pre 103; Twin   Diabetes Sister        her TWIN   Hypertension Brother    Hyperlipidemia Brother    Coronary artery disease Brother        stents late 37s   Breast cancer Neg Hx       ROS:  Please see the history of present illness.  All other ROS reviewed and negative.     Physical Exam/Data:   Vitals:   06/19/22 0630 06/19/22 0645 06/19/22 0745 06/19/22 0830  BP: (!) 108/42 (!) 109/42 (!) 106/44 (!) 107/48  Pulse: 69 69 69 70  Resp: 20 (!) 25 (!) 23 (!) 26  Temp:      TempSrc:      SpO2: 95% 97% 96% 97%  Weight:      Height:        Intake/Output Summary (Last 24 hours) at 06/19/2022 0856 Last data filed at 06/19/2022 4481 Gross per 24 hour  Intake 600 ml  Output --  Net 600 ml      06/19/2022    4:26 AM 06/02/2022    9:29 AM 06/01/2022    8:44 PM  Last 3 Weights  Weight (lbs) 187 lb 185 lb 180 lb  Weight (kg) 84.823 kg 83.915 kg 81.647 kg     Body mass index is 32.1 kg/m.  General:  Well nourished, well developed, in acute distress HEENT: normal Neck:  JVD  Vascular: No carotid bruits; Distal pulses 2+ bilaterally Cardiac:  normal S1, S2; RRR; no murmur  Lungs: rales bases bilaterally, no wheezing, rhonchi Abd: soft, nontender, no hepatomegaly  Ext: no edema Musculoskeletal:  No deformities, BUE and BLE strength very weak but equal Skin: warm and dry  Neuro:  CNs 2-12 intact, no focal abnormalities noted Psych:  Normal affect   EKG:  The EKG was personally reviewed and demonstrates: Sinus rhythm, heart rate 71, decreased amplitude of complexes, diffuse T wave flattening, late transition, Q waves lead III, lateral T wave inversions,  changed from 10/14/2020 ECG Telemetry:  Telemetry was personally reviewed and demonstrates: Sinus rhythm  Relevant CV Studies:  ECHO: ordered  ECHO:  11/09/2020  1. Left ventricular ejection fraction, by estimation, is 55 to 60%. The  left ventricle has normal function. The left ventricle has no regional  wall motion abnormalities. Left ventricular diastolic parameters are  consistent with Grade II diastolic dysfunction (pseudonormalization).   2. Right ventricular systolic function is normal. The right  ventricular  size is normal.   3. The mitral valve is grossly normal. No evidence of mitral valve  regurgitation.   4. The aortic valve was not well visualized. Aortic valve regurgitation  is not visualized. No aortic stenosis is present.   5. The inferior vena cava is normal in size with greater than 50%  respiratory variability, suggesting right atrial pressure of 3 mmHg.   Comparison(s): A prior study was performed on 05/02/2017. No significant  change from prior study.   MYOVIEW: 05/02/2017 Nuclear stress EF: 68%. Blood pressure demonstrated a normal response to exercise. There is a small defect of moderate severity present in the apical lateral and apex location. The defect is reversible.This is consistent with ischemia. There was less than 110m of horizontal ST segment depression in the inferolateral leads during exercise This is an intermediate risk study. The left ventricular ejection fraction is hyperdynamic (>65%).  CARDIAC CATH:09/03/2013 Coronary angiography: Coronary dominance: right   Left mainstem: Arises from the left cusp. There is mild calcification. The left main divides into the LAD and left circumflex.   Left anterior descending (LAD): The LAD has severe proximal vessel disease. There is mild calcification. The proximal LAD has 80% stenosis. Just after the first diagonal there is 95% stenosis. The diagonal branch is large in caliber and has no significant disease. The mid and distal LAD have mild diffuse disease in the LAD terminates in the distal anterior wall.   Left circumflex (LCx): The left circumflex is a large, dominant vessel. The proximal vessel has 30-40% stenosis. The mid vessel is widely patent with mild nonobstructive disease. The first obtuse marginal branch is patent with diffuse 80% stenosis but this vessel is quite small in caliber. The circumflex gives off 2 left posterolateral branches and a left PDA branch. The PLA branches are patent and the PDA  branch has diffuse mid to distal 90% stenosis.   Right coronary artery (RCA): The RCA is small and nondominant. There is proximal 50-70% stenosis.   Left ventriculography: Left ventricular systolic function is normal, LVEF is estimated at 65%, there is no significant mitral regurgitation    PCI note: Following the diagnostic procedure, the decision was made to proceed with PCI of the proximal LAD where there was severe 95% stenosis. I initially tried to pass a 6 Fr guide but this would not pass through the forearm even with additional NTG and verapamil. A 5 Fr EBU guide was used. Heparin was used for anticoagulation. The patient had been preloaded with ASA and plavix. Once a therapeutic ACT was obtained, a cougar wire was advanced past the lesion. The lesion was predilated with a 2.5 x 15 mm balloon on 2 inflations. The lesion was then stented with a 2.5x24 mm Promus premier DES. The stent was then post-dilated with a 2.75 Parsons balloon to 18 atmospheres. There was an excellent result with 0% residual stenosis and TIMI-3 flow.   PCI Lesion Data: Lesion site: prox LAD Lesion % stenosis: 95 TIMI flow: 3 Stent: 2.5x24 mm Promus Premier DES Post-stenosis: 0% Post-TIMI flow: 3   Final Conclusions:   1. Severe diffuse stenosis of the proximal and mid LAD treated successfully  with a single drug-eluting stent. 2. Large, dominant left circumflex with severe stenosis of a mid-distal left PDA branch 3. Small, nondominant RCA with moderate stenosis 4. Normal left ventricular systolic function.  Laboratory Data:  High Sensitivity Troponin:   Recent Labs  Lab 06/19/22 0430 06/19/22 0641  TROPONINIHS 2,421* 2,139*     Chemistry Recent Labs  Lab 06/19/22 0430 06/19/22 0442  NA 134* 134*  K 3.7 3.7  CL 102 102  CO2 17*  --   GLUCOSE 180* 178*  BUN 36* 32*  CREATININE 1.45* 1.30*  CALCIUM 8.1*  --   GFRNONAA 36*  --   ANIONGAP 15  --     Recent Labs  Lab 06/19/22 0430  PROT 6.3*   ALBUMIN 2.5*  AST 22  ALT 12  ALKPHOS 111  BILITOT 1.7*   Lipids No results for input(s): "CHOL", "TRIG", "HDL", "LABVLDL", "LDLCALC", "CHOLHDL" in the last 168 hours.  Hematology Recent Labs  Lab 06/19/22 0430 06/19/22 0442  WBC 12.9*  --   RBC 3.42*  --   HGB 9.7* 9.9*  HCT 31.1* 29.0*  MCV 90.9  --   MCH 28.4  --   MCHC 31.2  --   RDW 14.5  --   PLT 413*  --    Thyroid No results for input(s): "TSH", "FREET4" in the last 168 hours.  BNPNo results for input(s): "BNP", "PROBNP" in the last 168 hours.  DDimer No results for input(s): "DDIMER" in the last 168 hours. Urinalysis    Component Value Date/Time   COLORURINE YELLOW 06/19/2022 0454   APPEARANCEUR CLOUDY (A) 06/19/2022 0454   LABSPEC 1.017 06/19/2022 0454   PHURINE 5.0 06/19/2022 0454   GLUCOSEU >=500 (A) 06/19/2022 0454   HGBUR NEGATIVE 06/19/2022 0454   BILIRUBINUR NEGATIVE 06/19/2022 0454   KETONESUR 5 (A) 06/19/2022 0454   PROTEINUR 30 (A) 06/19/2022 0454   NITRITE NEGATIVE 06/19/2022 0454   LEUKOCYTESUR SMALL (A) 06/19/2022 0454   No results found for: "IRON", "TIBC", "FERRITIN"   Radiology/Studies:  CT Angio Chest PE W/Cm &/Or Wo Cm  Result Date: 06/19/2022 CLINICAL DATA:  Fever with shortness of breath and wheezing. Recent hip surgery. EXAM: CT ANGIOGRAPHY CHEST WITH CONTRAST TECHNIQUE: Multidetector CT imaging of the chest was performed using the standard protocol during bolus administration of intravenous contrast. Multiplanar CT image reconstructions and MIPs were obtained to evaluate the vascular anatomy. RADIATION DOSE REDUCTION: This exam was performed according to the departmental dose-optimization program which includes automated exposure control, adjustment of the mA and/or kV according to patient size and/or use of iterative reconstruction technique. CONTRAST:  33m OMNIPAQUE IOHEXOL 350 MG/ML SOLN COMPARISON:  None Available. FINDINGS: Cardiovascular: Heart is enlarged. No substantial  pericardial effusion. Coronary artery calcification is evident. Mild atherosclerotic calcification is noted in the wall of the thoracic aorta. There is no filling defect within the opacified pulmonary arteries to suggest the presence of an acute pulmonary embolus. Mediastinum/Nodes: No mediastinal lymphadenopathy. There is no hilar lymphadenopathy. The esophagus has normal imaging features. Tiny hiatal hernia noted. There is no axillary lymphadenopathy. Lungs/Pleura: Focal airspace disease identified right lower lobe on image 75/6 with a somewhat nodular ill-defined appearance. Subsegmental atelectasis noted in the lower lungs bilaterally. No overtly suspicious pulmonary nodule or mass. Upper Abdomen: Unremarkable. Musculoskeletal: No worrisome lytic or sclerotic osseous abnormality. Review of the MIP images confirms the above findings. IMPRESSION: 1. No CT evidence for acute pulmonary embolus. 2. Focal airspace disease right lower lobe with a somewhat nodular  ill-defined appearance. Imaging features likely related to pneumonia although follow-up CT in 3 months recommended to ensure resolution. 3. Tiny hiatal hernia. 4. Aortic Atherosclerosis (ICD10-I70.0). Electronically Signed   By: Misty Stanley M.D.   On: 06/19/2022 06:29   DG Chest Port 1 View  Result Date: 06/19/2022 CLINICAL DATA:  Questionable sepsis.  Shortness of breath. EXAM: PORTABLE CHEST 1 VIEW COMPARISON:  05/31/2022 FINDINGS: 0438 hours. The cardio pericardial silhouette is enlarged. Interstitial markings are diffusely coarsened with chronic features. The lungs are clear without focal pneumonia, edema, pneumothorax or pleural effusion. The visualized bony structures of the thorax are unremarkable. Telemetry leads overlie the chest. IMPRESSION: Chronic interstitial coarsening. No acute cardiopulmonary findings. Electronically Signed   By: Misty Stanley M.D.   On: 06/19/2022 05:14     Assessment and Plan:   NSTEMI -This is in the setting of  possible sepsis from facility acquired pneumonia -Add heparin, follow H&H closely -Check echo, check BNP, she has some JVD and rales in her bases, but no fluid seen on chest x-ray or CT, this may all be due to her pneumonia. - She may be holding some fluid in her abdomen, but this is not clear, no Lasix for now -Once her echo was reviewed and her general medical condition has improved, decide on cath. -- echo pending, given concern for CHF, please be cautious w/ fluids -- pt on cath board for 08/10 at 1 pm w/ Dr Tamala Julian, orders written with normal pre-procedure fluids  2.  Facility acquired pneumonia +/- UTI - She has already been started on antibiotics in the emergency room. - She is not currently febrile. -Her oxygen levels have improved on oxygen -Management per IM  3.Anemia: -Her hemoglobin was normal prior to her hip replacement - Her hemoglobin has been stable but decreased since the surgery - Her MCV is normal, but she may have some iron deficiency, check profile -Management of this and other issues per IM    Risk Assessment/Risk Scores:     TIMI Risk Score for Unstable Angina or Non-ST Elevation MI:   The patient's TIMI risk score is 6, which indicates a 41% risk of all cause mortality, new or recurrent myocardial infarction or need for urgent revascularization in the next 14 days.   For questions or updates, please contact [place] Please consult [email or web address] for contact info under    Signed, Rosaria Ferries, PA-C  06/19/2022 8:56 AM  Agree with note by Rosaria Ferries PA-C  Danielle Murray was seen and evaluated by myself and Rosaria Ferries, PA-C.  She is a patient of Dr. Collier Salina Nishan's.  She has had LAD stenting by Dr. Burt Knack back in 2014 the left dominant system.  She did have left PDA disease as well.  She apparently had a hip fracture several weeks ago and spent the last 2 weeks at [facility] and rehab.  2 days ago she developed increasing shortness of breath.   Today in the ER chest CT was negative for pulmonary embolus but did show "pneumonia".  A urinalysis suggested UTI.  Blood work suggested sepsis.  She has been complaining of some chest pain.  Her troponins were elevated at 2000.  Her EKG shows new lateral T wave inversion.  She will be admitted to the medicine service and being treated for pneumonia/UTI/sepsis.  She is on IV heparin.  Plan for diagnostic coronary angiography in the next 24 to 40 hours.  Lorretta Harp, M.D., [place], [facility], [place], [place]  [facility] [address] [place], [place]  [id number]  [phone] 06/19/2022 10:19 AM

## 2022-06-19 NOTE — H&P (Signed)
History and Physical    Patient: Danielle Murray:[id number] DOB: Dec 03, 1940 DOA: 06/19/2022 DOS: the patient was seen and examined on 06/19/2022 PCP: [name], MD  Patient coming from: SNF rehab at [facility] - lives with husband normally; [place]: Husband, [phone]   Chief Complaint: Fever  HPI: Danielle Murray is a 81 y.o. female with medical history significant of CAD s/p stent; HTN; HLD; hypothyroidism; and DM presenting with fever. She was last admitted from 7/21-25 for hip fracture s/p repair.  She has been doing well with her rehab but became SOB a day or two ago.  She has had SOB periodically prior to last hospitalization, would have to sit down and rest with exertion.  Worse in the last 2 days significantly.  She has had mild pain in the back of her shoulder blades and pressure in her substernal region, "not real bad though."  She developed fever in the last 2 days, max 102.  No cough.  She was wheezing early this AM.  No urinary symptoms.      ER Course:  NSTEMI and sepsis.  CP/SOB, fever at home but none here.  Troponin 2400, started on Heparin with EKG changes.  STAT Echo, cards seeing.  PE study negative but with PNA.  ?UTI.  Started on abx.  Lactate ok.       Review of Systems: As mentioned in the history of present illness. All other systems reviewed and are negative. Past Medical History:  Diagnosis Date   Arthritis    "fingers, all my joints" (09/03/2013)   Coronary artery disease    a. 08/2013: unstable angina s/p PTCA/DES to LAD, medical therapy for residual severe stenosis of a mid-distal left PDA branch of large dominant LCx, moderate RCA disease (consider PCI of L-PDA if she fails med rx).   DEPRESSION    Diverticulosis    Hyperlipidemia    Hypertension    HYPOTHYROIDISM    Preseptal cellulitis of right upper eyelid 03/10/2016   SVT (supraventricular tachycardia) ([place])    a. very brief transient SVT during 08/2013 admission overnight.   Type II diabetes  mellitus ([place])    Dr Chalmers Cater   Past Surgical History:  Procedure Laterality Date   ABDOMINAL HYSTERECTOMY  1989   no BSO; dysfunctional menses   CATARACT EXTRACTION W/ INTRAOCULAR LENS  IMPLANT, BILATERAL Bilateral 2014   CATARACT EXTRACTION W/PHACO Right 07/02/2013   CATARACT EXTRACTION W/PHACO Left 06/01/2013   COLONOSCOPY  2003   Tics   CORONARY ANGIOPLASTY WITH STENT PLACEMENT  09/03/2013   "1" (09/03/2013)   FEMUR IM NAIL Left 06/02/2022   Procedure: INTRAMEDULLARY (IM) NAIL FEMORAL;  Surgeon: Willaim Sheng, MD;  Location: [facility];  Service: Orthopedics;  Laterality: Left;   LEFT HEART CATHETERIZATION WITH CORONARY ANGIOGRAM N/A 09/03/2013   Procedure: LEFT HEART CATHETERIZATION WITH CORONARY ANGIOGRAM;  Surgeon: Blane Ohara, MD;  Location: [facility] CATH LAB;  Service: Cardiovascular;  Laterality: N/A;   TOTAL KNEE ARTHROPLASTY Left 06/2005   Dr Gladstone Lighter   Social History:  reports that she has never smoked. She has never used smokeless tobacco. She reports current alcohol use. She reports that she does not use drugs.  Allergies  Allergen Reactions   Penicillins     Diffuse joint pain @ age 38 in context of Scarlet Fever  Did it involve swelling of the face/tongue/throat, SOB, or low BP? No Did it involve sudden or severe rash/hives, skin peeling, or any reaction on the inside of  your mouth or nose? No Did you need to seek medical attention at a hospital or doctor's office? No When did it last happen?  childhood     If all above answers are "NO", may proceed with cephalosporin use.      Family History  Problem Relation Age of Onset   Asthma Mother    Hypertension Mother    Heart failure Mother    Hypertension Father    Stroke Father 70   Heart attack Father 73   Hypertension Sister    Hyperlipidemia Sister    Heart attack Sister        pre 22; Twin   Diabetes Sister        her TWIN   Hypertension Brother    Hyperlipidemia Brother    Coronary artery disease  Brother        stents late 50s   Breast cancer Neg Hx     Prior to Admission medications   Medication Sig Start Date End Date Taking? Authorizing Provider  amLODipine (NORVASC) 5 MG tablet Take 1 tablet (5 mg total) by mouth daily. Please keep upcoming appointment for future refills. Thank you. 04/30/22   Josue Hector, MD  carvedilol (COREG) 6.25 MG tablet TAKE 1 TABLET BY MOUTH TWICE DAILY WITH A MEAL 11/14/21   Josue Hector, MD  Cholecalciferol (VITAMIN D3 PO) Take 1 tablet by mouth daily.    [provider]  clopidogrel (PLAVIX) 75 MG tablet Take 1 tablet by mouth once daily 05/04/22   Josue Hector, MD  enoxaparin (LOVENOX) 40 MG/0.4ML injection Inject 0.4 mLs (40 mg total) into the skin daily for 23 days. 06/06/22 06/29/22  Nita Sells, MD  furosemide (LASIX) 40 MG tablet Take 1 tablet (40 mg total) by mouth daily as needed for fluid or edema. Patient taking differently: Take 40 mg by mouth daily. 10/14/20   Josue Hector, MD  isosorbide mononitrate (IMDUR) 30 MG 24 hr tablet Take 1 tablet by mouth once daily Patient not taking: Reported on 06/02/2022 11/14/21   Josue Hector, MD  JARDIANCE 25 MG TABS tablet Take 25 mg by mouth daily. 05/11/22   [provider]  levothyroxine (SYNTHROID, LEVOTHROID) 88 MCG tablet Take 88 mcg by mouth at bedtime.     [provider]  nitroGLYCERIN (NITROSTAT) 0.4 MG SL tablet Place 1 tablet (0.4 mg total) under the tongue every 5 (five) minutes as needed for chest pain (3 doses max). 08/04/18   Josue Hector, MD  NOVOLIN 70/30 FLEXPEN (70-30) 100 UNIT/ML KwikPen Inject 30-50 Units into the skin in the morning and at bedtime. Take 30 units in the morning and Take 50 units at bedtime 04/08/21   [provider]  oxyCODONE (OXY IR/ROXICODONE) 5 MG immediate release tablet Take 1-2 tablets (5-10 mg total) by mouth every 4 (four) hours as needed for severe pain or moderate pain. 06/05/22   Nita Sells, MD   Probiotic Product (PROBIOTIC PO) Take 1 tablet by mouth daily.    [provider]  rosuvastatin (CRESTOR) 5 MG tablet Take 1 tablet (5 mg total) by mouth daily. Patient not taking: Reported on 06/02/2022 10/14/20   Josue Hector, MD    Physical Exam: Vitals:   06/19/22 0900 06/19/22 1030 06/19/22 1100 06/19/22 1115  BP: 111/70 (!) 104/46 (!) 102/50 (!) 105/49  Pulse: 68 64 62 62  Resp: '20 14 15 16  '$ Temp: 98.1 F (36.7 C)     TempSrc: Oral  SpO2: 97% 99% 98% 98%  Weight:      Height:       General:  Appears calm and comfortable and is in NAD, on [place] O2; appears fatigued Eyes:   EOMI, normal lids, iris ENT:  grossly normal hearing, lips & tongue, mmm Neck:  no LAD, masses or thyromegaly Cardiovascular:  RRR, no m/r/g. No LE edema.  Respiratory:   Scattered mild rhonchi in RLL.  Normal to mildly increased respiratory effort on [place] O2. Abdomen:  soft, NT, ND Skin:  no rash or induration seen on limited exam Musculoskeletal:  grossly normal tone BUE/BLE, good ROM, no bony abnormality Lower extremity:  No LE edema.  Limited foot exam with no ulcerations.  2+ distal pulses. Psychiatric:  blunted mood and affect, speech fluent and appropriate, AOx3 Neurologic:  CN 2-12 grossly intact, moves all extremities in coordinated fashion   Radiological Exams on Admission: Independently reviewed - see discussion in A/P where applicable  ECHOCARDIOGRAM COMPLETE  Result Date: 06/19/2022    ECHOCARDIOGRAM REPORT   Patient Name:   BRETA DEMEDEIROS Napolitano Date of Exam: 06/19/2022 Medical Rec #:  [id number]       Height:       64.0 in Accession #:    [id number]      Weight:       187.0 lb Date of Birth:  August 25, 1941        BSA:          1.901 m Patient Age:    69 years        BP:           106/44 mmHg Patient Gender: F               HR:           69 bpm. Exam Location:  Inpatient Procedure: 2D Echo, Cardiac Doppler, Color Doppler and Intracardiac            Opacification Agent Indications:    NSTEMI   History:        Patient has prior history of Echocardiogram examinations, most                 recent 11/09/2020. CAD, PAD, Arrythmias:Tachycardia; Risk                 Factors:Hypertension and Diabetes.  Sonographer:    Wenda Low Referring Phys: [id number] RHONDA G BARRETT IMPRESSIONS  1. No left ventricular thrombus is seen (Definity contrast was used). Wall motion suggests infarction due to occlusion of the mid-LAD artery or a large ramus intermedius vessel that reaches the lateral apex. Left ventricular ejection fraction, by estimation, is 35 to 40%. The left ventricle has moderately decreased function. The left ventricle demonstrates regional wall motion abnormalities (see scoring diagram/findings for description). There is mild concentric left ventricular hypertrophy. Left  ventricular diastolic parameters are consistent with Grade II diastolic dysfunction (pseudonormalization). Elevated left atrial pressure.  2. Right ventricular systolic function is normal. The right ventricular size is normal. There is normal pulmonary artery systolic pressure. The estimated right ventricular systolic pressure is 22.2 mmHg.  3. Left atrial size was severely dilated.  4. The mitral valve is degenerative. Mild to moderate mitral valve regurgitation. No evidence of mitral stenosis. The mean mitral valve gradient is 3.1 mmHg. Moderate mitral annular calcification.  5. Tricuspid valve regurgitation is mild to moderate.  6. The aortic valve is tricuspid. Aortic valve regurgitation is not visualized. No aortic stenosis is present. Comparison(s): Prior  images unable to be directly viewed, comparison made by report only. The left ventricular function is significantly worse. The left ventricular wall motion abnormalities are new. FINDINGS  Left Ventricle: No left ventricular thrombus is seen (Definity contrast was used). Wall motion suggests infarction due to occlusion of the mid-LAD artery or a large ramus intermedius vessel that  reaches the lateral apex. Left ventricular ejection fraction, by estimation, is 35 to 40%. The left ventricle has moderately decreased function. The left ventricle demonstrates regional wall motion abnormalities. Definity contrast agent was given IV to delineate the left ventricular endocardial borders. The left ventricular internal cavity size was normal in size. There is mild concentric left ventricular hypertrophy. Left ventricular diastolic parameters are consistent with Grade II diastolic dysfunction (pseudonormalization). Elevated left atrial pressure.  LV Wall Scoring: The apical lateral segment, mid anterolateral segment, apical anterior segment, and apex are akinetic. The anterior wall, entire septum, entire inferior wall, posterior wall, and basal anterolateral segment are normal. Right Ventricle: The right ventricular size is normal. No increase in right ventricular wall thickness. Right ventricular systolic function is normal. There is normal pulmonary artery systolic pressure. The tricuspid regurgitant velocity is 2.81 m/s, and  with an assumed right atrial pressure of 3 mmHg, the estimated right ventricular systolic pressure is 54.0 mmHg. Left Atrium: Left atrial size was severely dilated. Right Atrium: Right atrial size was normal in size. Pericardium: There is no evidence of pericardial effusion. Mitral Valve: The mitral valve is degenerative in appearance. Moderate mitral annular calcification. Mild to moderate mitral valve regurgitation, with centrally-directed jet. No evidence of mitral valve stenosis. MV peak gradient, 10.0 mmHg. The mean mitral valve gradient is 3.1 mmHg. Tricuspid Valve: The tricuspid valve is normal in structure. Tricuspid valve regurgitation is mild to moderate. Aortic Valve: The aortic valve is tricuspid. Aortic valve regurgitation is not visualized. No aortic stenosis is present. Aortic valve mean gradient measures 4.0 mmHg. Aortic valve peak gradient measures 9.0 mmHg.  Aortic valve area, by VTI measures 1.90 cm. Pulmonic Valve: The pulmonic valve was normal in structure. Pulmonic valve regurgitation is not visualized. Aorta: The aortic root and ascending aorta are structurally normal, with no evidence of dilitation. IAS/Shunts: No atrial level shunt detected by color flow Doppler.  LEFT VENTRICLE PLAX 2D LVIDd:         5.40 cm     Diastology LVIDs:         3.30 cm     LV e' medial:    5.33 cm/s LV PW:         1.20 cm     LV E/e' medial:  26.6 LV IVS:        0.85 cm     LV e' lateral:   8.16 cm/s LVOT diam:     1.80 cm     LV E/e' lateral: 17.4 LV SV:         68 LV SV Index:   36 LVOT Area:     2.54 cm  LV Volumes (MOD) LV vol d, MOD A2C: 64.4 ml LV vol d, MOD A4C: 74.6 ml LV vol s, MOD A2C: 31.6 ml LV vol s, MOD A4C: 36.4 ml LV SV MOD A2C:     32.8 ml LV SV MOD A4C:     74.6 ml LV SV MOD BP:      35.9 ml RIGHT VENTRICLE RV Basal diam:  2.80 cm RV Mid diam:    2.50 cm RV S prime:  10.60 cm/s TAPSE (M-mode): 3.0 cm LEFT ATRIUM             Index        RIGHT ATRIUM           Index LA diam:        5.00 cm 2.63 cm/m   RA Area:     14.20 cm LA Vol (A2C):   66.8 ml 35.13 ml/m  RA Volume:   34.90 ml  18.36 ml/m LA Vol (A4C):   67.3 ml 35.40 ml/m LA Biplane Vol: 67.8 ml 35.66 ml/m  AORTIC VALVE                    PULMONIC VALVE AV Area (Vmax):    1.67 cm     PV Vmax:       0.85 m/s AV Area (Vmean):   1.87 cm     PV Peak grad:  2.9 mmHg AV Area (VTI):     1.90 cm AV Vmax:           150.00 cm/s AV Vmean:          93.200 cm/s AV VTI:            0.357 m AV Peak Grad:      9.0 mmHg AV Mean Grad:      4.0 mmHg LVOT Vmax:         98.30 cm/s LVOT Vmean:        68.400 cm/s LVOT VTI:          0.266 m LVOT/AV VTI ratio: 0.75  AORTA Ao Root diam: 2.80 cm MITRAL VALVE                TRICUSPID VALVE MV Area (PHT): 3.06 cm     TR Peak grad:   31.6 mmHg MV Area VTI:   1.65 cm     TR Vmax:        281.00 cm/s MV Peak grad:  10.0 mmHg MV Mean grad:  3.1 mmHg     SHUNTS MV Vmax:       1.58 m/s      Systemic VTI:  0.27 m MV Vmean:      72.3 cm/s    Systemic Diam: 1.80 cm MV Decel Time: 248 msec MV E velocity: 142.00 cm/s MV A velocity: 74.20 cm/s MV E/A ratio:  1.91 Mihai Croitoru MD Electronically signed by Sanda Klein MD Signature Date/Time: 06/19/2022/11:24:42 AM    Final    CT Angio Chest PE W/Cm &/Or Wo Cm  Result Date: 06/19/2022 CLINICAL DATA:  Fever with shortness of breath and wheezing. Recent hip surgery. EXAM: CT ANGIOGRAPHY CHEST WITH CONTRAST TECHNIQUE: Multidetector CT imaging of the chest was performed using the standard protocol during bolus administration of intravenous contrast. Multiplanar CT image reconstructions and MIPs were obtained to evaluate the vascular anatomy. RADIATION DOSE REDUCTION: This exam was performed according to the departmental dose-optimization program which includes automated exposure control, adjustment of the mA and/or kV according to patient size and/or use of iterative reconstruction technique. CONTRAST:  21m OMNIPAQUE IOHEXOL 350 MG/ML SOLN COMPARISON:  None Available. FINDINGS: Cardiovascular: Heart is enlarged. No substantial pericardial effusion. Coronary artery calcification is evident. Mild atherosclerotic calcification is noted in the wall of the thoracic aorta. There is no filling defect within the opacified pulmonary arteries to suggest the presence of an acute pulmonary embolus. Mediastinum/Nodes: No mediastinal lymphadenopathy. There is no hilar lymphadenopathy. The esophagus has normal imaging features. Tiny  hiatal hernia noted. There is no axillary lymphadenopathy. Lungs/Pleura: Focal airspace disease identified right lower lobe on image 75/6 with a somewhat nodular ill-defined appearance. Subsegmental atelectasis noted in the lower lungs bilaterally. No overtly suspicious pulmonary nodule or mass. Upper Abdomen: Unremarkable. Musculoskeletal: No worrisome lytic or sclerotic osseous abnormality. Review of the MIP images confirms the above  findings. IMPRESSION: 1. No CT evidence for acute pulmonary embolus. 2. Focal airspace disease right lower lobe with a somewhat nodular ill-defined appearance. Imaging features likely related to pneumonia although follow-up CT in 3 months recommended to ensure resolution. 3. Tiny hiatal hernia. 4. Aortic Atherosclerosis (ICD10-I70.0). Electronically Signed   By: Misty Stanley M.D.   On: 06/19/2022 06:29   DG Chest Port 1 View  Result Date: 06/19/2022 CLINICAL DATA:  Questionable sepsis.  Shortness of breath. EXAM: PORTABLE CHEST 1 VIEW COMPARISON:  05/31/2022 FINDINGS: 0438 hours. The cardio pericardial silhouette is enlarged. Interstitial markings are diffusely coarsened with chronic features. The lungs are clear without focal pneumonia, edema, pneumothorax or pleural effusion. The visualized bony structures of the thorax are unremarkable. Telemetry leads overlie the chest. IMPRESSION: Chronic interstitial coarsening. No acute cardiopulmonary findings. Electronically Signed   By: Misty Stanley M.D.   On: 06/19/2022 05:14    EKG: Independently reviewed.   76 - NSR with rate 77; new TWI compared to prior in anterior leads 0620 - Afib with rate 71; NSCSLT other than afib   Labs on Admission: I have personally reviewed the available labs and imaging studies at the time of the admission.  Pertinent labs:    CO2 17 Glucose 180 BUN 36/Creatinine 1.45/GFR 36 Albumin 2.5 HS troponin 2421, 2139 Lactate 1.2, 1.1 WBC 12.9 Hgb 9.7 UA: >500 glucose, 5 ketones, small LE, 30 protein, many bacteria A1c 7.5   Assessment and Plan: Principal Problem:   NSTEMI (non-ST elevated myocardial infarction) ([place]) Active Problems:   Hypothyroidism   Essential hypertension   Uncontrolled type 2 diabetes mellitus with hyperglycemia, with long-term current use of insulin (HCC)   CAD (coronary artery disease), native coronary artery   Dyslipidemia   Status post surgery    NSTEMI -Patient with good story for  angina pre-hip fracture and h/o CAD -She is now SOB consistently -Now with possible PNA which may have precipitated NSTEMI -She has had mild scapular and substernal chest pain that appears to worsen with exertion. -CXR unremarkable.   -Initial HS troponin quite positive, suggestive of NSTEMI   -EKG with apparently new TWI, ?afib. -Will admit since the patient has positive troponins and/or an abnormal EKG with angina necessitating acute intervention. -Risk factor stratification with HgbA1c and FLP -Cardiology consultation requested -Patient appears likely to need invasive evaluation (cardiac catheterization) based on concerning history, pertinent symptoms, ischemic EKG, elevated troponin  -Resume Imdur -Continue carvedilol, Norvasc -Will plan to start Heparin drip  -morphine given -Supplemental O2 -Continue Crestor -Echo is pending; Takotsubo cardiomyopathy is also a consideration in this setting -Hold Plavix unless desired by cardiology  Pneumonia -Patient presenting with cough, fever to 102, mildly decreased oxygen saturation, and infiltrate in right lower lobe on chest x-ray -This appears to be most likely community-acquired pneumonia vs. HCAP.  -Other etiologies include aspiration (if altered mental status was more severe than described) versus URI (most likely viral). -Influenza negative. -COVID-19 negative. -Blood cultures are pending -Sepsis ruled out at this time and she also does not appear to have UTI (no symptoms) -Will start Azithromycin 500 mg IV daily and Rocephin; she was treated  with Cefepime and Vancomycin in the ER and this could be resumed if broadened coverage is needed. -Additional complicating factors include: hypoxia; apparent NSTEMI -Fever control -Repeat CBC in am -Will add albuterol PRN  HTN -Continue carvedilol, amlodipine -Will also add prn hydralazine  HLD -Continue Crestor -Check lipids  DM -A1c is 7.5, suboptimal control -Hold  Jardiance -Switch 70/30 to sem-glee for now -Will cover with moderate-scale SSI for now  Hypothyroidism -Continue Synthroid  Recent hip fracture -Appears to be doing well from this standpoint -She is likely to need to return to rehab following this hospitalization     Advance Care Planning:   Code Status: Full Code   Consults: Cardiology; RT  DVT Prophylaxis: Heparin drip  Family Communication: Husband was present throughout evaluation  Severity of Illness: The appropriate patient status for this patient is INPATIENT. Inpatient status is judged to be reasonable and necessary in order to provide the required intensity of service to ensure the patient's safety. The patient's presenting symptoms, physical exam findings, and initial radiographic and laboratory data in the context of their chronic comorbidities is felt to place them at high risk for further clinical deterioration. Furthermore, it is not anticipated that the patient will be medically stable for discharge from the hospital within 2 midnights of admission.   * I certify that at the point of admission it is my clinical judgment that the patient will require inpatient hospital care spanning beyond 2 midnights from the point of admission due to high intensity of service, high risk for further deterioration and high frequency of surveillance required.*  Author: Karmen Bongo, MD 06/19/2022 11:43 AM  For on call review [email or web address].

## 2022-06-19 NOTE — ED Triage Notes (Signed)
Pt arrived from [facility] via EMS. C/o fever 102. Was feeling generalized muscle aches at SNF and was given tyleonol. Pt began having SOB and wheezing. Pt Had left hip surgery on 06/07/22 but incision does not look infected.Pt denies pain here.

## 2022-06-19 NOTE — Progress Notes (Signed)
[place] for Heparin Indication: chest pain/ACS  Allergies  Allergen Reactions   Penicillins     Diffuse joint pain @ age 81 in context of Scarlet Fever  Did it involve swelling of the face/tongue/throat, SOB, or low BP? No Did it involve sudden or severe rash/hives, skin peeling, or any reaction on the inside of your mouth or nose? No Did you need to seek medical attention at a hospital or doctor's office? No When did it last happen?  childhood     If all above answers are "NO", may proceed with cephalosporin use.      Patient Measurements: Height: '5\' 4"'$  (162.6 cm) Weight: 84.8 kg (187 lb) IBW/kg (Calculated) : 54.7 Heparin Dosing Weight: 75 kg  Vital Signs: Temp: 97.8 F (36.6 C) (08/08 1530) Temp Source: Oral (08/08 1145) BP: 122/53 (08/08 1530) Pulse Rate: 65 (08/08 1530)  Labs: Recent Labs    06/19/22 0430 06/19/22 0442 06/19/22 0641 06/19/22 1512  HGB 9.7* 9.9*  --   --   HCT 31.1* 29.0*  --   --   PLT 413*  --   --   --   APTT 33  --   --   --   LABPROT 15.5*  --   --   --   INR 1.2  --   --   --   HEPARINUNFRC  --   --   --  0.36  CREATININE 1.45* 1.30*  --   --   TROPONINIHS 2,421*  --  2,139*  --     Estimated Creatinine Clearance: 36.3 mL/min (A) (by C-G formula based on SCr of 1.3 mg/dL (H)).   Medical History: Past Medical History:  Diagnosis Date   Arthritis    "fingers, all my joints" (09/03/2013)   Coronary artery disease    a. 08/2013: unstable angina s/p PTCA/DES to LAD, medical therapy for residual severe stenosis of a mid-distal left PDA branch of large dominant LCx, moderate RCA disease (consider PCI of L-PDA if she fails med rx).   DEPRESSION    Diverticulosis    Hyperlipidemia    Hypertension    HYPOTHYROIDISM    Preseptal cellulitis of right upper eyelid 03/10/2016   SVT (supraventricular tachycardia) ([place])    a. very brief transient SVT during 08/2013 admission overnight.   Type II diabetes  mellitus ([place])    Dr Chalmers Cater    Medications:  (Not in a hospital admission)  Scheduled:   [START ON 06/20/2022] amLODipine  5 mg Oral Daily   carvedilol  6.25 mg Oral BID WC   docusate sodium  100 mg Oral BID   insulin aspart  0-15 Units Subcutaneous TID WC   insulin aspart  0-5 Units Subcutaneous QHS   insulin glargine-yfgn  15 Units Subcutaneous BID   isosorbide mononitrate  30 mg Oral Daily   leptospermum manuka honey  1 Application Topical Daily   levothyroxine  88 mcg Oral QHS   rosuvastatin  5 mg Oral Daily   sodium chloride flush  3 mL Intravenous Q12H   Infusions:   [START ON 06/20/2022] azithromycin     [START ON 06/20/2022] cefTRIAXone (ROCEPHIN)  IV     heparin 1,000 Units/hr (06/19/22 0644)   PRN: acetaminophen **OR** acetaminophen, albuterol, bisacodyl, guaiFENesin, hydrALAZINE, morphine injection, ondansetron **OR** ondansetron (ZOFRAN) IV, oxyCODONE, polyethylene glycol  Assessment: 43 yof with a history of CAD s/p stent; HTN; HLD; hypothyroidism; and DM . Patient is presenting with NSTEMI and sepsis.  Heparin per pharmacy consult placed for chest pain/ACS.  Patient is not on anticoagulation prior to arrival. Patient started on 1000 units/hr IV heparin following 4000 unit IV bolus. Heparin level back at 0.36 this evening which is low therapeutic.  Hgb 9.9; plt 413 PT/INR 15.5/1.2 hsTrop 2139  Goal of Therapy:  Heparin level 0.3-0.7 units/ml Monitor platelets by anticoagulation protocol: Yes   Plan:  Slightly increase heparin infusion to 1050 units/hr Check anti-Xa level at 0100 and daily while on heparin Continue to monitor H&H and platelets  Lorelei Pont, PharmD, BCPS 06/19/2022 4:36 PM ED Clinical Pharmacist -  [phone]

## 2022-06-19 NOTE — Sepsis Progress Note (Signed)
Notified provider of need to order additional  fluid bolus. 

## 2022-06-19 NOTE — ED Notes (Signed)
Pt denies redness or itching. Pt vancomycin infusion complete.

## 2022-06-19 NOTE — Consult Note (Signed)
WOC Nurse Consult Note: Patient receiving care in [facility] ED 25. Reason for Consult:"decubitus" Wound type: scattered open wounds sacrum including slough.  See photo for wound details Pressure Injury POA: Yes Measurement: To be provided by the bedside RN in the flowsheet section  Wound bed: see photo Drainage (amount, consistency, odor)  Periwound: intact Dressing procedure/placement/frequency: Cleanse the wounds on the sacral area with NS. Pat dry then apply a nickel thick layer of MediHoney directly to the wound then cover with gauze and a foam dressing.   I have added the patient to the [place] f/u list for sacral area re-evaluation on Thursday to determine the efficacy of the [facility].  Monitor the wound area(s) for worsening of condition such as: Signs/symptoms of infection,  Increase in size,  Development of or worsening of odor, Development of pain, or increased pain at the affected locations.  Notify the medical team if any of these develop.  Thank you for the consult. Val Riles, RN, MSN, CWOCN, CNS-BC, pager [phone]

## 2022-06-19 NOTE — ED Provider Notes (Signed)
[facility] EMERGENCY DEPARTMENT Provider Note   CSN: [id number] Arrival date & time: 06/19/22  0421     History  Chief Complaint  Patient presents with   Fever    Danielle Murray is a 81 y.o. female.  The history is provided by the patient, the EMS personnel and medical records.  Fever Danielle Murray is a 81 y.o. female who presents to the Emergency Department complaining of fever.  She presents to the emergency department by EMS for evaluation of fever that woke her abruptly at 1:30 in the morning.  Reported fever at facility was 102.  She had associated chest discomfort and shortness of breath.  She was treated with acetaminophen 650 mg by staff at [facility].  EMS reports SPO2 86 to 87% on room air and she was treated with albuterol as well as pressure in the 80s and she was also treated with IV fluid bolus.  Patient reports overall her chest pressure and shortness of breath have improved and chest pressure is currently resolved.  She states that her hip is feeling well after surgery.  No falls.  She has a history of diabetes, hypertension.  She is status post repair of intertrochanteric hip fracture on July 22.  She is currently on Lovenox for DVT prophylaxis.     Home Medications Prior to Admission medications   Medication Sig Start Date End Date Taking? Authorizing Provider  amLODipine (NORVASC) 5 MG tablet Take 1 tablet (5 mg total) by mouth daily. Please keep upcoming appointment for future refills. Thank you. 04/30/22   Josue Hector, MD  carvedilol (COREG) 6.25 MG tablet TAKE 1 TABLET BY MOUTH TWICE DAILY WITH A MEAL 11/14/21   Josue Hector, MD  Cholecalciferol (VITAMIN D3 PO) Take 1 tablet by mouth daily.    [provider]  clopidogrel (PLAVIX) 75 MG tablet Take 1 tablet by mouth once daily 05/04/22   Josue Hector, MD  enoxaparin (LOVENOX) 40 MG/0.4ML injection Inject 0.4 mLs (40 mg total) into the skin daily for 23 days. 06/06/22 06/29/22   Nita Sells, MD  furosemide (LASIX) 40 MG tablet Take 1 tablet (40 mg total) by mouth daily as needed for fluid or edema. Patient taking differently: Take 40 mg by mouth daily. 10/14/20   Josue Hector, MD  isosorbide mononitrate (IMDUR) 30 MG 24 hr tablet Take 1 tablet by mouth once daily Patient not taking: Reported on 06/02/2022 11/14/21   Josue Hector, MD  JARDIANCE 25 MG TABS tablet Take 25 mg by mouth daily. 05/11/22   [provider]  levothyroxine (SYNTHROID, LEVOTHROID) 88 MCG tablet Take 88 mcg by mouth at bedtime.     [provider]  nitroGLYCERIN (NITROSTAT) 0.4 MG SL tablet Place 1 tablet (0.4 mg total) under the tongue every 5 (five) minutes as needed for chest pain (3 doses max). 08/04/18   Josue Hector, MD  NOVOLIN 70/30 FLEXPEN (70-30) 100 UNIT/ML KwikPen Inject 30-50 Units into the skin in the morning and at bedtime. Take 30 units in the morning and Take 50 units at bedtime 04/08/21   [provider]  oxyCODONE (OXY IR/ROXICODONE) 5 MG immediate release tablet Take 1-2 tablets (5-10 mg total) by mouth every 4 (four) hours as needed for severe pain or moderate pain. 06/05/22   Nita Sells, MD  Probiotic Product (PROBIOTIC PO) Take 1 tablet by mouth daily.    [provider]  rosuvastatin (CRESTOR) 5 MG tablet Take  1 tablet (5 mg total) by mouth daily. Patient not taking: Reported on 06/02/2022 10/14/20   Josue Hector, MD      Allergies    Penicillins    Review of Systems   Review of Systems  Constitutional:  Positive for fever.  All other systems reviewed and are negative.   Physical Exam Updated Vital Signs BP (!) 109/42   Pulse 69   Temp 97.6 F (36.4 C) (Oral)   Resp (!) 25   Ht '5\' 4"'$  (1.626 m)   Wt 84.8 kg   SpO2 97%   BMI 32.10 kg/m  Physical Exam Vitals and nursing note reviewed.  Constitutional:      Appearance: She is well-developed.  HENT:     Head: Normocephalic and atraumatic.   Cardiovascular:     Rate and Rhythm: Normal rate and regular rhythm.     Heart sounds: Murmur heard.  Pulmonary:     Effort: Pulmonary effort is normal. No respiratory distress.     Comments: Decreased air movement in the right lung base Abdominal:     Palpations: Abdomen is soft.     Tenderness: There is no abdominal tenderness. There is no guarding or rebound.  Genitourinary:    Comments: There is a healing sacral wound with minimal local erythema Musculoskeletal:        General: No tenderness.     Comments: 1+ edema to the left lower extremity.  There is healing ecchymosis to the lateral aspect of the left lower extremity.  No significant tenderness over the hip.  Postoperative sites to the left hip, thigh and leg are clean, dry, intact.  Skin:    General: Skin is warm and dry.  Neurological:     Mental Status: She is alert and oriented to person, place, and time.  Psychiatric:        Behavior: Behavior normal.      ED Results / Procedures / Treatments   Labs (all labs ordered are listed, but only abnormal results are displayed) Labs Reviewed  COMPREHENSIVE METABOLIC PANEL - Abnormal; Notable for the following components:      Result Value   Sodium 134 (*)    CO2 17 (*)    Glucose, Bld 180 (*)    BUN 36 (*)    Creatinine, Ser 1.45 (*)    Calcium 8.1 (*)    Total Protein 6.3 (*)    Albumin 2.5 (*)    Total Bilirubin 1.7 (*)    GFR, Estimated 36 (*)    All other components within normal limits  CBC WITH DIFFERENTIAL/PLATELET - Abnormal; Notable for the following components:   WBC 12.9 (*)    RBC 3.42 (*)    Hemoglobin 9.7 (*)    HCT 31.1 (*)    Platelets 413 (*)    Neutro Abs 12.0 (*)    Lymphs Abs 0.3 (*)    All other components within normal limits  PROTIME-INR - Abnormal; Notable for the following components:   Prothrombin Time 15.5 (*)    All other components within normal limits  URINALYSIS, ROUTINE W REFLEX MICROSCOPIC - Abnormal; Notable for the  following components:   APPearance CLOUDY (*)    Glucose, UA >=500 (*)    Ketones, ur 5 (*)    Protein, ur 30 (*)    Leukocytes,Ua SMALL (*)    WBC, UA >50 (*)    Bacteria, UA MANY (*)    All other components within normal limits  I-STAT CHEM  8, ED - Abnormal; Notable for the following components:   Sodium 134 (*)    BUN 32 (*)    Creatinine, Ser 1.30 (*)    Glucose, Bld 178 (*)    Calcium, Ion 1.01 (*)    TCO2 18 (*)    Hemoglobin 9.9 (*)    HCT 29.0 (*)    All other components within normal limits  TROPONIN I (HIGH SENSITIVITY) - Abnormal; Notable for the following components:   Troponin I (High Sensitivity) 2,421 (*)    All other components within normal limits  RESP PANEL BY RT-PCR (FLU A&B, COVID) ARPGX2  CULTURE, BLOOD (ROUTINE X 2)  CULTURE, BLOOD (ROUTINE X 2)  URINE CULTURE  LACTIC ACID, PLASMA  APTT  LACTIC ACID, PLASMA  HEPARIN LEVEL (UNFRACTIONATED)  TROPONIN I (HIGH SENSITIVITY)    EKG EKG Interpretation  Date/Time:  Tuesday June 19 2022 05:39:16 EDT Ventricular Rate:  77 PR Interval:  175 QRS Duration: 82 QT Interval:  399 QTC Calculation: 452 R Axis:   -22 Text Interpretation: Sinus rhythm Borderline left axis deviation Probable anterolateral infarct, age indeterm Abnormal T, consider ischemia, lateral leads TWI new compared to prior Confirmed by Quintella Reichert [phone]) on 06/19/2022 5:46:02 AM  Radiology CT Angio Chest PE W/Cm &/Or Wo Cm  Result Date: 06/19/2022 CLINICAL DATA:  Fever with shortness of breath and wheezing. Recent hip surgery. EXAM: CT ANGIOGRAPHY CHEST WITH CONTRAST TECHNIQUE: Multidetector CT imaging of the chest was performed using the standard protocol during bolus administration of intravenous contrast. Multiplanar CT image reconstructions and MIPs were obtained to evaluate the vascular anatomy. RADIATION DOSE REDUCTION: This exam was performed according to the departmental dose-optimization program which includes automated exposure  control, adjustment of the mA and/or kV according to patient size and/or use of iterative reconstruction technique. CONTRAST:  82m OMNIPAQUE IOHEXOL 350 MG/ML SOLN COMPARISON:  None Available. FINDINGS: Cardiovascular: Heart is enlarged. No substantial pericardial effusion. Coronary artery calcification is evident. Mild atherosclerotic calcification is noted in the wall of the thoracic aorta. There is no filling defect within the opacified pulmonary arteries to suggest the presence of an acute pulmonary embolus. Mediastinum/Nodes: No mediastinal lymphadenopathy. There is no hilar lymphadenopathy. The esophagus has normal imaging features. Tiny hiatal hernia noted. There is no axillary lymphadenopathy. Lungs/Pleura: Focal airspace disease identified right lower lobe on image 75/6 with a somewhat nodular ill-defined appearance. Subsegmental atelectasis noted in the lower lungs bilaterally. No overtly suspicious pulmonary nodule or mass. Upper Abdomen: Unremarkable. Musculoskeletal: No worrisome lytic or sclerotic osseous abnormality. Review of the MIP images confirms the above findings. IMPRESSION: 1. No CT evidence for acute pulmonary embolus. 2. Focal airspace disease right lower lobe with a somewhat nodular ill-defined appearance. Imaging features likely related to pneumonia although follow-up CT in 3 months recommended to ensure resolution. 3. Tiny hiatal hernia. 4. Aortic Atherosclerosis (ICD10-I70.0). Electronically Signed   By: EMisty StanleyM.D.   On: 06/19/2022 06:29   DG Chest Port 1 View  Result Date: 06/19/2022 CLINICAL DATA:  Questionable sepsis.  Shortness of breath. EXAM: PORTABLE CHEST 1 VIEW COMPARISON:  05/31/2022 FINDINGS: 0438 hours. The cardio pericardial silhouette is enlarged. Interstitial markings are diffusely coarsened with chronic features. The lungs are clear without focal pneumonia, edema, pneumothorax or pleural effusion. The visualized bony structures of the thorax are unremarkable.  Telemetry leads overlie the chest. IMPRESSION: Chronic interstitial coarsening. No acute cardiopulmonary findings. Electronically Signed   By: EMisty StanleyM.D.   On: 06/19/2022 05:14  Procedures Procedures    Medications Ordered in ED Medications  vancomycin (VANCOREADY) IVPB 1500 mg/300 mL (has no administration in time range)  heparin ADULT infusion 100 units/mL (25000 units/261m) (1,000 Units/hr Intravenous New Bag/Given 06/19/22 0644)  sodium chloride 0.9 % bolus 500 mL (0 mLs Intravenous Stopped 06/19/22 0629)  ceFEPIme (MAXIPIME) 2 g in sodium chloride 0.9 % 100 mL IVPB (0 g Intravenous Stopped 06/19/22 0629)  aspirin chewable tablet 324 mg (324 mg Oral Given 06/19/22 0644)  iohexol (OMNIPAQUE) 350 MG/ML injection 100 mL (75 mLs Intravenous Contrast Given 06/19/22 0621)  heparin bolus via infusion 4,000 Units (4,000 Units Intravenous Bolus from Bag 06/19/22 05427    ED Course/ Medical Decision Making/ A&P                           Medical Decision Making Amount and/or Complexity of Data Reviewed Labs: ordered. Radiology: ordered. ECG/medicine tests: ordered.  Risk OTC drugs. Prescription drug management.   Pt with hx/o HTN, HPL, DM, CAD here for evaluation of sob, chest pain, fever that started abruptly at 0130.  She is s/p recent IM nail following left intertrochanteric femur fracture on July 22.  She was started on empiric antibiotics for possible pneumonia given her shortness of breath, hypotension and fever.  UA is concerning for UTI.  EKG is abnormal and changed when compared to priors.  Her troponin is significantly elevated.  Given her recent postoperative state concern for PE and a CTA was obtained, which is negative for acute PE.  She was treated with aspirin, heparin for presumed ACS.  On repeat evaluation patient does have recurrent chest pain.  Given her soft pressures unable to administer nitrates.  Discussed with cardiology team on-call-will see the patient in consult.   Patient care transferred pending cardiology evaluation.        Final Clinical Impression(s) / ED Diagnoses Final diagnoses:  Sepsis with acute organ dysfunction without septic shock, due to unspecified organism, unspecified type ([facility]  NSTEMI (non-ST elevated myocardial infarction) ([facility]    Rx / DC Orders ED Discharge Orders     None         RQuintella Reichert MD 06/19/22 [phone]

## 2022-06-19 NOTE — ED Notes (Signed)
Turned pt at this time. PT requested diet sprite. Pt denies any other needs

## 2022-06-19 NOTE — ED Notes (Signed)
Dr. Lorin Mercy aware of pt BP.

## 2022-06-20 ENCOUNTER — Inpatient Hospital Stay (HOSPITAL_COMMUNITY): Payer: PPO

## 2022-06-20 DIAGNOSIS — D62 Acute posthemorrhagic anemia: Secondary | ICD-10-CM | POA: Diagnosis not present

## 2022-06-20 DIAGNOSIS — I10 Essential (primary) hypertension: Secondary | ICD-10-CM

## 2022-06-20 DIAGNOSIS — L8990 Pressure ulcer of unspecified site, unspecified stage: Secondary | ICD-10-CM | POA: Insufficient documentation

## 2022-06-20 DIAGNOSIS — E1165 Type 2 diabetes mellitus with hyperglycemia: Secondary | ICD-10-CM

## 2022-06-20 DIAGNOSIS — Z794 Long term (current) use of insulin: Secondary | ICD-10-CM

## 2022-06-20 DIAGNOSIS — I214 Non-ST elevation (NSTEMI) myocardial infarction: Secondary | ICD-10-CM | POA: Diagnosis not present

## 2022-06-20 LAB — BASIC METABOLIC PANEL
Anion gap: 10 (ref 5–15)
Anion gap: 11 (ref 5–15)
BUN: 36 mg/dL — ABNORMAL HIGH (ref 8–23)
BUN: 36 mg/dL — ABNORMAL HIGH (ref 8–23)
CO2: 17 mmol/L — ABNORMAL LOW (ref 22–32)
CO2: 18 mmol/L — ABNORMAL LOW (ref 22–32)
Calcium: 7.8 mg/dL — ABNORMAL LOW (ref 8.9–10.3)
Calcium: 8 mg/dL — ABNORMAL LOW (ref 8.9–10.3)
Chloride: 108 mmol/L (ref 98–111)
Chloride: 105 mmol/L (ref 98–111)
Creatinine, Ser: 1.29 mg/dL — ABNORMAL HIGH (ref 0.44–1.00)
Creatinine, Ser: 1.23 mg/dL — ABNORMAL HIGH (ref 0.44–1.00)
GFR, Estimated: 42 mL/min — ABNORMAL LOW (ref >60)
GFR, Estimated: 44 mL/min — ABNORMAL LOW (ref >60)
Glucose, Bld: 117 mg/dL — ABNORMAL HIGH (ref 70–99)
Glucose, Bld: 286 mg/dL — ABNORMAL HIGH (ref 70–99)
Potassium: 3.4 mmol/L — ABNORMAL LOW (ref 3.5–5.1)
Potassium: 4.3 mmol/L (ref 3.5–5.1)
Sodium: 135 mmol/L (ref 135–145)
Sodium: 134 mmol/L — ABNORMAL LOW (ref 135–145)

## 2022-06-20 LAB — BLOOD GAS, ARTERIAL
Acid-base deficit: 3.9 mmol/L — ABNORMAL HIGH (ref 0.0–2.0)
Bicarbonate: 19.5 mmol/L — ABNORMAL LOW (ref 20.0–28.0)
Drawn by: 406621
O2 Saturation: 97 %
Patient temperature: 36.9
pCO2 arterial: 30 mmHg — ABNORMAL LOW (ref 32–48)
pH, Arterial: 7.42 (ref 7.35–7.45)
pO2, Arterial: 74 mmHg — ABNORMAL LOW (ref 83–108)

## 2022-06-20 LAB — GLUCOSE, CAPILLARY
Glucose-Capillary: 136 mg/dL — ABNORMAL HIGH (ref 70–99)
Glucose-Capillary: 119 mg/dL — ABNORMAL HIGH (ref 70–99)
Glucose-Capillary: 116 mg/dL — ABNORMAL HIGH (ref 70–99)
Glucose-Capillary: 171 mg/dL — ABNORMAL HIGH (ref 70–99)
Glucose-Capillary: 336 mg/dL — ABNORMAL HIGH (ref 70–99)
Glucose-Capillary: 154 mg/dL — ABNORMAL HIGH (ref 70–99)

## 2022-06-20 LAB — CBC
HCT: 26.3 % — ABNORMAL LOW (ref 36.0–46.0)
Hemoglobin: 8.4 g/dL — ABNORMAL LOW (ref 12.0–15.0)
MCH: 28.8 pg (ref 26.0–34.0)
MCHC: 31.9 g/dL (ref 30.0–36.0)
MCV: 90.1 fL (ref 80.0–100.0)
Platelets: 371 10*3/uL (ref 150–400)
RBC: 2.92 MIL/uL — ABNORMAL LOW (ref 3.87–5.11)
RDW: 14.6 % (ref 11.5–15.5)
WBC: 13.7 10*3/uL — ABNORMAL HIGH (ref 4.0–10.5)
nRBC: 0 % (ref 0.0–0.2)

## 2022-06-20 LAB — FERRITIN: Ferritin: 169 ng/mL (ref 11–307)

## 2022-06-20 LAB — HEPARIN LEVEL (UNFRACTIONATED)
Heparin Unfractionated: 0.24 IU/mL — ABNORMAL LOW (ref 0.30–0.70)
Heparin Unfractionated: 0.28 IU/mL — ABNORMAL LOW (ref 0.30–0.70)
Heparin Unfractionated: 0.22 IU/mL — ABNORMAL LOW (ref 0.30–0.70)

## 2022-06-20 LAB — PREPARE RBC (CROSSMATCH)

## 2022-06-20 LAB — MRSA NEXT GEN BY PCR, NASAL: MRSA by PCR Next Gen: NOT DETECTED

## 2022-06-20 MED ORDER — SODIUM CHLORIDE 0.9 % IV SOLN
2.0000 g | Freq: Two times a day (BID) | INTRAVENOUS | Status: DC
  Administered 2022-06-20 – 2022-06-26 (×12): 2 g via INTRAVENOUS
  Filled 2022-06-20 (×12): qty 12.5

## 2022-06-20 MED ORDER — LINEZOLID 600 MG PO TABS
600.0000 mg | ORAL_TABLET | Freq: Two times a day (BID) | ORAL | Status: DC
  Filled 2022-06-20: qty 1

## 2022-06-20 MED ORDER — ALBUTEROL SULFATE (2.5 MG/3ML) 0.083% IN NEBU
2.5000 mg | INHALATION_SOLUTION | Freq: Four times a day (QID) | RESPIRATORY_TRACT | Status: DC
  Administered 2022-06-20 – 2022-06-23 (×10): 2.5 mg via RESPIRATORY_TRACT
  Filled 2022-06-20 (×11): qty 3

## 2022-06-20 MED ORDER — SODIUM CHLORIDE 0.9% FLUSH: 3.0000 mL | INTRAVENOUS | Status: DC | PRN

## 2022-06-20 MED ORDER — LINEZOLID 600 MG PO TABS
600.0000 mg | ORAL_TABLET | Freq: Two times a day (BID) | ORAL | Status: DC
  Filled 2022-06-20 (×2): qty 1

## 2022-06-20 MED ORDER — SODIUM CHLORIDE 0.9% FLUSH
3.0000 mL | Freq: Two times a day (BID) | INTRAVENOUS | Status: DC
  Administered 2022-06-20 – 2022-06-25 (×7): 3 mL via INTRAVENOUS

## 2022-06-20 MED ORDER — FUROSEMIDE 10 MG/ML IJ SOLN
40.0000 mg | Freq: Once | INTRAMUSCULAR | Status: AC
  Administered 2022-06-20: 40 mg via INTRAVENOUS
  Filled 2022-06-20: qty 4

## 2022-06-20 MED ORDER — ASPIRIN 81 MG PO TBEC
81.0000 mg | DELAYED_RELEASE_TABLET | Freq: Every day | ORAL | Status: DC
  Administered 2022-06-20 – 2022-06-24 (×4): 81 mg via ORAL
  Filled 2022-06-20 (×5): qty 1

## 2022-06-20 MED ORDER — SIMETHICONE 40 MG/0.6ML PO SUSP
40.0000 mg | Freq: Four times a day (QID) | ORAL | Status: DC | PRN
Start: 2022-06-20 — End: 2022-06-29
  Filled 2022-06-20: qty 0.6

## 2022-06-20 MED ORDER — SODIUM CHLORIDE 0.9% IV SOLUTION
Freq: Once | INTRAVENOUS | Status: AC
Start: 2022-06-20 — End: 2022-06-20

## 2022-06-20 MED ORDER — POTASSIUM CHLORIDE CRYS ER 20 MEQ PO TBCR
40.0000 meq | EXTENDED_RELEASE_TABLET | Freq: Once | ORAL | Status: AC
  Administered 2022-06-20: 40 meq via ORAL
  Filled 2022-06-20: qty 2

## 2022-06-20 MED ORDER — ASPIRIN 81 MG PO CHEW
81.0000 mg | CHEWABLE_TABLET | ORAL | Status: AC
  Administered 2022-06-21: 81 mg via ORAL
  Filled 2022-06-20: qty 1

## 2022-06-20 MED ORDER — SODIUM CHLORIDE 0.9 % IV SOLN: 250.0000 mL | INTRAVENOUS | Status: DC | PRN

## 2022-06-20 MED ORDER — SODIUM CHLORIDE 0.9 % IV SOLN
INTRAVENOUS | Status: DC
Start: 2022-06-21 — End: 2022-06-21

## 2022-06-20 NOTE — Progress Notes (Signed)
Progress Note  Patient Name: Danielle Murray Date of Encounter: 06/20/2022  [facility] Cardiologist: Jenkins Rouge, MD   Subjective   Shortness of breath improved.  No chest pain.  Inpatient Medications    Scheduled Meds:  sodium chloride   Intravenous Once   amLODipine  5 mg Oral Daily   carvedilol  6.25 mg Oral BID WC   docusate sodium  100 mg Oral BID   insulin aspart  0-15 Units Subcutaneous TID WC   insulin aspart  0-5 Units Subcutaneous QHS   insulin glargine-yfgn  15 Units Subcutaneous BID   isosorbide mononitrate  30 mg Oral Daily   leptospermum manuka honey  1 Application Topical Daily   levothyroxine  88 mcg Oral QHS   rosuvastatin  5 mg Oral Daily   sodium chloride flush  3 mL Intravenous Q12H   Continuous Infusions:  azithromycin     cefTRIAXone (ROCEPHIN)  IV     heparin 1,150 Units/hr (06/20/22 0215)   promethazine (PHENERGAN) injection (IM or IVPB)     PRN Meds: acetaminophen **OR** acetaminophen, albuterol, bisacodyl, guaiFENesin, hydrALAZINE, morphine injection, ondansetron **OR** ondansetron (ZOFRAN) IV, oxyCODONE, polyethylene glycol   Vital Signs    Vitals:   06/19/22 2348 06/20/22 0337 06/20/22 0746 06/20/22 0900  BP: (!) 141/53 (!) 129/47 (!) 129/54 (!) 133/42  Pulse: 67 69 75   Resp: 20 (!) 21 (!) 22 (!) 23  Temp: 98.7 F (37.1 C) 97.7 F (36.5 C) 97.8 F (36.6 C)   TempSrc: Oral Oral Oral Oral  SpO2: 95% 96% 94%   Weight:      Height:        Intake/Output Summary (Last 24 hours) at 06/20/2022 0914 Last data filed at 06/20/2022 1610 Gross per 24 hour  Intake 1397.54 ml  Output 550 ml  Net 847.54 ml      06/19/2022    4:26 AM 06/02/2022    9:29 AM 06/01/2022    8:44 PM  Last 3 Weights  Weight (lbs) 187 lb 185 lb 180 lb  Weight (kg) 84.823 kg 83.915 kg 81.647 kg      Telemetry    Normal sinus rhythm- Personally Reviewed  ECG    Not performed today- Personally Reviewed  Physical Exam   GEN: No acute distress.   Neck: No  JVD Cardiac: RRR, no murmurs, rubs, or gallops.  Respiratory: Clear to auscultation bilaterally. GI: Soft, nontender, non-distended  MS: No edema; No deformity. Neuro:  Nonfocal  Psych: Normal affect   Labs    High Sensitivity Troponin:   Recent Labs  Lab 06/19/22 0430 06/19/22 0641  TROPONINIHS 2,421* 2,139*     Chemistry Recent Labs  Lab 06/19/22 0430 06/19/22 0442 06/20/22 0056  NA 134* 134* 135  K 3.7 3.7 3.4*  CL 102 102 108  CO2 17*  --  17*  GLUCOSE 180* 178* 117*  BUN 36* 32* 36*  CREATININE 1.45* 1.30* 1.29*  CALCIUM 8.1*  --  7.8*  PROT 6.3*  --   --   ALBUMIN 2.5*  --   --   AST 22  --   --   ALT 12  --   --   ALKPHOS 111  --   --   BILITOT 1.7*  --   --   GFRNONAA 36*  --  42*  ANIONGAP 15  --  10    Lipids No results for input(s): "CHOL", "TRIG", "HDL", "LABVLDL", "LDLCALC", "CHOLHDL" in the last 168 hours.  Hematology Recent Labs  Lab 06/19/22 0430 06/19/22 0442 06/20/22 0056  WBC 12.9*  --  13.7*  RBC 3.42*  --  2.92*  HGB 9.7* 9.9* 8.4*  HCT 31.1* 29.0* 26.3*  MCV 90.9  --  90.1  MCH 28.4  --  28.8  MCHC 31.2  --  31.9  RDW 14.5  --  14.6  PLT 413*  --  371   Thyroid No results for input(s): "TSH", "FREET4" in the last 168 hours.  BNP Recent Labs  Lab 06/19/22 0921  BNP 1,579.3*    DDimer No results for input(s): "DDIMER" in the last 168 hours.   Radiology    ECHOCARDIOGRAM COMPLETE  Result Date: 06/19/2022    ECHOCARDIOGRAM REPORT   Patient Name:   Danielle Murray Date of Exam: 06/19/2022 Medical Rec #:  [id number]       Height:       64.0 in Accession #:    [id number]      Weight:       187.0 lb Date of Birth:  February 11, 1941        BSA:          1.901 m Patient Age:    81 years        BP:           106/44 mmHg Patient Gender: F               HR:           69 bpm. Exam Location:  Inpatient Procedure: 2D Echo, Cardiac Doppler, Color Doppler and Intracardiac            Opacification Agent Indications:    NSTEMI  History:        Patient has  prior history of Echocardiogram examinations, most                 recent 11/09/2020. CAD, PAD, Arrythmias:Tachycardia; Risk                 Factors:Hypertension and Diabetes.  Sonographer:    Wenda Low Referring Phys: [id number] RHONDA G BARRETT IMPRESSIONS  1. No left ventricular thrombus is seen (Definity contrast was used). Wall motion suggests infarction due to occlusion of the mid-LAD artery or a large ramus intermedius vessel that reaches the lateral apex. Left ventricular ejection fraction, by estimation, is 35 to 40%. The left ventricle has moderately decreased function. The left ventricle demonstrates regional wall motion abnormalities (see scoring diagram/findings for description). There is mild concentric left ventricular hypertrophy. Left  ventricular diastolic parameters are consistent with Grade II diastolic dysfunction (pseudonormalization). Elevated left atrial pressure.  2. Right ventricular systolic function is normal. The right ventricular size is normal. There is normal pulmonary artery systolic pressure. The estimated right ventricular systolic pressure is 44.0 mmHg.  3. Left atrial size was severely dilated.  4. The mitral valve is degenerative. Mild to moderate mitral valve regurgitation. No evidence of mitral stenosis. The mean mitral valve gradient is 3.1 mmHg. Moderate mitral annular calcification.  5. Tricuspid valve regurgitation is mild to moderate.  6. The aortic valve is tricuspid. Aortic valve regurgitation is not visualized. No aortic stenosis is present. Comparison(s): Prior images unable to be directly viewed, comparison made by report only. The left ventricular function is significantly worse. The left ventricular wall motion abnormalities are new. FINDINGS  Left Ventricle: No left ventricular thrombus is seen (Definity contrast was used). Wall motion suggests infarction due to occlusion of the mid-LAD artery  or a large ramus intermedius vessel that reaches the lateral apex.  Left ventricular ejection fraction, by estimation, is 35 to 40%. The left ventricle has moderately decreased function. The left ventricle demonstrates regional wall motion abnormalities. Definity contrast agent was given IV to delineate the left ventricular endocardial borders. The left ventricular internal cavity size was normal in size. There is mild concentric left ventricular hypertrophy. Left ventricular diastolic parameters are consistent with Grade II diastolic dysfunction (pseudonormalization). Elevated left atrial pressure.  LV Wall Scoring: The apical lateral segment, mid anterolateral segment, apical anterior segment, and apex are akinetic. The anterior wall, entire septum, entire inferior wall, posterior wall, and basal anterolateral segment are normal. Right Ventricle: The right ventricular size is normal. No increase in right ventricular wall thickness. Right ventricular systolic function is normal. There is normal pulmonary artery systolic pressure. The tricuspid regurgitant velocity is 2.81 m/s, and  with an assumed right atrial pressure of 3 mmHg, the estimated right ventricular systolic pressure is 27.7 mmHg. Left Atrium: Left atrial size was severely dilated. Right Atrium: Right atrial size was normal in size. Pericardium: There is no evidence of pericardial effusion. Mitral Valve: The mitral valve is degenerative in appearance. Moderate mitral annular calcification. Mild to moderate mitral valve regurgitation, with centrally-directed jet. No evidence of mitral valve stenosis. MV peak gradient, 10.0 mmHg. The mean mitral valve gradient is 3.1 mmHg. Tricuspid Valve: The tricuspid valve is normal in structure. Tricuspid valve regurgitation is mild to moderate. Aortic Valve: The aortic valve is tricuspid. Aortic valve regurgitation is not visualized. No aortic stenosis is present. Aortic valve mean gradient measures 4.0 mmHg. Aortic valve peak gradient measures 9.0 mmHg. Aortic valve area, by VTI  measures 1.90 cm. Pulmonic Valve: The pulmonic valve was normal in structure. Pulmonic valve regurgitation is not visualized. Aorta: The aortic root and ascending aorta are structurally normal, with no evidence of dilitation. IAS/Shunts: No atrial level shunt detected by color flow Doppler.  LEFT VENTRICLE PLAX 2D LVIDd:         5.40 cm     Diastology LVIDs:         3.30 cm     LV e' medial:    5.33 cm/s LV PW:         1.20 cm     LV E/e' medial:  26.6 LV IVS:        0.85 cm     LV e' lateral:   8.16 cm/s LVOT diam:     1.80 cm     LV E/e' lateral: 17.4 LV SV:         68 LV SV Index:   36 LVOT Area:     2.54 cm  LV Volumes (MOD) LV vol d, MOD A2C: 64.4 ml LV vol d, MOD A4C: 74.6 ml LV vol s, MOD A2C: 31.6 ml LV vol s, MOD A4C: 36.4 ml LV SV MOD A2C:     32.8 ml LV SV MOD A4C:     74.6 ml LV SV MOD BP:      35.9 ml RIGHT VENTRICLE RV Basal diam:  2.80 cm RV Mid diam:    2.50 cm RV S prime:     10.60 cm/s TAPSE (M-mode): 3.0 cm LEFT ATRIUM             Index        RIGHT ATRIUM           Index LA diam:        5.00  cm 2.63 cm/m   RA Area:     14.20 cm LA Vol (A2C):   66.8 ml 35.13 ml/m  RA Volume:   34.90 ml  18.36 ml/m LA Vol (A4C):   67.3 ml 35.40 ml/m LA Biplane Vol: 67.8 ml 35.66 ml/m  AORTIC VALVE                    PULMONIC VALVE AV Area (Vmax):    1.67 cm     PV Vmax:       0.85 m/s AV Area (Vmean):   1.87 cm     PV Peak grad:  2.9 mmHg AV Area (VTI):     1.90 cm AV Vmax:           150.00 cm/s AV Vmean:          93.200 cm/s AV VTI:            0.357 m AV Peak Grad:      9.0 mmHg AV Mean Grad:      4.0 mmHg LVOT Vmax:         98.30 cm/s LVOT Vmean:        68.400 cm/s LVOT VTI:          0.266 m LVOT/AV VTI ratio: 0.75  AORTA Ao Root diam: 2.80 cm MITRAL VALVE                TRICUSPID VALVE MV Area (PHT): 3.06 cm     TR Peak grad:   31.6 mmHg MV Area VTI:   1.65 cm     TR Vmax:        281.00 cm/s MV Peak grad:  10.0 mmHg MV Mean grad:  3.1 mmHg     SHUNTS MV Vmax:       1.58 m/s     Systemic VTI:  0.27 m  MV Vmean:      72.3 cm/s    Systemic Diam: 1.80 cm MV Decel Time: 248 msec MV E velocity: 142.00 cm/s MV A velocity: 74.20 cm/s MV E/A ratio:  1.91 Mihai Croitoru MD Electronically signed by Sanda Klein MD Signature Date/Time: 06/19/2022/11:24:42 AM    Final    CT Angio Chest PE W/Cm &/Or Wo Cm  Result Date: 06/19/2022 CLINICAL DATA:  Fever with shortness of breath and wheezing. Recent hip surgery. EXAM: CT ANGIOGRAPHY CHEST WITH CONTRAST TECHNIQUE: Multidetector CT imaging of the chest was performed using the standard protocol during bolus administration of intravenous contrast. Multiplanar CT image reconstructions and MIPs were obtained to evaluate the vascular anatomy. RADIATION DOSE REDUCTION: This exam was performed according to the departmental dose-optimization program which includes automated exposure control, adjustment of the mA and/or kV according to patient size and/or use of iterative reconstruction technique. CONTRAST:  36m OMNIPAQUE IOHEXOL 350 MG/ML SOLN COMPARISON:  None Available. FINDINGS: Cardiovascular: Heart is enlarged. No substantial pericardial effusion. Coronary artery calcification is evident. Mild atherosclerotic calcification is noted in the wall of the thoracic aorta. There is no filling defect within the opacified pulmonary arteries to suggest the presence of an acute pulmonary embolus. Mediastinum/Nodes: No mediastinal lymphadenopathy. There is no hilar lymphadenopathy. The esophagus has normal imaging features. Tiny hiatal hernia noted. There is no axillary lymphadenopathy. Lungs/Pleura: Focal airspace disease identified right lower lobe on image 75/6 with a somewhat nodular ill-defined appearance. Subsegmental atelectasis noted in the lower lungs bilaterally. No overtly suspicious pulmonary nodule or mass. Upper Abdomen: Unremarkable. Musculoskeletal: No worrisome lytic or sclerotic osseous abnormality.  Review of the MIP images confirms the above findings. IMPRESSION: 1. No CT  evidence for acute pulmonary embolus. 2. Focal airspace disease right lower lobe with a somewhat nodular ill-defined appearance. Imaging features likely related to pneumonia although follow-up CT in 3 months recommended to ensure resolution. 3. Tiny hiatal hernia. 4. Aortic Atherosclerosis (ICD10-I70.0). Electronically Signed   By: Misty Stanley M.D.   On: 06/19/2022 06:29   DG Chest Port 1 View  Result Date: 06/19/2022 CLINICAL DATA:  Questionable sepsis.  Shortness of breath. EXAM: PORTABLE CHEST 1 VIEW COMPARISON:  05/31/2022 FINDINGS: 0438 hours. The cardio pericardial silhouette is enlarged. Interstitial markings are diffusely coarsened with chronic features. The lungs are clear without focal pneumonia, edema, pneumothorax or pleural effusion. The visualized bony structures of the thorax are unremarkable. Telemetry leads overlie the chest. IMPRESSION: Chronic interstitial coarsening. No acute cardiopulmonary findings. Electronically Signed   By: Misty Stanley M.D.   On: 06/19/2022 05:14    Cardiac Studies   2D echocardiogram (06/19/2022)  IMPRESSIONS     1. No left ventricular thrombus is seen (Definity contrast was used).  Wall motion suggests infarction due to occlusion of the mid-LAD artery or  a large ramus intermedius vessel that reaches the lateral apex. Left  ventricular ejection fraction, by  estimation, is 35 to 40%. The left ventricle has moderately decreased  function. The left ventricle demonstrates regional wall motion  abnormalities (see scoring diagram/findings for description). There is  mild concentric left ventricular hypertrophy. Left   ventricular diastolic parameters are consistent with Grade II diastolic  dysfunction (pseudonormalization). Elevated left atrial pressure.   2. Right ventricular systolic function is normal. The right ventricular  size is normal. There is normal pulmonary artery systolic pressure. The  estimated right ventricular systolic pressure is 40.0  mmHg.   3. Left atrial size was severely dilated.   4. The mitral valve is degenerative. Mild to moderate mitral valve  regurgitation. No evidence of mitral stenosis. The mean mitral valve  gradient is 3.1 mmHg. Moderate mitral annular calcification.   5. Tricuspid valve regurgitation is mild to moderate.   6. The aortic valve is tricuspid. Aortic valve regurgitation is not  visualized. No aortic stenosis is present.  Patient Profile   Danielle Murray is a 81 y.o. female with a hx of LE fx and repair (d/c 07/25), DES LAD 2014 w/ med rx for RCA/PDA dz, DM2, HTN, HLD, SVT, depression, diverticulosis, who is being seen 06/19/2022 for the evaluation of NSTEMI at the request of Dr Sherry Ruffing.   Assessment & Plan    1: Non-STEMI-troponins rose to 2400.  EKG yesterday showed anterolateral T wave inversion.  2D echo shows decline in EF of 35 to 40% with wall motion abnormality suggesting LAD disease.  This is in the distribution of the prior stent.  She is on IV heparin.  She has had no chest pain.  Will plan diagnostic coronary angiography tomorrow.  2: Hospital-acquired pneumonia-seen on chest CTA.  On antibiotics per primary care team.  3: Anemia-hemoglobin 8.4 down from 9.7.  Patient's scheduled to get transfused a unit of packed red blood cells  Given patient's EKG changes and 2D echo findings she will need diagnostic coronary angiography.  Will set up electively for tomorrow.  According to the cath note performed Dr. Burt Knack 09/03/2013, he was unable to get up right radial despite vasodilators.  Patient should probably be cath permanently.       For questions or updates, please contact [facility] Please consult [email or web address] for contact info under        Signed, Quay Burow, MD  06/20/2022, 9:14 AM

## 2022-06-20 NOTE — Consult Note (Signed)
[place] Nurse Consult Note: Patient receiving care in [facility] 2C04 Reason for Consult: Sacral ulcer Patient seen and assessed yesterday by my colleague Lenore Manner. See note. Orders placed for MediHoney daily. Patient is on [facility] FU list to reassess Thursday 06/21/22.  Cathlean Marseilles Tamala Julian, MSN, RN, [place], Lysle Pearl, [facility] Wound Treatment Associate Pager [phone]

## 2022-06-20 NOTE — Progress Notes (Addendum)
PROGRESS NOTE    [name]  HAL:[id number] DOB: 1941/05/20 DOA: 06/19/2022 PCP: [name], MD    Chief Complaint  Patient presents with   Fever    Brief Narrative:   HANEEFAH Murray is a 81 y.o. female with medical history significant of CAD s/p stent; HTN; HLD; hypothyroidism; and DM presenting with fever. She was last admitted from 7/21-25 for hip fracture s/p repair.  She has been doing well with her rehab but became SOB a day or two ago.  She has had SOB periodically prior to last hospitalization, would have to sit down and rest with exertion.  Worse in the last 2 days significantly.  She has had mild pain in the back of her shoulder blades and pressure in her substernal region, "not real bad though."  She developed fever in the last 2 days, max 102.  No cough.  She was wheezing early this AM.  No urinary symptoms.   - her work up significant NSTEMI, and pneumonia, she is admitted for further workup.  Assessment & Plan:   Principal Problem:   NSTEMI (non-ST elevated myocardial infarction) ([facility]) Active Problems:   Hypothyroidism   Essential hypertension   Uncontrolled type 2 diabetes mellitus with hyperglycemia, with long-term current use of insulin (HCC)   CAD (coronary artery disease), native coronary artery   Dyslipidemia   Status post surgery   Pressure injury of skin  Pneumonia -Patient presenting with cough, fever to 102, mildly decreased oxygen saturation, and infiltrate in right lower lobe on chest x-ray -This appears to be most likely community-acquired pneumonia  -Influenza negative. -COVID-19 negative. -Blood cultures are pending -Sepsis ruled out at this time and she also does not appear to have UTI (no symptoms) -Received IV cefepime and vancomycin in ED, will continue with IV erythromycin and Rocephin for CAP coverage. -She is mildly tachypneic, diminished air entry at the bases, she was encouraged to use incentive spirometer.  Non- stemi -Troponin  significantly elevated, peaked at 2400, and EKG with acute changes, 2D echo shows decline in her EF 35 to 40%. -Continue with heparin gtt.. -Cardiology input greatly appreciated, plan for cardiac cath tomorrow  Acute on chronic blood loss anemia -With recent hip surgery, on full anticoagulation with heparin gtt. And NSTEMI, transfuse 1 unit PRBC as I would like her hemoglobin to be close to 10 in the setting of her current MI and full anticoagulation  HTN -Continue carvedilol, amlodipine -on PRN hydralazine    HLD -Continue Crestor -Check lipids   DM -A1c is 7.5, suboptimal control -Hold Jardiance -Switch 70/30 to sem-glee for now -Will cover with moderate-scale SSI for now   Hypothyroidism -Continue Synthroid   Recent hip fracture -Appears to be doing well from this standpoint -She is likely to need to return to rehab following this hospitalization     Obesity Body mass index is 32.1 kg/m.  Pressure ulcer -Present on admission -Consult wound care  Pressure Injury 06/19/22 Buttocks Left Stage 2 -  Partial thickness loss of dermis presenting as a shallow open injury with a red, pink wound bed without slough. (Active)  06/19/22 1202  Location: Buttocks  Location Orientation: Left  Staging: Stage 2 -  Partial thickness loss of dermis presenting as a shallow open injury with a red, pink wound bed without slough.  Wound Description (Comments):   Present on Admission: Yes       DVT prophylaxis: Heparin GTT Code Status: Full Family Communication: D/W sister in law at  [place] Disposition:   Status is: Inpatient    Consultants:  CArdiology  Subjective:  Denies any chest pain, but she does report dyspnea  Objective: Vitals:   06/20/22 0746 06/20/22 0900 06/20/22 0915 06/20/22 0932  BP: (!) 129/54 (!) 133/42  (!) 137/52  Pulse: 75   78  Resp: (!) 22 (!) 23 (!) 22 (!) 21  Temp: 97.8 F (36.6 C) 97.6 F (36.4 C)  98 F (36.7 C)  TempSrc: Oral Oral Oral    SpO2: 94%     Weight:      Height:        Intake/Output Summary (Last 24 hours) at 06/20/2022 1113 Last data filed at 06/20/2022 9983 Gross per 24 hour  Intake 897.54 ml  Output 550 ml  Net 347.54 ml   Filed Weights   06/19/22 0426  Weight: 84.8 kg    Examination:  Awake Alert, Oriented X 3, frail, ill-appearing Symmetrical Chest wall movement, tachypneic, diminished air entry at the bases  RRR,No Gallops,Rubs or new Murmurs, No Parasternal Heave +ve B.Sounds, Abd Soft, No tenderness, No rebound - guarding or rigidity. No Cyanosis, Clubbing or edema, No new Rash or bruise      Data Reviewed: I have personally reviewed following labs and imaging studies  CBC: Recent Labs  Lab 06/19/22 0430 06/19/22 0442 06/20/22 0056  WBC 12.9*  --  13.7*  NEUTROABS 12.0*  --   --   HGB 9.7* 9.9* 8.4*  HCT 31.1* 29.0* 26.3*  MCV 90.9  --  90.1  PLT 413*  --  382    Basic Metabolic Panel: Recent Labs  Lab 06/19/22 0430 06/19/22 0442 06/20/22 0056  NA 134* 134* 135  K 3.7 3.7 3.4*  CL 102 102 108  CO2 17*  --  17*  GLUCOSE 180* 178* 117*  BUN 36* 32* 36*  CREATININE 1.45* 1.30* 1.29*  CALCIUM 8.1*  --  7.8*    GFR: Estimated Creatinine Clearance: 36.6 mL/min (A) (by C-G formula based on SCr of 1.29 mg/dL (H)).  Liver Function Tests: Recent Labs  Lab 06/19/22 0430  AST 22  ALT 12  ALKPHOS 111  BILITOT 1.7*  PROT 6.3*  ALBUMIN 2.5*    CBG: Recent Labs  Lab 06/19/22 1657 06/19/22 1844 06/20/22 0224 06/20/22 0614 06/20/22 1103  GLUCAP 143* 136* 119* 116* 171*     Recent Results (from the past 240 hour(s))  Resp Panel by RT-PCR (Flu A&B, Covid) Peripheral     Status: None   Collection Time: 06/19/22  4:28 AM   Specimen: Peripheral; Nasal Swab  Result Value Ref Range Status   SARS Coronavirus 2 by RT PCR NEGATIVE NEGATIVE Final    Comment: (NOTE) SARS-CoV-2 target nucleic acids are NOT DETECTED.  The SARS-CoV-2 RNA is generally detectable in upper  respiratory specimens during the acute phase of infection. The lowest concentration of SARS-CoV-2 viral copies this assay can detect is 138 copies/mL. A negative result does not preclude SARS-Cov-2 infection and should not be used as the sole basis for treatment or other patient management decisions. A negative result may occur with  improper specimen collection/handling, submission of specimen other than nasopharyngeal swab, presence of viral mutation(s) within the areas targeted by this assay, and inadequate number of viral copies(<138 copies/mL). A negative result must be combined with clinical observations, patient history, and epidemiological information. The expected result is Negative.  Fact Sheet for Patients:  EntrepreneurPulse.com.au  Fact Sheet for Healthcare Providers:  IncredibleEmployment.be  This test  is no t yet approved or cleared by the [place] and  has been authorized for detection and/or diagnosis of SARS-CoV-2 by FDA under an Emergency Use Authorization (EUA). This EUA will remain  in effect (meaning this test can be used) for the duration of the COVID-19 declaration under Section 564(b)(1) of the Act, 21 U.S.C.section 360bbb-3(b)(1), unless the authorization is terminated  or revoked sooner.       Influenza A by PCR NEGATIVE NEGATIVE Final   Influenza B by PCR NEGATIVE NEGATIVE Final    Comment: (NOTE) The Xpert Xpress SARS-CoV-2/FLU/RSV plus assay is intended as an aid in the diagnosis of influenza from Nasopharyngeal swab specimens and should not be used as a sole basis for treatment. Nasal washings and aspirates are unacceptable for Xpert Xpress SARS-CoV-2/FLU/RSV testing.  Fact Sheet for Patients: EntrepreneurPulse.com.au  Fact Sheet for Healthcare Providers: IncredibleEmployment.be  This test is not yet approved or cleared by the [place] FDA and has been  authorized for detection and/or diagnosis of SARS-CoV-2 by FDA under an Emergency Use Authorization (EUA). This EUA will remain in effect (meaning this test can be used) for the duration of the COVID-19 declaration under Section 564(b)(1) of the Act, 21 U.S.C. section 360bbb-3(b)(1), unless the authorization is terminated or revoked.  Performed at [facility], [place] [address], [place], [place] [address]   Blood Culture (routine x 2)     Status: None (Preliminary result)   Collection Time: 06/19/22  4:30 AM   Specimen: BLOOD RIGHT WRIST  Result Value Ref Range Status   Specimen Description BLOOD RIGHT WRIST  Final   Special Requests   Final    BOTTLES DRAWN AEROBIC AND ANAEROBIC Blood Culture adequate volume   Culture   Final    NO GROWTH 1 DAY Performed at [facility], [place] [address] [place], [place] [address]    Report Status PENDING  Incomplete  Blood Culture (routine x 2)     Status: None (Preliminary result)   Collection Time: 06/19/22  4:36 AM   Specimen: BLOOD LEFT HAND  Result Value Ref Range Status   Specimen Description BLOOD LEFT HAND  Final   Special Requests   Final    BOTTLES DRAWN AEROBIC AND ANAEROBIC Blood Culture results may not be optimal due to an inadequate volume of blood received in culture bottles   Culture   Final    NO GROWTH 1 DAY Performed at [facility], [place] [address] [place], [place] [address]    Report Status PENDING  Incomplete  Urine Culture     Status: Abnormal (Preliminary result)   Collection Time: 06/19/22  4:54 AM   Specimen: In/Out Cath Urine  Result Value Ref Range Status   Specimen Description IN/OUT CATH URINE  Final   Special Requests NONE  Final   Culture (A)  Final    >=100,000 COLONIES/mL GRAM NEGATIVE RODS SUSCEPTIBILITIES TO FOLLOW Performed at [facility], [place] [address], [place], [place] [address]    Report Status PENDING  Incomplete         Radiology Studies: ECHOCARDIOGRAM  COMPLETE  Result Date: 06/19/2022    ECHOCARDIOGRAM REPORT   Patient Name:   ARMETTA HENRI Loch Date of Exam: 06/19/2022 Medical Rec #:  [id number]       Height:       64.0 in Accession #:    [id number]      Weight:       187.0  lb Date of Birth:  August 30, 1941        BSA:          1.901 m Patient Age:    67 years        BP:           106/44 mmHg Patient Gender: F               HR:           69 bpm. Exam Location:  Inpatient Procedure: 2D Echo, Cardiac Doppler, Color Doppler and Intracardiac            Opacification Agent Indications:    NSTEMI  History:        Patient has prior history of Echocardiogram examinations, most                 recent 11/09/2020. CAD, PAD, Arrythmias:Tachycardia; Risk                 Factors:Hypertension and Diabetes.  Sonographer:    Wenda Low Referring Phys: 11 RHONDA G BARRETT IMPRESSIONS  1. No left ventricular thrombus is seen (Definity contrast was used). Wall motion suggests infarction due to occlusion of the mid-LAD artery or a large ramus intermedius vessel that reaches the lateral apex. Left ventricular ejection fraction, by estimation, is 35 to 40%. The left ventricle has moderately decreased function. The left ventricle demonstrates regional wall motion abnormalities (see scoring diagram/findings for description). There is mild concentric left ventricular hypertrophy. Left  ventricular diastolic parameters are consistent with Grade II diastolic dysfunction (pseudonormalization). Elevated left atrial pressure.  2. Right ventricular systolic function is normal. The right ventricular size is normal. There is normal pulmonary artery systolic pressure. The estimated right ventricular systolic pressure is 62.6 mmHg.  3. Left atrial size was severely dilated.  4. The mitral valve is degenerative. Mild to moderate mitral valve regurgitation. No evidence of mitral stenosis. The mean mitral valve gradient is 3.1 mmHg. Moderate mitral annular calcification.  5. Tricuspid valve  regurgitation is mild to moderate.  6. The aortic valve is tricuspid. Aortic valve regurgitation is not visualized. No aortic stenosis is present. Comparison(s): Prior images unable to be directly viewed, comparison made by report only. The left ventricular function is significantly worse. The left ventricular wall motion abnormalities are new. FINDINGS  Left Ventricle: No left ventricular thrombus is seen (Definity contrast was used). Wall motion suggests infarction due to occlusion of the mid-LAD artery or a large ramus intermedius vessel that reaches the lateral apex. Left ventricular ejection fraction, by estimation, is 35 to 40%. The left ventricle has moderately decreased function. The left ventricle demonstrates regional wall motion abnormalities. Definity contrast agent was given IV to delineate the left ventricular endocardial borders. The left ventricular internal cavity size was normal in size. There is mild concentric left ventricular hypertrophy. Left ventricular diastolic parameters are consistent with Grade II diastolic dysfunction (pseudonormalization). Elevated left atrial pressure.  LV Wall Scoring: The apical lateral segment, mid anterolateral segment, apical anterior segment, and apex are akinetic. The anterior wall, entire septum, entire inferior wall, posterior wall, and basal anterolateral segment are normal. Right Ventricle: The right ventricular size is normal. No increase in right ventricular wall thickness. Right ventricular systolic function is normal. There is normal pulmonary artery systolic pressure. The tricuspid regurgitant velocity is 2.81 m/s, and  with an assumed right atrial pressure of 3 mmHg, the estimated right ventricular systolic pressure is 94.8 mmHg. Left Atrium: Left atrial size was severely  dilated. Right Atrium: Right atrial size was normal in size. Pericardium: There is no evidence of pericardial effusion. Mitral Valve: The mitral valve is degenerative in appearance.  Moderate mitral annular calcification. Mild to moderate mitral valve regurgitation, with centrally-directed jet. No evidence of mitral valve stenosis. MV peak gradient, 10.0 mmHg. The mean mitral valve gradient is 3.1 mmHg. Tricuspid Valve: The tricuspid valve is normal in structure. Tricuspid valve regurgitation is mild to moderate. Aortic Valve: The aortic valve is tricuspid. Aortic valve regurgitation is not visualized. No aortic stenosis is present. Aortic valve mean gradient measures 4.0 mmHg. Aortic valve peak gradient measures 9.0 mmHg. Aortic valve area, by VTI measures 1.90 cm. Pulmonic Valve: The pulmonic valve was normal in structure. Pulmonic valve regurgitation is not visualized. Aorta: The aortic root and ascending aorta are structurally normal, with no evidence of dilitation. IAS/Shunts: No atrial level shunt detected by color flow Doppler.  LEFT VENTRICLE PLAX 2D LVIDd:         5.40 cm     Diastology LVIDs:         3.30 cm     LV e' medial:    5.33 cm/s LV PW:         1.20 cm     LV E/e' medial:  26.6 LV IVS:        0.85 cm     LV e' lateral:   8.16 cm/s LVOT diam:     1.80 cm     LV E/e' lateral: 17.4 LV SV:         68 LV SV Index:   36 LVOT Area:     2.54 cm  LV Volumes (MOD) LV vol d, MOD A2C: 64.4 ml LV vol d, MOD A4C: 74.6 ml LV vol s, MOD A2C: 31.6 ml LV vol s, MOD A4C: 36.4 ml LV SV MOD A2C:     32.8 ml LV SV MOD A4C:     74.6 ml LV SV MOD BP:      35.9 ml RIGHT VENTRICLE RV Basal diam:  2.80 cm RV Mid diam:    2.50 cm RV S prime:     10.60 cm/s TAPSE (M-mode): 3.0 cm LEFT ATRIUM             Index        RIGHT ATRIUM           Index LA diam:        5.00 cm 2.63 cm/m   RA Area:     14.20 cm LA Vol (A2C):   66.8 ml 35.13 ml/m  RA Volume:   34.90 ml  18.36 ml/m LA Vol (A4C):   67.3 ml 35.40 ml/m LA Biplane Vol: 67.8 ml 35.66 ml/m  AORTIC VALVE                    PULMONIC VALVE AV Area (Vmax):    1.67 cm     PV Vmax:       0.85 m/s AV Area (Vmean):   1.87 cm     PV Peak grad:  2.9 mmHg  AV Area (VTI):     1.90 cm AV Vmax:           150.00 cm/s AV Vmean:          93.200 cm/s AV VTI:            0.357 m AV Peak Grad:      9.0 mmHg AV Mean Grad:  4.0 mmHg LVOT Vmax:         98.30 cm/s LVOT Vmean:        68.400 cm/s LVOT VTI:          0.266 m LVOT/AV VTI ratio: 0.75  AORTA Ao Root diam: 2.80 cm MITRAL VALVE                TRICUSPID VALVE MV Area (PHT): 3.06 cm     TR Peak grad:   31.6 mmHg MV Area VTI:   1.65 cm     TR Vmax:        281.00 cm/s MV Peak grad:  10.0 mmHg MV Mean grad:  3.1 mmHg     SHUNTS MV Vmax:       1.58 m/s     Systemic VTI:  0.27 m MV Vmean:      72.3 cm/s    Systemic Diam: 1.80 cm MV Decel Time: 248 msec MV E velocity: 142.00 cm/s MV A velocity: 74.20 cm/s MV E/A ratio:  1.91 Mihai Croitoru MD Electronically signed by Sanda Klein MD Signature Date/Time: 06/19/2022/11:24:42 AM    Final    CT Angio Chest PE W/Cm &/Or Wo Cm  Result Date: 06/19/2022 CLINICAL DATA:  Fever with shortness of breath and wheezing. Recent hip surgery. EXAM: CT ANGIOGRAPHY CHEST WITH CONTRAST TECHNIQUE: Multidetector CT imaging of the chest was performed using the standard protocol during bolus administration of intravenous contrast. Multiplanar CT image reconstructions and MIPs were obtained to evaluate the vascular anatomy. RADIATION DOSE REDUCTION: This exam was performed according to the departmental dose-optimization program which includes automated exposure control, adjustment of the mA and/or kV according to patient size and/or use of iterative reconstruction technique. CONTRAST:  41m OMNIPAQUE IOHEXOL 350 MG/ML SOLN COMPARISON:  None Available. FINDINGS: Cardiovascular: Heart is enlarged. No substantial pericardial effusion. Coronary artery calcification is evident. Mild atherosclerotic calcification is noted in the wall of the thoracic aorta. There is no filling defect within the opacified pulmonary arteries to suggest the presence of an acute pulmonary embolus. Mediastinum/Nodes: No  mediastinal lymphadenopathy. There is no hilar lymphadenopathy. The esophagus has normal imaging features. Tiny hiatal hernia noted. There is no axillary lymphadenopathy. Lungs/Pleura: Focal airspace disease identified right lower lobe on image 75/6 with a somewhat nodular ill-defined appearance. Subsegmental atelectasis noted in the lower lungs bilaterally. No overtly suspicious pulmonary nodule or mass. Upper Abdomen: Unremarkable. Musculoskeletal: No worrisome lytic or sclerotic osseous abnormality. Review of the MIP images confirms the above findings. IMPRESSION: 1. No CT evidence for acute pulmonary embolus. 2. Focal airspace disease right lower lobe with a somewhat nodular ill-defined appearance. Imaging features likely related to pneumonia although follow-up CT in 3 months recommended to ensure resolution. 3. Tiny hiatal hernia. 4. Aortic Atherosclerosis (ICD10-I70.0). Electronically Signed   By: EMisty StanleyM.D.   On: 06/19/2022 06:29   DG Chest Port 1 View  Result Date: 06/19/2022 CLINICAL DATA:  Questionable sepsis.  Shortness of breath. EXAM: PORTABLE CHEST 1 VIEW COMPARISON:  05/31/2022 FINDINGS: 0438 hours. The cardio pericardial silhouette is enlarged. Interstitial markings are diffusely coarsened with chronic features. The lungs are clear without focal pneumonia, edema, pneumothorax or pleural effusion. The visualized bony structures of the thorax are unremarkable. Telemetry leads overlie the chest. IMPRESSION: Chronic interstitial coarsening. No acute cardiopulmonary findings. Electronically Signed   By: EMisty StanleyM.D.   On: 06/19/2022 05:14        Scheduled Meds:  amLODipine  5 mg Oral Daily  carvedilol  6.25 mg Oral BID WC   docusate sodium  100 mg Oral BID   insulin aspart  0-15 Units Subcutaneous TID WC   insulin aspart  0-5 Units Subcutaneous QHS   insulin glargine-yfgn  15 Units Subcutaneous BID   isosorbide mononitrate  30 mg Oral Daily   leptospermum manuka honey  1  Application Topical Daily   levothyroxine  88 mcg Oral QHS   rosuvastatin  5 mg Oral Daily   sodium chloride flush  3 mL Intravenous Q12H   sodium chloride flush  3 mL Intravenous Q12H   Continuous Infusions:  azithromycin     cefTRIAXone (ROCEPHIN)  IV     heparin 1,300 Units/hr (06/20/22 0924)   promethazine (PHENERGAN) injection (IM or IVPB)       LOS: 1 day      Phillips Climes, MD [facility]   To contact the attending provider between 7A-7P or the covering provider during after hours 7P-7A, please log into the web site [email or web address] and access using universal [place] password for that web site. If you do not have the password, please call the hospital operator.  06/20/2022, 11:13 AM

## 2022-06-20 NOTE — H&P (Signed)
She was admitted with CHF and has new reduction in EF ~35%. Elevated troponinI with RWMA. Creat 1.23 this afternoon.

## 2022-06-20 NOTE — Progress Notes (Signed)
[place] for Heparin Indication: chest pain/ACS Brief A/P: Heparin level subtherapeutic Increase Heparin rate  Allergies  Allergen Reactions   Penicillins     Diffuse joint pain @ age 81 in context of Scarlet Fever  Did it involve swelling of the face/tongue/throat, SOB, or low BP? No Did it involve sudden or severe rash/hives, skin peeling, or any reaction on the inside of your mouth or nose? No Did you need to seek medical attention at a hospital or doctor's office? No When did it last happen?  childhood     If all above answers are "NO", may proceed with cephalosporin use.      Patient Measurements: Height: '5\' 4"'$  (162.6 cm) Weight: 84.8 kg (187 lb) IBW/kg (Calculated) : 54.7 Heparin Dosing Weight: 75 kg  Vital Signs: Temp: 97.8 F (36.6 C) (08/09 0746) Temp Source: Oral (08/09 0746) BP: 129/54 (08/09 0746) Pulse Rate: 75 (08/09 0746)  Labs: Recent Labs    06/19/22 0430 06/19/22 0442 06/19/22 0641 06/19/22 1512 06/20/22 0056 06/20/22 0536  HGB 9.7* 9.9*  --   --  8.4*  --   HCT 31.1* 29.0*  --   --  26.3*  --   PLT 413*  --   --   --  371  --   APTT 33  --   --   --   --   --   LABPROT 15.5*  --   --   --   --   --   INR 1.2  --   --   --   --   --   HEPARINUNFRC  --   --   --  0.36 0.24* 0.28*  CREATININE 1.45* 1.30*  --   --  1.29*  --   TROPONINIHS 2,421*  --  2,139*  --   --   --      Estimated Creatinine Clearance: 36.6 mL/min (A) (by C-G formula based on SCr of 1.29 mg/dL (H)).    Assessment: 81 y.o. female with NSTEMI for heparin. Heparin level today is subtherapeutic at 0.28, on 1150 units/hr. Hgb 8.4, plt 371. No line issues or signs/symptoms of bleeding per RN.   Goal of Therapy:  Heparin level 0.3-0.7 units/ml Monitor platelets by anticoagulation protocol: Yes   Plan:  Increase Heparin 1300 units/hr Check 8 hour heparin level.  Daily CBC, heparin level. Monitor for signs/symptoms of  bleeding.  Jeneen Rinks, Pharm.D PGY1 Pharmacy Resident 06/20/2022 8:50 AM

## 2022-06-20 NOTE — Progress Notes (Signed)
Mobility Specialist Progress Note    06/20/22 1654  Mobility  Activity Contraindicated/medical hold   RN advised d/t pain. Will f/u as appropriate.   Hildred Alamin Mobility Specialist

## 2022-06-20 NOTE — Progress Notes (Signed)
Respiratory status remains very tenuous, tachypneic, will repeat chest x-ray, and will broaden antibiotic coverage to close hospital-acquired pneumonia, will remain on vancomycin, cefepime and azithromycin. Phillips Climes MD

## 2022-06-20 NOTE — Progress Notes (Signed)
[place] for Heparin Indication: chest pain/ACS Brief A/P: Heparin level subtherapeutic Increase Heparin rate  Allergies  Allergen Reactions   Penicillins     Diffuse joint pain @ age 81 in context of Scarlet Fever  Did it involve swelling of the face/tongue/throat, SOB, or low BP? No Did it involve sudden or severe rash/hives, skin peeling, or any reaction on the inside of your mouth or nose? No Did you need to seek medical attention at a hospital or doctor's office? No When did it last happen?  childhood     If all above answers are "NO", may proceed with cephalosporin use.      Patient Measurements: Height: '5\' 4"'$  (162.6 cm) Weight: 84.8 kg (187 lb) IBW/kg (Calculated) : 54.7 Heparin Dosing Weight: 75 kg  Vital Signs: Temp: 98.7 F (37.1 C) (08/08 2348) Temp Source: Oral (08/08 2348) BP: 141/53 (08/08 2348) Pulse Rate: 67 (08/08 2348)  Labs: Recent Labs    06/19/22 0430 06/19/22 0442 06/19/22 0641 06/19/22 1512 06/20/22 0056  HGB 9.7* 9.9*  --   --  8.4*  HCT 31.1* 29.0*  --   --  26.3*  PLT 413*  --   --   --  371  APTT 33  --   --   --   --   LABPROT 15.5*  --   --   --   --   INR 1.2  --   --   --   --   HEPARINUNFRC  --   --   --  0.36 0.24*  CREATININE 1.45* 1.30*  --   --  1.29*  TROPONINIHS 2,421*  --  2,139*  --   --      Estimated Creatinine Clearance: 36.6 mL/min (A) (by C-G formula based on SCr of 1.29 mg/dL (H)).    Assessment: 81 y.o. female with NSTEMI for heparin  Goal of Therapy:  Heparin level 0.3-0.7 units/ml Monitor platelets by anticoagulation protocol: Yes   Plan:  Increase Heparin 1150 units/hr  Phillis Knack, PharmD, BCPS

## 2022-06-20 NOTE — Progress Notes (Addendum)
Pharmacy Antibiotic Note  Danielle Murray is a 81 y.o. female admitted on 06/19/2022 with pneumonia.  Pharmacy has been consulted for cefepime dosing.  Pt was admitted for fever with suspicion for PNA. She was started on vanc x1, ceftriaxone/azith. Due to her tenuous respiratory status, MD change abx to linezolid/cefepime for now. No hx of cultures in our system. Will get MRSA PCR to narrow abx. Avoiding vanc due to CKD. Will start linezolid in AM since she got vanc yesterday. Should be plenty in her system based on renal function.   Scr 1.23  Plan: Linezolid '600mg'$  PO q12 Cefepime 2g IV q12 F/u with MRSA PCR   Height: '5\' 4"'$  (162.6 cm) Weight: 84.8 kg (187 lb) IBW/kg (Calculated) : 54.7  Temp (24hrs), Avg:98.1 F (36.7 C), Min:97.6 F (36.4 C), Max:99.2 F (37.3 C)  Recent Labs  Lab 06/19/22 0430 06/19/22 0442 06/19/22 0641 06/20/22 0056 06/20/22 1500  WBC 12.9*  --   --  13.7*  --   CREATININE 1.45* 1.30*  --  1.29* 1.23*  LATICACIDVEN 1.2  --  1.1  --   --     Estimated Creatinine Clearance: 38.4 mL/min (A) (by C-G formula based on SCr of 1.23 mg/dL (H)).    Allergies  Allergen Reactions   Penicillins     Diffuse joint pain @ age 21 in context of Scarlet Fever  Did it involve swelling of the face/tongue/throat, SOB, or low BP? No Did it involve sudden or severe rash/hives, skin peeling, or any reaction on the inside of your mouth or nose? No Did you need to seek medical attention at a hospital or doctor's office? No When did it last happen?  childhood     If all above answers are "NO", may proceed with cephalosporin use.      Antimicrobials this admission: 8/8 vanc x1 8/8 cefepime>> 8/9 ceftriaxone x1 8/9 azithromycin x5  Dose adjustments this admission:  Microbiology results: MRSA PCR pend 8/8 blood>>ngtd  Onnie Boer, PharmD, [place], AAHIVP, CPP Infectious Disease Pharmacist 06/20/2022 4:15 PM

## 2022-06-20 NOTE — Progress Notes (Signed)
[place] for Heparin Indication: chest pain/ACS  Allergies  Allergen Reactions   Penicillins     Diffuse joint pain @ age 81 in context of Scarlet Fever  Did it involve swelling of the face/tongue/throat, SOB, or low BP? No Did it involve sudden or severe rash/hives, skin peeling, or any reaction on the inside of your mouth or nose? No Did you need to seek medical attention at a hospital or doctor's office? No When did it last happen?  childhood     If all above answers are "NO", may proceed with cephalosporin use.      Patient Measurements: Height: '5\' 4"'$  (162.6 cm) Weight: 84.8 kg (187 lb) IBW/kg (Calculated) : 54.7 Heparin Dosing Weight: 75 kg  Vital Signs: Temp: 97.8 F (36.6 C) (08/09 1500) Temp Source: Oral (08/09 1500) BP: 121/56 (08/09 1500) Pulse Rate: 80 (08/09 1500)  Labs: Recent Labs    06/19/22 0430 06/19/22 0442 06/19/22 0641 06/19/22 1512 06/20/22 0056 06/20/22 0536 06/20/22 1500  HGB 9.7* 9.9*  --   --  8.4*  --   --   HCT 31.1* 29.0*  --   --  26.3*  --   --   PLT 413*  --   --   --  371  --   --   APTT 33  --   --   --   --   --   --   LABPROT 15.5*  --   --   --   --   --   --   INR 1.2  --   --   --   --   --   --   HEPARINUNFRC  --   --   --    < > 0.24* 0.28* 0.22*  CREATININE 1.45* 1.30*  --   --  1.29*  --  1.23*  TROPONINIHS 2,421*  --  2,139*  --   --   --   --    < > = values in this interval not displayed.     Estimated Creatinine Clearance: 38.4 mL/min (A) (by C-G formula based on SCr of 1.23 mg/dL (H)).  Assessment: 81 y.o. female with NSTEMI for heparin. Plan for cath tomorrow.  Heparin level remains subtherapeutic (0.22) on infusion at 1300 units/hr. Heparin level decreased even though rate increased. No issues with line or bleeding reported per RN.  Goal of Therapy:  Heparin level 0.3-0.7 units/ml Monitor platelets by anticoagulation protocol: Yes   Plan:  Increase heparin to 1450  units/hr Check 8 hour heparin level  Sherlon Handing, PharmD, BCPS Please see amion for complete clinical pharmacist phone list 06/20/2022 4:23 PM

## 2022-06-21 ENCOUNTER — Encounter (HOSPITAL_COMMUNITY)
Admission: EM | Disposition: A | Discharge status: Skilled Nursing Facility | Payer: Self-pay | Source: Home / Self Care | Attending: Internal Medicine

## 2022-06-21 DIAGNOSIS — E038 Other specified hypothyroidism: Secondary | ICD-10-CM

## 2022-06-21 DIAGNOSIS — I5043 Acute on chronic combined systolic (congestive) and diastolic (congestive) heart failure: Secondary | ICD-10-CM

## 2022-06-21 DIAGNOSIS — I519 Heart disease, unspecified: Secondary | ICD-10-CM | POA: Diagnosis not present

## 2022-06-21 DIAGNOSIS — I214 Non-ST elevation (NSTEMI) myocardial infarction: Secondary | ICD-10-CM | POA: Diagnosis not present

## 2022-06-21 DIAGNOSIS — I2510 Atherosclerotic heart disease of native coronary artery without angina pectoris: Secondary | ICD-10-CM

## 2022-06-21 DIAGNOSIS — E1165 Type 2 diabetes mellitus with hyperglycemia: Secondary | ICD-10-CM | POA: Diagnosis not present

## 2022-06-21 DIAGNOSIS — J189 Pneumonia, unspecified organism: Secondary | ICD-10-CM

## 2022-06-21 HISTORY — PX: RIGHT/LEFT HEART CATH AND CORONARY ANGIOGRAPHY: CATH118266

## 2022-06-21 LAB — BASIC METABOLIC PANEL
Anion gap: 9 (ref 5–15)
Anion gap: 10 (ref 5–15)
BUN: 36 mg/dL — ABNORMAL HIGH (ref 8–23)
BUN: 37 mg/dL — ABNORMAL HIGH (ref 8–23)
CO2: 18 mmol/L — ABNORMAL LOW (ref 22–32)
CO2: 17 mmol/L — ABNORMAL LOW (ref 22–32)
Calcium: 7.9 mg/dL — ABNORMAL LOW (ref 8.9–10.3)
Calcium: 8 mg/dL — ABNORMAL LOW (ref 8.9–10.3)
Chloride: 107 mmol/L (ref 98–111)
Chloride: 107 mmol/L (ref 98–111)
Creatinine, Ser: 1.25 mg/dL — ABNORMAL HIGH (ref 0.44–1.00)
Creatinine, Ser: 1.29 mg/dL — ABNORMAL HIGH (ref 0.44–1.00)
GFR, Estimated: 44 mL/min — ABNORMAL LOW (ref >60)
GFR, Estimated: 42 mL/min — ABNORMAL LOW (ref >60)
Glucose, Bld: 139 mg/dL — ABNORMAL HIGH (ref 70–99)
Glucose, Bld: 130 mg/dL — ABNORMAL HIGH (ref 70–99)
Potassium: 4 mmol/L (ref 3.5–5.1)
Potassium: 4 mmol/L (ref 3.5–5.1)
Sodium: 134 mmol/L — ABNORMAL LOW (ref 135–145)
Sodium: 134 mmol/L — ABNORMAL LOW (ref 135–145)

## 2022-06-21 LAB — CBC
HCT: 28.7 % — ABNORMAL LOW (ref 36.0–46.0)
HCT: 28.9 % — ABNORMAL LOW (ref 36.0–46.0)
Hemoglobin: 9.5 g/dL — ABNORMAL LOW (ref 12.0–15.0)
Hemoglobin: 9.3 g/dL — ABNORMAL LOW (ref 12.0–15.0)
MCH: 28.9 pg (ref 26.0–34.0)
MCH: 28.3 pg (ref 26.0–34.0)
MCHC: 33.1 g/dL (ref 30.0–36.0)
MCHC: 32.2 g/dL (ref 30.0–36.0)
MCV: 87.2 fL (ref 80.0–100.0)
MCV: 87.8 fL (ref 80.0–100.0)
Platelets: 319 10*3/uL (ref 150–400)
Platelets: 295 10*3/uL (ref 150–400)
RBC: 3.29 MIL/uL — ABNORMAL LOW (ref 3.87–5.11)
RBC: 3.29 MIL/uL — ABNORMAL LOW (ref 3.87–5.11)
RDW: 14.6 % (ref 11.5–15.5)
RDW: 14.7 % (ref 11.5–15.5)
WBC: 12.1 10*3/uL — ABNORMAL HIGH (ref 4.0–10.5)
WBC: 11.8 10*3/uL — ABNORMAL HIGH (ref 4.0–10.5)
nRBC: 0 % (ref 0.0–0.2)
nRBC: 0 % (ref 0.0–0.2)

## 2022-06-21 LAB — TYPE AND SCREEN
ABO/RH(D): A NEG
Antibody Screen: NEGATIVE
Unit division: 0

## 2022-06-21 LAB — POCT I-STAT EG7
Acid-base deficit: 7 mmol/L — ABNORMAL HIGH (ref 0.0–2.0)
Bicarbonate: 17.9 mmol/L — ABNORMAL LOW (ref 20.0–28.0)
Calcium, Ion: 1.19 mmol/L (ref 1.15–1.40)
HCT: 30 % — ABNORMAL LOW (ref 36.0–46.0)
Hemoglobin: 10.2 g/dL — ABNORMAL LOW (ref 12.0–15.0)
O2 Saturation: 58 %
Potassium: 4.2 mmol/L (ref 3.5–5.1)
Sodium: 137 mmol/L (ref 135–145)
TCO2: 19 mmol/L — ABNORMAL LOW (ref 22–32)
pCO2, Ven: 31.6 mmHg — ABNORMAL LOW (ref 44–60)
pH, Ven: 7.362 (ref 7.25–7.43)
pO2, Ven: 31 mmHg — CL (ref 32–45)

## 2022-06-21 LAB — POCT I-STAT 7, (LYTES, BLD GAS, ICA,H+H)
Acid-base deficit: 10 mmol/L — ABNORMAL HIGH (ref 0.0–2.0)
Bicarbonate: 15.5 mmol/L — ABNORMAL LOW (ref 20.0–28.0)
Calcium, Ion: 1.17 mmol/L (ref 1.15–1.40)
HCT: 30 % — ABNORMAL LOW (ref 36.0–46.0)
Hemoglobin: 10.2 g/dL — ABNORMAL LOW (ref 12.0–15.0)
O2 Saturation: 97 %
Potassium: 4 mmol/L (ref 3.5–5.1)
Sodium: 129 mmol/L — ABNORMAL LOW (ref 135–145)
TCO2: 16 mmol/L — ABNORMAL LOW (ref 22–32)
pCO2 arterial: 31.2 mmHg — ABNORMAL LOW (ref 32–48)
pH, Arterial: 7.304 — ABNORMAL LOW (ref 7.35–7.45)
pO2, Arterial: 95 mmHg (ref 83–108)

## 2022-06-21 LAB — GLUCOSE, CAPILLARY
Glucose-Capillary: 135 mg/dL — ABNORMAL HIGH (ref 70–99)
Glucose-Capillary: 125 mg/dL — ABNORMAL HIGH (ref 70–99)
Glucose-Capillary: 117 mg/dL — ABNORMAL HIGH (ref 70–99)
Glucose-Capillary: 122 mg/dL — ABNORMAL HIGH (ref 70–99)
Glucose-Capillary: 128 mg/dL — ABNORMAL HIGH (ref 70–99)

## 2022-06-21 LAB — URINE CULTURE: Culture: >=100000 — AB

## 2022-06-21 LAB — BPAM RBC
Blood Product Expiration Date: 202309022359
ISSUE DATE / TIME: 202308090911
Unit Type and Rh: 600

## 2022-06-21 LAB — HEPARIN LEVEL (UNFRACTIONATED): Heparin Unfractionated: 0.37 IU/mL (ref 0.30–0.70)

## 2022-06-21 LAB — PROCALCITONIN: Procalcitonin: 26.12 ng/mL

## 2022-06-21 SURGERY — RIGHT/LEFT HEART CATH AND CORONARY ANGIOGRAPHY
Anesthesia: LOCAL

## 2022-06-21 MED ORDER — ONDANSETRON HCL 4 MG/2ML IJ SOLN: 4.0000 mg | Freq: Four times a day (QID) | INTRAMUSCULAR | Status: DC | PRN

## 2022-06-21 MED ORDER — VERAPAMIL HCL 2.5 MG/ML IV SOLN
INTRAVENOUS | Status: AC
  Filled 2022-06-21: qty 2

## 2022-06-21 MED ORDER — FUROSEMIDE 10 MG/ML IJ SOLN
20.0000 mg | Freq: Once | INTRAMUSCULAR | Status: AC
  Administered 2022-06-21: 20 mg via INTRAVENOUS
  Filled 2022-06-21: qty 2

## 2022-06-21 MED ORDER — HEPARIN (PORCINE) IN NACL 1000-0.9 UT/500ML-% IV SOLN
INTRAVENOUS | Status: DC | PRN
  Administered 2022-06-21 (×2): 500 mL

## 2022-06-21 MED ORDER — LIDOCAINE HCL (PF) 1 % IJ SOLN
INTRAMUSCULAR | Status: DC | PRN
  Administered 2022-06-21: 1 mL
  Administered 2022-06-21: 2 mL

## 2022-06-21 MED ORDER — HEPARIN SODIUM (PORCINE) 1000 UNIT/ML IJ SOLN
INTRAMUSCULAR | Status: DC | PRN
  Administered 2022-06-21: 3000 [IU] via INTRAVENOUS

## 2022-06-21 MED ORDER — HEPARIN SODIUM (PORCINE) 1000 UNIT/ML IJ SOLN
INTRAMUSCULAR | Status: AC
  Filled 2022-06-21: qty 10

## 2022-06-21 MED ORDER — ASPIRIN 81 MG PO CHEW: 81.0000 mg | CHEWABLE_TABLET | Freq: Every day | ORAL | Status: DC

## 2022-06-21 MED ORDER — SODIUM CHLORIDE 0.9% FLUSH
3.0000 mL | INTRAVENOUS | Status: DC | PRN
Start: 2022-06-21 — End: 2022-06-29

## 2022-06-21 MED ORDER — ACETAMINOPHEN 325 MG PO TABS: 650.0000 mg | ORAL_TABLET | ORAL | Status: DC | PRN

## 2022-06-21 MED ORDER — SODIUM CHLORIDE 0.9 % IV SOLN: INTRAVENOUS | Status: AC

## 2022-06-21 MED ORDER — LABETALOL HCL 5 MG/ML IV SOLN
10.0000 mg | INTRAVENOUS | Status: AC | PRN
Start: 2022-06-21 — End: 2022-06-22

## 2022-06-21 MED ORDER — SODIUM CHLORIDE 0.9% FLUSH
3.0000 mL | Freq: Two times a day (BID) | INTRAVENOUS | Status: DC
  Administered 2022-06-22 – 2022-06-28 (×5): 3 mL via INTRAVENOUS

## 2022-06-21 MED ORDER — IOHEXOL 350 MG/ML SOLN
INTRAVENOUS | Status: DC | PRN
  Administered 2022-06-21: 60 mL

## 2022-06-21 MED ORDER — HEPARIN SODIUM (PORCINE) 5000 UNIT/ML IJ SOLN
5000.0000 [IU] | Freq: Three times a day (TID) | INTRAMUSCULAR | Status: DC
  Administered 2022-06-21 – 2022-06-25 (×11): 5000 [IU] via SUBCUTANEOUS
  Filled 2022-06-21 (×11): qty 1

## 2022-06-21 MED ORDER — ROSUVASTATIN CALCIUM 20 MG PO TABS
20.0000 mg | ORAL_TABLET | Freq: Every day | ORAL | Status: DC
  Administered 2022-06-22 – 2022-06-28 (×7): 20 mg via ORAL
  Filled 2022-06-21 (×7): qty 1

## 2022-06-21 MED ORDER — HEPARIN (PORCINE) IN NACL 1000-0.9 UT/500ML-% IV SOLN
INTRAVENOUS | Status: AC
  Filled 2022-06-21: qty 1000

## 2022-06-21 MED ORDER — SODIUM CHLORIDE 0.9 % IV SOLN
250.0000 mL | INTRAVENOUS | Status: DC | PRN
Start: 2022-06-21 — End: 2022-06-29

## 2022-06-21 MED ORDER — VERAPAMIL HCL 2.5 MG/ML IV SOLN
INTRAVENOUS | Status: DC | PRN
  Administered 2022-06-21: 10 mL via INTRA_ARTERIAL

## 2022-06-21 MED ORDER — HYDRALAZINE HCL 20 MG/ML IJ SOLN: 10.0000 mg | INTRAMUSCULAR | Status: AC | PRN

## 2022-06-21 MED ORDER — LIDOCAINE HCL (PF) 1 % IJ SOLN
INTRAMUSCULAR | Status: AC
  Filled 2022-06-21: qty 30

## 2022-06-21 SURGICAL SUPPLY — 13 items
BAND CMPR LRG ZPHR (HEMOSTASIS) ×1
BAND ZEPHYR COMPRESS 30 LONG (HEMOSTASIS) ×1 IMPLANT
CATH 5FR JL3.5 JR4 ANG PIG MP (CATHETERS) ×1 IMPLANT
CATH BALLN WEDGE 5F 110CM (CATHETERS) ×1 IMPLANT
GLIDESHEATH SLEND A-KIT 6F 22G (SHEATH) ×1 IMPLANT
GUIDEWIRE INQWIRE 1.5J.035X260 (WIRE) IMPLANT
INQWIRE 1.5J .035X260CM (WIRE) ×2
KIT HEART LEFT (KITS) ×2 IMPLANT
PACK CARDIAC CATHETERIZATION (CUSTOM PROCEDURE TRAY) ×2 IMPLANT
SHEATH GLIDE SLENDER 4/5FR (SHEATH) ×1 IMPLANT
SHEATH PROBE COVER 6X72 (BAG) ×1 IMPLANT
TRANSDUCER W/STOPCOCK (MISCELLANEOUS) ×2 IMPLANT
TUBING CIL FLEX 10 FLL-RA (TUBING) ×2 IMPLANT

## 2022-06-21 NOTE — Plan of Care (Signed)
  Problem: Activity: Goal: Ability to return to baseline activity level will improve Outcome: Progressing   Problem: Activity: Goal: Ability to tolerate increased activity will improve Outcome: Progressing   Problem: Clinical Measurements: Goal: Respiratory complications will improve Outcome: Progressing   Problem: Nutrition: Goal: Adequate nutrition will be maintained Outcome: Progressing   Problem: Coping: Goal: Level of anxiety will decrease Outcome: Progressing   Problem: Elimination: Goal: Will not experience complications related to urinary retention Outcome: Progressing   Problem: Pain Managment: Goal: General experience of comfort will improve Outcome: Progressing   Problem: Safety: Goal: Ability to remain free from injury will improve Outcome: Progressing

## 2022-06-21 NOTE — CV Procedure (Signed)
20% ostial proximal left main Circumflex is dominant and contains 70% segmental ostial proximal narrowing.  First obtuse marginal has severe diffuse disease.  The vessel is relatively small.  PDA contains segmental 70% stenosis. LAD contains ostial to proximal irregularities with widely patent previously placed mid stent.  A large diagonal appears to be jailed and is equal to the size of the LAD in terms of myocardium served Right coronary is nondominant contains ostial 60% calcified stenosis.  Followed by diffuse disease throughout the mid segment up to 70%. LV function not assessed.  LVEDP 31 mmHg. Right heart cath demonstrated PA systolic pressure of 60 with a mean pressure of 38 mmHg and cardiac output of 5.13 index 2.7.  Wedge pressure 26 mmHg.  Moderate to severe WHO pulmonary hypertension group 2 related to left heart failure.

## 2022-06-21 NOTE — Progress Notes (Signed)
Mobility Specialist Progress Note    06/21/22 1439  Mobility  Activity Off unit   Pt at procedure. Will f/u as schedule permits.   Hildred Alamin Mobility Specialist

## 2022-06-21 NOTE — Progress Notes (Signed)
Progress Note  Patient Name: Danielle Murray Date of Encounter: 06/21/2022  [facility] Cardiologist: Jenkins Rouge, MD   Subjective   Shortness of breath improved.  No chest pain.  Inpatient Medications    Scheduled Meds:  albuterol  2.5 mg Nebulization Q6H   amLODipine  5 mg Oral Daily   aspirin EC  81 mg Oral Daily   carvedilol  6.25 mg Oral BID WC   docusate sodium  100 mg Oral BID   insulin aspart  0-15 Units Subcutaneous TID WC   insulin aspart  0-5 Units Subcutaneous QHS   insulin glargine-yfgn  15 Units Subcutaneous BID   isosorbide mononitrate  30 mg Oral Daily   leptospermum manuka honey  1 Application Topical Daily   levothyroxine  88 mcg Oral QHS   linezolid  600 mg Oral BID   rosuvastatin  20 mg Oral Daily   sodium chloride flush  3 mL Intravenous Q12H   sodium chloride flush  3 mL Intravenous Q12H   Continuous Infusions:  sodium chloride     sodium chloride 50 mL/hr at 06/21/22 0148   azithromycin 500 mg (06/20/22 1433)   ceFEPime (MAXIPIME) IV 2 g (06/21/22 5188)   heparin 1,450 Units/hr (06/20/22 2226)   promethazine (PHENERGAN) injection (IM or IVPB)     PRN Meds: sodium chloride, acetaminophen **OR** acetaminophen, bisacodyl, guaiFENesin, hydrALAZINE, morphine injection, ondansetron **OR** ondansetron (ZOFRAN) IV, oxyCODONE, polyethylene glycol, simethicone, sodium chloride flush   Vital Signs    Vitals:   06/20/22 2319 06/21/22 0000 06/21/22 0351 06/21/22 0735  BP:  (!) 118/44 (!) 129/50 (!) 122/38  Pulse:  68 68 67  Resp:  (!) 30 (!) 27 (!) 25  Temp:   98.5 F (36.9 C) 98.6 F (37 C)  TempSrc:   Oral Oral  SpO2: 95% 95% 93% 94%  Weight:      Height:        Intake/Output Summary (Last 24 hours) at 06/21/2022 0823 Last data filed at 06/21/2022 0400 Gross per 24 hour  Intake 745.49 ml  Output 1650 ml  Net -904.51 ml      06/19/2022    4:26 AM 06/02/2022    9:29 AM 06/01/2022    8:44 PM  Last 3 Weights  Weight (lbs) 187 lb 185 lb  180 lb  Weight (kg) 84.823 kg 83.915 kg 81.647 kg      Telemetry    Normal sinus rhythm- Personally Reviewed  ECG    Not performed today- Personally Reviewed  Physical Exam   GEN: No acute distress.   Neck: No JVD Cardiac: RRR, no murmurs, rubs, or gallops.  Respiratory: Clear to auscultation bilaterally. GI: Soft, nontender, non-distended  MS: No edema; No deformity. Neuro:  Nonfocal  Psych: Normal affect   Labs    High Sensitivity Troponin:   Recent Labs  Lab 06/19/22 0430 06/19/22 0641  TROPONINIHS 2,421* 2,139*     Chemistry Recent Labs  Lab 06/19/22 0430 06/19/22 0442 06/20/22 1500 06/20/22 2348 06/21/22 0642  NA 134*   < > 134* 134* 134*  K 3.7   < > 4.3 4.0 4.0  CL 102   < > 105 107 107  CO2 17*   < > 18* 18* 17*  GLUCOSE 180*   < > 286* 139* 130*  BUN 36*   < > 36* 36* 37*  CREATININE 1.45*   < > 1.23* 1.25* 1.29*  CALCIUM 8.1*   < > 8.0* 7.9* 8.0*  PROT  6.3*  --   --   --   --   ALBUMIN 2.5*  --   --   --   --   AST 22  --   --   --   --   ALT 12  --   --   --   --   ALKPHOS 111  --   --   --   --   BILITOT 1.7*  --   --   --   --   GFRNONAA 36*   < > 44* 44* 42*  ANIONGAP 15   < > '11 9 10   '$ < > = values in this interval not displayed.    Lipids No results for input(s): "CHOL", "TRIG", "HDL", "LABVLDL", "LDLCALC", "CHOLHDL" in the last 168 hours.  Hematology Recent Labs  Lab 06/20/22 0056 06/20/22 2348 06/21/22 0642  WBC 13.7* 12.1* 11.8*  RBC 2.92* 3.29* 3.29*  HGB 8.4* 9.5* 9.3*  HCT 26.3* 28.7* 28.9*  MCV 90.1 87.2 87.8  MCH 28.8 28.9 28.3  MCHC 31.9 33.1 32.2  RDW 14.6 14.6 14.7  PLT 371 319 295   Thyroid No results for input(s): "TSH", "FREET4" in the last 168 hours.  BNP Recent Labs  Lab 06/19/22 0921  BNP 1,579.3*    DDimer No results for input(s): "DDIMER" in the last 168 hours.   Radiology    DG Chest Port 1 View  Result Date: 06/20/2022 CLINICAL DATA:  Dyspnea, fever EXAM: PORTABLE CHEST 1 VIEW COMPARISON:   06/19/2022 FINDINGS: Stable cardiomediastinal contours. Progressive patchy airspace opacity within the right lower lobe. Mild streaky left basilar opacity. No pleural effusion or pneumothorax. IMPRESSION: Progressive patchy airspace opacity within the right lower lobe, concerning for pneumonia. Mild streaky left basilar opacity could reflect atelectasis or additional focus of infection. Electronically Signed   By: Davina Poke D.O.   On: 06/20/2022 16:35   ECHOCARDIOGRAM COMPLETE  Result Date: 06/19/2022    ECHOCARDIOGRAM REPORT   Patient Name:   Danielle Murray Date of Exam: 06/19/2022 Medical Rec #:  [id number]       Height:       64.0 in Accession #:    [id number]      Weight:       187.0 lb Date of Birth:  [birth year]        BSA:          1.901 m Patient Age:    82 years        BP:           106/44 mmHg Patient Gender: F               HR:           69 bpm. Exam Location:  Inpatient Procedure: 2D Echo, Cardiac Doppler, Color Doppler and Intracardiac            Opacification Agent Indications:    NSTEMI  History:        Patient has prior history of Echocardiogram examinations, most                 recent 11/09/2020. CAD, PAD, Arrythmias:Tachycardia; Risk                 Factors:Hypertension and Diabetes.  Sonographer:    Wenda Low Referring Phys: 50 RHONDA G BARRETT IMPRESSIONS  1. No left ventricular thrombus is seen (Definity contrast was used). Wall motion suggests infarction due to occlusion of the mid-LAD artery or  a large ramus intermedius vessel that reaches the lateral apex. Left ventricular ejection fraction, by estimation, is 35 to 40%. The left ventricle has moderately decreased function. The left ventricle demonstrates regional wall motion abnormalities (see scoring diagram/findings for description). There is mild concentric left ventricular hypertrophy. Left  ventricular diastolic parameters are consistent with Grade II diastolic dysfunction (pseudonormalization). Elevated left atrial  pressure.  2. Right ventricular systolic function is normal. The right ventricular size is normal. There is normal pulmonary artery systolic pressure. The estimated right ventricular systolic pressure is 98.1 mmHg.  3. Left atrial size was severely dilated.  4. The mitral valve is degenerative. Mild to moderate mitral valve regurgitation. No evidence of mitral stenosis. The mean mitral valve gradient is 3.1 mmHg. Moderate mitral annular calcification.  5. Tricuspid valve regurgitation is mild to moderate.  6. The aortic valve is tricuspid. Aortic valve regurgitation is not visualized. No aortic stenosis is present. Comparison(s): Prior images unable to be directly viewed, comparison made by report only. The left ventricular function is significantly worse. The left ventricular wall motion abnormalities are new. FINDINGS  Left Ventricle: No left ventricular thrombus is seen (Definity contrast was used). Wall motion suggests infarction due to occlusion of the mid-LAD artery or a large ramus intermedius vessel that reaches the lateral apex. Left ventricular ejection fraction, by estimation, is 35 to 40%. The left ventricle has moderately decreased function. The left ventricle demonstrates regional wall motion abnormalities. Definity contrast agent was given IV to delineate the left ventricular endocardial borders. The left ventricular internal cavity size was normal in size. There is mild concentric left ventricular hypertrophy. Left ventricular diastolic parameters are consistent with Grade II diastolic dysfunction (pseudonormalization). Elevated left atrial pressure.  LV Wall Scoring: The apical lateral segment, mid anterolateral segment, apical anterior segment, and apex are akinetic. The anterior wall, entire septum, entire inferior wall, posterior wall, and basal anterolateral segment are normal. Right Ventricle: The right ventricular size is normal. No increase in right ventricular wall thickness. Right  ventricular systolic function is normal. There is normal pulmonary artery systolic pressure. The tricuspid regurgitant velocity is 2.81 m/s, and  with an assumed right atrial pressure of 3 mmHg, the estimated right ventricular systolic pressure is 19.1 mmHg. Left Atrium: Left atrial size was severely dilated. Right Atrium: Right atrial size was normal in size. Pericardium: There is no evidence of pericardial effusion. Mitral Valve: The mitral valve is degenerative in appearance. Moderate mitral annular calcification. Mild to moderate mitral valve regurgitation, with centrally-directed jet. No evidence of mitral valve stenosis. MV peak gradient, 10.0 mmHg. The mean mitral valve gradient is 3.1 mmHg. Tricuspid Valve: The tricuspid valve is normal in structure. Tricuspid valve regurgitation is mild to moderate. Aortic Valve: The aortic valve is tricuspid. Aortic valve regurgitation is not visualized. No aortic stenosis is present. Aortic valve mean gradient measures 4.0 mmHg. Aortic valve peak gradient measures 9.0 mmHg. Aortic valve area, by VTI measures 1.90 cm. Pulmonic Valve: The pulmonic valve was normal in structure. Pulmonic valve regurgitation is not visualized. Aorta: The aortic root and ascending aorta are structurally normal, with no evidence of dilitation. IAS/Shunts: No atrial level shunt detected by color flow Doppler.  LEFT VENTRICLE PLAX 2D LVIDd:         5.40 cm     Diastology LVIDs:         3.30 cm     LV e' medial:    5.33 cm/s LV PW:  1.20 cm     LV E/e' medial:  26.6 LV IVS:        0.85 cm     LV e' lateral:   8.16 cm/s LVOT diam:     1.80 cm     LV E/e' lateral: 17.4 LV SV:         68 LV SV Index:   36 LVOT Area:     2.54 cm  LV Volumes (MOD) LV vol d, MOD A2C: 64.4 ml LV vol d, MOD A4C: 74.6 ml LV vol s, MOD A2C: 31.6 ml LV vol s, MOD A4C: 36.4 ml LV SV MOD A2C:     32.8 ml LV SV MOD A4C:     74.6 ml LV SV MOD BP:      35.9 ml RIGHT VENTRICLE RV Basal diam:  2.80 cm RV Mid diam:    2.50  cm RV S prime:     10.60 cm/s TAPSE (M-mode): 3.0 cm LEFT ATRIUM             Index        RIGHT ATRIUM           Index LA diam:        5.00 cm 2.63 cm/m   RA Area:     14.20 cm LA Vol (A2C):   66.8 ml 35.13 ml/m  RA Volume:   34.90 ml  18.36 ml/m LA Vol (A4C):   67.3 ml 35.40 ml/m LA Biplane Vol: 67.8 ml 35.66 ml/m  AORTIC VALVE                    PULMONIC VALVE AV Area (Vmax):    1.67 cm     PV Vmax:       0.85 m/s AV Area (Vmean):   1.87 cm     PV Peak grad:  2.9 mmHg AV Area (VTI):     1.90 cm AV Vmax:           150.00 cm/s AV Vmean:          93.200 cm/s AV VTI:            0.357 m AV Peak Grad:      9.0 mmHg AV Mean Grad:      4.0 mmHg LVOT Vmax:         98.30 cm/s LVOT Vmean:        68.400 cm/s LVOT VTI:          0.266 m LVOT/AV VTI ratio: 0.75  AORTA Ao Root diam: 2.80 cm MITRAL VALVE                TRICUSPID VALVE MV Area (PHT): 3.06 cm     TR Peak grad:   31.6 mmHg MV Area VTI:   1.65 cm     TR Vmax:        281.00 cm/s MV Peak grad:  10.0 mmHg MV Mean grad:  3.1 mmHg     SHUNTS MV Vmax:       1.58 m/s     Systemic VTI:  0.27 m MV Vmean:      72.3 cm/s    Systemic Diam: 1.80 cm MV Decel Time: 248 msec MV E velocity: 142.00 cm/s MV A velocity: 74.20 cm/s MV E/A ratio:  1.91 Mihai Croitoru MD Electronically signed by Sanda Klein MD Signature Date/Time: 06/19/2022/11:24:42 AM    Final     Cardiac Studies   2D echocardiogram (06/19/2022)  IMPRESSIONS  1. No left ventricular thrombus is seen (Definity contrast was used).  Wall motion suggests infarction due to occlusion of the mid-LAD artery or  a large ramus intermedius vessel that reaches the lateral apex. Left  ventricular ejection fraction, by  estimation, is 35 to 40%. The left ventricle has moderately decreased  function. The left ventricle demonstrates regional wall motion  abnormalities (see scoring diagram/findings for description). There is  mild concentric left ventricular hypertrophy. Left   ventricular diastolic parameters  are consistent with Grade II diastolic  dysfunction (pseudonormalization). Elevated left atrial pressure.   2. Right ventricular systolic function is normal. The right ventricular  size is normal. There is normal pulmonary artery systolic pressure. The  estimated right ventricular systolic pressure is 55.3 mmHg.   3. Left atrial size was severely dilated.   4. The mitral valve is degenerative. Mild to moderate mitral valve  regurgitation. No evidence of mitral stenosis. The mean mitral valve  gradient is 3.1 mmHg. Moderate mitral annular calcification.   5. Tricuspid valve regurgitation is mild to moderate.   6. The aortic valve is tricuspid. Aortic valve regurgitation is not  visualized. No aortic stenosis is present.  Patient Profile   Danielle Murray is a 81 y.o. female with a hx of LE fx and repair (d/c 07/25), DES LAD 2014 w/ med rx for RCA/PDA dz, DM2, HTN, HLD, SVT, depression, diverticulosis, who is being seen 06/19/2022 for the evaluation of NSTEMI at the request of Dr Sherry Ruffing.   Assessment & Plan    1: Non-STEMI-troponins rose to 2400.  EKG showed anterolateral T wave inversion.  2D echo shows decline in EF of 35 to 40% with wall motion abnormality suggesting LAD disease.  This is in the distribution of the prior stent.  She is on IV heparin.  She has had no chest pain.  Will plan diagnostic coronary angiography tomorrow.  2: Hospital-acquired pneumonia-seen on chest CTA.  On antibiotics per primary care team.  3: Anemia-hemoglobin 8.4 down from 9.7.  Patient had transfusion of packed red blood cells.  Her hemoglobin is back up to 9.3 this morning.  Given patient's EKG changes and 2D echo findings she will need diagnostic coronary angiography.  Patient is scheduled for today.  According to the cath note performed Dr. Burt Knack 09/03/2013, he was unable to get up right radial despite vasodilators.  Patient should probably be cath via the femoral approach but I will leave this up to Dr.  Tamala Julian..       For questions or updates, please contact [place] Please consult [email or web address] for contact info under        Signed, Quay Burow, MD  06/21/2022, 8:23 AM

## 2022-06-21 NOTE — Consult Note (Signed)
[place] Nurse wound follow up Patient receiving care in [facility] 2C4. Spouse and Dr. Waldron Labs present at time of my wound assessment Wound type: unstageable PI to sacrum/coccyx area Measurement: 3 cm x 3 cm  x unknown depth Wound bed: yellow slough, see photo from today Drainage (amount, consistency, odor) yellow on existing foam only dressing. No gauze present. Periwound: erythematous but blanchable Dressing procedure/placement/frequency: Continue the outline POC with medihoney.  Monitor the wound area(s) for worsening of condition such as: Signs/symptoms of infection,  Increase in size,  Development of or worsening of odor, Development of pain, or increased pain at the affected locations.  Notify the medical team if any of these develop.  [place] nurse will not follow at this time.  Please re-consult the [place] team if needed.  Val Riles, RN, MSN, CWOCN, CNS-BC, pager [phone]

## 2022-06-21 NOTE — Progress Notes (Addendum)
Patient TRBand from cath lab is not documented on assessment flowsheet, reported back to me that [place] were placed in band at 1536---removd 3cc at 1630, removed 3cc at 1725 with remaining 9cc in band--no complications, no bleeding, no hematoma--remvd 3cc at 1745, remvd 3cc at 1810, remvd 3cc at 1835, no hematoma,no bleeding no complications, will leaVe armband on for a short period to make sure bleeding does not occur

## 2022-06-21 NOTE — Interval H&P Note (Signed)
Cath Lab Visit (complete for each Cath Lab visit)  Clinical Evaluation Leading to the Procedure:   ACS: Yes.    Non-ACS:    Anginal Classification: CCS III  Anti-ischemic medical therapy: Maximal Therapy (2 or more classes of medications)  Non-Invasive Test Results: Equivocal test results  Prior CABG: No previous CABG      History and Physical Interval Note:  06/21/2022 2:20 PM  Danielle Murray  has presented today for surgery, with the diagnosis of nstemi.  The various methods of treatment have been discussed with the patient and family. After consideration of risks, benefits and other options for treatment, the patient has consented to  Procedure(s): RIGHT/LEFT HEART CATH AND CORONARY ANGIOGRAPHY (N/A) as a surgical intervention.  The patient's history has been reviewed, patient examined, no change in status, stable for surgery.  I have reviewed the patient's chart and labs.  Questions were answered to the patient's satisfaction.     Belva Crome III

## 2022-06-21 NOTE — H&P (View-Only) (Signed)
Progress Note  Patient Name: Danielle Murray Date of Encounter: 06/21/2022  [facility] Cardiologist: Jenkins Rouge, MD   Subjective   Shortness of breath improved.  No chest pain.  Inpatient Medications    Scheduled Meds:  albuterol  2.5 mg Nebulization Q6H   amLODipine  5 mg Oral Daily   aspirin EC  81 mg Oral Daily   carvedilol  6.25 mg Oral BID WC   docusate sodium  100 mg Oral BID   insulin aspart  0-15 Units Subcutaneous TID WC   insulin aspart  0-5 Units Subcutaneous QHS   insulin glargine-yfgn  15 Units Subcutaneous BID   isosorbide mononitrate  30 mg Oral Daily   leptospermum manuka honey  1 Application Topical Daily   levothyroxine  88 mcg Oral QHS   linezolid  600 mg Oral BID   rosuvastatin  20 mg Oral Daily   sodium chloride flush  3 mL Intravenous Q12H   sodium chloride flush  3 mL Intravenous Q12H   Continuous Infusions:  sodium chloride     sodium chloride 50 mL/hr at 06/21/22 0148   azithromycin 500 mg (06/20/22 1433)   ceFEPime (MAXIPIME) IV 2 g (06/21/22 8850)   heparin 1,450 Units/hr (06/20/22 2226)   promethazine (PHENERGAN) injection (IM or IVPB)     PRN Meds: sodium chloride, acetaminophen **OR** acetaminophen, bisacodyl, guaiFENesin, hydrALAZINE, morphine injection, ondansetron **OR** ondansetron (ZOFRAN) IV, oxyCODONE, polyethylene glycol, simethicone, sodium chloride flush   Vital Signs    Vitals:   06/20/22 2319 06/21/22 0000 06/21/22 0351 06/21/22 0735  BP:  (!) 118/44 (!) 129/50 (!) 122/38  Pulse:  68 68 67  Resp:  (!) 30 (!) 27 (!) 25  Temp:   98.5 F (36.9 C) 98.6 F (37 C)  TempSrc:   Oral Oral  SpO2: 95% 95% 93% 94%  Weight:      Height:        Intake/Output Summary (Last 24 hours) at 06/21/2022 0823 Last data filed at 06/21/2022 0400 Gross per 24 hour  Intake 745.49 ml  Output 1650 ml  Net -904.51 ml      06/19/2022    4:26 AM 06/02/2022    9:29 AM 06/01/2022    8:44 PM  Last 3 Weights  Weight (lbs) 187 lb 185 lb  180 lb  Weight (kg) 84.823 kg 83.915 kg 81.647 kg      Telemetry    Normal sinus rhythm- Personally Reviewed  ECG    Not performed today- Personally Reviewed  Physical Exam   GEN: No acute distress.   Neck: No JVD Cardiac: RRR, no murmurs, rubs, or gallops.  Respiratory: Clear to auscultation bilaterally. GI: Soft, nontender, non-distended  MS: No edema; No deformity. Neuro:  Nonfocal  Psych: Normal affect   Labs    High Sensitivity Troponin:   Recent Labs  Lab 06/19/22 0430 06/19/22 0641  TROPONINIHS 2,421* 2,139*     Chemistry Recent Labs  Lab 06/19/22 0430 06/19/22 0442 06/20/22 1500 06/20/22 2348 06/21/22 0642  NA 134*   < > 134* 134* 134*  K 3.7   < > 4.3 4.0 4.0  CL 102   < > 105 107 107  CO2 17*   < > 18* 18* 17*  GLUCOSE 180*   < > 286* 139* 130*  BUN 36*   < > 36* 36* 37*  CREATININE 1.45*   < > 1.23* 1.25* 1.29*  CALCIUM 8.1*   < > 8.0* 7.9* 8.0*  PROT  6.3*  --   --   --   --   ALBUMIN 2.5*  --   --   --   --   AST 22  --   --   --   --   ALT 12  --   --   --   --   ALKPHOS 111  --   --   --   --   BILITOT 1.7*  --   --   --   --   GFRNONAA 36*   < > 44* 44* 42*  ANIONGAP 15   < > '11 9 10   '$ < > = values in this interval not displayed.    Lipids No results for input(s): "CHOL", "TRIG", "HDL", "LABVLDL", "LDLCALC", "CHOLHDL" in the last 168 hours.  Hematology Recent Labs  Lab 06/20/22 0056 06/20/22 2348 06/21/22 0642  WBC 13.7* 12.1* 11.8*  RBC 2.92* 3.29* 3.29*  HGB 8.4* 9.5* 9.3*  HCT 26.3* 28.7* 28.9*  MCV 90.1 87.2 87.8  MCH 28.8 28.9 28.3  MCHC 31.9 33.1 32.2  RDW 14.6 14.6 14.7  PLT 371 319 295   Thyroid No results for input(s): "TSH", "FREET4" in the last 168 hours.  BNP Recent Labs  Lab 06/19/22 0921  BNP 1,579.3*    DDimer No results for input(s): "DDIMER" in the last 168 hours.   Radiology    DG Chest Port 1 View  Result Date: 06/20/2022 CLINICAL DATA:  Dyspnea, fever EXAM: PORTABLE CHEST 1 VIEW COMPARISON:   06/19/2022 FINDINGS: Stable cardiomediastinal contours. Progressive patchy airspace opacity within the right lower lobe. Mild streaky left basilar opacity. No pleural effusion or pneumothorax. IMPRESSION: Progressive patchy airspace opacity within the right lower lobe, concerning for pneumonia. Mild streaky left basilar opacity could reflect atelectasis or additional focus of infection. Electronically Signed   By: Davina Poke D.O.   On: 06/20/2022 16:35   ECHOCARDIOGRAM COMPLETE  Result Date: 06/19/2022    ECHOCARDIOGRAM REPORT   Patient Name:   LYNDIE VANDERLOOP Buehler Date of Exam: 06/19/2022 Medical Rec #:  [id number]       Height:       64.0 in Accession #:    [id number]      Weight:       187.0 lb Date of Birth:  04-07-1941        BSA:          1.901 m Patient Age:    81 years        BP:           106/44 mmHg Patient Gender: F               HR:           69 bpm. Exam Location:  Inpatient Procedure: 2D Echo, Cardiac Doppler, Color Doppler and Intracardiac            Opacification Agent Indications:    NSTEMI  History:        Patient has prior history of Echocardiogram examinations, most                 recent 11/09/2020. CAD, PAD, Arrythmias:Tachycardia; Risk                 Factors:Hypertension and Diabetes.  Sonographer:    Wenda Low Referring Phys: [id number] RHONDA G BARRETT IMPRESSIONS  1. No left ventricular thrombus is seen (Definity contrast was used). Wall motion suggests infarction due to occlusion of the mid-LAD artery or  a large ramus intermedius vessel that reaches the lateral apex. Left ventricular ejection fraction, by estimation, is 35 to 40%. The left ventricle has moderately decreased function. The left ventricle demonstrates regional wall motion abnormalities (see scoring diagram/findings for description). There is mild concentric left ventricular hypertrophy. Left  ventricular diastolic parameters are consistent with Grade II diastolic dysfunction (pseudonormalization). Elevated left atrial  pressure.  2. Right ventricular systolic function is normal. The right ventricular size is normal. There is normal pulmonary artery systolic pressure. The estimated right ventricular systolic pressure is 30.0 mmHg.  3. Left atrial size was severely dilated.  4. The mitral valve is degenerative. Mild to moderate mitral valve regurgitation. No evidence of mitral stenosis. The mean mitral valve gradient is 3.1 mmHg. Moderate mitral annular calcification.  5. Tricuspid valve regurgitation is mild to moderate.  6. The aortic valve is tricuspid. Aortic valve regurgitation is not visualized. No aortic stenosis is present. Comparison(s): Prior images unable to be directly viewed, comparison made by report only. The left ventricular function is significantly worse. The left ventricular wall motion abnormalities are new. FINDINGS  Left Ventricle: No left ventricular thrombus is seen (Definity contrast was used). Wall motion suggests infarction due to occlusion of the mid-LAD artery or a large ramus intermedius vessel that reaches the lateral apex. Left ventricular ejection fraction, by estimation, is 35 to 40%. The left ventricle has moderately decreased function. The left ventricle demonstrates regional wall motion abnormalities. Definity contrast agent was given IV to delineate the left ventricular endocardial borders. The left ventricular internal cavity size was normal in size. There is mild concentric left ventricular hypertrophy. Left ventricular diastolic parameters are consistent with Grade II diastolic dysfunction (pseudonormalization). Elevated left atrial pressure.  LV Wall Scoring: The apical lateral segment, mid anterolateral segment, apical anterior segment, and apex are akinetic. The anterior wall, entire septum, entire inferior wall, posterior wall, and basal anterolateral segment are normal. Right Ventricle: The right ventricular size is normal. No increase in right ventricular wall thickness. Right  ventricular systolic function is normal. There is normal pulmonary artery systolic pressure. The tricuspid regurgitant velocity is 2.81 m/s, and  with an assumed right atrial pressure of 3 mmHg, the estimated right ventricular systolic pressure is 92.3 mmHg. Left Atrium: Left atrial size was severely dilated. Right Atrium: Right atrial size was normal in size. Pericardium: There is no evidence of pericardial effusion. Mitral Valve: The mitral valve is degenerative in appearance. Moderate mitral annular calcification. Mild to moderate mitral valve regurgitation, with centrally-directed jet. No evidence of mitral valve stenosis. MV peak gradient, 10.0 mmHg. The mean mitral valve gradient is 3.1 mmHg. Tricuspid Valve: The tricuspid valve is normal in structure. Tricuspid valve regurgitation is mild to moderate. Aortic Valve: The aortic valve is tricuspid. Aortic valve regurgitation is not visualized. No aortic stenosis is present. Aortic valve mean gradient measures 4.0 mmHg. Aortic valve peak gradient measures 9.0 mmHg. Aortic valve area, by VTI measures 1.90 cm. Pulmonic Valve: The pulmonic valve was normal in structure. Pulmonic valve regurgitation is not visualized. Aorta: The aortic root and ascending aorta are structurally normal, with no evidence of dilitation. IAS/Shunts: No atrial level shunt detected by color flow Doppler.  LEFT VENTRICLE PLAX 2D LVIDd:         5.40 cm     Diastology LVIDs:         3.30 cm     LV e' medial:    5.33 cm/s LV PW:  1.20 cm     LV E/e' medial:  26.6 LV IVS:        0.85 cm     LV e' lateral:   8.16 cm/s LVOT diam:     1.80 cm     LV E/e' lateral: 17.4 LV SV:         68 LV SV Index:   36 LVOT Area:     2.54 cm  LV Volumes (MOD) LV vol d, MOD A2C: 64.4 ml LV vol d, MOD A4C: 74.6 ml LV vol s, MOD A2C: 31.6 ml LV vol s, MOD A4C: 36.4 ml LV SV MOD A2C:     32.8 ml LV SV MOD A4C:     74.6 ml LV SV MOD BP:      35.9 ml RIGHT VENTRICLE RV Basal diam:  2.80 cm RV Mid diam:    2.50  cm RV S prime:     10.60 cm/s TAPSE (M-mode): 3.0 cm LEFT ATRIUM             Index        RIGHT ATRIUM           Index LA diam:        5.00 cm 2.63 cm/m   RA Area:     14.20 cm LA Vol (A2C):   66.8 ml 35.13 ml/m  RA Volume:   34.90 ml  18.36 ml/m LA Vol (A4C):   67.3 ml 35.40 ml/m LA Biplane Vol: 67.8 ml 35.66 ml/m  AORTIC VALVE                    PULMONIC VALVE AV Area (Vmax):    1.67 cm     PV Vmax:       0.85 m/s AV Area (Vmean):   1.87 cm     PV Peak grad:  2.9 mmHg AV Area (VTI):     1.90 cm AV Vmax:           150.00 cm/s AV Vmean:          93.200 cm/s AV VTI:            0.357 m AV Peak Grad:      9.0 mmHg AV Mean Grad:      4.0 mmHg LVOT Vmax:         98.30 cm/s LVOT Vmean:        68.400 cm/s LVOT VTI:          0.266 m LVOT/AV VTI ratio: 0.75  AORTA Ao Root diam: 2.80 cm MITRAL VALVE                TRICUSPID VALVE MV Area (PHT): 3.06 cm     TR Peak grad:   31.6 mmHg MV Area VTI:   1.65 cm     TR Vmax:        281.00 cm/s MV Peak grad:  10.0 mmHg MV Mean grad:  3.1 mmHg     SHUNTS MV Vmax:       1.58 m/s     Systemic VTI:  0.27 m MV Vmean:      72.3 cm/s    Systemic Diam: 1.80 cm MV Decel Time: 248 msec MV E velocity: 142.00 cm/s MV A velocity: 74.20 cm/s MV E/A ratio:  1.91 Mihai Croitoru MD Electronically signed by Sanda Klein MD Signature Date/Time: 06/19/2022/11:24:42 AM    Final     Cardiac Studies   2D echocardiogram (06/19/2022)  IMPRESSIONS  1. No left ventricular thrombus is seen (Definity contrast was used).  Wall motion suggests infarction due to occlusion of the mid-LAD artery or  a large ramus intermedius vessel that reaches the lateral apex. Left  ventricular ejection fraction, by  estimation, is 35 to 40%. The left ventricle has moderately decreased  function. The left ventricle demonstrates regional wall motion  abnormalities (see scoring diagram/findings for description). There is  mild concentric left ventricular hypertrophy. Left   ventricular diastolic parameters  are consistent with Grade II diastolic  dysfunction (pseudonormalization). Elevated left atrial pressure.   2. Right ventricular systolic function is normal. The right ventricular  size is normal. There is normal pulmonary artery systolic pressure. The  estimated right ventricular systolic pressure is 09.3 mmHg.   3. Left atrial size was severely dilated.   4. The mitral valve is degenerative. Mild to moderate mitral valve  regurgitation. No evidence of mitral stenosis. The mean mitral valve  gradient is 3.1 mmHg. Moderate mitral annular calcification.   5. Tricuspid valve regurgitation is mild to moderate.   6. The aortic valve is tricuspid. Aortic valve regurgitation is not  visualized. No aortic stenosis is present.  Patient Profile   JANECIA PALAU is a 81 y.o. female with a hx of LE fx and repair (d/c 07/25), DES LAD 2014 w/ med rx for RCA/PDA dz, DM2, HTN, HLD, SVT, depression, diverticulosis, who is being seen 06/19/2022 for the evaluation of NSTEMI at the request of Dr Sherry Ruffing.   Assessment & Plan    1: Non-STEMI-troponins rose to 2400.  EKG showed anterolateral T wave inversion.  2D echo shows decline in EF of 35 to 40% with wall motion abnormality suggesting LAD disease.  This is in the distribution of the prior stent.  She is on IV heparin.  She has had no chest pain.  Will plan diagnostic coronary angiography tomorrow.  2: Hospital-acquired pneumonia-seen on chest CTA.  On antibiotics per primary care team.  3: Anemia-hemoglobin 8.4 down from 9.7.  Patient had transfusion of packed red blood cells.  Her hemoglobin is back up to 9.3 this morning.  Given patient's EKG changes and 2D echo findings she will need diagnostic coronary angiography.  Patient is scheduled for today.  According to the cath note performed Dr. Burt Knack 09/03/2013, he was unable to get up right radial despite vasodilators.  Patient should probably be cath via the femoral approach but I will leave this up to Dr.  Tamala Julian..       For questions or updates, please contact [place] Please consult [email or web address] for contact info under        Signed, Quay Burow, MD  06/21/2022, 8:23 AM

## 2022-06-21 NOTE — Progress Notes (Signed)
[place] for Heparin Indication: chest pain/ACS Brief A/P: Heparin level within goal range Continue Heparin at current rate   Allergies  Allergen Reactions   Penicillins     Diffuse joint pain @ age 81 in context of Scarlet Fever  Did it involve swelling of the face/tongue/throat, SOB, or low BP? No Did it involve sudden or severe rash/hives, skin peeling, or any reaction on the inside of your mouth or nose? No Did you need to seek medical attention at a hospital or doctor's office? No When did it last happen?  childhood     If all above answers are "NO", may proceed with cephalosporin use.      Patient Measurements: Height: '5\' 4"'$  (162.6 cm) Weight: 84.8 kg (187 lb) IBW/kg (Calculated) : 54.7 Heparin Dosing Weight: 75 kg  Vital Signs: Temp: 98.3 F (36.8 C) (08/09 2300) Temp Source: Oral (08/09 2300) BP: 118/44 (08/10 0000) Pulse Rate: 68 (08/10 0000)  Labs: Recent Labs    06/19/22 0430 06/19/22 0442 06/19/22 0641 06/19/22 1512 06/20/22 0056 06/20/22 0536 06/20/22 1500 06/20/22 2348 06/21/22 0100  HGB 9.7* 9.9*  --   --  8.4*  --   --  9.5*  --   HCT 31.1* 29.0*  --   --  26.3*  --   --  28.7*  --   PLT 413*  --   --   --  371  --   --  319  --   APTT 33  --   --   --   --   --   --   --   --   LABPROT 15.5*  --   --   --   --   --   --   --   --   INR 1.2  --   --   --   --   --   --   --   --   HEPARINUNFRC  --   --   --    < > 0.24* 0.28* 0.22*  --  0.37  CREATININE 1.45* 1.30*  --   --  1.29*  --  1.23* 1.25*  --   TROPONINIHS 2,421*  --  2,139*  --   --   --   --   --   --    < > = values in this interval not displayed.     Estimated Creatinine Clearance: 37.8 mL/min (A) (by C-G formula based on SCr of 1.25 mg/dL (H)).  Assessment: 81 y.o. female with NSTEMI for heparin  Goal of Therapy:  Heparin level 0.3-0.7 units/ml Monitor platelets by anticoagulation protocol: Yes   Plan:  Continue Heparin at current  rate   Phillis Knack, PharmD, BCPS 06/21/2022 2:26 AM

## 2022-06-21 NOTE — Progress Notes (Signed)
PROGRESS NOTE    Danielle Murray  KZL:[id number] DOB: [birth year] DOA: 06/19/2022 PCP: [name], MD    Chief Complaint  Patient presents with   Fever    Brief Narrative:   Danielle Murray is a 81 y.o. female with medical history significant of CAD s/p stent; HTN; HLD; hypothyroidism; and DM presenting with fever. She was last admitted from 7/21-25 for hip fracture s/p repair.  She has been doing well with her rehab but became SOB a day or two ago.  She has had SOB periodically prior to last hospitalization, would have to sit down and rest with exertion.  Worse in the last 2 days significantly.  She has had mild pain in the back of her shoulder blades and pressure in her substernal region, "not real bad though."  She developed fever in the last 2 days, max 102.  No cough.  She was wheezing early this AM.  No urinary symptoms.   - her work up significant NSTEMI, and pneumonia, she is admitted for further workup.  Assessment & Plan:   Principal Problem:   NSTEMI (non-ST elevated myocardial infarction) ([place]) Active Problems:   Hypothyroidism   Essential hypertension   Uncontrolled type 2 diabetes mellitus with hyperglycemia, with long-term current use of insulin (HCC)   CAD (coronary artery disease), native coronary artery   Dyslipidemia   Status post surgery   Pressure injury of skin  Pneumonia Acute hypoxemic respiratory failure -Patient presenting with cough, fever to 102, mildly decreased oxygen saturation, and infiltrate in right lower lobe on chest x-ray -This appears to be most likely community-acquired pneumonia  -Influenza negative. -COVID-19 negative. -She was tachypneic, repeat x-ray yesterday confirmed bibasilar pneumonia broadened her antibiotic to hospital-acquired pneumonia, she is currently on vancomycin and cefepime -She was encouraged use incentive spirometry and flutter valve. -This morning she is on 5 to 7 L nasal cannula  Non- stemi -Troponin significantly  elevated, peaked at 2400, and EKG with acute changes, 2D echo shows decline in her EF 35 to 40%. -Continue with heparin gtt.. -Treatment Per cardiology, plan for cardiac cath today.  Acute on chronic blood loss anemia -Received 1 unit PRBC 8/9, especially in the setting of symptomatic anemia and MRI, hemoglobin stable this morning at 9.5  HTN -Continue carvedilol, amlodipine -on PRN hydralazine    HLD -Continue Crestor -Check lipids   DM -A1c is 7.5, suboptimal control -Hold Jardiance -Switch 70/30 to sem-glee for now -Will cover with moderate-scale SSI for now   Hypothyroidism -Continue Synthroid   Recent hip fracture -Appears to be doing well from this standpoint -She is likely to need to return to rehab following this hospitalization     Obesity Body mass index is 32.1 kg/m.  Pressure ulcer -Present on admission -Consulted wound care  Pressure Injury 06/19/22 Buttocks Left Stage 2 -  Partial thickness loss of dermis presenting as a shallow open injury with a red, pink wound bed without slough. (Active)  06/19/22 1202  Location: Buttocks  Location Orientation: Left  Staging: Stage 2 -  Partial thickness loss of dermis presenting as a shallow open injury with a red, pink wound bed without slough.  Wound Description (Comments):   Present on Admission: Yes       DVT prophylaxis: Heparin GTT Code Status: Full Family Communication: D/W husband at bedside Disposition:   Status is: Inpatient    Consultants:  CArdiology  Subjective:  Reports dyspnea has improved, denies any chest pain overnight  Objective: Vitals:  06/21/22 0000 06/21/22 0351 06/21/22 0735 06/21/22 0827  BP: (!) 118/44 (!) 129/50 (!) 122/48   Pulse: 68 68 67   Resp: (!) 30 (!) 27 (!) 25   Temp:  98.5 F (36.9 C) 98.6 F (37 C)   TempSrc:  Oral Oral   SpO2: 95% 93% 94% 97%  Weight:      Height:        Intake/Output Summary (Last 24 hours) at 06/21/2022 0941 Last data filed at  06/21/2022 0400 Gross per 24 hour  Intake 745.49 ml  Output 1650 ml  Net -904.51 ml   Filed Weights   06/19/22 0426  Weight: 84.8 kg    Examination:  Awake Alert, Oriented X 3, No new F.N deficits, Normal affect Symmetrical Chest wall movement, improved air entry at the bases RRR,No Gallops,Rubs or new Murmurs, No Parasternal Heave +ve B.Sounds, Abd Soft, No tenderness, No rebound - guarding or rigidity. No Cyanosis, Clubbing or edema, No new Rash or bruise       Data Reviewed: I have personally reviewed following labs and imaging studies  CBC: Recent Labs  Lab 06/19/22 0430 06/19/22 0442 06/20/22 0056 06/20/22 2348 06/21/22 0642  WBC 12.9*  --  13.7* 12.1* 11.8*  NEUTROABS 12.0*  --   --   --   --   HGB 9.7* 9.9* 8.4* 9.5* 9.3*  HCT 31.1* 29.0* 26.3* 28.7* 28.9*  MCV 90.9  --  90.1 87.2 87.8  PLT 413*  --  371 319 706    Basic Metabolic Panel: Recent Labs  Lab 06/19/22 0430 06/19/22 0442 06/20/22 0056 06/20/22 1500 06/20/22 2348 06/21/22 0642  NA 134* 134* 135 134* 134* 134*  K 3.7 3.7 3.4* 4.3 4.0 4.0  CL 102 102 108 105 107 107  CO2 17*  --  17* 18* 18* 17*  GLUCOSE 180* 178* 117* 286* 139* 130*  BUN 36* 32* 36* 36* 36* 37*  CREATININE 1.45* 1.30* 1.29* 1.23* 1.25* 1.29*  CALCIUM 8.1*  --  7.8* 8.0* 7.9* 8.0*    GFR: Estimated Creatinine Clearance: 36.6 mL/min (A) (by C-G formula based on SCr of 1.29 mg/dL (H)).  Liver Function Tests: Recent Labs  Lab 06/19/22 0430  AST 22  ALT 12  ALKPHOS 111  BILITOT 1.7*  PROT 6.3*  ALBUMIN 2.5*    CBG: Recent Labs  Lab 06/20/22 1103 06/20/22 1547 06/20/22 2124 06/21/22 0610 06/21/22 0733  GLUCAP 171* 336* 154* 135* 125*     Recent Results (from the past 240 hour(s))  Resp Panel by RT-PCR (Flu A&B, Covid) Peripheral     Status: None   Collection Time: 06/19/22  4:28 AM   Specimen: Peripheral; Nasal Swab  Result Value Ref Range Status   SARS Coronavirus 2 by RT PCR NEGATIVE NEGATIVE Final     Comment: (NOTE) SARS-CoV-2 target nucleic acids are NOT DETECTED.  The SARS-CoV-2 RNA is generally detectable in upper respiratory specimens during the acute phase of infection. The lowest concentration of SARS-CoV-2 viral copies this assay can detect is 138 copies/mL. A negative result does not preclude SARS-Cov-2 infection and should not be used as the sole basis for treatment or other patient management decisions. A negative result may occur with  improper specimen collection/handling, submission of specimen other than nasopharyngeal swab, presence of viral mutation(s) within the areas targeted by this assay, and inadequate number of viral copies(<138 copies/mL). A negative result must be combined with clinical observations, patient history, and epidemiological information. The expected result is  Negative.  Fact Sheet for Patients:  EntrepreneurPulse.com.au  Fact Sheet for Healthcare Providers:  IncredibleEmployment.be  This test is no t yet approved or cleared by the [place] FDA and  has been authorized for detection and/or diagnosis of SARS-CoV-2 by FDA under an Emergency Use Authorization (EUA). This EUA will remain  in effect (meaning this test can be used) for the duration of the COVID-19 declaration under Section 564(b)(1) of the Act, 21 U.S.C.section 360bbb-3(b)(1), unless the authorization is terminated  or revoked sooner.       Influenza A by PCR NEGATIVE NEGATIVE Final   Influenza B by PCR NEGATIVE NEGATIVE Final    Comment: (NOTE) The Xpert Xpress SARS-CoV-2/FLU/RSV plus assay is intended as an aid in the diagnosis of influenza from Nasopharyngeal swab specimens and should not be used as a sole basis for treatment. Nasal washings and aspirates are unacceptable for Xpert Xpress SARS-CoV-2/FLU/RSV testing.  Fact Sheet for Patients: EntrepreneurPulse.com.au  Fact Sheet for Healthcare  Providers: IncredibleEmployment.be  This test is not yet approved or cleared by the [place] FDA and has been authorized for detection and/or diagnosis of SARS-CoV-2 by FDA under an Emergency Use Authorization (EUA). This EUA will remain in effect (meaning this test can be used) for the duration of the COVID-19 declaration under Section 564(b)(1) of the Act, 21 U.S.C. section 360bbb-3(b)(1), unless the authorization is terminated or revoked.  Performed at [facility], [place] [address], [place], [place] [address]   Blood Culture (routine x 2)     Status: None (Preliminary result)   Collection Time: 06/19/22  4:30 AM   Specimen: BLOOD RIGHT WRIST  Result Value Ref Range Status   Specimen Description BLOOD RIGHT WRIST  Final   Special Requests   Final    BOTTLES DRAWN AEROBIC AND ANAEROBIC Blood Culture adequate volume   Culture   Final    NO GROWTH 2 DAYS Performed at [facility], [place] [address], [place], [place] [address]    Report Status PENDING  Incomplete  Blood Culture (routine x 2)     Status: None (Preliminary result)   Collection Time: 06/19/22  4:36 AM   Specimen: BLOOD LEFT HAND  Result Value Ref Range Status   Specimen Description BLOOD LEFT HAND  Final   Special Requests   Final    BOTTLES DRAWN AEROBIC AND ANAEROBIC Blood Culture results may not be optimal due to an inadequate volume of blood received in culture bottles   Culture   Final    NO GROWTH 2 DAYS Performed at [facility], [place] [address], [place], [place] [address]    Report Status PENDING  Incomplete  Urine Culture     Status: Abnormal   Collection Time: 06/19/22  4:54 AM   Specimen: In/Out Cath Urine  Result Value Ref Range Status   Specimen Description IN/OUT CATH URINE  Final   Special Requests   Final    NONE Performed at [facility], [place] [address], [place], [place] [address]    Culture >=100,000 COLONIES/mL ESCHERICHIA COLI (A)  Final   Report  Status 06/21/2022 FINAL  Final   Organism ID, Bacteria ESCHERICHIA COLI (A)  Final      Susceptibility   Escherichia coli - MIC*    AMPICILLIN <=2 SENSITIVE Sensitive     CEFAZOLIN <=4 SENSITIVE Sensitive     CEFEPIME <=0.12 SENSITIVE Sensitive     CEFTRIAXONE <=0.25 SENSITIVE Sensitive     CIPROFLOXACIN <=  0.25 SENSITIVE Sensitive     GENTAMICIN <=1 SENSITIVE Sensitive     IMIPENEM <=0.25 SENSITIVE Sensitive     NITROFURANTOIN <=16 SENSITIVE Sensitive     TRIMETH/SULFA <=20 SENSITIVE Sensitive     AMPICILLIN/SULBACTAM <=2 SENSITIVE Sensitive     PIP/TAZO <=4 SENSITIVE Sensitive     * >=100,000 COLONIES/mL ESCHERICHIA COLI  MRSA Next Gen by PCR, Nasal     Status: None   Collection Time: 06/20/22  3:58 PM   Specimen: Nasal Mucosa; Nasal Swab  Result Value Ref Range Status   MRSA by PCR Next Gen NOT DETECTED NOT DETECTED Final    Comment: (NOTE) The GeneXpert MRSA Assay (FDA approved for NASAL specimens only), is one component of a comprehensive MRSA colonization surveillance program. It is not intended to diagnose MRSA infection nor to guide or monitor treatment for MRSA infections. Test performance is not FDA approved in patients less than 22 years old. Performed at [facility], [place] [address] [place], [place] [address]          Radiology Studies: DG Chest Port 1 View  Result Date: 06/20/2022 CLINICAL DATA:  Dyspnea, fever EXAM: PORTABLE CHEST 1 VIEW COMPARISON:  06/19/2022 FINDINGS: Stable cardiomediastinal contours. Progressive patchy airspace opacity within the right lower lobe. Mild streaky left basilar opacity. No pleural effusion or pneumothorax. IMPRESSION: Progressive patchy airspace opacity within the right lower lobe, concerning for pneumonia. Mild streaky left basilar opacity could reflect atelectasis or additional focus of infection. Electronically Signed   By: Davina Poke D.O.   On: 06/20/2022 16:35        Scheduled Meds:  albuterol  2.5 mg  Nebulization Q6H   amLODipine  5 mg Oral Daily   aspirin EC  81 mg Oral Daily   carvedilol  6.25 mg Oral BID WC   docusate sodium  100 mg Oral BID   insulin aspart  0-15 Units Subcutaneous TID WC   insulin aspart  0-5 Units Subcutaneous QHS   insulin glargine-yfgn  15 Units Subcutaneous BID   isosorbide mononitrate  30 mg Oral Daily   leptospermum manuka honey  1 Application Topical Daily   levothyroxine  88 mcg Oral QHS   linezolid  600 mg Oral BID   rosuvastatin  20 mg Oral Daily   sodium chloride flush  3 mL Intravenous Q12H   sodium chloride flush  3 mL Intravenous Q12H   Continuous Infusions:  sodium chloride     sodium chloride 50 mL/hr at 06/21/22 0148   azithromycin 500 mg (06/20/22 1433)   ceFEPime (MAXIPIME) IV 2 g (06/21/22 5643)   heparin 1,450 Units/hr (06/20/22 2226)   promethazine (PHENERGAN) injection (IM or IVPB)       LOS: 2 days      Phillips Climes, MD [facility]   To contact the attending provider between 7A-7P or the covering provider during after hours 7P-7A, please log into the web site [email or web address] and access using universal [place] password for that web site. If you do not have the password, please call the hospital operator.  06/21/2022, 9:41 AM

## 2022-06-22 ENCOUNTER — Encounter (HOSPITAL_COMMUNITY): Payer: Self-pay | Admitting: Interventional Cardiology

## 2022-06-22 DIAGNOSIS — I340 Nonrheumatic mitral (valve) insufficiency: Secondary | ICD-10-CM | POA: Diagnosis not present

## 2022-06-22 DIAGNOSIS — I5043 Acute on chronic combined systolic (congestive) and diastolic (congestive) heart failure: Secondary | ICD-10-CM

## 2022-06-22 DIAGNOSIS — I519 Heart disease, unspecified: Secondary | ICD-10-CM | POA: Diagnosis not present

## 2022-06-22 DIAGNOSIS — I214 Non-ST elevation (NSTEMI) myocardial infarction: Secondary | ICD-10-CM | POA: Diagnosis not present

## 2022-06-22 DIAGNOSIS — I2510 Atherosclerotic heart disease of native coronary artery without angina pectoris: Secondary | ICD-10-CM | POA: Diagnosis not present

## 2022-06-22 LAB — GLUCOSE, CAPILLARY
Glucose-Capillary: 121 mg/dL — ABNORMAL HIGH (ref 70–99)
Glucose-Capillary: 157 mg/dL — ABNORMAL HIGH (ref 70–99)
Glucose-Capillary: 164 mg/dL — ABNORMAL HIGH (ref 70–99)

## 2022-06-22 LAB — BASIC METABOLIC PANEL
Anion gap: 9 (ref 5–15)
BUN: 38 mg/dL — ABNORMAL HIGH (ref 8–23)
CO2: 16 mmol/L — ABNORMAL LOW (ref 22–32)
Calcium: 7.9 mg/dL — ABNORMAL LOW (ref 8.9–10.3)
Chloride: 109 mmol/L (ref 98–111)
Creatinine, Ser: 1.27 mg/dL — ABNORMAL HIGH (ref 0.44–1.00)
GFR, Estimated: 43 mL/min — ABNORMAL LOW (ref >60)
Glucose, Bld: 130 mg/dL — ABNORMAL HIGH (ref 70–99)
Potassium: 4.2 mmol/L (ref 3.5–5.1)
Sodium: 134 mmol/L — ABNORMAL LOW (ref 135–145)

## 2022-06-22 LAB — CBC
HCT: 29 % — ABNORMAL LOW (ref 36.0–46.0)
Hemoglobin: 9 g/dL — ABNORMAL LOW (ref 12.0–15.0)
MCH: 27.7 pg (ref 26.0–34.0)
MCHC: 31 g/dL (ref 30.0–36.0)
MCV: 89.2 fL (ref 80.0–100.0)
Platelets: 298 10*3/uL (ref 150–400)
RBC: 3.25 MIL/uL — ABNORMAL LOW (ref 3.87–5.11)
RDW: 14.7 % (ref 11.5–15.5)
WBC: 9.8 10*3/uL (ref 4.0–10.5)
nRBC: 0 % (ref 0.0–0.2)

## 2022-06-22 LAB — PROCALCITONIN: Procalcitonin: 16.11 ng/mL

## 2022-06-22 MED ORDER — FUROSEMIDE 10 MG/ML IJ SOLN
40.0000 mg | Freq: Two times a day (BID) | INTRAMUSCULAR | Status: DC
  Administered 2022-06-22: 40 mg via INTRAVENOUS
  Filled 2022-06-22: qty 4

## 2022-06-22 MED ORDER — FUROSEMIDE 10 MG/ML IJ SOLN
20.0000 mg | Freq: Every day | INTRAMUSCULAR | Status: DC
  Administered 2022-06-22: 20 mg via INTRAVENOUS
  Filled 2022-06-22: qty 2

## 2022-06-22 MED ORDER — ORAL CARE MOUTH RINSE: 15.0000 mL | OROMUCOSAL | Status: DC | PRN

## 2022-06-22 MED ORDER — FUROSEMIDE 10 MG/ML IJ SOLN
20.0000 mg | Freq: Once | INTRAMUSCULAR | Status: DC
Start: 2022-06-22 — End: 2022-06-22

## 2022-06-22 MED ORDER — FUROSEMIDE 10 MG/ML IJ SOLN
20.0000 mg | Freq: Once | INTRAMUSCULAR | Status: AC
  Administered 2022-06-22: 20 mg via INTRAVENOUS
  Filled 2022-06-22: qty 2

## 2022-06-22 MED ORDER — SPIRONOLACTONE 12.5 MG HALF TABLET
12.5000 mg | ORAL_TABLET | Freq: Every day | ORAL | Status: DC
  Administered 2022-06-22 – 2022-06-28 (×7): 12.5 mg via ORAL
  Filled 2022-06-22 (×7): qty 1

## 2022-06-22 NOTE — Plan of Care (Signed)
  Problem: Education: Goal: Understanding of CV disease, CV risk reduction, and recovery process will improve Outcome: Progressing Goal: Individualized Educational Video(s) Outcome: Progressing   Problem: Activity: Goal: Ability to return to baseline activity level will improve Outcome: Progressing   Problem: Cardiovascular: Goal: Ability to achieve and maintain adequate cardiovascular perfusion will improve Outcome: Progressing Goal: Vascular access site(s) Level 0-1 will be maintained Outcome: Progressing   Problem: Health Behavior/Discharge Planning: Goal: Ability to safely manage health-related needs after discharge will improve Outcome: Progressing   Problem: Activity: Goal: Ability to tolerate increased activity will improve Outcome: Progressing   Problem: Clinical Measurements: Goal: Ability to maintain a body temperature in the normal range will improve Outcome: Progressing   Problem: Respiratory: Goal: Ability to maintain adequate ventilation will improve Outcome: Progressing Goal: Ability to maintain a clear airway will improve Outcome: Progressing   Problem: Education: Goal: Knowledge of General Education information will improve Description: Including pain rating scale, medication(s)/side effects and non-pharmacologic comfort measures Outcome: Progressing   Problem: Health Behavior/Discharge Planning: Goal: Ability to manage health-related needs will improve Outcome: Progressing   Problem: Clinical Measurements: Goal: Ability to maintain clinical measurements within normal limits will improve Outcome: Progressing Goal: Will remain free from infection Outcome: Progressing Goal: Diagnostic test results will improve Outcome: Progressing Goal: Respiratory complications will improve Outcome: Progressing Goal: Cardiovascular complication will be avoided Outcome: Progressing   Problem: Activity: Goal: Risk for activity intolerance will decrease Outcome:  Progressing   Problem: Nutrition: Goal: Adequate nutrition will be maintained Outcome: Progressing   Problem: Coping: Goal: Level of anxiety will decrease Outcome: Progressing   Problem: Elimination: Goal: Will not experience complications related to bowel motility Outcome: Progressing Goal: Will not experience complications related to urinary retention Outcome: Progressing   Problem: Pain Managment: Goal: General experience of comfort will improve Outcome: Progressing   Problem: Safety: Goal: Ability to remain free from injury will improve Outcome: Progressing   Problem: Skin Integrity: Goal: Risk for impaired skin integrity will decrease Outcome: Progressing

## 2022-06-22 NOTE — Progress Notes (Signed)
Sacral wound care per order, cleanse, medihoney and foam pad.

## 2022-06-22 NOTE — Progress Notes (Signed)
Patient ask that I change wound dressing later, patient states that "I am in a comfortable spot and dont want to be moved".   RN will try again later.

## 2022-06-22 NOTE — Progress Notes (Addendum)
PROGRESS NOTE    Danielle Murray  XVQ:[id number] DOB: [birth year] DOA: 06/19/2022 PCP: [name], MD    Chief Complaint  Patient presents with   Fever    Brief Narrative:   Danielle Murray is a 81 y.o. female with medical history significant of CAD s/p stent; HTN; HLD; hypothyroidism; and DM presenting with fever. She was last admitted from 7/21-25 for hip fracture s/p repair.  She has been doing well with her rehab but became SOB a day or two ago.  She has had SOB periodically prior to last hospitalization, would have to sit down and rest with exertion.  Worse in the last 2 days significantly.  She has had mild pain in the back of her shoulder blades and pressure in her substernal region, "not real bad though."  She developed fever in the last 2 days, max 102.  No cough.  She was wheezing early this AM.  No urinary symptoms.   - her work up significant NSTEMI, and pneumonia, she is admitted for further workup.  Assessment & Plan:   Principal Problem:   NSTEMI (non-ST elevated myocardial infarction) ([place]) Active Problems:   Hypothyroidism   Essential hypertension   Uncontrolled type 2 diabetes mellitus with hyperglycemia, with long-term current use of insulin (HCC)   CAD (coronary artery disease), native coronary artery   Dyslipidemia   Status post surgery   Pressure injury of skin   Acute on chronic combined systolic and diastolic HF (heart failure) (HCC)  Pneumonia Acute hypoxemic respiratory failure -Patient presenting with cough, fever to 102, mildly decreased oxygen saturation, and infiltrate in right lower lobe on chest x-ray -This appears to be most likely due to hospital acquired pneumonia  -Influenza negative. -COVID-19 negative. -Presents with fever of 102, procalcitonin significantly elevated at 26, trending down which is reassuring -Remains with significant dyspnea, broadened antibiotic coverage to vancomycin, azithromycin and cefepime, MRSA PCR negative, continued  vancomycin -Encouraged use incentive spirometry and flutter valve -She started on intermittent BiPAP support -PCCM input greatly appreciated, main concern of volume overload  Acute systolic CHF -Continue with IV diuresis  Non- stemi -Troponin significantly elevated, peaked at 2400, and EKG with acute changes, 2D echo shows decline in her EF 35 to 40%. -On heparin gtt. initially -Treatment Per cardiology, cardiac cath on 8/10 significant for moderate three-vessel coronary artery disease, recommendation for medical management -Continue with Coreg, aspirin, Crestor and Imdur  Acute on chronic blood loss anemia -Received 1 unit PRBC 8/9, especially in the setting of symptomatic anemia and MRI, hemoglobin stable this morning at 9.5  HTN -Continue carvedilol, amlodipine -on PRN hydralazine    HLD -Continue Crestor -Check lipids   DM -A1c is 7.5, suboptimal control -Hold Jardiance -Switch 70/30 to sem-glee for now -Will cover with moderate-scale SSI for now   Hypothyroidism -Continue Synthroid   Recent hip fracture -Appears to be doing well from this standpoint -She is likely to need to return to rehab following this hospitalization     Obesity Body mass index is 32.1 kg/m.  Pressure ulcer -Present on admission -Consulted wound care  Pressure Injury 06/19/22 Buttocks Left Stage 2 -  Partial thickness loss of dermis presenting as a shallow open injury with a red, pink wound bed without slough. (Active)  06/19/22 1202  Location: Buttocks  Location Orientation: Left  Staging: Stage 2 -  Partial thickness loss of dermis presenting as a shallow open injury with a red, pink wound bed without slough.  Wound Description (Comments):  Present on Admission: Yes       DVT prophylaxis: Heparin GTT>> [place] heparin Code Status: Full Family Communication: D/W husband at bedside Disposition:   Status is: Inpatient    Consultants:  Cardiology PCCM  Subjective:  Reports  dyspnea has improved, denies any chest pain overnight  Objective: Vitals:   06/22/22 0330 06/22/22 0800 06/22/22 0815 06/22/22 1200  BP: (!) 128/43 139/61  (!) 139/53  Pulse: 63 64  66  Resp: (!) 24 (!) 24  (!) 24  Temp: 98.3 F (36.8 C) 98.5 F (36.9 C)    TempSrc: Oral Oral    SpO2: 91% 96% 95% 95%  Weight:      Height:        Intake/Output Summary (Last 24 hours) at 06/22/2022 1323 Last data filed at 06/22/2022 0800 Gross per 24 hour  Intake 643.09 ml  Output 1700 ml  Net -1056.91 ml   Filed Weights   06/19/22 0426  Weight: 84.8 kg    Examination:  Awake Alert, Oriented X 3, ill-appearing Symmetrical Chest wall movement, few crackles at the bases, tachypneic with some use of accessory muscles RRR,No Gallops,Rubs or new Murmurs, No Parasternal Heave +ve B.Sounds, Abd Soft, No tenderness, No rebound - guarding or rigidity. No Cyanosis, Clubbing or edema, No new Rash or bruise        Data Reviewed: I have personally reviewed following labs and imaging studies  CBC: Recent Labs  Lab 06/19/22 0430 06/19/22 0442 06/20/22 0056 06/20/22 2348 06/21/22 0642 06/21/22 1526 06/21/22 1531 06/22/22 0049  WBC 12.9*  --  13.7* 12.1* 11.8*  --   --  9.8  NEUTROABS 12.0*  --   --   --   --   --   --   --   HGB 9.7*   < > 8.4* 9.5* 9.3* 10.2* 10.2* 9.0*  HCT 31.1*   < > 26.3* 28.7* 28.9* 30.0* 30.0* 29.0*  MCV 90.9  --  90.1 87.2 87.8  --   --  89.2  PLT 413*  --  371 319 295  --   --  298   < > = values in this interval not displayed.    Basic Metabolic Panel: Recent Labs  Lab 06/20/22 0056 06/20/22 1500 06/20/22 2348 06/21/22 0642 06/21/22 1526 06/21/22 1531 06/22/22 0049  NA 135 134* 134* 134* 137 129* 134*  K 3.4* 4.3 4.0 4.0 4.2 4.0 4.2  CL 108 105 107 107  --   --  109  CO2 17* 18* 18* 17*  --   --  16*  GLUCOSE 117* 286* 139* 130*  --   --  130*  BUN 36* 36* 36* 37*  --   --  38*  CREATININE 1.29* 1.23* 1.25* 1.29*  --   --  1.27*  CALCIUM 7.8* 8.0*  7.9* 8.0*  --   --  7.9*    GFR: Estimated Creatinine Clearance: 37.2 mL/min (A) (by C-G formula based on SCr of 1.27 mg/dL (H)).  Liver Function Tests: Recent Labs  Lab 06/19/22 0430  AST 22  ALT 12  ALKPHOS 111  BILITOT 1.7*  PROT 6.3*  ALBUMIN 2.5*    CBG: Recent Labs  Lab 06/21/22 1116 06/21/22 1604 06/21/22 1918 06/22/22 0605 06/22/22 1128  GLUCAP 117* 122* 128* 121* 157*     Recent Results (from the past 240 hour(s))  Resp Panel by RT-PCR (Flu A&B, Covid) Peripheral     Status: None   Collection Time: 06/19/22  4:28 AM   Specimen: Peripheral; Nasal Swab  Result Value Ref Range Status   SARS Coronavirus 2 by RT PCR NEGATIVE NEGATIVE Final    Comment: (NOTE) SARS-CoV-2 target nucleic acids are NOT DETECTED.  The SARS-CoV-2 RNA is generally detectable in upper respiratory specimens during the acute phase of infection. The lowest concentration of SARS-CoV-2 viral copies this assay can detect is 138 copies/mL. A negative result does not preclude SARS-Cov-2 infection and should not be used as the sole basis for treatment or other patient management decisions. A negative result may occur with  improper specimen collection/handling, submission of specimen other than nasopharyngeal swab, presence of viral mutation(s) within the areas targeted by this assay, and inadequate number of viral copies(<138 copies/mL). A negative result must be combined with clinical observations, patient history, and epidemiological information. The expected result is Negative.  Fact Sheet for Patients:  EntrepreneurPulse.com.au  Fact Sheet for Healthcare Providers:  IncredibleEmployment.be  This test is no t yet approved or cleared by the [place] FDA and  has been authorized for detection and/or diagnosis of SARS-CoV-2 by FDA under an Emergency Use Authorization (EUA). This EUA will remain  in effect (meaning this test can be used) for the  duration of the COVID-19 declaration under Section 564(b)(1) of the Act, 21 U.S.C.section 360bbb-3(b)(1), unless the authorization is terminated  or revoked sooner.       Influenza A by PCR NEGATIVE NEGATIVE Final   Influenza B by PCR NEGATIVE NEGATIVE Final    Comment: (NOTE) The Xpert Xpress SARS-CoV-2/FLU/RSV plus assay is intended as an aid in the diagnosis of influenza from Nasopharyngeal swab specimens and should not be used as a sole basis for treatment. Nasal washings and aspirates are unacceptable for Xpert Xpress SARS-CoV-2/FLU/RSV testing.  Fact Sheet for Patients: EntrepreneurPulse.com.au  Fact Sheet for Healthcare Providers: IncredibleEmployment.be  This test is not yet approved or cleared by the [place] FDA and has been authorized for detection and/or diagnosis of SARS-CoV-2 by FDA under an Emergency Use Authorization (EUA). This EUA will remain in effect (meaning this test can be used) for the duration of the COVID-19 declaration under Section 564(b)(1) of the Act, 21 U.S.C. section 360bbb-3(b)(1), unless the authorization is terminated or revoked.  Performed at [facility], [place] [address] [place], [place] [address]   Blood Culture (routine x 2)     Status: None (Preliminary result)   Collection Time: 06/19/22  4:30 AM   Specimen: BLOOD RIGHT WRIST  Result Value Ref Range Status   Specimen Description BLOOD RIGHT WRIST  Final   Special Requests   Final    BOTTLES DRAWN AEROBIC AND ANAEROBIC Blood Culture adequate volume   Culture   Final    NO GROWTH 3 DAYS Performed at [facility], [place] [address], [place], [place] [address]    Report Status PENDING  Incomplete  Blood Culture (routine x 2)     Status: None (Preliminary result)   Collection Time: 06/19/22  4:36 AM   Specimen: BLOOD LEFT HAND  Result Value Ref Range Status   Specimen Description BLOOD LEFT HAND  Final   Special Requests   Final     BOTTLES DRAWN AEROBIC AND ANAEROBIC Blood Culture results may not be optimal due to an inadequate volume of blood received in culture bottles   Culture   Final    NO GROWTH 3 DAYS Performed at [facility], [place] [address], [place], [place] [address]  Report Status PENDING  Incomplete  Urine Culture     Status: Abnormal   Collection Time: 06/19/22  4:54 AM   Specimen: In/Out Cath Urine  Result Value Ref Range Status   Specimen Description IN/OUT CATH URINE  Final   Special Requests   Final    NONE Performed at [facility], [address], [place], [place] [address]    Culture >=100,000 COLONIES/mL ESCHERICHIA COLI (A)  Final   Report Status 06/21/2022 FINAL  Final   Organism ID, Bacteria ESCHERICHIA COLI (A)  Final      Susceptibility   Escherichia coli - MIC*    AMPICILLIN <=2 SENSITIVE Sensitive     CEFAZOLIN <=4 SENSITIVE Sensitive     CEFEPIME <=0.12 SENSITIVE Sensitive     CEFTRIAXONE <=0.25 SENSITIVE Sensitive     CIPROFLOXACIN <=0.25 SENSITIVE Sensitive     GENTAMICIN <=1 SENSITIVE Sensitive     IMIPENEM <=0.25 SENSITIVE Sensitive     NITROFURANTOIN <=16 SENSITIVE Sensitive     TRIMETH/SULFA <=20 SENSITIVE Sensitive     AMPICILLIN/SULBACTAM <=2 SENSITIVE Sensitive     PIP/TAZO <=4 SENSITIVE Sensitive     * >=100,000 COLONIES/mL ESCHERICHIA COLI  MRSA Next Gen by PCR, Nasal     Status: None   Collection Time: 06/20/22  3:58 PM   Specimen: Nasal Mucosa; Nasal Swab  Result Value Ref Range Status   MRSA by PCR Next Gen NOT DETECTED NOT DETECTED Final    Comment: (NOTE) The GeneXpert MRSA Assay (FDA approved for NASAL specimens only), is one component of a comprehensive MRSA colonization surveillance program. It is not intended to diagnose MRSA infection nor to guide or monitor treatment for MRSA infections. Test performance is not FDA approved in patients less than 56 years old. Performed at [facility], [place] [address] [place], [place] [address]           Radiology Studies: CARDIAC CATHETERIZATION  Result Date: 06/21/2022 CONCLUSIONS: Moderate three-vessel coronary disease involving the distal nondominant right coronary, the large first diagonal jailed by previous LAD stent, obtuse marginal and PDA disease, and ostial to proximal circumflex. Moderate pulmonary hypertension with mean PA pressure 30 mmHg, likely WHO group 2 with capillary wedge pressure of 26, LVEDP 31 mm, pulmonary vascular resistance 2.3 Wood units. RECOMMENDATIONS: Continue therapy of heart failure.   DG Chest Port 1 View  Result Date: 06/20/2022 CLINICAL DATA:  Dyspnea, fever EXAM: PORTABLE CHEST 1 VIEW COMPARISON:  06/19/2022 FINDINGS: Stable cardiomediastinal contours. Progressive patchy airspace opacity within the right lower lobe. Mild streaky left basilar opacity. No pleural effusion or pneumothorax. IMPRESSION: Progressive patchy airspace opacity within the right lower lobe, concerning for pneumonia. Mild streaky left basilar opacity could reflect atelectasis or additional focus of infection. Electronically Signed   By: Davina Poke D.O.   On: 06/20/2022 16:35        Scheduled Meds:  albuterol  2.5 mg Nebulization Q6H   amLODipine  5 mg Oral Daily   aspirin EC  81 mg Oral Daily   carvedilol  6.25 mg Oral BID WC   docusate sodium  100 mg Oral BID   furosemide  40 mg Intravenous BID   heparin  5,000 Units Subcutaneous Q8H   insulin aspart  0-15 Units Subcutaneous TID WC   insulin aspart  0-5 Units Subcutaneous QHS   insulin glargine-yfgn  15 Units Subcutaneous BID   isosorbide mononitrate  30 mg Oral Daily   leptospermum manuka honey  1 Application Topical  Daily   levothyroxine  88 mcg Oral QHS   rosuvastatin  20 mg Oral Daily   sodium chloride flush  3 mL Intravenous Q12H   sodium chloride flush  3 mL Intravenous Q12H   sodium chloride flush  3 mL Intravenous Q12H   Continuous Infusions:  sodium chloride     azithromycin 500 mg (06/20/22  1433)   ceFEPime (MAXIPIME) IV 2 g (06/22/22 0600)   promethazine (PHENERGAN) injection (IM or IVPB)       LOS: 3 days      Phillips Climes, MD [facility]   To contact the attending provider between 7A-7P or the covering provider during after hours 7P-7A, please log into the web site [email or web address] and access using universal [place] password for that web site. If you do not have the password, please call the hospital operator.  06/22/2022, 1:23 PM

## 2022-06-22 NOTE — Progress Notes (Addendum)
Progress Note  Patient Name: Danielle Murray Date of Encounter: 06/22/2022  [facility] Cardiologist: Jenkins Rouge, MD   Subjective   Short of breath, no chest pain.   Inpatient Medications    Scheduled Meds:  albuterol  2.5 mg Nebulization Q6H   amLODipine  5 mg Oral Daily   aspirin EC  81 mg Oral Daily   carvedilol  6.25 mg Oral BID WC   docusate sodium  100 mg Oral BID   furosemide  20 mg Intravenous Daily   heparin  5,000 Units Subcutaneous Q8H   insulin aspart  0-15 Units Subcutaneous TID WC   insulin aspart  0-5 Units Subcutaneous QHS   insulin glargine-yfgn  15 Units Subcutaneous BID   isosorbide mononitrate  30 mg Oral Daily   leptospermum manuka honey  1 Application Topical Daily   levothyroxine  88 mcg Oral QHS   rosuvastatin  20 mg Oral Daily   sodium chloride flush  3 mL Intravenous Q12H   sodium chloride flush  3 mL Intravenous Q12H   sodium chloride flush  3 mL Intravenous Q12H   Continuous Infusions:  sodium chloride     azithromycin 500 mg (06/20/22 1433)   ceFEPime (MAXIPIME) IV 2 g (06/22/22 0600)   promethazine (PHENERGAN) injection (IM or IVPB)     PRN Meds: sodium chloride, acetaminophen **OR** acetaminophen, bisacodyl, guaiFENesin, hydrALAZINE, morphine injection, ondansetron **OR** ondansetron (ZOFRAN) IV, oxyCODONE, polyethylene glycol, simethicone, sodium chloride flush   Vital Signs    Vitals:   06/21/22 2300 06/22/22 0330 06/22/22 0800 06/22/22 0815  BP: (!) 134/48 (!) 128/43 139/61   Pulse: 68 63 64   Resp: (!) 27 (!) 24 (!) 24   Temp: 97.7 F (36.5 C) 98.3 F (36.8 C) 98.5 F (36.9 C)   TempSrc: Oral Oral Oral   SpO2: 93% 91% 96% 95%  Weight:      Height:        Intake/Output Summary (Last 24 hours) at 06/22/2022 0924 Last data filed at 06/22/2022 0500 Gross per 24 hour  Intake 643.09 ml  Output 1250 ml  Net -606.91 ml      06/19/2022    4:26 AM 06/02/2022    9:29 AM 06/01/2022    8:44 PM  Last 3 Weights  Weight (lbs)  187 lb 185 lb 180 lb  Weight (kg) 84.823 kg 83.915 kg 81.647 kg      Telemetry    Sinus Rhythm - Personally Reviewed  ECG    No new tracing  Physical Exam   GEN: Increase WOB, wearing [place]  Neck: No JVD Cardiac: RRR, no murmurs, rubs, or gallops.  Respiratory: Diminished, mild expiratory wheeze GI: Soft, nontender, non-distended  MS: 1+ bilateral LE edema; No deformity. Right radial cath site stable Neuro:  Nonfocal  Psych: Normal affect   Labs    High Sensitivity Troponin:   Recent Labs  Lab 06/19/22 0430 06/19/22 0641  TROPONINIHS 2,421* 2,139*     Chemistry Recent Labs  Lab 06/19/22 0430 06/19/22 0442 06/20/22 2348 06/21/22 0642 06/21/22 1526 06/21/22 1531 06/22/22 0049  NA 134*   < > 134* 134* 137 129* 134*  K 3.7   < > 4.0 4.0 4.2 4.0 4.2  CL 102   < > 107 107  --   --  109  CO2 17*   < > 18* 17*  --   --  16*  GLUCOSE 180*   < > 139* 130*  --   --  130*  BUN 36*   < > 36* 37*  --   --  38*  CREATININE 1.45*   < > 1.25* 1.29*  --   --  1.27*  CALCIUM 8.1*   < > 7.9* 8.0*  --   --  7.9*  PROT 6.3*  --   --   --   --   --   --   ALBUMIN 2.5*  --   --   --   --   --   --   AST 22  --   --   --   --   --   --   ALT 12  --   --   --   --   --   --   ALKPHOS 111  --   --   --   --   --   --   BILITOT 1.7*  --   --   --   --   --   --   GFRNONAA 36*   < > 44* 42*  --   --  43*  ANIONGAP 15   < > 9 10  --   --  9   < > = values in this interval not displayed.    Lipids No results for input(s): "CHOL", "TRIG", "HDL", "LABVLDL", "LDLCALC", "CHOLHDL" in the last 168 hours.  Hematology Recent Labs  Lab 06/20/22 2348 06/21/22 0642 06/21/22 1526 06/21/22 1531 06/22/22 0049  WBC 12.1* 11.8*  --   --  9.8  RBC 3.29* 3.29*  --   --  3.25*  HGB 9.5* 9.3* 10.2* 10.2* 9.0*  HCT 28.7* 28.9* 30.0* 30.0* 29.0*  MCV 87.2 87.8  --   --  89.2  MCH 28.9 28.3  --   --  27.7  MCHC 33.1 32.2  --   --  31.0  RDW 14.6 14.7  --   --  14.7  PLT 319 295  --   --  298    Thyroid No results for input(s): "TSH", "FREET4" in the last 168 hours.  BNP Recent Labs  Lab 06/19/22 0921  BNP 1,579.3*    DDimer No results for input(s): "DDIMER" in the last 168 hours.   Radiology    CARDIAC CATHETERIZATION  Result Date: 06/21/2022 CONCLUSIONS: Moderate three-vessel coronary disease involving the distal nondominant right coronary, the large first diagonal jailed by previous LAD stent, obtuse marginal and PDA disease, and ostial to proximal circumflex. Moderate pulmonary hypertension with mean PA pressure 30 mmHg, likely WHO group 2 with capillary wedge pressure of 26, LVEDP 31 mm, pulmonary vascular resistance 2.3 Wood units. RECOMMENDATIONS: Continue therapy of heart failure.   DG Chest Port 1 View  Result Date: 06/20/2022 CLINICAL DATA:  Dyspnea, fever EXAM: PORTABLE CHEST 1 VIEW COMPARISON:  06/19/2022 FINDINGS: Stable cardiomediastinal contours. Progressive patchy airspace opacity within the right lower lobe. Mild streaky left basilar opacity. No pleural effusion or pneumothorax. IMPRESSION: Progressive patchy airspace opacity within the right lower lobe, concerning for pneumonia. Mild streaky left basilar opacity could reflect atelectasis or additional focus of infection. Electronically Signed   By: Davina Poke D.O.   On: 06/20/2022 16:35    Cardiac Studies   Echo: 06/19/22  IMPRESSIONS     1. No left ventricular thrombus is seen (Definity contrast was used).  Wall motion suggests infarction due to occlusion of the mid-LAD artery or  a large ramus intermedius vessel that reaches the lateral apex. Left  ventricular  ejection fraction, by  estimation, is 35 to 40%. The left ventricle has moderately decreased  function. The left ventricle demonstrates regional wall motion  abnormalities (see scoring diagram/findings for description). There is  mild concentric left ventricular hypertrophy. Left   ventricular diastolic parameters are consistent with Grade  II diastolic  dysfunction (pseudonormalization). Elevated left atrial pressure.   2. Right ventricular systolic function is normal. The right ventricular  size is normal. There is normal pulmonary artery systolic pressure. The  estimated right ventricular systolic pressure is 32.4 mmHg.   3. Left atrial size was severely dilated.   4. The mitral valve is degenerative. Mild to moderate mitral valve  regurgitation. No evidence of mitral stenosis. The mean mitral valve  gradient is 3.1 mmHg. Moderate mitral annular calcification.   5. Tricuspid valve regurgitation is mild to moderate.   6. The aortic valve is tricuspid. Aortic valve regurgitation is not  visualized. No aortic stenosis is present.   Comparison(s): Prior images unable to be directly viewed, comparison made  by report only. The left ventricular function is significantly worse. The  left ventricular wall motion abnormalities are new.   FINDINGS   Left Ventricle: No left ventricular thrombus is seen (Definity contrast  was used). Wall motion suggests infarction due to occlusion of the mid-LAD  artery or a large ramus intermedius vessel that reaches the lateral apex.  Left ventricular ejection  fraction, by estimation, is 35 to 40%. The left ventricle has moderately  decreased function. The left ventricle demonstrates regional wall motion  abnormalities. Definity contrast agent was given IV to delineate the left  ventricular endocardial borders.  The left ventricular internal cavity size was normal in size. There is  mild concentric left ventricular hypertrophy. Left ventricular diastolic  parameters are consistent with Grade II diastolic dysfunction  (pseudonormalization). Elevated left atrial  pressure.      LV Wall Scoring:  The apical lateral segment, mid anterolateral segment, apical anterior  segment, and apex are akinetic. The anterior wall, entire septum, entire  inferior wall, posterior wall, and basal  anterolateral segment are normal.   Right Ventricle: The right ventricular size is normal. No increase in  right ventricular wall thickness. Right ventricular systolic function is  normal. There is normal pulmonary artery systolic pressure. The tricuspid  regurgitant velocity is 2.81 m/s, and   with an assumed right atrial pressure of 3 mmHg, the estimated right  ventricular systolic pressure is 40.1 mmHg.   Left Atrium: Left atrial size was severely dilated.   Right Atrium: Right atrial size was normal in size.   Pericardium: There is no evidence of pericardial effusion.   Mitral Valve: The mitral valve is degenerative in appearance. Moderate  mitral annular calcification. Mild to moderate mitral valve regurgitation,  with centrally-directed jet. No evidence of mitral valve stenosis. MV peak  gradient, 10.0 mmHg. The mean  mitral valve gradient is 3.1 mmHg.   Tricuspid Valve: The tricuspid valve is normal in structure. Tricuspid  valve regurgitation is mild to moderate.   Aortic Valve: The aortic valve is tricuspid. Aortic valve regurgitation is  not visualized. No aortic stenosis is present. Aortic valve mean gradient  measures 4.0 mmHg. Aortic valve peak gradient measures 9.0 mmHg. Aortic  valve area, by VTI measures 1.90  cm.   Pulmonic Valve: The pulmonic valve was normal in structure. Pulmonic valve  regurgitation is not visualized.   Aorta: The aortic root and ascending aorta are structurally normal, with  no evidence of  dilitation.   IAS/Shunts: No atrial level shunt detected by color flow Doppler.   Cath: 06/21/22  CONCLUSIONS: Moderate three-vessel coronary disease involving the distal nondominant right coronary, the large first diagonal jailed by previous LAD stent, obtuse marginal and PDA disease, and ostial to proximal circumflex. Moderate pulmonary hypertension with mean PA pressure 30 mmHg, likely WHO group 2 with capillary wedge pressure of 26, LVEDP 31 mm,  pulmonary vascular resistance 2.3 Wood units.   RECOMMENDATIONS:   Continue therapy of heart failure.  Diagnostic Dominance: Co-dominant   Flowsheet Row Most Recent Value  Fick Cardiac Output 5.13 L/min  Fick Cardiac Output Index 2.7 (L/min)/BSA  RA A Wave 18 mmHg  RA V Wave 14 mmHg  RA Mean 15 mmHg  RV Systolic Pressure 60 mmHg  RV Diastolic Pressure 6 mmHg  RV EDP 16 mmHg  PA Systolic Pressure 61 mmHg  PA Diastolic Pressure 21 mmHg  PA Mean 38 mmHg  PW A Wave 50 mmHg  PW V Wave 26 mmHg  PW Mean 26 mmHg  AO Systolic Pressure 258 mmHg  AO Diastolic Pressure 52 mmHg  AO Mean 527 mmHg  LV Systolic Pressure 782 mmHg  LV Diastolic Pressure 14 mmHg  LV EDP 31 mmHg  AOp Systolic Pressure 423 mmHg  AOp Diastolic Pressure 55 mmHg  AOp Mean Pressure 96 mmHg  LVp Systolic Pressure 536 mmHg  LVp Diastolic Pressure 14 mmHg  LVp EDP Pressure 29 mmHg  QP/QS 1  TPVR Index 14.09 HRUI  TSVR Index 38.94 HRUI  PVR SVR Ratio 0.13  TPVR/TSVR Ratio 0.36    Patient Profile     81 y.o. female with a hx of LE fx and repair (d/c 07/25), DES LAD 2014 w/ med rx for RCA/PDA dz, DM2, HTN, HLD, SVT, depression, diverticulosis, who was seen 06/19/2022 for the evaluation of NSTEMI at the request of Dr Sherry Ruffing.  Assessment & Plan    NSTEMI: High-sensitivity troponin peaked at 2421.  Treated with IV heparin.  Underwent cardiac catheterization 8/10 noted above with moderate three-vessel coronary disease involving a nondominant RCA, large first diagonal of 90% which is jailed by previous LAD stent as well as OM and PDA disease.  Ostial/proximal circumflex disease of 70%.  Recommendations to treat medically.  -- Continue aspirin, carvedilol 6.25 mg twice daily, your 30 mg daily, Crestor 20 mg daily.  HFrEF Hypoxic respiratory failure Possible PNA -- echocardiogram this admission shows decline in LVEF to 35 to 40%, mild concentric LVH, grade 2 diastolic dysfunction, normal RV size and function, fairly  dilated left atrium, mild to moderate MR. New wall motion abnormality with apical lateral, mid anterior lateral and apex being akinetic.  LVEDP 31 mmHg on cath yesterday.  She has been on IV Lasix 20 mg x 2.  Still remains volume overloaded. --Increase IV Lasix to 40 mg twice daily --Continue carvedilol 6.25 mg twice daily, will need to attempt to optimize GDMT once improved from volume standpoint.   Acute on chronic blood loss anemia: Received 1 unit PRBCs with improvement in hemoglobin 8.4>>9.0  Hypertension: Stable --Continue carvedilol 6.25 mg twice daily, amlodipine  Hyperlipidemia: Crestor 20 mg daily  Diabetes: Hemoglobin A1c 7.5 --Currently on SSI --Jardiance PTA  Recent hip fracture:  -- in rehab facility   For questions or updates, please contact [facility] Please consult [email or web address] for contact info under        Signed, Reino Bellis, NP  06/22/2022, 9:24 AM     Agree with note by Ria Comment  Mancel Bale NP-C  Mr. Padgett had right left heart cath performed radial/brachial yesterday by Dr. Tamala Julian.  Her LAD stent was patent.  She had ostial diagonal branch stenosis that was jailed by the prior stent.  She had a left dominant system with 70% ostial/proximal hypodense circumflex.  She did have some disease in a nondominant RCA.  Her LVEDP was only 14 but her PA pressure was 60 and her wedge pressure was elevated.  She has not had much output with furosemide 20 mg.  This will be increased to 40 mg IV twice daily.  Pulmonary is on board.  She is dyspneic on 6 L.  She has mild to moderate MR with a decline in her EF to the 35 to 40% range compared to prior normal 2D echo.  Her wall motion abnormalities are in the distribution of the circumflex.  I am concerned that the proximal/ostial circumflex may be more significant and appears angiographically.  Will discuss with Dr. Tamala Julian.  The meantime, we will continue to diurese.  Lorretta Harp, M.D., [place], [facility], [place] [address] [place], [place]  [address]  [phone] 06/22/2022 10:59 AM

## 2022-06-22 NOTE — Consult Note (Signed)
NAME:  Danielle Murray, MRN:  [id number], DOB:  08/23/1941, LOS: 3 ADMISSION DATE:  06/19/2022, CONSULTATION DATE: 06/22/2022 REFERRING MD: Triad, CHIEF COMPLAINT: Hypoxia  History of Present Illness:  81 year old female who recently fractured her femur was in rehab at which time she developed increasing shortness of breath along with elevated troponins and she had recently had a cardiac cath in May.  Troponins peaked at 2421 she had a repeat cardiac catheterization done 8/112023 as noted.  Pulmonary critical care was called to the bedside on 06/22/2022 for acute respiratory hypoxic respiratory failure with the beginning of BiPAP.  Patient was examined BiPAP was canceled and she was diuresed per cardiology I suspect most of this is volume overload although her chest x-ray does show bilateral airspace disease clearness safety antimicrobial therapy will be continued again she does not need noninvasive mechanical ventilatory support at this time.  She also has bronchodilators for wheezing do not think steroids are needed at this time.  I suspect with aggressive diuresis she will improve remarkably.  Pulmonary critical care will continue to follow along  Pertinent  Medical History   Past Medical History:  Diagnosis Date   Arthritis    "fingers, all my joints" (09/03/2013)   Coronary artery disease    a. 08/2013: unstable angina s/p PTCA/DES to LAD, medical therapy for residual severe stenosis of a mid-distal left PDA branch of large dominant LCx, moderate RCA disease (consider PCI of L-PDA if she fails med rx).   DEPRESSION    Diverticulosis    Hyperlipidemia    Hypertension    HYPOTHYROIDISM    Preseptal cellulitis of right upper eyelid 03/10/2016   SVT (supraventricular tachycardia) ([place])    a. very brief transient SVT during 08/2013 admission overnight.   Type II diabetes mellitus ([place])    Dr Chalmers Cater     [facility] Events: Including procedures, antibiotic start and stop dates in  addition to other pertinent events   06/21/2022 cardiac catheterization CONCLUSIONS: Moderate three-vessel coronary disease involving the distal nondominant right coronary, the large first diagonal jailed by previous LAD stent, obtuse marginal and PDA disease, and ostial to proximal circumflex. Moderate pulmonary hypertension with mean PA pressure 30 mmHg, likely WHO group 2 with capillary wedge pressure of 26, LVEDP 31 mm, pulmonary vascular resistance 2.3 Wood units.    Interim History / Subjective:  Reportedly more hypoxic  Objective   Blood pressure 139/61, pulse 64, temperature 98.5 F (36.9 C), temperature source Oral, resp. rate (!) 24, height '5\' 4"'$  (1.626 m), weight 84.8 kg, SpO2 95 %. So I ordered another unit just in case he needed and so regular insulin will be we will add to the-3 weanable adage thank you for doing all that     Intake/Output Summary (Last 24 hours) at 06/22/2022 1108 Last data filed at 06/22/2022 0500 Gross per 24 hour  Intake 643.09 ml  Output 1250 ml  Net -606.91 ml   Filed Weights   06/19/22 0426  Weight: 84.8 kg    Examination: General: Elderly female obese no JVD is appreciated HENT: No JVD is appreciated Lungs: Diminished throughout faint expiratory wheezes noted VCD appreciated Cardiovascular: Heart sounds are regular Abdomen: Obese soft nontender Extremities: Warm 2-3+ edema Neuro: Grossly intact without focal defect GU: Amber urine  Resolved Hospital Problem list     Assessment & Plan:  Increased wheezing and shortness of breath since cardiac catheterization 1 day prior.  Note chest x-ray 06/20/2022 shows bilateral airspace disease. Suspect large  component of volume overload. Hold on nonnvasive mechanical ventilatory support for now O2 to keep sats greater than 92% Aggressive diuresis been instituted by cardiology Recheck chest x-ray in the a.m. Agree with empirical antimicrobial therapy for community-acquired pneumonia Continue  bronchodilators No need for steroids at this time Agree with mucolytic's Pulmonary critical care will continue to follow  Coronary artery disease status post cardiac catheterization.  Congestive heart failure Per cardiology  Femur fracture well-healed Monitor  Hypothyroidism Synthroid   Best Practice (right click and "Reselect all SmartList Selections" daily)   Diet/type: Regular consistency (see orders) DVT prophylaxis: other GI prophylaxis: H2B Lines: N/A Foley:  N/A Code Status:  full code Last date of multidisciplinary goals of care discussion [tbd]  Labs   CBC: Recent Labs  Lab 06/19/22 0430 06/19/22 0442 06/20/22 0056 06/20/22 2348 06/21/22 0642 06/21/22 1526 06/21/22 1531 06/22/22 0049  WBC 12.9*  --  13.7* 12.1* 11.8*  --   --  9.8  NEUTROABS 12.0*  --   --   --   --   --   --   --   HGB 9.7*   < > 8.4* 9.5* 9.3* 10.2* 10.2* 9.0*  HCT 31.1*   < > 26.3* 28.7* 28.9* 30.0* 30.0* 29.0*  MCV 90.9  --  90.1 87.2 87.8  --   --  89.2  PLT 413*  --  371 319 295  --   --  298   < > = values in this interval not displayed.    Basic Metabolic Panel: Recent Labs  Lab 06/20/22 0056 06/20/22 1500 06/20/22 2348 06/21/22 0642 06/21/22 1526 06/21/22 1531 06/22/22 0049  NA 135 134* 134* 134* 137 129* 134*  K 3.4* 4.3 4.0 4.0 4.2 4.0 4.2  CL 108 105 107 107  --   --  109  CO2 17* 18* 18* 17*  --   --  16*  GLUCOSE 117* 286* 139* 130*  --   --  130*  BUN 36* 36* 36* 37*  --   --  38*  CREATININE 1.29* 1.23* 1.25* 1.29*  --   --  1.27*  CALCIUM 7.8* 8.0* 7.9* 8.0*  --   --  7.9*   GFR: Estimated Creatinine Clearance: 37.2 mL/min (A) (by C-G formula based on SCr of 1.27 mg/dL (H)). Recent Labs  Lab 06/19/22 0430 06/19/22 0641 06/20/22 0056 06/20/22 2348 06/21/22 0642 06/22/22 0049  PROCALCITON  --   --   --   --  26.12 16.11  WBC 12.9*  --  13.7* 12.1* 11.8* 9.8  LATICACIDVEN 1.2 1.1  --   --   --   --     Liver Function Tests: Recent Labs  Lab  06/19/22 0430  AST 22  ALT 12  ALKPHOS 111  BILITOT 1.7*  PROT 6.3*  ALBUMIN 2.5*   No results for input(s): "LIPASE", "AMYLASE" in the last 168 hours. No results for input(s): "AMMONIA" in the last 168 hours.  ABG    Component Value Date/Time   PHART 7.304 (L) 06/21/2022 1531   PCO2ART 31.2 (L) 06/21/2022 1531   PO2ART 95 06/21/2022 1531   HCO3 15.5 (L) 06/21/2022 1531   TCO2 16 (L) 06/21/2022 1531   ACIDBASEDEF 10.0 (H) 06/21/2022 1531   O2SAT 97 06/21/2022 1531     Coagulation Profile: Recent Labs  Lab 06/19/22 0430  INR 1.2    Cardiac Enzymes: No results for input(s): "CKTOTAL", "CKMB", "CKMBINDEX", "TROPONINI" in the last 168 hours.  HbA1C:  Hgb A1c MFr Bld  Date/Time Value Ref Range Status  06/19/2022 04:30 AM 7.5 (H) 4.8 - 5.6 % Final    Comment:    (NOTE) Pre diabetes:          5.7%-6.4%  Diabetes:              >6.4%  Glycemic control for   <7.0% adults with diabetes   06/01/2022 08:37 PM 9.3 (H) 4.8 - 5.6 % Final    Comment:    (NOTE) Pre diabetes:          5.7%-6.4%  Diabetes:              >6.4%  Glycemic control for   <7.0% adults with diabetes     CBG: Recent Labs  Lab 06/21/22 0733 06/21/22 1116 06/21/22 1604 06/21/22 1918 06/22/22 0605  GLUCAP 125* 117* 122* 128* 121*    Review of Systems:   10 point review of system taken, please see HPI for positives and negatives. Positive short of breath worsens with cardiac catheterization Denies fever chills or sweats  Past Medical History:  She,  has a past medical history of Arthritis, Coronary artery disease, DEPRESSION, Diverticulosis, Hyperlipidemia, Hypertension, HYPOTHYROIDISM, Preseptal cellulitis of right upper eyelid (03/10/2016), SVT (supraventricular tachycardia) ([place]), and Type II diabetes mellitus ([place]).   Surgical History:   Past Surgical History:  Procedure Laterality Date   ABDOMINAL HYSTERECTOMY  1989   no BSO; dysfunctional menses   CATARACT EXTRACTION W/  INTRAOCULAR LENS  IMPLANT, BILATERAL Bilateral 2014   CATARACT EXTRACTION W/PHACO Right 07/02/2013   CATARACT EXTRACTION W/PHACO Left 06/01/2013   COLONOSCOPY  2003   Tics   CORONARY ANGIOPLASTY WITH STENT PLACEMENT  09/03/2013   "1" (09/03/2013)   FEMUR IM NAIL Left 06/02/2022   Procedure: INTRAMEDULLARY (IM) NAIL FEMORAL;  Surgeon: Willaim Sheng, MD;  Location: [place];  Service: Orthopedics;  Laterality: Left;   LEFT HEART CATHETERIZATION WITH CORONARY ANGIOGRAM N/A 09/03/2013   Procedure: LEFT HEART CATHETERIZATION WITH CORONARY ANGIOGRAM;  Surgeon: Blane Ohara, MD;  Location: [facility] CATH LAB;  Service: Cardiovascular;  Laterality: N/A;   RIGHT/LEFT HEART CATH AND CORONARY ANGIOGRAPHY N/A 06/21/2022   Procedure: RIGHT/LEFT HEART CATH AND CORONARY ANGIOGRAPHY;  Surgeon: Belva Crome, MD;  Location: [place] CV LAB;  Service: Cardiovascular;  Laterality: N/A;   TOTAL KNEE ARTHROPLASTY Left 06/2005   Dr Gladstone Lighter     Social History:   reports that she has never smoked. She has never used smokeless tobacco. She reports current alcohol use. She reports that she does not use drugs.   Family History:  Her family history includes Asthma in her mother; Coronary artery disease in her brother; Diabetes in her sister; Heart attack in her sister; Heart attack (age of onset: 40) in her father; Heart failure in her mother; Hyperlipidemia in her brother and sister; Hypertension in her brother, father, mother, and sister; Stroke (age of onset: 109) in her father. There is no history of Breast cancer.   Allergies Allergies  Allergen Reactions   Penicillins     Diffuse joint pain @ age 71 in context of Scarlet Fever  Did it involve swelling of the face/tongue/throat, SOB, or low BP? No Did it involve sudden or severe rash/hives, skin peeling, or any reaction on the inside of your mouth or nose? No Did you need to seek medical attention at a hospital or doctor's office? No When did it last  happen?  childhood  If all above answers are "NO", may proceed with cephalosporin use.       Home Medications  Prior to Admission medications   Medication Sig Start Date End Date Taking? Authorizing Provider  amLODipine (NORVASC) 5 MG tablet Take 1 tablet (5 mg total) by mouth daily. Please keep upcoming appointment for future refills. Thank you. 04/30/22  Yes Josue Hector, MD  carvedilol (COREG) 6.25 MG tablet TAKE 1 TABLET BY MOUTH TWICE DAILY WITH A MEAL 11/14/21  Yes Josue Hector, MD  Cholecalciferol (VITAMIN D3 PO) Take 1 tablet by mouth daily.   Yes [provider]  clopidogrel (PLAVIX) 75 MG tablet Take 1 tablet by mouth once daily 05/04/22  Yes Josue Hector, MD  enoxaparin (LOVENOX) 40 MG/0.4ML injection Inject 0.4 mLs (40 mg total) into the skin daily for 23 days. 06/06/22 06/29/22 Yes Nita Sells, MD  furosemide (LASIX) 40 MG tablet Take 1 tablet (40 mg total) by mouth daily as needed for fluid or edema. Patient taking differently: Take 40 mg by mouth daily. 10/14/20  Yes Josue Hector, MD  isosorbide mononitrate (IMDUR) 30 MG 24 hr tablet Take 1 tablet by mouth once daily 11/14/21  Yes Josue Hector, MD  JARDIANCE 25 MG TABS tablet Take 25 mg by mouth daily. 05/11/22  Yes [provider]  levothyroxine (SYNTHROID, LEVOTHROID) 88 MCG tablet Take 88 mcg by mouth at bedtime.    Yes [provider]  nitroGLYCERIN (NITROSTAT) 0.4 MG SL tablet Place 1 tablet (0.4 mg total) under the tongue every 5 (five) minutes as needed for chest pain (3 doses max). 08/04/18  Yes Josue Hector, MD  NOVOLIN 70/30 FLEXPEN (70-30) 100 UNIT/ML KwikPen Inject 30-50 Units into the skin in the morning and at bedtime. Take 30 units in the morning and Take 50 units at bedtime 04/08/21  Yes [provider]  oxyCODONE (OXY IR/ROXICODONE) 5 MG immediate release tablet Take 1-2 tablets (5-10 mg total) by mouth every 4 (four) hours as needed for severe pain or moderate  pain. 06/05/22  Yes Nita Sells, MD  Probiotic Product (PROBIOTIC PO) Take 1 tablet by mouth daily.   Yes [provider]  rosuvastatin (CRESTOR) 5 MG tablet Take 1 tablet (5 mg total) by mouth daily. Patient taking differently: Take 10 mg by mouth daily. 10/14/20  Yes Josue Hector, MD     Critical care time: 34 min    Richardson Landry Lisha Vitale ACNP Acute Care Nurse Practitioner [place] Please consult Amion 06/22/2022, 11:08 AM

## 2022-06-22 NOTE — Progress Notes (Signed)
Bipap was set up and placed in the room to prepare for bipap. Bipap initially held for 20-30 minutes d/t pt recently drinking fluids and receiving several morning oral meds.    I came back to place pt on bipap however CCM Richardson Landry Minor NP at bedside to assess patient, states to hold off on placing pt on bipap for now d/t pt not currently in distress and plans to diurese patient.  This was discussed w/ RN.  No distress currently noted.

## 2022-06-23 ENCOUNTER — Inpatient Hospital Stay (HOSPITAL_COMMUNITY): Payer: PPO

## 2022-06-23 DIAGNOSIS — I2510 Atherosclerotic heart disease of native coronary artery without angina pectoris: Secondary | ICD-10-CM | POA: Diagnosis not present

## 2022-06-23 DIAGNOSIS — I214 Non-ST elevation (NSTEMI) myocardial infarction: Secondary | ICD-10-CM | POA: Diagnosis not present

## 2022-06-23 DIAGNOSIS — I5021 Acute systolic (congestive) heart failure: Secondary | ICD-10-CM

## 2022-06-23 LAB — BASIC METABOLIC PANEL
Anion gap: 10 (ref 5–15)
BUN: 42 mg/dL — ABNORMAL HIGH (ref 8–23)
CO2: 19 mmol/L — ABNORMAL LOW (ref 22–32)
Calcium: 8.2 mg/dL — ABNORMAL LOW (ref 8.9–10.3)
Chloride: 107 mmol/L (ref 98–111)
Creatinine, Ser: 1.22 mg/dL — ABNORMAL HIGH (ref 0.44–1.00)
GFR, Estimated: 45 mL/min — ABNORMAL LOW (ref >60)
Glucose, Bld: 163 mg/dL — ABNORMAL HIGH (ref 70–99)
Potassium: 3.9 mmol/L (ref 3.5–5.1)
Sodium: 136 mmol/L (ref 135–145)

## 2022-06-23 LAB — CBC
HCT: 28.4 % — ABNORMAL LOW (ref 36.0–46.0)
Hemoglobin: 9.3 g/dL — ABNORMAL LOW (ref 12.0–15.0)
MCH: 28.4 pg (ref 26.0–34.0)
MCHC: 32.7 g/dL (ref 30.0–36.0)
MCV: 86.6 fL (ref 80.0–100.0)
Platelets: 332 10*3/uL (ref 150–400)
RBC: 3.28 MIL/uL — ABNORMAL LOW (ref 3.87–5.11)
RDW: 14.6 % (ref 11.5–15.5)
WBC: 8.8 10*3/uL (ref 4.0–10.5)
nRBC: 0 % (ref 0.0–0.2)

## 2022-06-23 LAB — GLUCOSE, CAPILLARY
Glucose-Capillary: 286 mg/dL — ABNORMAL HIGH (ref 70–99)
Glucose-Capillary: 294 mg/dL — ABNORMAL HIGH (ref 70–99)
Glucose-Capillary: 199 mg/dL — ABNORMAL HIGH (ref 70–99)
Glucose-Capillary: 255 mg/dL — ABNORMAL HIGH (ref 70–99)

## 2022-06-23 LAB — LIPOPROTEIN A (LPA): Lipoprotein (a): 36.4 nmol/L — ABNORMAL HIGH (ref <75.0)

## 2022-06-23 LAB — PROCALCITONIN: Procalcitonin: 10.56 ng/mL

## 2022-06-23 MED ORDER — FUROSEMIDE 10 MG/ML IJ SOLN: 60.0000 mg | Freq: Two times a day (BID) | INTRAMUSCULAR | Status: DC

## 2022-06-23 MED ORDER — CLOPIDOGREL BISULFATE 75 MG PO TABS
75.0000 mg | ORAL_TABLET | Freq: Every day | ORAL | Status: DC
  Administered 2022-06-23 – 2022-06-24 (×2): 75 mg via ORAL
  Filled 2022-06-23 (×3): qty 1

## 2022-06-23 MED ORDER — ALBUTEROL SULFATE (2.5 MG/3ML) 0.083% IN NEBU
2.5000 mg | INHALATION_SOLUTION | Freq: Three times a day (TID) | RESPIRATORY_TRACT | Status: DC
  Administered 2022-06-23 – 2022-06-25 (×5): 2.5 mg via RESPIRATORY_TRACT
  Filled 2022-06-23 (×5): qty 3

## 2022-06-23 MED ORDER — LOSARTAN POTASSIUM 25 MG PO TABS
12.5000 mg | ORAL_TABLET | Freq: Every day | ORAL | Status: DC
  Administered 2022-06-24 – 2022-06-28 (×5): 12.5 mg via ORAL
  Filled 2022-06-23 (×5): qty 1

## 2022-06-23 MED ORDER — ALBUTEROL SULFATE (2.5 MG/3ML) 0.083% IN NEBU: 2.5000 mg | INHALATION_SOLUTION | RESPIRATORY_TRACT | Status: DC | PRN

## 2022-06-23 MED ORDER — FUROSEMIDE 10 MG/ML IJ SOLN
80.0000 mg | Freq: Two times a day (BID) | INTRAMUSCULAR | Status: DC
  Administered 2022-06-23 – 2022-06-26 (×5): 80 mg via INTRAVENOUS
  Filled 2022-06-23 (×6): qty 8

## 2022-06-23 NOTE — Progress Notes (Signed)
PROGRESS NOTE    Danielle Murray  HDQ:[id number] DOB: 25-Jun-1941 DOA: 06/19/2022 PCP: [name], MD    Chief Complaint  Patient presents with   Fever    Brief Narrative:   Danielle Murray is a 81 y.o. female with medical history significant of CAD s/p stent; HTN; HLD; hypothyroidism; and DM presenting with fever. She was last admitted from 7/21-25 for hip fracture s/p repair.  She has been doing well with her rehab but became SOB a day or two ago.  She has had SOB periodically prior to last hospitalization, would have to sit down and rest with exertion.  Worse in the last 2 days significantly.  She has had mild pain in the back of her shoulder blades and pressure in her substernal region, "not real bad though."  She developed fever in the last 2 days, max 102.  No cough.  She was wheezing early this AM.  No urinary symptoms.   - her work up significant NSTEMI, and pneumonia, she is admitted for further workup.  Assessment & Plan:   Principal Problem:   NSTEMI (non-ST elevated myocardial infarction) ([place]) Active Problems:   Hypothyroidism   Essential hypertension   Uncontrolled type 2 diabetes mellitus with hyperglycemia, with long-term current use of insulin (HCC)   CAD (coronary artery disease), native coronary artery   Dyslipidemia   Status post surgery   Pressure injury of skin   Acute on chronic combined systolic and diastolic HF (heart failure) (HCC)  Pneumonia Acute hypoxemic respiratory failure -Patient presenting with cough, fever to 102, mildly decreased oxygen saturation, and infiltrate in right lower lobe on chest x-ray, as well as significantly elevated procalcitonin, she has been treated for up pneumonia. -Remains with significant dyspnea, broadened antibiotic coverage to vancomycin, azithromycin and cefepime, MRSA PCR negative, so discontinued vancomycin -Encouraged use incentive spirometry and flutter valve, she remains with very poor inspiratory effort despite  urging her multiple times -As needed BiPAP  Acute systolic CHF -Echo showing drop in EF 35 to 40% with wall motion abnormalities -Continue with IV diuresis, -1.3 L over last 24 hours -Continue with Coreg, Aldactone, started on losartan   Non- stemi -Troponin significantly elevated, peaked at 2400, and EKG with acute changes, 2D echo shows decline in her EF 35 to 40%. -On heparin gtt. initially -Treatment Per cardiology, cardiac cath on 8/10 significant for moderate three-vessel coronary artery disease, recommendation for medical management(some consideration for revascularization by cardiology) -Continue with Coreg, aspirin, Crestor and Imdur, started on plavix  as well.  Acute on chronic blood loss anemia -Received 1 unit PRBC 8/9, especially in the setting of symptomatic anemia and MRI, hemoglobin stable this morning at 9.5  HTN -Continue carvedilol, amlodipine -on PRN hydralazine    HLD -Continue Crestor -Check lipids   DM -A1c is 7.5, suboptimal control -Hold Jardiance -Switch 70/30 to sem-glee for now -Will cover with moderate-scale SSI for now   Hypothyroidism -Continue Synthroid   Recent hip fracture -Appears to be doing well from this standpoint -She is likely to need to return to rehab following this hospitalization     Obesity Body mass index is 32.1 kg/m.  Pressure ulcer -Present on admission -Consulted wound care  Pressure Injury 06/19/22 Buttocks Left Stage 2 -  Partial thickness loss of dermis presenting as a shallow open injury with a red, pink wound bed without slough. (Active)  06/19/22 1202  Location: Buttocks  Location Orientation: Left  Staging: Stage 2 -  Partial thickness loss  of dermis presenting as a shallow open injury with a red, pink wound bed without slough.  Wound Description (Comments):   Present on Admission: Yes       DVT prophylaxis: Heparin GTT>> [place] heparin Code Status: Full Family Communication: D/W husband at bedside  daily Disposition:   Status is: Inpatient    Consultants:  Cardiology PCCM  Subjective:  Patient reports she is feeling better today, no chest pain, reports dyspnea has improved  Objective: Vitals:   06/23/22 0258 06/23/22 0600 06/23/22 0710 06/23/22 0742  BP:   (!) 132/48   Pulse:   65   Resp:  20 18   Temp:   97.6 F (36.4 C)   TempSrc:   Oral   SpO2: 95% 96% 96% 97%  Weight:      Height:        Intake/Output Summary (Last 24 hours) at 06/23/2022 1042 Last data filed at 06/23/2022 0300 Gross per 24 hour  Intake 590 ml  Output 1450 ml  Net -860 ml   Filed Weights   06/19/22 0426  Weight: 84.8 kg    Examination:  Awake Alert, Oriented X 3, No new F.N deficits, extremely frail, deconditioned Symmetrical Chest wall movement, Good air movement bilaterally, irales  at the bases, less tachypneic today RRR,No Gallops,Rubs or new Murmurs, No Parasternal Heave +ve B.Sounds, Abd Soft, No tenderness, No rebound - guarding or rigidity. No Cyanosis, Clubbing or edema, No new Rash or bruise         Data Reviewed: I have personally reviewed following labs and imaging studies  CBC: Recent Labs  Lab 06/19/22 0430 06/19/22 0442 06/20/22 0056 06/20/22 2348 06/21/22 0642 06/21/22 1526 06/21/22 1531 06/22/22 0049 06/23/22 0025  WBC 12.9*  --  13.7* 12.1* 11.8*  --   --  9.8 8.8  NEUTROABS 12.0*  --   --   --   --   --   --   --   --   HGB 9.7*   < > 8.4* 9.5* 9.3* 10.2* 10.2* 9.0* 9.3*  HCT 31.1*   < > 26.3* 28.7* 28.9* 30.0* 30.0* 29.0* 28.4*  MCV 90.9  --  90.1 87.2 87.8  --   --  89.2 86.6  PLT 413*  --  371 319 295  --   --  298 332   < > = values in this interval not displayed.    Basic Metabolic Panel: Recent Labs  Lab 06/20/22 1500 06/20/22 2348 06/21/22 0642 06/21/22 1526 06/21/22 1531 06/22/22 0049 06/23/22 0025  NA 134* 134* 134* 137 129* 134* 136  K 4.3 4.0 4.0 4.2 4.0 4.2 3.9  CL 105 107 107  --   --  109 107  CO2 18* 18* 17*  --   --   16* 19*  GLUCOSE 286* 139* 130*  --   --  130* 163*  BUN 36* 36* 37*  --   --  38* 42*  CREATININE 1.23* 1.25* 1.29*  --   --  1.27* 1.22*  CALCIUM 8.0* 7.9* 8.0*  --   --  7.9* 8.2*    GFR: Estimated Creatinine Clearance: 38.7 mL/min (A) (by C-G formula based on SCr of 1.22 mg/dL (H)).  Liver Function Tests: Recent Labs  Lab 06/19/22 0430  AST 22  ALT 12  ALKPHOS 111  BILITOT 1.7*  PROT 6.3*  ALBUMIN 2.5*    CBG: Recent Labs  Lab 06/21/22 1918 06/22/22 1017 06/22/22 1128 06/22/22 1557 06/23/22 0859  GLUCAP 128* 121* 157* 164* 286*     Recent Results (from the past 240 hour(s))  Resp Panel by RT-PCR (Flu A&B, Covid) Peripheral     Status: None   Collection Time: 06/19/22  4:28 AM   Specimen: Peripheral; Nasal Swab  Result Value Ref Range Status   SARS Coronavirus 2 by RT PCR NEGATIVE NEGATIVE Final    Comment: (NOTE) SARS-CoV-2 target nucleic acids are NOT DETECTED.  The SARS-CoV-2 RNA is generally detectable in upper respiratory specimens during the acute phase of infection. The lowest concentration of SARS-CoV-2 viral copies this assay can detect is 138 copies/mL. A negative result does not preclude SARS-Cov-2 infection and should not be used as the sole basis for treatment or other patient management decisions. A negative result may occur with  improper specimen collection/handling, submission of specimen other than nasopharyngeal swab, presence of viral mutation(s) within the areas targeted by this assay, and inadequate number of viral copies(<138 copies/mL). A negative result must be combined with clinical observations, patient history, and epidemiological information. The expected result is Negative.  Fact Sheet for Patients:  EntrepreneurPulse.com.au  Fact Sheet for Healthcare Providers:  IncredibleEmployment.be  This test is no t yet approved or cleared by the [place] FDA and  has been authorized for  detection and/or diagnosis of SARS-CoV-2 by FDA under an Emergency Use Authorization (EUA). This EUA will remain  in effect (meaning this test can be used) for the duration of the COVID-19 declaration under Section 564(b)(1) of the Act, 21 U.S.C.section 360bbb-3(b)(1), unless the authorization is terminated  or revoked sooner.       Influenza A by PCR NEGATIVE NEGATIVE Final   Influenza B by PCR NEGATIVE NEGATIVE Final    Comment: (NOTE) The Xpert Xpress SARS-CoV-2/FLU/RSV plus assay is intended as an aid in the diagnosis of influenza from Nasopharyngeal swab specimens and should not be used as a sole basis for treatment. Nasal washings and aspirates are unacceptable for Xpert Xpress SARS-CoV-2/FLU/RSV testing.  Fact Sheet for Patients: EntrepreneurPulse.com.au  Fact Sheet for Healthcare Providers: IncredibleEmployment.be  This test is not yet approved or cleared by the [place] FDA and has been authorized for detection and/or diagnosis of SARS-CoV-2 by FDA under an Emergency Use Authorization (EUA). This EUA will remain in effect (meaning this test can be used) for the duration of the COVID-19 declaration under Section 564(b)(1) of the Act, 21 U.S.C. section 360bbb-3(b)(1), unless the authorization is terminated or revoked.  Performed at [facility], [place] [address] [place], [place] [address]   Blood Culture (routine x 2)     Status: None (Preliminary result)   Collection Time: 06/19/22  4:30 AM   Specimen: BLOOD RIGHT WRIST  Result Value Ref Range Status   Specimen Description BLOOD RIGHT WRIST  Final   Special Requests   Final    BOTTLES DRAWN AEROBIC AND ANAEROBIC Blood Culture adequate volume   Culture   Final    NO GROWTH 4 DAYS Performed at [facility], [place] [address], [place], [place] [address]    Report Status PENDING  Incomplete  Blood Culture (routine x 2)     Status: None (Preliminary result)    Collection Time: 06/19/22  4:36 AM   Specimen: BLOOD LEFT HAND  Result Value Ref Range Status   Specimen Description BLOOD LEFT HAND  Final   Special Requests   Final    BOTTLES DRAWN AEROBIC AND ANAEROBIC Blood Culture results may not be optimal due  to an inadequate volume of blood received in culture bottles   Culture   Final    NO GROWTH 4 DAYS Performed at [facility], [place] [address], [place], [place] [address]    Report Status PENDING  Incomplete  Urine Culture     Status: Abnormal   Collection Time: 06/19/22  4:54 AM   Specimen: In/Out Cath Urine  Result Value Ref Range Status   Specimen Description IN/OUT CATH URINE  Final   Special Requests   Final    NONE Performed at [facility], [place] [address], [place], [place] [address]    Culture >=100,000 COLONIES/mL ESCHERICHIA COLI (A)  Final   Report Status 06/21/2022 FINAL  Final   Organism ID, Bacteria ESCHERICHIA COLI (A)  Final      Susceptibility   Escherichia coli - MIC*    AMPICILLIN <=2 SENSITIVE Sensitive     CEFAZOLIN <=4 SENSITIVE Sensitive     CEFEPIME <=0.12 SENSITIVE Sensitive     CEFTRIAXONE <=0.25 SENSITIVE Sensitive     CIPROFLOXACIN <=0.25 SENSITIVE Sensitive     GENTAMICIN <=1 SENSITIVE Sensitive     IMIPENEM <=0.25 SENSITIVE Sensitive     NITROFURANTOIN <=16 SENSITIVE Sensitive     TRIMETH/SULFA <=20 SENSITIVE Sensitive     AMPICILLIN/SULBACTAM <=2 SENSITIVE Sensitive     PIP/TAZO <=4 SENSITIVE Sensitive     * >=100,000 COLONIES/mL ESCHERICHIA COLI  MRSA Next Gen by PCR, Nasal     Status: None   Collection Time: 06/20/22  3:58 PM   Specimen: Nasal Mucosa; Nasal Swab  Result Value Ref Range Status   MRSA by PCR Next Gen NOT DETECTED NOT DETECTED Final    Comment: (NOTE) The GeneXpert MRSA Assay (FDA approved for NASAL specimens only), is one component of a comprehensive MRSA colonization surveillance program. It is not intended to diagnose MRSA infection nor to guide or monitor treatment  for MRSA infections. Test performance is not FDA approved in patients less than 69 years old. Performed at [facility], [place] [address] [place], [place] [address]          Radiology Studies: DG CHEST PORT 1 VIEW  Result Date: 06/23/2022 CLINICAL DATA:  Abnormal respirations EXAM: PORTABLE CHEST 1 VIEW COMPARISON:  June 20, 2022 FINDINGS: No pneumothorax. Cardiomegaly. Mild increased interstitial prominence. More focal opacity in the right base is improved in the interval. No other interval changes. IMPRESSION: 1. Findings suggest cardiomegaly with mild edema. 2. More focal opacity in the right base is improved in the interval and may represent resolving infiltrate. Recommend continued attention on follow-up. Electronically Signed   By: Dorise Bullion III M.D.   On: 06/23/2022 08:41   CARDIAC CATHETERIZATION  Result Date: 06/21/2022 CONCLUSIONS: Moderate three-vessel coronary disease involving the distal nondominant right coronary, the large first diagonal jailed by previous LAD stent, obtuse marginal and PDA disease, and ostial to proximal circumflex. Moderate pulmonary hypertension with mean PA pressure 30 mmHg, likely WHO group 2 with capillary wedge pressure of 26, LVEDP 31 mm, pulmonary vascular resistance 2.3 Wood units. RECOMMENDATIONS: Continue therapy of heart failure.        Scheduled Meds:  albuterol  2.5 mg Nebulization Q6H   aspirin EC  81 mg Oral Daily   carvedilol  6.25 mg Oral BID WC   clopidogrel  75 mg Oral Daily   docusate sodium  100 mg Oral BID   furosemide  80 mg Intravenous BID   heparin  5,000 Units  Subcutaneous Q8H   insulin aspart  0-15 Units Subcutaneous TID WC   insulin aspart  0-5 Units Subcutaneous QHS   insulin glargine-yfgn  15 Units Subcutaneous BID   isosorbide mononitrate  30 mg Oral Daily   leptospermum manuka honey  1 Application Topical Daily   levothyroxine  88 mcg Oral QHS   losartan  12.5 mg Oral Daily   rosuvastatin  20 mg Oral  Daily   sodium chloride flush  3 mL Intravenous Q12H   sodium chloride flush  3 mL Intravenous Q12H   sodium chloride flush  3 mL Intravenous Q12H   spironolactone  12.5 mg Oral Daily   Continuous Infusions:  sodium chloride     azithromycin 500 mg (06/22/22 1323)   ceFEPime (MAXIPIME) IV 2 g (06/23/22 0634)   promethazine (PHENERGAN) injection (IM or IVPB)       LOS: 4 days      Phillips Climes, MD [facility]   To contact the attending provider between 7A-7P or the covering provider during after hours 7P-7A, please log into the web site [email or web address] and access using universal [place] password for that web site. If you do not have the password, please call the hospital operator.  06/23/2022, 10:42 AM

## 2022-06-23 NOTE — Progress Notes (Signed)
Mobility Specialist Progress Note    06/23/22 1612  Mobility  Activity Dangled on edge of bed  Level of Assistance Moderate assist, patient does 50-74%  Assistive Device Other (Comment) (HHA)  LLE Weight Bearing WBAT  Activity Response Tolerated well  $Mobility charge 1 Mobility   Pre-Mobility: 70 HR, 95% SpO2 Post-Mobility: 70 HR, 95% SpO2  Pt received and required max encouragement. C/o fatigue and pain. Able to sit ~5 minutes for grooming. Returned to supine w/ maxA and left with call bell in reach.   Hildred Alamin Mobility Specialist

## 2022-06-23 NOTE — Progress Notes (Signed)
Pharmacy Antibiotic Note  Danielle Murray is a 81 y.o. female admitted on 06/19/2022 with pneumonia.  Pharmacy has been consulted for cefepime dosing.  Currently on day #5 of antibiotics. WBC WNL, Scr 1.22 (CrCl 38 mL/min). PCT 26.12>10.56. Afebrile.  Plan: Azithromycin 500 mg qd  Cefepime 2g IV q12 Plan for 7 days total of cefepime    Height: '5\' 4"'$  (162.6 cm) Weight: 84.8 kg (187 lb) IBW/kg (Calculated) : 54.7  Temp (24hrs), Avg:97.9 F (36.6 C), Min:97.6 F (36.4 C), Max:98.5 F (36.9 C)  Recent Labs  Lab 06/19/22 0430 06/19/22 0442 06/19/22 0641 06/20/22 0056 06/20/22 1500 06/20/22 2348 06/21/22 0642 06/22/22 0049 06/23/22 0025  WBC 12.9*  --   --  13.7*  --  12.1* 11.8* 9.8 8.8  CREATININE 1.45*   < >  --  1.29* 1.23* 1.25* 1.29* 1.27* 1.22*  LATICACIDVEN 1.2  --  1.1  --   --   --   --   --   --    < > = values in this interval not displayed.     Estimated Creatinine Clearance: 38.7 mL/min (A) (by C-G formula based on SCr of 1.22 mg/dL (H)).    Allergies  Allergen Reactions   Penicillins     Diffuse joint pain @ age 50 in context of Scarlet Fever  Did it involve swelling of the face/tongue/throat, SOB, or low BP? No Did it involve sudden or severe rash/hives, skin peeling, or any reaction on the inside of your mouth or nose? No Did you need to seek medical attention at a hospital or doctor's office? No When did it last happen?  childhood     If all above answers are "NO", may proceed with cephalosporin use.      Antimicrobials this admission: 8/8 vanc x1 8/8 cefepime>> 8/9 ceftriaxone x1 8/9 azithromycin x5  Dose adjustments this admission:  Microbiology results: MRSA PCR pend 8/8 blood>>ngtd  Antonietta Jewel, PharmD, BCCCP Clinical Pharmacist  Phone: [phone] 06/23/2022 3:01 PM  Please check AMION for all [place] phone numbers After 10:00 PM, call [place] [phone]

## 2022-06-23 NOTE — Progress Notes (Signed)
Cardiology Progress Note  Patient ID: Danielle Murray MRN: [id number] DOB: 03-13-1941 Date of Encounter: 06/23/2022  Primary Cardiologist: Jenkins Rouge, MD  Subjective   Chief Complaint: SOB  HPI: Chest x-ray with pulmonary edema.  Still requiring oxygen.  Appears volume up.  ROS:  All other ROS reviewed and negative. Pertinent positives noted in the HPI.     Inpatient Medications  Scheduled Meds:  albuterol  2.5 mg Nebulization Q6H   aspirin EC  81 mg Oral Daily   carvedilol  6.25 mg Oral BID WC   docusate sodium  100 mg Oral BID   furosemide  60 mg Intravenous BID   heparin  5,000 Units Subcutaneous Q8H   insulin aspart  0-15 Units Subcutaneous TID WC   insulin aspart  0-5 Units Subcutaneous QHS   insulin glargine-yfgn  15 Units Subcutaneous BID   isosorbide mononitrate  30 mg Oral Daily   leptospermum manuka honey  1 Application Topical Daily   levothyroxine  88 mcg Oral QHS   losartan  12.5 mg Oral Daily   rosuvastatin  20 mg Oral Daily   sodium chloride flush  3 mL Intravenous Q12H   sodium chloride flush  3 mL Intravenous Q12H   sodium chloride flush  3 mL Intravenous Q12H   spironolactone  12.5 mg Oral Daily   Continuous Infusions:  sodium chloride     azithromycin 500 mg (06/22/22 1323)   ceFEPime (MAXIPIME) IV 2 g (06/23/22 0634)   promethazine (PHENERGAN) injection (IM or IVPB)     PRN Meds: sodium chloride, acetaminophen **OR** acetaminophen, bisacodyl, guaiFENesin, hydrALAZINE, morphine injection, ondansetron **OR** ondansetron (ZOFRAN) IV, mouth rinse, oxyCODONE, polyethylene glycol, simethicone, sodium chloride flush   Vital Signs   Vitals:   06/23/22 0258 06/23/22 0600 06/23/22 0710 06/23/22 0742  BP:   (!) 132/48   Pulse:   65   Resp:  20 18   Temp:   97.6 F (36.4 C)   TempSrc:   Oral   SpO2: 95% 96% 96% 97%  Weight:      Height:        Intake/Output Summary (Last 24 hours) at 06/23/2022 0945 Last data filed at 06/23/2022 0300 Gross per 24  hour  Intake 590 ml  Output 1450 ml  Net -860 ml      06/19/2022    4:26 AM 06/02/2022    9:29 AM 06/01/2022    8:44 PM  Last 3 Weights  Weight (lbs) 187 lb 185 lb 180 lb  Weight (kg) 84.823 kg 83.915 kg 81.647 kg      Telemetry  Overnight telemetry shows sinus rhythm in the 60s, which I personally reviewed.   ECG  The most recent ECG shows sinus rhythm heart rate 71, no acute ischemic changes, old inferior infarct, which I personally reviewed.   Physical Exam   Vitals:   06/23/22 0258 06/23/22 0600 06/23/22 0710 06/23/22 0742  BP:   (!) 132/48   Pulse:   65   Resp:  20 18   Temp:   97.6 F (36.4 C)   TempSrc:   Oral   SpO2: 95% 96% 96% 97%  Weight:      Height:        Intake/Output Summary (Last 24 hours) at 06/23/2022 0945 Last data filed at 06/23/2022 0300 Gross per 24 hour  Intake 590 ml  Output 1450 ml  Net -860 ml       06/19/2022    4:26 AM 06/02/2022  9:29 AM 06/01/2022    8:44 PM  Last 3 Weights  Weight (lbs) 187 lb 185 lb 180 lb  Weight (kg) 84.823 kg 83.915 kg 81.647 kg    Body mass index is 32.1 kg/m.   General: Well nourished, well developed, in no acute distress Head: Atraumatic, normal size  Eyes: PEERLA, EOMI  Neck: Supple, JVD 10-12 cmH2O Endocrine: No thryomegaly Cardiac: Normal S1, S2; RRR; no murmurs, rubs, or gallops Lungs: Diminished breath sounds bilaterally Abd: Soft, nontender, no hepatomegaly  Ext: No edema, pulses 2+ Musculoskeletal: No deformities, BUE and BLE strength normal and equal Skin: Warm and dry, no rashes   Neuro: Alert and oriented to person, place, time, and situation, CNII-XII grossly intact, no focal deficits  Psych: Normal mood and affect   Labs  High Sensitivity Troponin:   Recent Labs  Lab 06/19/22 0430 06/19/22 0641  TROPONINIHS 2,421* 2,139*     Cardiac EnzymesNo results for input(s): "TROPONINI" in the last 168 hours. No results for input(s): "TROPIPOC" in the last 168 hours.  Chemistry Recent Labs   Lab 06/19/22 0430 06/19/22 0442 06/21/22 0642 06/21/22 1526 06/21/22 1531 06/22/22 0049 06/23/22 0025  NA 134*   < > 134*   < > 129* 134* 136  K 3.7   < > 4.0   < > 4.0 4.2 3.9  CL 102   < > 107  --   --  109 107  CO2 17*   < > 17*  --   --  16* 19*  GLUCOSE 180*   < > 130*  --   --  130* 163*  BUN 36*   < > 37*  --   --  38* 42*  CREATININE 1.45*   < > 1.29*  --   --  1.27* 1.22*  CALCIUM 8.1*   < > 8.0*  --   --  7.9* 8.2*  PROT 6.3*  --   --   --   --   --   --   ALBUMIN 2.5*  --   --   --   --   --   --   AST 22  --   --   --   --   --   --   ALT 12  --   --   --   --   --   --   ALKPHOS 111  --   --   --   --   --   --   BILITOT 1.7*  --   --   --   --   --   --   GFRNONAA 36*   < > 42*  --   --  43* 45*  ANIONGAP 15   < > 10  --   --  9 10   < > = values in this interval not displayed.    Hematology Recent Labs  Lab 06/21/22 8315 06/21/22 1526 06/21/22 1531 06/22/22 0049 06/23/22 0025  WBC 11.8*  --   --  9.8 8.8  RBC 3.29*  --   --  3.25* 3.28*  HGB 9.3*   < > 10.2* 9.0* 9.3*  HCT 28.9*   < > 30.0* 29.0* 28.4*  MCV 87.8  --   --  89.2 86.6  MCH 28.3  --   --  27.7 28.4  MCHC 32.2  --   --  31.0 32.7  RDW 14.7  --   --  14.7 14.6  PLT 295  --   --  298 332   < > = values in this interval not displayed.   BNP Recent Labs  Lab 06/19/22 0921  BNP 1,579.3*    DDimer No results for input(s): "DDIMER" in the last 168 hours.   Radiology  DG CHEST PORT 1 VIEW  Result Date: 06/23/2022 CLINICAL DATA:  Abnormal respirations EXAM: PORTABLE CHEST 1 VIEW COMPARISON:  June 20, 2022 FINDINGS: No pneumothorax. Cardiomegaly. Mild increased interstitial prominence. More focal opacity in the right base is improved in the interval. No other interval changes. IMPRESSION: 1. Findings suggest cardiomegaly with mild edema. 2. More focal opacity in the right base is improved in the interval and may represent resolving infiltrate. Recommend continued attention on follow-up.  Electronically Signed   By: Dorise Bullion III M.D.   On: 06/23/2022 08:41   CARDIAC CATHETERIZATION  Result Date: 06/21/2022 CONCLUSIONS: Moderate three-vessel coronary disease involving the distal nondominant right coronary, the large first diagonal jailed by previous LAD stent, obtuse marginal and PDA disease, and ostial to proximal circumflex. Moderate pulmonary hypertension with mean PA pressure 30 mmHg, likely WHO group 2 with capillary wedge pressure of 26, LVEDP 31 mm, pulmonary vascular resistance 2.3 Wood units. RECOMMENDATIONS: Continue therapy of heart failure.    Cardiac Studies   LHC 06/21/2022 Moderate three-vessel coronary disease involving the distal nondominant right coronary, the large first diagonal jailed by previous LAD stent, obtuse marginal and PDA disease, and ostial to proximal circumflex. Moderate pulmonary hypertension with mean PA pressure 30 mmHg, likely WHO group 2 with capillary wedge pressure of 26, LVEDP 31 mm, pulmonary vascular resistance 2.3 Wood units.  TTE 06/19/2022  1. No left ventricular thrombus is seen (Definity contrast was used).  Wall motion suggests infarction due to occlusion of the mid-LAD artery or  a large ramus intermedius vessel that reaches the lateral apex. Left  ventricular ejection fraction, by  estimation, is 35 to 40%. The left ventricle has moderately decreased  function. The left ventricle demonstrates regional wall motion  abnormalities (see scoring diagram/findings for description). There is  mild concentric left ventricular hypertrophy. Left   ventricular diastolic parameters are consistent with Grade II diastolic  dysfunction (pseudonormalization). Elevated left atrial pressure.   2. Right ventricular systolic function is normal. The right ventricular  size is normal. There is normal pulmonary artery systolic pressure. The  estimated right ventricular systolic pressure is 34.1 mmHg.   3. Left atrial size was severely dilated.    4. The mitral valve is degenerative. Mild to moderate mitral valve  regurgitation. No evidence of mitral stenosis. The mean mitral valve  gradient is 3.1 mmHg. Moderate mitral annular calcification.   5. Tricuspid valve regurgitation is mild to moderate.   6. The aortic valve is tricuspid. Aortic valve regurgitation is not  visualized. No aortic stenosis is present.   Patient Profile  Danielle Murray is a 81 y.o. female with CAD, diabetes, hypertension, SVT who was admitted on 06/19/2022 with acute proximal respiratory failure secondary to pneumonia.  Course complicated by non-STEMI.  Assessment & Plan   #Non-STEMI -Admitted to the hospital with pneumonia.  Found to have non-STEMI.  Ejection fraction is 35-40% with regional wall motion abnormalities. -Underwent invasive angiography.  70% nondominant RCA, 70% proximal circumflex which is dominant, 90% jailed diagonal branch at the place of prior LAD stent, 30% stenosis in the LAD -There was discussion yesterday about possible intervention to the circumflex.  I think she would benefit from  this.  Her wall motion abnormality is in the anterior lateral segments.  She likely would benefit from this.  We will tentatively plan to do this closer to discharge.  We will have a discussion with interventional cardiology on Monday. -She has completed 48 hours of heparin.  Continue aspirin and statin.  We will optimize her heart failure regimen. -I have added Plavix as well. -Recent anemia noted due to recent hip fracture surgery.  No signs of bleeding.  Okay to continue DAPT.  #Acute systolic heart failure, EF 35-40% with wall motion abnormalities -Still appears volume up.  Increase Lasix to 80 mg IV twice daily.  Wedge pressure was 26 at time of cath.  Net -1.3 L since admission.  She still has a ways to go. -Continue Coreg 6.25 mg twice daily, Aldactone 12.5 mg daily.  I have added losartan 12.5 mg daily. -Add SGLT2 inhibitor as we are able. -She will  benefit from revascularization.  We will discuss with interventional cardiology about intervention to the circumflex.  #Acute hypoxic respiratory failure secondary to pneumonia and congestive heart failure -Procalcitonin rules then infection.  We will continue with antibiotics.  Seems to be improving.  Diuresis as above.  #Anemia #Recent hip fracture -Would maintain hemoglobin value around 8.  10 is a bit too much. -No signs of bleeding.  #CKD IIIa -stable    For questions or updates, please contact [place] Please consult [email or web address] for contact info under     Signed, [place] T. Audie Box, MD, [place]  06/23/2022 9:45 AM

## 2022-06-24 DIAGNOSIS — I5043 Acute on chronic combined systolic (congestive) and diastolic (congestive) heart failure: Secondary | ICD-10-CM | POA: Diagnosis not present

## 2022-06-24 DIAGNOSIS — J189 Pneumonia, unspecified organism: Secondary | ICD-10-CM | POA: Diagnosis not present

## 2022-06-24 DIAGNOSIS — I214 Non-ST elevation (NSTEMI) myocardial infarction: Secondary | ICD-10-CM | POA: Diagnosis not present

## 2022-06-24 DIAGNOSIS — I2510 Atherosclerotic heart disease of native coronary artery without angina pectoris: Secondary | ICD-10-CM | POA: Diagnosis not present

## 2022-06-24 LAB — CBC
HCT: 31 % — ABNORMAL LOW (ref 36.0–46.0)
Hemoglobin: 10.1 g/dL — ABNORMAL LOW (ref 12.0–15.0)
MCH: 28.2 pg (ref 26.0–34.0)
MCHC: 32.6 g/dL (ref 30.0–36.0)
MCV: 86.6 fL (ref 80.0–100.0)
Platelets: 389 10*3/uL (ref 150–400)
RBC: 3.58 MIL/uL — ABNORMAL LOW (ref 3.87–5.11)
RDW: 14.5 % (ref 11.5–15.5)
WBC: 8 10*3/uL (ref 4.0–10.5)
nRBC: 0 % (ref 0.0–0.2)

## 2022-06-24 LAB — BASIC METABOLIC PANEL
Anion gap: 10 (ref 5–15)
BUN: 41 mg/dL — ABNORMAL HIGH (ref 8–23)
CO2: 21 mmol/L — ABNORMAL LOW (ref 22–32)
Calcium: 8.7 mg/dL — ABNORMAL LOW (ref 8.9–10.3)
Chloride: 106 mmol/L (ref 98–111)
Creatinine, Ser: 1.21 mg/dL — ABNORMAL HIGH (ref 0.44–1.00)
GFR, Estimated: 45 mL/min — ABNORMAL LOW (ref >60)
Glucose, Bld: 182 mg/dL — ABNORMAL HIGH (ref 70–99)
Potassium: 3.7 mmol/L (ref 3.5–5.1)
Sodium: 137 mmol/L (ref 135–145)

## 2022-06-24 LAB — GLUCOSE, CAPILLARY
Glucose-Capillary: 226 mg/dL — ABNORMAL HIGH (ref 70–99)
Glucose-Capillary: 207 mg/dL — ABNORMAL HIGH (ref 70–99)
Glucose-Capillary: 227 mg/dL — ABNORMAL HIGH (ref 70–99)

## 2022-06-24 LAB — CULTURE, BLOOD (ROUTINE X 2)
Culture: NO GROWTH
Culture: NO GROWTH
Special Requests: ADEQUATE

## 2022-06-24 MED ORDER — POTASSIUM CHLORIDE CRYS ER 10 MEQ PO TBCR
30.0000 meq | EXTENDED_RELEASE_TABLET | Freq: Once | ORAL | Status: AC
Start: 2022-06-24 — End: 2022-06-24
  Administered 2022-06-24: 30 meq via ORAL
  Filled 2022-06-24: qty 1

## 2022-06-24 MED ORDER — SODIUM CHLORIDE 0.9 % IV SOLN: INTRAVENOUS | Status: DC

## 2022-06-24 MED ORDER — ASPIRIN 81 MG PO CHEW
81.0000 mg | CHEWABLE_TABLET | ORAL | Status: AC
  Administered 2022-06-25: 81 mg via ORAL
  Filled 2022-06-24: qty 1

## 2022-06-24 MED ORDER — MELATONIN 3 MG PO TABS
3.0000 mg | ORAL_TABLET | Freq: Every evening | ORAL | Status: DC | PRN
  Administered 2022-06-26 – 2022-06-27 (×2): 3 mg via ORAL
  Filled 2022-06-24 (×3): qty 1

## 2022-06-24 NOTE — Evaluation (Signed)
Physical Therapy Evaluation Patient Details Name: Danielle Murray MRN: [id number] DOB: 24-May-1941 Today's Date: 06/24/2022  History of Present Illness  81 y.o. female presents to [facility] hospital on 06/19/2022 with fever, SOB, and elevated troponins. Pt admitted fro management of NSTEMI and sepsis 2/2 PNA. Pt recently admitted 7/21-25 for hip fracture repair. PMH includes OA, CAD, depression, HTN, HLD, DMII.  Clinical Impression  Pt presents to PT with deficits in functional mobility, gait, balance, strength, power, endurance. Pt is anxious during session and requires encouragement initially to mobilize. Pt is reliant on physical assistance to perform bed mobility, initially with a posterior lean when sitting. Pt is at a high falls risk due to LE and generalized weakness, as well as imbalance. PT recommends return to SNF for further therapies once medically ready to discharge as pt was having success at [facility] prior to this admission.       Recommendations for follow up therapy are one component of a multi-disciplinary discharge planning process, led by the attending physician.  Recommendations may be updated based on patient status, additional functional criteria and insurance authorization.  Follow Up Recommendations Skilled nursing-short term rehab (<3 hours/day) Can patient physically be transported by private vehicle: No    Assistance Recommended at Discharge Intermittent Supervision/Assistance  Patient can return home with the following  A lot of help with walking and/or transfers;A lot of help with bathing/dressing/bathroom;Assistance with cooking/housework;Assist for transportation;Help with stairs or ramp for entrance    Equipment Recommendations Wheelchair (measurements PT);BSC/3in1  Recommendations for Other Services       Functional Status Assessment Patient has had a recent decline in their functional status and demonstrates the ability to make significant improvements in  function in a reasonable and predictable amount of time.     Precautions / Restrictions Precautions Precautions: Fall Restrictions Weight Bearing Restrictions: Yes LLE Weight Bearing: Weight bearing as tolerated      Mobility  Bed Mobility Overal bed mobility: Needs Assistance Bed Mobility: Supine to Sit, Sit to Supine, Rolling Rolling: Min assist   Supine to sit: Mod assist Sit to supine: Min assist        Transfers Overall transfer level: Needs assistance Equipment used: 1 person hand held assist Transfers: Sit to/from Stand Sit to Stand: Mod assist           General transfer comment: face to face transfer, pt declines attempts at pivoting to recliner    Ambulation/Gait Ambulation/Gait assistance:  (pt decines attempts at ambulation)                Stairs            Wheelchair Mobility    Modified Rankin (Stroke Patients Only)       Balance Overall balance assessment: Needs assistance Sitting-balance support: Single extremity supported, Feet supported Sitting balance-Leahy Scale: Poor     Standing balance support: Bilateral upper extremity supported Standing balance-Leahy Scale: Poor                               Pertinent Vitals/Pain Pain Assessment Pain Assessment: Faces Faces Pain Scale: Hurts little more Pain Location: L hip Pain Descriptors / Indicators: Grimacing Pain Intervention(s): Monitored during session    Home Living Family/patient expects to be discharged to:: Skilled nursing facility Living Arrangements: Spouse/significant other Available Help at Discharge: Family;Available 24 hours/day Type of Home: House Home Access: Stairs to enter Entrance Stairs-Rails: Can reach both Entrance Stairs-Number  of Steps: 1   Home Layout: One level Home Equipment: Conservation officer, nature (2 wheels);Rollator (4 wheels) Additional Comments: hopeful to return to [facility]    Prior Function Prior Level of Function :  Independent/Modified Independent             Mobility Comments: pt reports she had progressed to ambulating with a RW for ~60 steps at SNF since femur fx       Hand Dominance   Dominant Hand: Right    Extremity/Trunk Assessment   Upper Extremity Assessment Upper Extremity Assessment: Generalized weakness    Lower Extremity Assessment Lower Extremity Assessment: Generalized weakness    Cervical / Trunk Assessment Cervical / Trunk Assessment: Kyphotic  Communication   Communication: No difficulties  Cognition Arousal/Alertness: Awake/alert Behavior During Therapy: Anxious Overall Cognitive Status: Within Functional Limits for tasks assessed                                 General Comments: pt expresses a fear of passing out and a fear of falling        General Comments General comments (skin integrity, edema, etc.): VSS on 2L [place], BP stable, pt reports lightheadedness in sitting    Exercises     Assessment/Plan    PT Assessment Patient needs continued PT services  PT Problem List Decreased activity tolerance;Decreased strength;Decreased balance;Decreased mobility;Decreased range of motion;Decreased knowledge of use of DME;Decreased safety awareness;Decreased knowledge of precautions;Cardiopulmonary status limiting activity;Pain       PT Treatment Interventions DME instruction;Gait training;Stair training;Functional mobility training;Therapeutic activities;Therapeutic exercise;Balance training;Neuromuscular re-education;Cognitive remediation;Patient/family education    PT Goals (Current goals can be found in the Care Plan section)  Acute Rehab PT Goals Patient Stated Goal: to return to SNF and continue progression back to independence PT Goal Formulation: With patient Time For Goal Achievement: 07/08/22 Potential to Achieve Goals: Fair    Frequency Min 3X/week     Co-evaluation               AM-PAC PT "6 Clicks" Mobility  Outcome  Measure Help needed turning from your back to your side while in a flat bed without using bedrails?: A Little Help needed moving from lying on your back to sitting on the side of a flat bed without using bedrails?: A Lot Help needed moving to and from a bed to a chair (including a wheelchair)?: A Lot Help needed standing up from a chair using your arms (e.g., wheelchair or bedside chair)?: A Lot Help needed to walk in hospital room?: Total Help needed climbing 3-5 steps with a railing? : Total 6 Click Score: 11    End of Session Equipment Utilized During Treatment: Oxygen Activity Tolerance: Patient tolerated treatment well Patient left: in bed;with call bell/phone within reach;with bed alarm set;with family/visitor present Nurse Communication: Mobility status PT Visit Diagnosis: Unsteadiness on feet (R26.81);Pain;Difficulty in walking, not elsewhere classified (R26.2) Pain - Right/Left: Left Pain - part of body: Hip;Leg    Time: 9476-5465 PT Time Calculation (min) (ACUTE ONLY): 20 min   Charges:   PT Evaluation $PT Eval Low Complexity: [place], PT, DPT Acute Rehabilitation Office [phone]   Zenaida Niece 06/24/2022, 6:08 PM

## 2022-06-24 NOTE — Progress Notes (Signed)
TR[facility] night cross cover note:   I was notified by RN of the patient's request for a sleep aid. I subsequently placed order for prn melatonin for insomnia.     Oiva Dibari, DO Hospitalist  

## 2022-06-24 NOTE — Progress Notes (Signed)
Cardiology Progress Note  Patient ID: ISHITA MCNERNEY MRN: [id number] DOB: Feb 06, 1941 Date of Encounter: 06/24/2022  Primary Cardiologist: Jenkins Rouge, MD  Subjective   Chief Complaint: None.   HPI: Good diuresis.  Breathing still not back to baseline.  No chest pain.  ROS:  All other ROS reviewed and negative. Pertinent positives noted in the HPI.     Inpatient Medications  Scheduled Meds:  albuterol  2.5 mg Nebulization TID   aspirin EC  81 mg Oral Daily   carvedilol  6.25 mg Oral BID WC   clopidogrel  75 mg Oral Daily   docusate sodium  100 mg Oral BID   furosemide  80 mg Intravenous BID   heparin  5,000 Units Subcutaneous Q8H   insulin aspart  0-15 Units Subcutaneous TID WC   insulin aspart  0-5 Units Subcutaneous QHS   insulin glargine-yfgn  15 Units Subcutaneous BID   isosorbide mononitrate  30 mg Oral Daily   leptospermum manuka honey  1 Application Topical Daily   levothyroxine  88 mcg Oral QHS   losartan  12.5 mg Oral Daily   rosuvastatin  20 mg Oral Daily   sodium chloride flush  3 mL Intravenous Q12H   sodium chloride flush  3 mL Intravenous Q12H   sodium chloride flush  3 mL Intravenous Q12H   spironolactone  12.5 mg Oral Daily   Continuous Infusions:  sodium chloride     azithromycin Stopped (06/23/22 1818)   ceFEPime (MAXIPIME) IV 2 g (06/24/22 2505)   promethazine (PHENERGAN) injection (IM or IVPB)     PRN Meds: sodium chloride, acetaminophen **OR** acetaminophen, albuterol, bisacodyl, guaiFENesin, hydrALAZINE, morphine injection, ondansetron **OR** ondansetron (ZOFRAN) IV, mouth rinse, oxyCODONE, polyethylene glycol, simethicone, sodium chloride flush   Vital Signs   Vitals:   06/24/22 0347 06/24/22 0715 06/24/22 0753 06/24/22 0811  BP: (!) 132/53  (!) 125/44   Pulse:   63 62  Resp:   (!) 22 (!) 24  Temp: 97.9 F (36.6 C)  (!) 97.4 F (36.3 C)   TempSrc: Oral  Oral   SpO2:  97% 97% 97%  Weight:      Height:        Intake/Output Summary  (Last 24 hours) at 06/24/2022 0834 Last data filed at 06/24/2022 0800 Gross per 24 hour  Intake 1016.23 ml  Output 2950 ml  Net -1933.77 ml      06/19/2022    4:26 AM 06/02/2022    9:29 AM 06/01/2022    8:44 PM  Last 3 Weights  Weight (lbs) 187 lb 185 lb 180 lb  Weight (kg) 84.823 kg 83.915 kg 81.647 kg      Telemetry  Overnight telemetry shows sinus rhythm in the 60s, which I personally reviewed.   ECG  The most recent ECG shows sinus rhythm heart rate 77, nonspecific ST-T changes, old anteroseptal infarct, which I personally reviewed.   Physical Exam   Vitals:   06/24/22 0347 06/24/22 0715 06/24/22 0753 06/24/22 0811  BP: (!) 132/53  (!) 125/44   Pulse:   63 62  Resp:   (!) 22 (!) 24  Temp: 97.9 F (36.6 C)  (!) 97.4 F (36.3 C)   TempSrc: Oral  Oral   SpO2:  97% 97% 97%  Weight:      Height:        Intake/Output Summary (Last 24 hours) at 06/24/2022 0834 Last data filed at 06/24/2022 0800 Gross per 24 hour  Intake 1016.23 ml  Output 2950 ml  Net -1933.77 ml       06/19/2022    4:26 AM 06/02/2022    9:29 AM 06/01/2022    8:44 PM  Last 3 Weights  Weight (lbs) 187 lb 185 lb 180 lb  Weight (kg) 84.823 kg 83.915 kg 81.647 kg    Body mass index is 32.1 kg/m.  General: Well nourished, well developed, in no acute distress Head: Atraumatic, normal size  Eyes: PEERLA, EOMI  Neck: Supple, no JVD Endocrine: No thryomegaly Cardiac: Normal S1, S2; RRR; no murmurs, rubs, or gallops Lungs: Diminished breath sounds bilaterally Abd: Soft, nontender, no hepatomegaly  Ext: No edema, pulses 2+ Musculoskeletal: No deformities, BUE and BLE strength normal and equal Skin: Warm and dry, no rashes   Neuro: Alert and oriented to person, place, time, and situation, CNII-XII grossly intact, no focal deficits  Psych: Normal mood and affect   Labs  High Sensitivity Troponin:   Recent Labs  Lab 06/19/22 0430 06/19/22 0641  TROPONINIHS 2,421* 2,139*     Cardiac EnzymesNo results  for input(s): "TROPONINI" in the last 168 hours. No results for input(s): "TROPIPOC" in the last 168 hours.  Chemistry Recent Labs  Lab 06/19/22 0430 06/19/22 0442 06/22/22 0049 06/23/22 0025 06/24/22 0017  NA 134*   < > 134* 136 137  K 3.7   < > 4.2 3.9 3.7  CL 102   < > 109 107 106  CO2 17*   < > 16* 19* 21*  GLUCOSE 180*   < > 130* 163* 182*  BUN 36*   < > 38* 42* 41*  CREATININE 1.45*   < > 1.27* 1.22* 1.21*  CALCIUM 8.1*   < > 7.9* 8.2* 8.7*  PROT 6.3*  --   --   --   --   ALBUMIN 2.5*  --   --   --   --   AST 22  --   --   --   --   ALT 12  --   --   --   --   ALKPHOS 111  --   --   --   --   BILITOT 1.7*  --   --   --   --   GFRNONAA 36*   < > 43* 45* 45*  ANIONGAP 15   < > '9 10 10   '$ < > = values in this interval not displayed.    Hematology Recent Labs  Lab 06/22/22 0049 06/23/22 0025 06/24/22 0017  WBC 9.8 8.8 8.0  RBC 3.25* 3.28* 3.58*  HGB 9.0* 9.3* 10.1*  HCT 29.0* 28.4* 31.0*  MCV 89.2 86.6 86.6  MCH 27.7 28.4 28.2  MCHC 31.0 32.7 32.6  RDW 14.7 14.6 14.5  PLT 298 332 389   BNP Recent Labs  Lab 06/19/22 0921  BNP 1,579.3*    DDimer No results for input(s): "DDIMER" in the last 168 hours.   Radiology  DG CHEST PORT 1 VIEW  Result Date: 06/23/2022 CLINICAL DATA:  Abnormal respirations EXAM: PORTABLE CHEST 1 VIEW COMPARISON:  June 20, 2022 FINDINGS: No pneumothorax. Cardiomegaly. Mild increased interstitial prominence. More focal opacity in the right base is improved in the interval. No other interval changes. IMPRESSION: 1. Findings suggest cardiomegaly with mild edema. 2. More focal opacity in the right base is improved in the interval and may represent resolving infiltrate. Recommend continued attention on follow-up. Electronically Signed   By: Dorise Bullion III M.D.   On: 06/23/2022  08:41    Cardiac Studies  TTE 06/19/2022  1. No left ventricular thrombus is seen (Definity contrast was used).  Wall motion suggests infarction due to occlusion  of the mid-LAD artery or  a large ramus intermedius vessel that reaches the lateral apex. Left  ventricular ejection fraction, by  estimation, is 35 to 40%. The left ventricle has moderately decreased  function. The left ventricle demonstrates regional wall motion  abnormalities (see scoring diagram/findings for description). There is  mild concentric left ventricular hypertrophy. Left   ventricular diastolic parameters are consistent with Grade II diastolic  dysfunction (pseudonormalization). Elevated left atrial pressure.   2. Right ventricular systolic function is normal. The right ventricular  size is normal. There is normal pulmonary artery systolic pressure. The  estimated right ventricular systolic pressure is 50.0 mmHg.   3. Left atrial size was severely dilated.   4. The mitral valve is degenerative. Mild to moderate mitral valve  regurgitation. No evidence of mitral stenosis. The mean mitral valve  gradient is 3.1 mmHg. Moderate mitral annular calcification.   5. Tricuspid valve regurgitation is mild to moderate.   6. The aortic valve is tricuspid. Aortic valve regurgitation is not  visualized. No aortic stenosis is present.  LHC 06/21/2022 CONCLUSIONS: Moderate three-vessel coronary disease involving the distal nondominant right coronary, the large first diagonal jailed by previous LAD stent, obtuse marginal and PDA disease, and ostial to proximal circumflex. Moderate pulmonary hypertension with mean PA pressure 30 mmHg, likely WHO group 2 with capillary wedge pressure of 26, LVEDP 31 mm, pulmonary vascular resistance 2.3 Wood units.   RECOMMENDATIONS:   Continue therapy of heart failure.  Patient Profile  MYCALA WARSHAWSKY is a 81 y.o. female with CAD, diabetes, hypertension, SVT who was admitted on 06/19/2022 with acute proximal respiratory failure secondary to pneumonia.  Course complicated by non-STEMI.  Assessment & Plan   #Non-STEMI -Admitted to the hospital with  pneumonia.  Had a secondary non-STEMI.  Ejection fraction 35-40% with regional wall motion abnormalities. -Left heart catheterization shows 40% nondominant RCA, 70% proximal circumflex which is dominant, 90% jailed diagonal branch at the place of the prior LAD stent and 30% in-stent restenosis of the LAD stent. -Discussion on Friday was to consider possible intervention to the left circumflex.  I think she would likely benefit from this.  Her wall motion abnormalities in the anterior lateral segments which is supplied mainly by the circumflex. -We will keep her n.p.o. at midnight.  Interventional cardiology will round on her tomorrow.  We will need a definitive plan.  She may end up with intervention tomorrow. -For now continue aspirin and statin.  She is not having any chest pain.  She is doing well. -We will continue Plavix as well. -She does have anemia from recent surgery but this appears to be stable.  #Acute systolic heart failure, EF 35-40% #Ischemic cardiomyopathy -Continue with Lasix 80 mg IV twice daily. -Continue Coreg 6.25 mg twice daily, Aldactone 12.5 mg daily.  Continue losartan 12.5 mg daily. -Add SGLT2 inhibitor closer to discharge. -Plans for possible revascularization tomorrow.  N.p.o. at midnight.  #Acute hypoxic respiratory failure secondary to pneumonia and congestive heart failure -Respiratory status is improving with diuresis.  Currently on antibiotics.  Overall seems to be improving.  #Anemia #Recent hip fracture -Suffered a recent hip fracture and is status post surgery.  Anemia seems to be stable.  No signs of bleeding.  #CKD 3A -Stable    For questions or updates, please contact  [facility] Please consult [email or web address] for contact info under   Signed, [place] B[place] T. Audie Box, MD, [place]  06/24/2022 8:34 AM

## 2022-06-24 NOTE — Progress Notes (Signed)
PROGRESS NOTE    Danielle Murray  JGG:[id number] DOB: 03/03/1941 DOA: 06/19/2022 PCP: [name], MD    Chief Complaint  Patient presents with   Fever    Brief Narrative:   Danielle Murray is a 81 y.o. female with medical history significant of CAD s/p stent; HTN; HLD; hypothyroidism; and DM presenting with fever. She was last admitted from 7/21-25 for hip fracture s/p repair.  She has been doing well with her rehab but became SOB a day or two ago.  She has had SOB periodically prior to last hospitalization, would have to sit down and rest with exertion.  Worse in the last 2 days significantly.  She has had mild pain in the back of her shoulder blades and pressure in her substernal region, "not real bad though."  She developed fever in the last 2 days, max 102.  No cough.  She was wheezing early this AM.  No urinary symptoms.   - her work up significant NSTEMI, and pneumonia, she is admitted for further workup.  Assessment & Plan:   Principal Problem:   NSTEMI (non-ST elevated myocardial infarction) ([place]) Active Problems:   Hypothyroidism   Essential hypertension   Uncontrolled type 2 diabetes mellitus with hyperglycemia, with long-term current use of insulin (HCC)   CAD (coronary artery disease), native coronary artery   Dyslipidemia   Status post surgery   Pressure injury of skin   Acute on chronic combined systolic and diastolic HF (heart failure) (HCC)  Pneumonia Acute hypoxemic respiratory failure -Patient presenting with cough, fever to 102, mildly decreased oxygen saturation, and infiltrate in right lower lobe on chest x-ray, as well as significantly elevated procalcitonin, she has been treated for up pneumonia. -Is a negative, vancomycin stopped, continue with cefepime and azithromycin, to finish total 7 days. -Improving  Acute systolic CHF -Echo showing drop in EF 35 to 40% with wall motion abnormalities -Continue with IV diuresis, -3.3 L over last 24 hours -Continue  with Coreg, Aldactone, and losartan  Non- stemi -Troponin significantly elevated, peaked at 2400, and EKG with acute changes, 2D echo shows decline in her EF 35 to 40%. -On heparin gtt. initially -Treatment Per cardiology, cardiac cath on 8/10 significant for moderate three-vessel coronary artery disease, she may end up with intervention tomorrow per cardiology. -Continue with Coreg, aspirin, Crestor and Imdur, started on plavix  as well.  Acute on chronic blood loss anemia -Received 1 unit PRBC 8/9, especially in the setting of symptomatic anemia and MI, hemoglobin stable.  HTN -Continue carvedilol, amlodipine -on PRN hydralazine    HLD -Continue Crestor -Check lipids   DM -A1c is 7.5, suboptimal control -Hold Jardiance -Switch 70/30 to sem-glee for now -Will cover with moderate-scale SSI for now   Hypothyroidism -Continue Synthroid   Recent hip fracture -Appears to be doing well from this standpoint -She is likely to need to return to rehab following this hospitalization     Obesity Body mass index is 32.1 kg/m.  Pressure ulcer -Present on admission -Consulted wound care  Pressure Injury 06/19/22 Buttocks Left Stage 2 -  Partial thickness loss of dermis presenting as a shallow open injury with a red, pink wound bed without slough. (Active)  06/19/22 1202  Location: Buttocks  Location Orientation: Left  Staging: Stage 2 -  Partial thickness loss of dermis presenting as a shallow open injury with a red, pink wound bed without slough.  Wound Description (Comments):   Present on Admission: Yes  DVT prophylaxis: Heparin GTT>> [place] heparin Code Status: Full Family Communication: D/W husband at bedside daily Disposition:   Status is: Inpatient    Consultants:  Cardiology PCCM  Subjective:  Patient reports she is feeling better today, no chest pain, reports dyspnea has improved  Objective: Vitals:   06/24/22 0347 06/24/22 0715 06/24/22 0753 06/24/22  0811  BP: (!) 132/53  (!) 125/44   Pulse:   63 62  Resp:   (!) 22 (!) 24  Temp: 97.9 F (36.6 C)  (!) 97.4 F (36.3 C)   TempSrc: Oral  Oral   SpO2:  97% 97% 97%  Weight:      Height:        Intake/Output Summary (Last 24 hours) at 06/24/2022 0931 Last data filed at 06/24/2022 0800 Gross per 24 hour  Intake 1016.23 ml  Output 2950 ml  Net -1933.77 ml   Filed Weights   06/19/22 0426  Weight: 84.8 kg    Examination:  Awake Alert, Oriented X 3, frail, deconditioned Symmetrical Chest wall movement,  improved air entry at the bases, but still diminished. RRR,No Gallops,Rubs or new Murmurs, No Parasternal Heave +ve B.Sounds, Abd Soft, No tenderness, No rebound - guarding or rigidity. No Cyanosis, Clubbing or edema, No new Rash or bruise          Data Reviewed: I have personally reviewed following labs and imaging studies  CBC: Recent Labs  Lab 06/19/22 0430 06/19/22 0442 06/20/22 2348 06/21/22 0642 06/21/22 1526 06/21/22 1531 06/22/22 0049 06/23/22 0025 06/24/22 0017  WBC 12.9*   < > 12.1* 11.8*  --   --  9.8 8.8 8.0  NEUTROABS 12.0*  --   --   --   --   --   --   --   --   HGB 9.7*   < > 9.5* 9.3* 10.2* 10.2* 9.0* 9.3* 10.1*  HCT 31.1*   < > 28.7* 28.9* 30.0* 30.0* 29.0* 28.4* 31.0*  MCV 90.9   < > 87.2 87.8  --   --  89.2 86.6 86.6  PLT 413*   < > 319 295  --   --  298 332 389   < > = values in this interval not displayed.    Basic Metabolic Panel: Recent Labs  Lab 06/20/22 2348 06/21/22 0642 06/21/22 1526 06/21/22 1531 06/22/22 0049 06/23/22 0025 06/24/22 0017  NA 134* 134* 137 129* 134* 136 137  K 4.0 4.0 4.2 4.0 4.2 3.9 3.7  CL 107 107  --   --  109 107 106  CO2 18* 17*  --   --  16* 19* 21*  GLUCOSE 139* 130*  --   --  130* 163* 182*  BUN 36* 37*  --   --  38* 42* 41*  CREATININE 1.25* 1.29*  --   --  1.27* 1.22* 1.21*  CALCIUM 7.9* 8.0*  --   --  7.9* 8.2* 8.7*    GFR: Estimated Creatinine Clearance: 39 mL/min (A) (by C-G formula based  on SCr of 1.21 mg/dL (H)).  Liver Function Tests: Recent Labs  Lab 06/19/22 0430  AST 22  ALT 12  ALKPHOS 111  BILITOT 1.7*  PROT 6.3*  ALBUMIN 2.5*    CBG: Recent Labs  Lab 06/23/22 0859 06/23/22 1047 06/23/22 1636 06/23/22 2123 06/24/22 0802  GLUCAP 286* 294* 199* 255* 226*     Recent Results (from the past 240 hour(s))  Resp Panel by RT-PCR (Flu A&B, Covid) Peripheral  Status: None   Collection Time: 06/19/22  4:28 AM   Specimen: Peripheral; Nasal Swab  Result Value Ref Range Status   SARS Coronavirus 2 by RT PCR NEGATIVE NEGATIVE Final    Comment: (NOTE) SARS-CoV-2 target nucleic acids are NOT DETECTED.  The SARS-CoV-2 RNA is generally detectable in upper respiratory specimens during the acute phase of infection. The lowest concentration of SARS-CoV-2 viral copies this assay can detect is 138 copies/mL. A negative result does not preclude SARS-Cov-2 infection and should not be used as the sole basis for treatment or other patient management decisions. A negative result may occur with  improper specimen collection/handling, submission of specimen other than nasopharyngeal swab, presence of viral mutation(s) within the areas targeted by this assay, and inadequate number of viral copies(<138 copies/mL). A negative result must be combined with clinical observations, patient history, and epidemiological information. The expected result is Negative.  Fact Sheet for Patients:  EntrepreneurPulse.com.au  Fact Sheet for Healthcare Providers:  IncredibleEmployment.be  This test is no t yet approved or cleared by the [place] FDA and  has been authorized for detection and/or diagnosis of SARS-CoV-2 by FDA under an Emergency Use Authorization (EUA). This EUA will remain  in effect (meaning this test can be used) for the duration of the COVID-19 declaration under Section 564(b)(1) of the Act, 21 U.S.C.section 360bbb-3(b)(1),  unless the authorization is terminated  or revoked sooner.       Influenza A by PCR NEGATIVE NEGATIVE Final   Influenza B by PCR NEGATIVE NEGATIVE Final    Comment: (NOTE) The Xpert Xpress SARS-CoV-2/FLU/RSV plus assay is intended as an aid in the diagnosis of influenza from Nasopharyngeal swab specimens and should not be used as a sole basis for treatment. Nasal washings and aspirates are unacceptable for Xpert Xpress SARS-CoV-2/FLU/RSV testing.  Fact Sheet for Patients: EntrepreneurPulse.com.au  Fact Sheet for Healthcare Providers: IncredibleEmployment.be  This test is not yet approved or cleared by the [place] FDA and has been authorized for detection and/or diagnosis of SARS-CoV-2 by FDA under an Emergency Use Authorization (EUA). This EUA will remain in effect (meaning this test can be used) for the duration of the COVID-19 declaration under Section 564(b)(1) of the Act, 21 U.S.C. section 360bbb-3(b)(1), unless the authorization is terminated or revoked.  Performed at [facility], [place] [address], [place], [place] [address]   Blood Culture (routine x 2)     Status: None   Collection Time: 06/19/22  4:30 AM   Specimen: BLOOD RIGHT WRIST  Result Value Ref Range Status   Specimen Description BLOOD RIGHT WRIST  Final   Special Requests   Final    BOTTLES DRAWN AEROBIC AND ANAEROBIC Blood Culture adequate volume   Culture   Final    NO GROWTH 5 DAYS Performed at [facility], [place] [address], [place], [place] [address]    Report Status 06/24/2022 FINAL  Final  Blood Culture (routine x 2)     Status: None   Collection Time: 06/19/22  4:36 AM   Specimen: BLOOD LEFT HAND  Result Value Ref Range Status   Specimen Description BLOOD LEFT HAND  Final   Special Requests   Final    BOTTLES DRAWN AEROBIC AND ANAEROBIC Blood Culture results may not be optimal due to an inadequate volume of blood received in culture bottles    Culture   Final    NO GROWTH 5 DAYS Performed at [facility], [place] [address]  [place], [place] [address]    Report Status 06/24/2022 FINAL  Final  Urine Culture     Status: Abnormal   Collection Time: 06/19/22  4:54 AM   Specimen: In/Out Cath Urine  Result Value Ref Range Status   Specimen Description IN/OUT CATH URINE  Final   Special Requests   Final    NONE Performed at [facility], [place] [address], [place], [place] [address]    Culture >=100,000 COLONIES/mL ESCHERICHIA COLI (A)  Final   Report Status 06/21/2022 FINAL  Final   Organism ID, Bacteria ESCHERICHIA COLI (A)  Final      Susceptibility   Escherichia coli - MIC*    AMPICILLIN <=2 SENSITIVE Sensitive     CEFAZOLIN <=4 SENSITIVE Sensitive     CEFEPIME <=0.12 SENSITIVE Sensitive     CEFTRIAXONE <=0.25 SENSITIVE Sensitive     CIPROFLOXACIN <=0.25 SENSITIVE Sensitive     GENTAMICIN <=1 SENSITIVE Sensitive     IMIPENEM <=0.25 SENSITIVE Sensitive     NITROFURANTOIN <=16 SENSITIVE Sensitive     TRIMETH/SULFA <=20 SENSITIVE Sensitive     AMPICILLIN/SULBACTAM <=2 SENSITIVE Sensitive     PIP/TAZO <=4 SENSITIVE Sensitive     * >=100,000 COLONIES/mL ESCHERICHIA COLI  MRSA Next Gen by PCR, Nasal     Status: None   Collection Time: 06/20/22  3:58 PM   Specimen: Nasal Mucosa; Nasal Swab  Result Value Ref Range Status   MRSA by PCR Next Gen NOT DETECTED NOT DETECTED Final    Comment: (NOTE) The GeneXpert MRSA Assay (FDA approved for NASAL specimens only), is one component of a comprehensive MRSA colonization surveillance program. It is not intended to diagnose MRSA infection nor to guide or monitor treatment for MRSA infections. Test performance is not FDA approved in patients less than 57 years old. Performed at [facility], [place] [address] [place], [place] [address]          Radiology Studies: DG CHEST PORT 1 VIEW  Result Date: 06/23/2022 CLINICAL DATA:  Abnormal respirations EXAM: PORTABLE CHEST  1 VIEW COMPARISON:  June 20, 2022 FINDINGS: No pneumothorax. Cardiomegaly. Mild increased interstitial prominence. More focal opacity in the right base is improved in the interval. No other interval changes. IMPRESSION: 1. Findings suggest cardiomegaly with mild edema. 2. More focal opacity in the right base is improved in the interval and may represent resolving infiltrate. Recommend continued attention on follow-up. Electronically Signed   By: Dorise Bullion III M.D.   On: 06/23/2022 08:41        Scheduled Meds:  albuterol  2.5 mg Nebulization TID   aspirin EC  81 mg Oral Daily   carvedilol  6.25 mg Oral BID WC   clopidogrel  75 mg Oral Daily   docusate sodium  100 mg Oral BID   furosemide  80 mg Intravenous BID   heparin  5,000 Units Subcutaneous Q8H   insulin aspart  0-15 Units Subcutaneous TID WC   insulin aspart  0-5 Units Subcutaneous QHS   insulin glargine-yfgn  15 Units Subcutaneous BID   isosorbide mononitrate  30 mg Oral Daily   leptospermum manuka honey  1 Application Topical Daily   levothyroxine  88 mcg Oral QHS   losartan  12.5 mg Oral Daily   rosuvastatin  20 mg Oral Daily   sodium chloride flush  3 mL Intravenous Q12H   sodium chloride flush  3 mL Intravenous Q12H   sodium chloride flush  3 mL Intravenous Q12H  spironolactone  12.5 mg Oral Daily   Continuous Infusions:  sodium chloride     azithromycin Stopped (06/23/22 1818)   ceFEPime (MAXIPIME) IV 2 g (06/24/22 5449)   promethazine (PHENERGAN) injection (IM or IVPB)       LOS: 5 days      Phillips Climes, MD [facility]   To contact the attending provider between 7A-7P or the covering provider during after hours 7P-7A, please log into the web site [email or web address] and access using universal  password for that web site. If you do not have the password, please call the hospital operator.  06/24/2022, 9:31 AM

## 2022-06-25 ENCOUNTER — Inpatient Hospital Stay (HOSPITAL_COMMUNITY)
Admission: EM | Disposition: A | Discharge status: Skilled Nursing Facility | Payer: Self-pay | Source: Home / Self Care | Attending: Internal Medicine

## 2022-06-25 ENCOUNTER — Encounter (HOSPITAL_COMMUNITY): Payer: Self-pay | Admitting: Interventional Cardiology

## 2022-06-25 DIAGNOSIS — I5043 Acute on chronic combined systolic (congestive) and diastolic (congestive) heart failure: Secondary | ICD-10-CM | POA: Diagnosis not present

## 2022-06-25 DIAGNOSIS — I214 Non-ST elevation (NSTEMI) myocardial infarction: Secondary | ICD-10-CM | POA: Diagnosis not present

## 2022-06-25 DIAGNOSIS — I2510 Atherosclerotic heart disease of native coronary artery without angina pectoris: Secondary | ICD-10-CM | POA: Diagnosis not present

## 2022-06-25 HISTORY — PX: INTRAVASCULAR ULTRASOUND/IVUS: CATH118244

## 2022-06-25 HISTORY — PX: CORONARY STENT INTERVENTION: CATH118234

## 2022-06-25 HISTORY — PX: CORONARY LITHOTRIPSY: CATH118330

## 2022-06-25 LAB — CBC
HCT: 32.8 % — ABNORMAL LOW (ref 36.0–46.0)
Hemoglobin: 10.7 g/dL — ABNORMAL LOW (ref 12.0–15.0)
MCH: 28.2 pg (ref 26.0–34.0)
MCHC: 32.6 g/dL (ref 30.0–36.0)
MCV: 86.3 fL (ref 80.0–100.0)
Platelets: 425 10*3/uL — ABNORMAL HIGH (ref 150–400)
RBC: 3.8 MIL/uL — ABNORMAL LOW (ref 3.87–5.11)
RDW: 14.4 % (ref 11.5–15.5)
WBC: 10.1 10*3/uL (ref 4.0–10.5)
nRBC: 0 % (ref 0.0–0.2)

## 2022-06-25 LAB — BASIC METABOLIC PANEL
Anion gap: 12 (ref 5–15)
BUN: 40 mg/dL — ABNORMAL HIGH (ref 8–23)
CO2: 22 mmol/L (ref 22–32)
Calcium: 8.6 mg/dL — ABNORMAL LOW (ref 8.9–10.3)
Chloride: 103 mmol/L (ref 98–111)
Creatinine, Ser: 1.19 mg/dL — ABNORMAL HIGH (ref 0.44–1.00)
GFR, Estimated: 46 mL/min — ABNORMAL LOW (ref >60)
Glucose, Bld: 194 mg/dL — ABNORMAL HIGH (ref 70–99)
Potassium: 3.8 mmol/L (ref 3.5–5.1)
Sodium: 137 mmol/L (ref 135–145)

## 2022-06-25 LAB — GLUCOSE, CAPILLARY
Glucose-Capillary: 152 mg/dL — ABNORMAL HIGH (ref 70–99)
Glucose-Capillary: 144 mg/dL — ABNORMAL HIGH (ref 70–99)
Glucose-Capillary: 161 mg/dL — ABNORMAL HIGH (ref 70–99)
Glucose-Capillary: 237 mg/dL — ABNORMAL HIGH (ref 70–99)
Glucose-Capillary: 180 mg/dL — ABNORMAL HIGH (ref 70–99)
Glucose-Capillary: 138 mg/dL — ABNORMAL HIGH (ref 70–99)
Glucose-Capillary: 231 mg/dL — ABNORMAL HIGH (ref 70–99)

## 2022-06-25 LAB — POCT ACTIVATED CLOTTING TIME
Activated Clotting Time: 275 seconds
Activated Clotting Time: 414 seconds

## 2022-06-25 SURGERY — CORONARY STENT INTERVENTION
Anesthesia: LOCAL

## 2022-06-25 MED ORDER — LABETALOL HCL 5 MG/ML IV SOLN: 10.0000 mg | INTRAVENOUS | Status: AC | PRN

## 2022-06-25 MED ORDER — HEPARIN (PORCINE) IN NACL 1000-0.9 UT/500ML-% IV SOLN
INTRAVENOUS | Status: AC
  Filled 2022-06-25: qty 500

## 2022-06-25 MED ORDER — ASPIRIN 81 MG PO CHEW
81.0000 mg | CHEWABLE_TABLET | Freq: Every day | ORAL | Status: DC
Start: 2022-06-26 — End: 2022-06-29
  Administered 2022-06-26 – 2022-06-28 (×3): 81 mg via ORAL
  Filled 2022-06-25 (×3): qty 1

## 2022-06-25 MED ORDER — SODIUM CHLORIDE 0.9% FLUSH
3.0000 mL | Freq: Two times a day (BID) | INTRAVENOUS | Status: DC
  Administered 2022-06-25 – 2022-06-28 (×7): 3 mL via INTRAVENOUS

## 2022-06-25 MED ORDER — HYDRALAZINE HCL 20 MG/ML IJ SOLN: 10.0000 mg | INTRAMUSCULAR | Status: AC | PRN

## 2022-06-25 MED ORDER — LIDOCAINE HCL (PF) 1 % IJ SOLN
INTRAMUSCULAR | Status: DC | PRN
  Administered 2022-06-25: 2 mL

## 2022-06-25 MED ORDER — HEPARIN SODIUM (PORCINE) 1000 UNIT/ML IJ SOLN
INTRAMUSCULAR | Status: AC
  Filled 2022-06-25: qty 10

## 2022-06-25 MED ORDER — SODIUM CHLORIDE 0.9 % IV SOLN: INTRAVENOUS | Status: AC

## 2022-06-25 MED ORDER — HEPARIN (PORCINE) IN NACL 1000-0.9 UT/500ML-% IV SOLN
INTRAVENOUS | Status: DC | PRN
  Administered 2022-06-25 (×2): 500 mL

## 2022-06-25 MED ORDER — HEPARIN SODIUM (PORCINE) 5000 UNIT/ML IJ SOLN
5000.0000 [IU] | Freq: Three times a day (TID) | INTRAMUSCULAR | Status: DC
  Administered 2022-06-25 – 2022-06-28 (×9): 5000 [IU] via SUBCUTANEOUS
  Filled 2022-06-25 (×9): qty 1

## 2022-06-25 MED ORDER — NITROGLYCERIN 1 MG/10 ML FOR IR/CATH LAB
INTRA_ARTERIAL | Status: AC
  Filled 2022-06-25: qty 10

## 2022-06-25 MED ORDER — OXYCODONE HCL 5 MG PO TABS: 5.0000 mg | ORAL_TABLET | ORAL | Status: DC | PRN

## 2022-06-25 MED ORDER — CLOPIDOGREL BISULFATE 75 MG PO TABS
75.0000 mg | ORAL_TABLET | Freq: Every day | ORAL | Status: DC
Start: 2022-06-26 — End: 2022-06-29
  Administered 2022-06-26 – 2022-06-28 (×3): 75 mg via ORAL
  Filled 2022-06-25 (×3): qty 1

## 2022-06-25 MED ORDER — FENTANYL CITRATE (PF) 100 MCG/2ML IJ SOLN
INTRAMUSCULAR | Status: DC | PRN
  Administered 2022-06-25 (×2): 12.5 ug via INTRAVENOUS

## 2022-06-25 MED ORDER — CLOPIDOGREL BISULFATE 300 MG PO TABS
300.0000 mg | ORAL_TABLET | Freq: Once | ORAL | Status: AC
  Administered 2022-06-25: 300 mg via ORAL
  Filled 2022-06-25: qty 1

## 2022-06-25 MED ORDER — MIDAZOLAM HCL 2 MG/2ML IJ SOLN
INTRAMUSCULAR | Status: DC | PRN
  Administered 2022-06-25 (×2): .5 mg via INTRAVENOUS

## 2022-06-25 MED ORDER — SODIUM CHLORIDE 0.9 % IV SOLN
250.0000 mL | INTRAVENOUS | Status: DC | PRN
Start: 2022-06-25 — End: 2022-06-29

## 2022-06-25 MED ORDER — IOHEXOL 350 MG/ML SOLN
INTRAVENOUS | Status: DC | PRN
  Administered 2022-06-25: 95 mL

## 2022-06-25 MED ORDER — ALBUTEROL SULFATE (2.5 MG/3ML) 0.083% IN NEBU
2.5000 mg | INHALATION_SOLUTION | Freq: Two times a day (BID) | RESPIRATORY_TRACT | Status: DC
  Administered 2022-06-25 – 2022-06-27 (×4): 2.5 mg via RESPIRATORY_TRACT
  Filled 2022-06-25 (×4): qty 3

## 2022-06-25 MED ORDER — FENTANYL CITRATE (PF) 100 MCG/2ML IJ SOLN
INTRAMUSCULAR | Status: AC
  Filled 2022-06-25: qty 2

## 2022-06-25 MED ORDER — LIDOCAINE HCL (PF) 1 % IJ SOLN
INTRAMUSCULAR | Status: AC
  Filled 2022-06-25: qty 30

## 2022-06-25 MED ORDER — VERAPAMIL HCL 2.5 MG/ML IV SOLN
INTRAVENOUS | Status: DC | PRN
  Administered 2022-06-25: 10 mL via INTRA_ARTERIAL

## 2022-06-25 MED ORDER — ACETAMINOPHEN 325 MG PO TABS
650.0000 mg | ORAL_TABLET | ORAL | Status: DC | PRN
  Administered 2022-06-25: 650 mg via ORAL
  Filled 2022-06-25: qty 2

## 2022-06-25 MED ORDER — ONDANSETRON HCL 4 MG/2ML IJ SOLN: 4.0000 mg | Freq: Four times a day (QID) | INTRAMUSCULAR | Status: DC | PRN

## 2022-06-25 MED ORDER — VERAPAMIL HCL 2.5 MG/ML IV SOLN
INTRAVENOUS | Status: AC
  Filled 2022-06-25: qty 2

## 2022-06-25 MED ORDER — MIDAZOLAM HCL 2 MG/2ML IJ SOLN
INTRAMUSCULAR | Status: AC
  Filled 2022-06-25: qty 2

## 2022-06-25 MED ORDER — HEPARIN SODIUM (PORCINE) 1000 UNIT/ML IJ SOLN
INTRAMUSCULAR | Status: DC | PRN
  Administered 2022-06-25: 8000 [IU] via INTRAVENOUS
  Administered 2022-06-25: 2000 [IU] via INTRAVENOUS

## 2022-06-25 MED ORDER — SODIUM CHLORIDE 0.9% FLUSH: 3.0000 mL | INTRAVENOUS | Status: DC | PRN

## 2022-06-25 SURGICAL SUPPLY — 19 items
BALL SAPPHIRE NC24 4.5X12 (BALLOONS) ×2
BALLOON SAPPHIRE NC24 4.5X12 (BALLOONS) IMPLANT
CATH OPTICROSS HD (CATHETERS) ×1 IMPLANT
CATH SHOCKWAVE 3.5X12 (CATHETERS) IMPLANT
CATH VISTA GUIDE 6FR XB3 (CATHETERS) ×1 IMPLANT
CATHETER SHOCKWAVE 3.5X12 (CATHETERS) ×2
DEVICE RAD COMP TR BAND LRG (VASCULAR PRODUCTS) ×1 IMPLANT
GLIDESHEATH SLEND A-KIT 6F 22G (SHEATH) ×1 IMPLANT
GUIDEWIRE INQWIRE 1.5J.035X260 (WIRE) IMPLANT
INQWIRE 1.5J .035X260CM (WIRE) ×2
KIT ENCORE 26 ADVANTAGE (KITS) ×1 IMPLANT
KIT HEART LEFT (KITS) ×2 IMPLANT
PACK CARDIAC CATHETERIZATION (CUSTOM PROCEDURE TRAY) ×2 IMPLANT
SHEATH PROBE COVER 6X72 (BAG) ×1 IMPLANT
SLED PULL BACK IVUS (MISCELLANEOUS) ×1 IMPLANT
STENT ONYX FRONTIER 4.0X15 (Permanent Stent) ×1 IMPLANT
TRANSDUCER W/STOPCOCK (MISCELLANEOUS) ×2 IMPLANT
TUBING CIL FLEX 10 FLL-RA (TUBING) ×2 IMPLANT
WIRE ASAHI PROWATER 180CM (WIRE) ×1 IMPLANT

## 2022-06-25 NOTE — Progress Notes (Signed)
PROGRESS NOTE    Danielle Murray  WEX:[id number] DOB: [birth year] DOA: 06/19/2022 PCP: [name], MD    Chief Complaint  Patient presents with   Fever    Brief Narrative:   Danielle Murray is a 81 y.o. female with medical history significant of CAD s/p stent; HTN; HLD; hypothyroidism; and DM presenting with fever. She was last admitted from 7/21-25 for hip fracture s/p repair.  She has been doing well with her rehab but became SOB a day or two ago.  She has had SOB periodically prior to last hospitalization, would have to sit down and rest with exertion.  Worse in the last 2 days significantly.  She has had mild pain in the back of her shoulder blades and pressure in her substernal region, "not real bad though."  She developed fever in the last 2 days, max 102.  No cough.  She was wheezing early this AM.  No urinary symptoms.   - her work up significant NSTEMI, and pneumonia, she is admitted for further workup.  Assessment & Plan:   Principal Problem:   NSTEMI (non-ST elevated myocardial infarction) ([place]) Active Problems:   Hypothyroidism   Essential hypertension   Uncontrolled type 2 diabetes mellitus with hyperglycemia, with long-term current use of insulin (HCC)   CAD (coronary artery disease), native coronary artery   Dyslipidemia   Status post surgery   Pressure injury of skin   Acute on chronic combined systolic and diastolic HF (heart failure) (HCC)  Pneumonia Acute hypoxemic respiratory failure -Patient presenting with cough, fever to 102, mildly decreased oxygen saturation, and infiltrate in right lower lobe on chest x-ray, as well as significantly elevated procalcitonin, she has been treated for up pneumonia. -MRSA negative, vancomycin stopped, continue with cefepime and azithromycin, to finish total 7 days. -Improving, he is down from 7 to 2 L oxygen requirement today  Acute systolic CHF -Echo showing drop in EF 35 to 40% with wall motion abnormalities -Continue  with IV diuresis, she is -5.9 L since admission, -2.6 L over last 24 hours -Continue with Coreg, Aldactone, and losartan  Non- stemi -Troponin significantly elevated, peaked at 2400, and EKG with acute changes, 2D echo shows decline in her EF 35 to 40%. -On heparin gtt. initially -Treatment Per cardiology, cardiac cath on 8/10 significant for moderate three-vessel coronary artery disease, plan for PCI today cardiology -Continue with Coreg, aspirin, Crestor and Imdur, started on plavix  as well.  Acute on chronic blood loss anemia -Received 1 unit PRBC 8/9, especially in the setting of symptomatic anemia and MI, hemoglobin stable.  HTN -Continue carvedilol, amlodipine -on PRN hydralazine    HLD -Continue Crestor -Check lipids   DM -A1c is 7.5, suboptimal control -Hold Jardiance -Switch 70/30 to sem-glee for now -Will cover with moderate-scale SSI for now   Hypothyroidism -Continue Synthroid   Recent hip fracture -Appears to be doing well from this standpoint -She is likely to need to return to rehab following this hospitalization -/OT     Obesity Body mass index is 32.1 kg/m.  Pressure ulcer -Present on admission -Consulted wound care  Pressure Injury 06/19/22 Buttocks Left Stage 2 -  Partial thickness loss of dermis presenting as a shallow open injury with a red, pink wound bed without slough. (Active)  06/19/22 1202  Location: Buttocks  Location Orientation: Left  Staging: Stage 2 -  Partial thickness loss of dermis presenting as a shallow open injury with a red, pink wound bed without slough.  Wound Description (Comments):   Present on Admission: Yes       DVT prophylaxis: Heparin GTT>> [place] heparin Code Status: Full Family Communication: D/W husband at bedside daily Disposition:   Status is: Inpatient    Consultants:  Cardiology PCCM  Subjective:  No significant events overnight, patient reports she is feeling a lot better today, dyspnea has  improved, reports she was able to stand up with PT yesterday.  Objective: Vitals:   06/24/22 1951 06/24/22 2333 06/25/22 0345 06/25/22 0711  BP:  (!) 122/51 (!) 124/46   Pulse:  63 62 62  Resp:  '17 20 14  '$ Temp:  98.2 F (36.8 C) (!) 97.5 F (36.4 C)   TempSrc:  Oral Oral   SpO2: 95% 97% 96% 99%  Weight:      Height:        Intake/Output Summary (Last 24 hours) at 06/25/2022 0915 Last data filed at 06/25/2022 0600 Gross per 24 hour  Intake 952.16 ml  Output 3750 ml  Net -2797.84 ml   Filed Weights   06/19/22 0426  Weight: 84.8 kg    Examination:  Awake Alert, Oriented X 3, No new F.N deficits, Normal affect Symmetrical Chest wall movement, improved air entry at the bases RRR,No Gallops,Rubs or new Murmurs, No Parasternal Heave +ve B.Sounds, Abd Soft, No tenderness, No rebound - guarding or rigidity. No Cyanosis, Clubbing or edema, No new Rash or bruise          Data Reviewed: I have personally reviewed following labs and imaging studies  CBC: Recent Labs  Lab 06/19/22 0430 06/19/22 0442 06/21/22 0642 06/21/22 1526 06/21/22 1531 06/22/22 0049 06/23/22 0025 06/24/22 0017 06/25/22 0144  WBC 12.9*   < > 11.8*  --   --  9.8 8.8 8.0 10.1  NEUTROABS 12.0*  --   --   --   --   --   --   --   --   HGB 9.7*   < > 9.3*   < > 10.2* 9.0* 9.3* 10.1* 10.7*  HCT 31.1*   < > 28.9*   < > 30.0* 29.0* 28.4* 31.0* 32.8*  MCV 90.9   < > 87.8  --   --  89.2 86.6 86.6 86.3  PLT 413*   < > 295  --   --  298 332 389 425*   < > = values in this interval not displayed.    Basic Metabolic Panel: Recent Labs  Lab 06/21/22 0642 06/21/22 1526 06/21/22 1531 06/22/22 0049 06/23/22 0025 06/24/22 0017 06/25/22 0144  NA 134*   < > 129* 134* 136 137 137  K 4.0   < > 4.0 4.2 3.9 3.7 3.8  CL 107  --   --  109 107 106 103  CO2 17*  --   --  16* 19* 21* 22  GLUCOSE 130*  --   --  130* 163* 182* 194*  BUN 37*  --   --  38* 42* 41* 40*  CREATININE 1.29*  --   --  1.27* 1.22* 1.21*  1.19*  CALCIUM 8.0*  --   --  7.9* 8.2* 8.7* 8.6*   < > = values in this interval not displayed.    GFR: Estimated Creatinine Clearance: 39.7 mL/min (A) (by C-G formula based on SCr of 1.19 mg/dL (H)).  Liver Function Tests: Recent Labs  Lab 06/19/22 0430  AST 22  ALT 12  ALKPHOS 111  BILITOT 1.7*  PROT 6.3*  ALBUMIN  2.5*    CBG: Recent Labs  Lab 06/24/22 0802 06/24/22 1058 06/24/22 1528 06/24/22 2147 06/25/22 0602  GLUCAP 226* 207* 227* 237* 180*     Recent Results (from the past 240 hour(s))  Resp Panel by RT-PCR (Flu A&B, Covid) Peripheral     Status: None   Collection Time: 06/19/22  4:28 AM   Specimen: Peripheral; Nasal Swab  Result Value Ref Range Status   SARS Coronavirus 2 by RT PCR NEGATIVE NEGATIVE Final    Comment: (NOTE) SARS-CoV-2 target nucleic acids are NOT DETECTED.  The SARS-CoV-2 RNA is generally detectable in upper respiratory specimens during the acute phase of infection. The lowest concentration of SARS-CoV-2 viral copies this assay can detect is 138 copies/mL. A negative result does not preclude SARS-Cov-2 infection and should not be used as the sole basis for treatment or other patient management decisions. A negative result may occur with  improper specimen collection/handling, submission of specimen other than nasopharyngeal swab, presence of viral mutation(s) within the areas targeted by this assay, and inadequate number of viral copies(<138 copies/mL). A negative result must be combined with clinical observations, patient history, and epidemiological information. The expected result is Negative.  Fact Sheet for Patients:  EntrepreneurPulse.com.au  Fact Sheet for Healthcare Providers:  IncredibleEmployment.be  This test is no t yet approved or cleared by the [place] FDA and  has been authorized for detection and/or diagnosis of SARS-CoV-2 by FDA under an Emergency Use Authorization (EUA).  This EUA will remain  in effect (meaning this test can be used) for the duration of the COVID-19 declaration under Section 564(b)(1) of the Act, 21 U.S.C.section 360bbb-3(b)(1), unless the authorization is terminated  or revoked sooner.       Influenza A by PCR NEGATIVE NEGATIVE Final   Influenza B by PCR NEGATIVE NEGATIVE Final    Comment: (NOTE) The Xpert Xpress SARS-CoV-2/FLU/RSV plus assay is intended as an aid in the diagnosis of influenza from Nasopharyngeal swab specimens and should not be used as a sole basis for treatment. Nasal washings and aspirates are unacceptable for Xpert Xpress SARS-CoV-2/FLU/RSV testing.  Fact Sheet for Patients: EntrepreneurPulse.com.au  Fact Sheet for Healthcare Providers: IncredibleEmployment.be  This test is not yet approved or cleared by the [place] FDA and has been authorized for detection and/or diagnosis of SARS-CoV-2 by FDA under an Emergency Use Authorization (EUA). This EUA will remain in effect (meaning this test can be used) for the duration of the COVID-19 declaration under Section 564(b)(1) of the Act, 21 U.S.C. section 360bbb-3(b)(1), unless the authorization is terminated or revoked.  Performed at [facility], [place] [address], [place], [place] [address]   Blood Culture (routine x 2)     Status: None   Collection Time: 06/19/22  4:30 AM   Specimen: BLOOD RIGHT WRIST  Result Value Ref Range Status   Specimen Description BLOOD RIGHT WRIST  Final   Special Requests   Final    BOTTLES DRAWN AEROBIC AND ANAEROBIC Blood Culture adequate volume   Culture   Final    NO GROWTH 5 DAYS Performed at [facility], [place] [address], [place], [place] [address]    Report Status 06/24/2022 FINAL  Final  Blood Culture (routine x 2)     Status: None   Collection Time: 06/19/22  4:36 AM   Specimen: BLOOD LEFT HAND  Result Value Ref Range Status   Specimen Description BLOOD LEFT HAND   Final   Special Requests  Final    BOTTLES DRAWN AEROBIC AND ANAEROBIC Blood Culture results may not be optimal due to an inadequate volume of blood received in culture bottles   Culture   Final    NO GROWTH 5 DAYS Performed at [facility], [place] [address], [place], [place] [address]    Report Status 06/24/2022 FINAL  Final  Urine Culture     Status: Abnormal   Collection Time: 06/19/22  4:54 AM   Specimen: In/Out Cath Urine  Result Value Ref Range Status   Specimen Description IN/OUT CATH URINE  Final   Special Requests   Final    NONE Performed at [facility], [place] [address] [place], [place] [address]    Culture >=100,000 COLONIES/mL ESCHERICHIA COLI (A)  Final   Report Status 06/21/2022 FINAL  Final   Organism ID, Bacteria ESCHERICHIA COLI (A)  Final      Susceptibility   Escherichia coli - MIC*    AMPICILLIN <=2 SENSITIVE Sensitive     CEFAZOLIN <=4 SENSITIVE Sensitive     CEFEPIME <=0.12 SENSITIVE Sensitive     CEFTRIAXONE <=0.25 SENSITIVE Sensitive     CIPROFLOXACIN <=0.25 SENSITIVE Sensitive     GENTAMICIN <=1 SENSITIVE Sensitive     IMIPENEM <=0.25 SENSITIVE Sensitive     NITROFURANTOIN <=16 SENSITIVE Sensitive     TRIMETH/SULFA <=20 SENSITIVE Sensitive     AMPICILLIN/SULBACTAM <=2 SENSITIVE Sensitive     PIP/TAZO <=4 SENSITIVE Sensitive     * >=100,000 COLONIES/mL ESCHERICHIA COLI  MRSA Next Gen by PCR, Nasal     Status: None   Collection Time: 06/20/22  3:58 PM   Specimen: Nasal Mucosa; Nasal Swab  Result Value Ref Range Status   MRSA by PCR Next Gen NOT DETECTED NOT DETECTED Final    Comment: (NOTE) The GeneXpert MRSA Assay (FDA approved for NASAL specimens only), is one component of a comprehensive MRSA colonization surveillance program. It is not intended to diagnose MRSA infection nor to guide or monitor treatment for MRSA infections. Test performance is not FDA approved in patients less than 76 years old. Performed at [facility],  [place] [address] [place], [place] [address]          Radiology Studies: No results found.      Scheduled Meds:  albuterol  2.5 mg Nebulization BID   aspirin EC  81 mg Oral Daily   carvedilol  6.25 mg Oral BID WC   clopidogrel  300 mg Oral Once   clopidogrel  75 mg Oral Daily   docusate sodium  100 mg Oral BID   furosemide  80 mg Intravenous BID   heparin  5,000 Units Subcutaneous Q8H   insulin aspart  0-15 Units Subcutaneous TID WC   insulin aspart  0-5 Units Subcutaneous QHS   insulin glargine-yfgn  15 Units Subcutaneous BID   isosorbide mononitrate  30 mg Oral Daily   leptospermum manuka honey  1 Application Topical Daily   levothyroxine  88 mcg Oral QHS   losartan  12.5 mg Oral Daily   rosuvastatin  20 mg Oral Daily   sodium chloride flush  3 mL Intravenous Q12H   sodium chloride flush  3 mL Intravenous Q12H   sodium chloride flush  3 mL Intravenous Q12H   spironolactone  12.5 mg Oral Daily   Continuous Infusions:  sodium chloride     sodium chloride Stopped (06/25/22 0550)   ceFEPime (MAXIPIME) IV 200 mL/hr at 06/25/22 0600  promethazine (PHENERGAN) injection (IM or IVPB)       LOS: 6 days      Phillips Climes, MD [facility]   To contact the attending provider between 7A-7P or the covering provider during after hours 7P-7A, please log into the web site [email or web address] and access using universal [place] password for that web site. If you do not have the password, please call the hospital operator.  06/25/2022, 9:15 AM

## 2022-06-25 NOTE — Plan of Care (Signed)
  Problem: Education: Goal: Understanding of CV disease, CV risk reduction, and recovery process will improve Outcome: Progressing Goal: Individualized Educational Video(s) Outcome: Progressing   Problem: Activity: Goal: Ability to return to baseline activity level will improve Outcome: Progressing   Problem: Cardiovascular: Goal: Ability to achieve and maintain adequate cardiovascular perfusion will improve Outcome: Progressing Goal: Vascular access site(s) Level 0-1 will be maintained Outcome: Progressing   Problem: Activity: Goal: Ability to tolerate increased activity will improve Outcome: Progressing   Problem: Respiratory: Goal: Ability to maintain adequate ventilation will improve Outcome: Progressing Goal: Ability to maintain a clear airway will improve Outcome: Progressing   Problem: Clinical Measurements: Goal: Ability to maintain clinical measurements within normal limits will improve Outcome: Progressing Goal: Will remain free from infection Outcome: Progressing Goal: Diagnostic test results will improve Outcome: Progressing Goal: Respiratory complications will improve Outcome: Progressing Goal: Cardiovascular complication will be avoided Outcome: Progressing   Problem: Activity: Goal: Risk for activity intolerance will decrease Outcome: Progressing   Problem: Nutrition: Goal: Adequate nutrition will be maintained Outcome: Progressing   Problem: Elimination: Goal: Will not experience complications related to bowel motility Outcome: Progressing Goal: Will not experience complications related to urinary retention Outcome: Progressing

## 2022-06-25 NOTE — Care Management Important Message (Signed)
Important Message  Patient Details  Name: Danielle Murray MRN: [id number] Date of Birth: 02-Apr-1941   Medicare Important Message Given:  Yes     [name] [address] 06/25/2022, 2:11 PM

## 2022-06-25 NOTE — Progress Notes (Signed)
Progress Note  Patient Name: Danielle Murray Date of Encounter: 06/25/2022  [facility] Cardiologist: Jenkins Rouge, MD   Subjective   Feeling better today.  Husband at bedside.  Breathing is improved.  No chest pain or pressure.  Inpatient Medications    Scheduled Meds:  albuterol  2.5 mg Nebulization TID   aspirin EC  81 mg Oral Daily   carvedilol  6.25 mg Oral BID WC   clopidogrel  75 mg Oral Daily   docusate sodium  100 mg Oral BID   furosemide  80 mg Intravenous BID   heparin  5,000 Units Subcutaneous Q8H   insulin aspart  0-15 Units Subcutaneous TID WC   insulin aspart  0-5 Units Subcutaneous QHS   insulin glargine-yfgn  15 Units Subcutaneous BID   isosorbide mononitrate  30 mg Oral Daily   leptospermum manuka honey  1 Application Topical Daily   levothyroxine  88 mcg Oral QHS   losartan  12.5 mg Oral Daily   rosuvastatin  20 mg Oral Daily   sodium chloride flush  3 mL Intravenous Q12H   sodium chloride flush  3 mL Intravenous Q12H   sodium chloride flush  3 mL Intravenous Q12H   spironolactone  12.5 mg Oral Daily   Continuous Infusions:  sodium chloride     sodium chloride Stopped (06/25/22 0550)   ceFEPime (MAXIPIME) IV 200 mL/hr at 06/25/22 0600   promethazine (PHENERGAN) injection (IM or IVPB)     PRN Meds: sodium chloride, acetaminophen **OR** acetaminophen, albuterol, bisacodyl, guaiFENesin, hydrALAZINE, melatonin, morphine injection, ondansetron **OR** ondansetron (ZOFRAN) IV, mouth rinse, oxyCODONE, polyethylene glycol, simethicone, sodium chloride flush   Vital Signs    Vitals:   06/24/22 1951 06/24/22 2333 06/25/22 0345 06/25/22 0711  BP:  (!) 122/51 (!) 124/46   Pulse:  63 62 62  Resp:  '17 20 14  '$ Temp:  98.2 F (36.8 C) (!) 97.5 F (36.4 C)   TempSrc:  Oral Oral   SpO2: 95% 97% 96% 99%  Weight:      Height:        Intake/Output Summary (Last 24 hours) at 06/25/2022 0829 Last data filed at 06/25/2022 0600 Gross per 24 hour  Intake  952.16 ml  Output 3750 ml  Net -2797.84 ml      06/19/2022    4:26 AM 06/02/2022    9:29 AM 06/01/2022    8:44 PM  Last 3 Weights  Weight (lbs) 187 lb 185 lb 180 lb  Weight (kg) 84.823 kg 83.915 kg 81.647 kg      Telemetry    Normal sinus rhythm/sinus bradycardia without significant arrhythmia- Personally Reviewed   Physical Exam  Alert, oriented, elderly woman in no distress on oxygen 2 L per nasal cannula GEN: No acute distress.   Neck: No JVD Cardiac: RRR, no murmurs, rubs, or gallops.  Respiratory: Clear to auscultation bilaterally. GI: Soft, nontender, non-distended  MS: No edema; No deformity. Neuro:  Nonfocal  Psych: Normal affect   Labs    High Sensitivity Troponin:   Recent Labs  Lab 06/19/22 0430 06/19/22 0641  TROPONINIHS 2,421* 2,139*     Chemistry Recent Labs  Lab 06/19/22 0430 06/19/22 0442 06/23/22 0025 06/24/22 0017 06/25/22 0144  NA 134*   < > 136 137 137  K 3.7   < > 3.9 3.7 3.8  CL 102   < > 107 106 103  CO2 17*   < > 19* 21* 22  GLUCOSE 180*   < >  163* 182* 194*  BUN 36*   < > 42* 41* 40*  CREATININE 1.45*   < > 1.22* 1.21* 1.19*  CALCIUM 8.1*   < > 8.2* 8.7* 8.6*  PROT 6.3*  --   --   --   --   ALBUMIN 2.5*  --   --   --   --   AST 22  --   --   --   --   ALT 12  --   --   --   --   ALKPHOS 111  --   --   --   --   BILITOT 1.7*  --   --   --   --   GFRNONAA 36*   < > 45* 45* 46*  ANIONGAP 15   < > '10 10 12   '$ < > = values in this interval not displayed.    Lipids No results for input(s): "CHOL", "TRIG", "HDL", "LABVLDL", "LDLCALC", "CHOLHDL" in the last 168 hours.  Hematology Recent Labs  Lab 06/23/22 0025 06/24/22 0017 06/25/22 0144  WBC 8.8 8.0 10.1  RBC 3.28* 3.58* 3.80*  HGB 9.3* 10.1* 10.7*  HCT 28.4* 31.0* 32.8*  MCV 86.6 86.6 86.3  MCH 28.4 28.2 28.2  MCHC 32.7 32.6 32.6  RDW 14.6 14.5 14.4  PLT 332 389 425*   Thyroid No results for input(s): "TSH", "FREET4" in the last 168 hours.  BNP Recent Labs  Lab  06/19/22 0921  BNP 1,579.3*    DDimer No results for input(s): "DDIMER" in the last 168 hours.   Radiology    No results found.  Cardiac Studies   Cardiac cath study personally reviewed  Patient Profile     81 y.o. female with CAD, diabetes, hypertension, SVT who was admitted on 06/19/2022 with acute respiratory failure secondary to pneumonia.  Course complicated by non-STEMI.  Assessment & Plan    1.  Non-STEMI: Demand ischemia with new wall motion abnormalities by echo and reduced LVEF now 35 to 40%.  Cardiac catheterization films reviewed with severe stenosis of the ostial/proximal circumflex, patency of the LAD stent, ostial stenosis of a jailed diagonal branch related to previous LAD stenting, and a nondominant RCA.  There has been extensive discussion amongst interventional cardiology colleagues regarding treatment recommendations of PCI versus medical therapy.  There is concern that circumflex ischemia is the culprit for her non-STEMI and wall motion abnormality.  I think it is reasonable to consider PCI and I will discuss with interventional cardiology today.  I talked with the patient about risks and potential benefits to PCI, her specific anatomy with involvement of the proximal circumflex near the ostium somewhat increasing the risk of PCI, and her respiratory status continuing to require O2 also increasing her procedural risk to a modest degree.  She does appear to be improved from a respiratory standpoint.  I have reviewed her medications and she has been on clopidogrel, but did not appear to receive it for the first several days of her hospitalization.  She has had 2 clopidogrel doses during this hospitalization so I will reload her with clopidogrel 300 mg this morning.  We will tentatively place on the cath schedule for PCI later today. 2.  Acute systolic heart failure: Currently treated with carvedilol, losartan, and spironolactone.  Plans noted to add SGLT2 inhibitor as she  approaches discharge.  Overall improved.  She has been continued on furosemide 80 mg IV twice daily.  Likely will switch her over to oral  furosemide in the next 24 hours pending her clinical course. 3.  Anemia: Appears stable with hemoglobin 10.7 today.  Patient postop from hip surgery with no signs of active bleeding. 4.  CKD stage IIIa: Stable renal function noted.  Tolerating diuresis well.      For questions or updates, please contact [place] Please consult [email or web address] for contact info under        Signed, Sherren Mocha, MD  06/25/2022, 8:29 AM

## 2022-06-25 NOTE — Plan of Care (Signed)
  Problem: Activity: Goal: Ability to return to baseline activity level will improve Outcome: Progressing   Problem: Cardiovascular: Goal: Ability to achieve and maintain adequate cardiovascular perfusion will improve Outcome: Progressing Goal: Vascular access site(s) Level 0-1 will be maintained Outcome: Progressing   Problem: Health Behavior/Discharge Planning: Goal: Ability to safely manage health-related needs after discharge will improve Outcome: Progressing   Problem: Activity: Goal: Ability to tolerate increased activity will improve Outcome: Progressing   Problem: Clinical Measurements: Goal: Ability to maintain a body temperature in the normal range will improve Outcome: Progressing   Problem: Education: Goal: Knowledge of General Education information will improve Description: Including pain rating scale, medication(s)/side effects and non-pharmacologic comfort measures Outcome: Progressing   Problem: Health Behavior/Discharge Planning: Goal: Ability to manage health-related needs will improve Outcome: Progressing   Problem: Clinical Measurements: Goal: Ability to maintain clinical measurements within normal limits will improve Outcome: Progressing Goal: Will remain free from infection Outcome: Progressing Goal: Diagnostic test results will improve Outcome: Progressing Goal: Respiratory complications will improve Outcome: Progressing Goal: Cardiovascular complication will be avoided Outcome: Progressing   Problem: Nutrition: Goal: Adequate nutrition will be maintained Outcome: Progressing   Problem: Safety: Goal: Ability to remain free from injury will improve Outcome: Progressing   Problem: Skin Integrity: Goal: Risk for impaired skin integrity will decrease Outcome: Progressing

## 2022-06-25 NOTE — Inpatient Diabetes Management (Signed)
Inpatient Diabetes Program Recommendations  AACE/ADA: New Consensus Statement on Inpatient Glycemic Control   Target Ranges:  Prepandial:   less than 140 mg/dL      Peak postprandial:   less than 180 mg/dL (1-2 hours)      Critically ill patients:  140 - 180 mg/dL    Latest Reference Range & Units 06/24/22 08:02 06/24/22 10:58 06/24/22 15:28 06/24/22 21:47 06/25/22 06:02  Glucose-Capillary 70 - 99 mg/dL 226 (H) 207 (H) 227 (H) 237 (H) 180 (H)   Review of Glycemic Control  Diabetes history: DM2 Outpatient Diabetes medications: 70/30 30 units QAM, 70/30 50 units QPM, Jardiance 25 mg daily Current orders for Inpatient glycemic control: Semglee 15 units BID, Novolog 0-15 units TID with meals, Novolog 0-5 units QHS  Inpatient Diabetes Program Recommendations:    Insulin: Please consider increasing Semglee to 17 units BID.  Once diet is resumed and if patient eating at least 50% of meals, may need to consider ordering Novolog 3 units TID with meals for meal coverage if patient eats at least 50% of meals.  Thanks, Barnie Alderman, RN, MSN, [facility] Coordinator Inpatient Diabetes Program [phone] (Team Pager from 8am to [place])

## 2022-06-25 NOTE — Progress Notes (Signed)
OT Cancellation Note  Patient Details Name: SOMER TROTTER MRN: [id number] DOB: Feb 27, 1941   Cancelled Treatment:    Reason Eval/Treat Not Completed: Patient declined, no reason specified Pt requesting to hold therapy at this time as she reports staff has prepped her for possible stent placement. Will return later as time allows and pt is appropriate.   [facility] OTR/L Acute Rehabilitation Services Office: [place] 06/25/2022, 9:35 AM

## 2022-06-25 NOTE — CV Procedure (Signed)
60 to 70% ostial proximal circumflex reduced to less than 10% after shockwave followed by 4 x 15 Onyx postdilated to 4.5 mm at 15 atm. No complications.

## 2022-06-25 NOTE — Progress Notes (Addendum)
RN removed all air from TR band and noticed bruising increasing size, soft, level 1.  Pressure held x 15 mins. Called cath lab, cath lab at bedside additional pressure held on site. No further interventions needed at this time.

## 2022-06-26 DIAGNOSIS — I214 Non-ST elevation (NSTEMI) myocardial infarction: Secondary | ICD-10-CM | POA: Diagnosis not present

## 2022-06-26 DIAGNOSIS — I5043 Acute on chronic combined systolic (congestive) and diastolic (congestive) heart failure: Secondary | ICD-10-CM | POA: Diagnosis not present

## 2022-06-26 DIAGNOSIS — I2510 Atherosclerotic heart disease of native coronary artery without angina pectoris: Secondary | ICD-10-CM | POA: Diagnosis not present

## 2022-06-26 LAB — BASIC METABOLIC PANEL
Anion gap: 9 (ref 5–15)
BUN: 44 mg/dL — ABNORMAL HIGH (ref 8–23)
CO2: 24 mmol/L (ref 22–32)
Calcium: 8.4 mg/dL — ABNORMAL LOW (ref 8.9–10.3)
Chloride: 104 mmol/L (ref 98–111)
Creatinine, Ser: 1.16 mg/dL — ABNORMAL HIGH (ref 0.44–1.00)
GFR, Estimated: 48 mL/min — ABNORMAL LOW (ref >60)
Glucose, Bld: 167 mg/dL — ABNORMAL HIGH (ref 70–99)
Potassium: 3.7 mmol/L (ref 3.5–5.1)
Sodium: 137 mmol/L (ref 135–145)

## 2022-06-26 LAB — GLUCOSE, CAPILLARY
Glucose-Capillary: 199 mg/dL — ABNORMAL HIGH (ref 70–99)
Glucose-Capillary: 173 mg/dL — ABNORMAL HIGH (ref 70–99)
Glucose-Capillary: 197 mg/dL — ABNORMAL HIGH (ref 70–99)
Glucose-Capillary: 134 mg/dL — ABNORMAL HIGH (ref 70–99)

## 2022-06-26 LAB — CBC
HCT: 30.5 % — ABNORMAL LOW (ref 36.0–46.0)
Hemoglobin: 10.1 g/dL — ABNORMAL LOW (ref 12.0–15.0)
MCH: 28.8 pg (ref 26.0–34.0)
MCHC: 33.1 g/dL (ref 30.0–36.0)
MCV: 86.9 fL (ref 80.0–100.0)
Platelets: 410 10*3/uL — ABNORMAL HIGH (ref 150–400)
RBC: 3.51 MIL/uL — ABNORMAL LOW (ref 3.87–5.11)
RDW: 14.4 % (ref 11.5–15.5)
WBC: 9.8 10*3/uL (ref 4.0–10.5)
nRBC: 0 % (ref 0.0–0.2)

## 2022-06-26 MED ORDER — EMPAGLIFLOZIN 10 MG PO TABS
10.0000 mg | ORAL_TABLET | Freq: Every day | ORAL | Status: DC
  Administered 2022-06-26 – 2022-06-28 (×3): 10 mg via ORAL
  Filled 2022-06-26 (×3): qty 1

## 2022-06-26 MED ORDER — FUROSEMIDE 40 MG PO TABS
40.0000 mg | ORAL_TABLET | Freq: Every day | ORAL | Status: DC
  Administered 2022-06-27 – 2022-06-28 (×2): 40 mg via ORAL
  Filled 2022-06-26 (×2): qty 1

## 2022-06-26 MED FILL — Nitroglycerin IV Soln 100 MCG/ML in D5W: INTRA_ARTERIAL | Qty: 10 | Status: AC

## 2022-06-26 NOTE — Plan of Care (Signed)
  Problem: Cardiovascular: Goal: Ability to achieve and maintain adequate cardiovascular perfusion will improve Outcome: Progressing Goal: Vascular access site(s) Level 0-1 will be maintained Outcome: Progressing   Problem: Health Behavior/Discharge Planning: Goal: Ability to safely manage health-related needs after discharge will improve Outcome: Progressing   Problem: Activity: Goal: Ability to tolerate increased activity will improve Outcome: Progressing   Problem: Clinical Measurements: Goal: Ability to maintain a body temperature in the normal range will improve Outcome: Progressing   Problem: Respiratory: Goal: Ability to maintain adequate ventilation will improve Outcome: Progressing Goal: Ability to maintain a clear airway will improve Outcome: Progressing

## 2022-06-26 NOTE — Progress Notes (Signed)
Will await PT progress with ambulation given hip fx. Discussed with pt and husband MI, stent, restrictions, Plavix importance, NTG and CRPII. Gave heart healthy and DM diet sheets and encouraged her to think about sodium. Will refer to [place] for "down the road" after hip recovery. [id number] Yves Dill BS, ACSM-CEP 06/26/2022 10:23 AM

## 2022-06-26 NOTE — NC FL2 (Signed)
[place] LEVEL OF CARE SCREENING TOOL     IDENTIFICATION  Patient Name: Danielle Murray Birthdate: 1940-11-16 Sex: female Admission Date (Current Location): 06/19/2022  [facility] and [place] Number:  Herbalist and Address:  [facility], [place] [address], [place], [place] [address]      Provider Number: [id number]  Attending Physician Name and Address:  Elgergawy, Silver Huguenin, MD  Relative Name and Phone Number:  Sapir, Lavey (Spouse)   [phone]    Current Level of Care: Hospital Recommended Level of Care: [place] Prior Approval Number:    Date Approved/Denied:   PASRR Number: [id number]  Discharge Plan: SNF    Current Diagnoses: Patient Active Problem List   Diagnosis Date Noted   Acute on chronic combined systolic and diastolic HF (heart failure) (HCC)    Pressure injury of skin 06/20/2022   NSTEMI (non-ST elevated myocardial infarction) ([place]) 06/19/2022   Status post surgery 06/02/2022   Closed intertrochanteric fracture of hip, left, initial encounter ([place]) 06/01/2022   Early stage nonexudative age-related macular degeneration of both eyes 04/23/2022   Hypertensive retinopathy of right eye 03/28/2020   Posterior vitreous detachment of left eye 03/28/2020   Mild nonproliferative diabetic retinopathy of both eyes ([place]) 03/28/2020   Posterior vitreous detachment of right eye 03/28/2020   Peripheral arterial disease ([place]) 02/04/2019   CAD (coronary artery disease), native coronary artery 09/04/2013   Exertional angina ([place]) 09/04/2013   SVT (supraventricular tachycardia) ([place]) 09/04/2013   Uncontrolled type 2 diabetes mellitus with hyperglycemia, with long-term current use of insulin ([place]) 08/07/2012   Diverticulosis 08/07/2012   Dyslipidemia 01/09/2008   Essential hypertension 01/09/2008   Hypothyroidism 10/24/2007   DEPRESSION 10/24/2007    [place] RESPIRATION BLADDER Height & Weight      Self, Time, Situation, Place  Normal Continent Weight: 187 lb (84.8 kg) Height:  '5\' 4"'$  (162.6 cm)  BEHAVIORAL SYMPTOMS/MOOD NEUROLOGICAL BOWEL NUTRITION STATUS      Continent Diet  AMBULATORY STATUS COMMUNICATION OF NEEDS Skin   Limited Assist Verbally Normal                       Personal Care Assistance Level of Assistance  Bathing, Feeding, Dressing Bathing Assistance: Limited assistance Feeding assistance: Independent Dressing Assistance: Limited assistance     Functional Limitations Info  Sight, Hearing, Speech Sight Info: Adequate Hearing Info: Adequate Speech Info: Adequate    SPECIAL CARE FACTORS FREQUENCY  PT (By licensed PT), OT (By licensed OT)     PT Frequency: 5x a week OT Frequency: 5x a week            Contractures Contractures Info: Not present    Additional Factors Info  Code Status, Allergies Code Status Info: Full Allergies Info: Penicillins           Current Medications (06/26/2022):  This is the current hospital active medication list Current Facility-Administered Medications  Medication Dose Route Frequency Provider Last Rate Last Admin   0.9 %  sodium chloride infusion  250 mL Intravenous PRN Belva Crome, MD       0.9 %  sodium chloride infusion  250 mL Intravenous PRN Belva Crome, MD       acetaminophen (TYLENOL) suppository 650 mg  650 mg Rectal Q6H PRN Belva Crome, MD       acetaminophen (TYLENOL) tablet 650 mg  650 mg Oral Q4H PRN Belva Crome, MD   [phone]  mg at 06/25/22 1946   albuterol (PROVENTIL) (2.5 MG/3ML) 0.083% nebulizer solution 2.5 mg  2.5 mg Nebulization Q4H PRN Belva Crome, MD       albuterol (PROVENTIL) (2.5 MG/3ML) 0.083% nebulizer solution 2.5 mg  2.5 mg Nebulization BID Belva Crome, MD   2.5 mg at 06/26/22 0740   aspirin chewable tablet 81 mg  81 mg Oral Daily Belva Crome, MD   81 mg at 06/26/22 0809   bisacodyl (DULCOLAX) EC tablet 5 mg  5 mg Oral Daily PRN Belva Crome, MD       carvedilol  (COREG) tablet 6.25 mg  6.25 mg Oral BID WC Belva Crome, MD   6.25 mg at 06/26/22 0654   clopidogrel (PLAVIX) tablet 75 mg  75 mg Oral Q breakfast Belva Crome, MD   75 mg at 06/26/22 0654   docusate sodium (COLACE) capsule 100 mg  100 mg Oral BID Belva Crome, MD   100 mg at 06/26/22 2836   empagliflozin (JARDIANCE) tablet 10 mg  10 mg Oral Daily Reino Bellis B, NP   10 mg at 06/26/22 1300   [START ON 06/27/2022] furosemide (LASIX) tablet 40 mg  40 mg Oral Daily Sherren Mocha, MD       guaiFENesin [facility]) 12 hr tablet 600 mg  600 mg Oral BID PRN Belva Crome, MD       heparin injection 5,000 Units  5,000 Units Subcutaneous Q8H Belva Crome, MD   5,000 Units at 06/26/22 0654   hydrALAZINE (APRESOLINE) injection 5 mg  5 mg Intravenous Q4H PRN Belva Crome, MD       insulin aspart (novoLOG) injection 0-15 Units  0-15 Units Subcutaneous TID WC Belva Crome, MD   3 Units at 06/26/22 1300   insulin aspart (novoLOG) injection 0-5 Units  0-5 Units Subcutaneous QHS Belva Crome, MD   2 Units at 06/25/22 2120   insulin glargine-yfgn (SEMGLEE) injection 15 Units  15 Units Subcutaneous BID Belva Crome, MD   15 Units at 06/26/22 6294   isosorbide mononitrate (IMDUR) 24 hr tablet 30 mg  30 mg Oral Daily Belva Crome, MD   30 mg at 06/26/22 7654   leptospermum manuka honey (MEDIHONEY) paste 1 Application  1 Application Topical Daily Belva Crome, MD   1 Application at 65/03/54 1309   levothyroxine (SYNTHROID) tablet 88 mcg  88 mcg Oral QHS Belva Crome, MD   88 mcg at 06/25/22 2113   losartan (COZAAR) tablet 12.5 mg  12.5 mg Oral Daily Belva Crome, MD   12.5 mg at 06/26/22 0809   melatonin tablet 3 mg  3 mg Oral QHS PRN Belva Crome, MD       morphine (PF) 2 MG/ML injection 2 mg  2 mg Intravenous Q2H PRN Belva Crome, MD       ondansetron [facility]) injection 4 mg  4 mg Intravenous Q6H PRN Belva Crome, MD       ondansetron [facility]) tablet 4 mg  4 mg Oral Q6H PRN Belva Crome, MD       Oral care mouth rinse  15 mL Mouth Rinse PRN Belva Crome, MD       oxyCODONE (Oxy IR/ROXICODONE) immediate release tablet 5-10 mg  5-10 mg Oral Q4H PRN Belva Crome, MD   10 mg at 06/26/22 0808   polyethylene glycol (MIRALAX / GLYCOLAX) packet 17 g  17  g Oral Daily PRN Belva Crome, MD       promethazine (PHENERGAN) 12.5 mg in sodium chloride 0.9 % 50 mL IVPB  12.5 mg Intravenous Once Belva Crome, MD       rosuvastatin (CRESTOR) tablet 20 mg  20 mg Oral Daily Belva Crome, MD   20 mg at 06/26/22 0809   simethicone (MYLICON) 40 JM/4.2AS suspension 40 mg  40 mg Oral QID PRN Belva Crome, MD       sodium chloride flush (NS) 0.9 % injection 3 mL  3 mL Intravenous Q12H Belva Crome, MD   3 mL at 06/26/22 1029   sodium chloride flush (NS) 0.9 % injection 3 mL  3 mL Intravenous Q12H Belva Crome, MD   3 mL at 06/25/22 1035   sodium chloride flush (NS) 0.9 % injection 3 mL  3 mL Intravenous Q12H Belva Crome, MD   3 mL at 06/24/22 2157   sodium chloride flush (NS) 0.9 % injection 3 mL  3 mL Intravenous PRN Belva Crome, MD       sodium chloride flush (NS) 0.9 % injection 3 mL  3 mL Intravenous Q12H Belva Crome, MD   3 mL at 06/26/22 0816   sodium chloride flush (NS) 0.9 % injection 3 mL  3 mL Intravenous PRN Belva Crome, MD       spironolactone (ALDACTONE) tablet 12.5 mg  12.5 mg Oral Daily Belva Crome, MD   12.5 mg at 06/26/22 3419     Discharge Medications: Please see discharge summary for a list of discharge medications.  Relevant Imaging Results:  Relevant Lab Results:   Additional Information SSN: [id number]  Reece Agar, [place]

## 2022-06-26 NOTE — Progress Notes (Signed)
OT Cancellation Note  Patient Details Name: Danielle Murray MRN: [id number] DOB: [birth year]   Cancelled Treatment:    Reason Eval/Treat Not Completed: Patient declined, no reason specified- pt declined, reports just getting comfortable due to L hip pain.  Pt requesting OT check back this afternoon.   Jolaine Artist, OT Acute Rehabilitation Services Office [phone]   [name] 06/26/2022, 8:54 AM

## 2022-06-26 NOTE — Progress Notes (Signed)
Progress Note  Patient Name: Danielle Murray Date of Encounter: 06/26/2022  [facility] Cardiologist: Jenkins Rouge, MD   Subjective   Doing ok this am. Husband at bedside. Pt denies CP or dyspnea. Remains on 2L O2 per [place]  Inpatient Medications    Scheduled Meds:  albuterol  2.5 mg Nebulization BID   aspirin  81 mg Oral Daily   carvedilol  6.25 mg Oral BID WC   clopidogrel  75 mg Oral Q breakfast   docusate sodium  100 mg Oral BID   furosemide  80 mg Intravenous BID   heparin  5,000 Units Subcutaneous Q8H   insulin aspart  0-15 Units Subcutaneous TID WC   insulin aspart  0-5 Units Subcutaneous QHS   insulin glargine-yfgn  15 Units Subcutaneous BID   isosorbide mononitrate  30 mg Oral Daily   leptospermum manuka honey  1 Application Topical Daily   levothyroxine  88 mcg Oral QHS   losartan  12.5 mg Oral Daily   rosuvastatin  20 mg Oral Daily   sodium chloride flush  3 mL Intravenous Q12H   sodium chloride flush  3 mL Intravenous Q12H   sodium chloride flush  3 mL Intravenous Q12H   sodium chloride flush  3 mL Intravenous Q12H   spironolactone  12.5 mg Oral Daily   Continuous Infusions:  sodium chloride     sodium chloride     ceFEPime (MAXIPIME) IV 2 g (06/26/22 0655)   promethazine (PHENERGAN) injection (IM or IVPB)     PRN Meds: sodium chloride, sodium chloride, [DISCONTINUED] acetaminophen **OR** acetaminophen, acetaminophen, albuterol, bisacodyl, guaiFENesin, hydrALAZINE, melatonin, morphine injection, ondansetron (ZOFRAN) IV, ondansetron **OR** [DISCONTINUED] ondansetron (ZOFRAN) IV, mouth rinse, oxyCODONE, polyethylene glycol, simethicone, sodium chloride flush, sodium chloride flush   Vital Signs    Vitals:   06/25/22 2333 06/26/22 0422 06/26/22 0740 06/26/22 0755  BP: (!) 137/55 (!) 121/44  (!) 124/42  Pulse: (!) 59 (!) 54  (!) 57  Resp: '20 19  14  '$ Temp: 97.6 F (36.4 C) 97.8 F (36.6 C)  (!) 97.5 F (36.4 C)  TempSrc: Oral Oral  Oral  SpO2: 98% 98%  100% 100%  Weight:      Height:        Intake/Output Summary (Last 24 hours) at 06/26/2022 0910 Last data filed at 06/26/2022 0853 Gross per 24 hour  Intake 386.81 ml  Output 1900 ml  Net -1513.19 ml      06/19/2022    4:26 AM 06/02/2022    9:29 AM 06/01/2022    8:44 PM  Last 3 Weights  Weight (lbs) 187 lb 185 lb 180 lb  Weight (kg) 84.823 kg 83.915 kg 81.647 kg      Telemetry    Sinus rhythm with occasional PVC - Personally Reviewed  ECG    Sinus brady, age-indeterminate anterolateral infarct, prolonged QT - Personally Reviewed  Physical Exam  Alert, oriented, elderly woman in NAD GEN: No acute distress.   Neck: No JVD Cardiac: RRR, no murmurs, rubs, or gallops.  Respiratory: Clear to auscultation bilaterally. GI: Soft, nontender, non-distended  MS: No edema; No deformity. Right radial cath site clear Neuro:  Nonfocal  Psych: Normal affect   Labs    High Sensitivity Troponin:   Recent Labs  Lab 06/19/22 0430 06/19/22 0641  TROPONINIHS 2,421* 2,139*     Chemistry Recent Labs  Lab 06/24/22 0017 06/25/22 0144 06/26/22 0106  NA 137 137 137  K 3.7 3.8 3.7  CL  106 103 104  CO2 21* 22 24  GLUCOSE 182* 194* 167*  BUN 41* 40* 44*  CREATININE 1.21* 1.19* 1.16*  CALCIUM 8.7* 8.6* 8.4*  GFRNONAA 45* 46* 48*  ANIONGAP '10 12 9    '$ Lipids No results for input(s): "CHOL", "TRIG", "HDL", "LABVLDL", "LDLCALC", "CHOLHDL" in the last 168 hours.  Hematology Recent Labs  Lab 06/24/22 0017 06/25/22 0144 06/26/22 0106  WBC 8.0 10.1 9.8  RBC 3.58* 3.80* 3.51*  HGB 10.1* 10.7* 10.1*  HCT 31.0* 32.8* 30.5*  MCV 86.6 86.3 86.9  MCH 28.2 28.2 28.8  MCHC 32.6 32.6 33.1  RDW 14.5 14.4 14.4  PLT 389 425* 410*   Thyroid No results for input(s): "TSH", "FREET4" in the last 168 hours.  BNP Recent Labs  Lab 06/19/22 0921  BNP 1,579.3*    DDimer No results for input(s): "DDIMER" in the last 168 hours.   Radiology    CARDIAC CATHETERIZATION  Result Date:  06/25/2022 Conclusions: Successful shockwave assisted stent of the ostial to proximal circumflex reducing 70% stenosis to 10% with TIMI grade III flow.  4.0 x 15 mm stent postdilated to 4.5 cm in diameter with no apparent complications. RECOMMENDATIONS: Aspirin and Plavix for 6 months and then consider monotherapy with clopidogrel thereafter.    Cardiac Studies   As above  Patient Profile     81 y.o. female with CAD, diabetes, hypertension, SVT who was admitted on 06/19/2022 with acute respiratory failure secondary to pneumonia.  Course complicated by non-STEMI.  Assessment & Plan    NSTEMI: now s/p LCx PCI yesterday. Continues on ASA/clopidogrel.  Acute systolic CHF: continue carvedilol, losartan, and aldactone at current doses. LVEF 35-40. Will transition IV to oral lasix. Check into starting SGLT2 (if she can get as an OP) Anemia - stable, no active bleeding CKD 3a - stable  Will follow with you. Call if questions. thx  For questions or updates, please contact [place] Please consult [email or web address] for contact info under   Signed, Sherren Mocha, MD  06/26/2022, 9:10 AM

## 2022-06-26 NOTE — Progress Notes (Signed)
Physical Therapy Treatment Patient Details Name: Danielle Murray MRN: [id number] DOB: 21-Jun-1941 Today's Date: 06/26/2022   History of Present Illness 81 y.o. female [facility] hospital on [facility]/19/2022 with fever, SOB, and elevated troponins. Pt admitted fro management of NSTEMI and sepsis 2/2 PNA. S/P cath 8/14. Pt recently admitted 7/21-25 for hip fracture repair. PMH includes OA, CAD, depression, HTN, HLD, DMII.    PT Comments    Pt received supine and agreeable to session. Pt able to come to sitting EOB with mod assist to elevate trunk and cues to reach to bedrail with LUE. Pt able to come to standing EOB with mod assist with HHA on L. Once standing pt able to maintain static standing with min assist with cues to elevate trunk as pt with tendency for flexed posture. Pt with fair tolerance for LE therex however pt continues to be limited by LLE weakness and decreased activity tolerance. Educated pt on importance of continued mobility and activity recommendations with pt verbalizing understanding. Pt continues to benefit from skilled PT services to progress toward functional mobility goals.    Recommendations for follow up therapy are one component of a multi-disciplinary discharge planning process, led by the attending physician.  Recommendations may be updated based on patient status, additional functional criteria and insurance authorization.  Follow Up Recommendations  Skilled nursing-short term rehab (<3 hours/day) Can patient physically be transported by private vehicle: No   Assistance Recommended at Discharge Intermittent Supervision/Assistance  Patient can return home with the following A lot of help with walking and/or transfers;A lot of help with bathing/dressing/bathroom;Assistance with cooking/housework;Assist for transportation;Help with stairs or ramp for entrance   Equipment Recommendations  Wheelchair (measurements PT);BSC/3in1    Recommendations for Other Services        Precautions / Restrictions Precautions Precautions: Fall Restrictions Weight Bearing Restrictions: Yes LLE Weight Bearing: Weight bearing as tolerated     Mobility  Bed Mobility Overal bed mobility: Needs Assistance Bed Mobility: Supine to Sit, Sit to Sidelying, Rolling Rolling: Min assist   Supine to sit: Mod assist     General bed mobility comments: L LE and trunk support to progress to EOB, increased time and cueing for sequencing    Transfers Overall transfer level: Needs assistance Equipment used: 1 person hand held assist Transfers: Sit to/from Stand Sit to Stand: Mod assist           General transfer comment: mod assist to come to standing x2 trials with HHA on L side, once standing min assist to maintain staic standing with cues for upright posture    Ambulation/Gait                   Stairs             Wheelchair Mobility    Modified Rankin (Stroke Patients Only)       Balance Overall balance assessment: Needs assistance Sitting-balance support: No upper extremity supported, Feet supported Sitting balance-Leahy Scale: Fair Sitting balance - Comments: min guard for safety   Standing balance support: Single extremity supported Standing balance-Leahy Scale: Poor                              Cognition Arousal/Alertness: Awake/alert Behavior During Therapy: Anxious Overall Cognitive Status: Within Functional Limits for tasks assessed  General Comments: able to follow commands and engage appropriately        Exercises General Exercises - Lower Extremity Long Arc Quad: AROM, Left, Right, 10 reps, Seated Hip Flexion/Marching: AROM, Right, Left, 20 reps, Seated (x10 LLE in standing)    General Comments General comments (skin integrity, edema, etc.): VSS on 2L      Pertinent Vitals/Pain Pain Assessment Pain Assessment: Faces Faces Pain Scale: Hurts a little bit Pain  Location: R arm Pain Descriptors / Indicators: Guarding, Grimacing, Sore Pain Intervention(s): Monitored during session, Limited activity within patient's tolerance, Repositioned    Home Living                          Prior Function            PT Goals (current goals can now be found in the care plan section) Acute Rehab PT Goals PT Goal Formulation: With patient Time For Goal Achievement: 07/08/22    Frequency    Min 3X/week      PT Plan      Co-evaluation              AM-PAC PT "6 Clicks" Mobility   Outcome Measure  Help needed turning from your back to your side while in a flat bed without using bedrails?: A Little Help needed moving from lying on your back to sitting on the side of a flat bed without using bedrails?: A Lot Help needed moving to and from a bed to a chair (including a wheelchair)?: A Lot Help needed standing up from a chair using your arms (e.g., wheelchair or bedside chair)?: A Lot Help needed to walk in hospital room?: Total Help needed climbing 3-5 steps with a railing? : Total 6 Click Score: 11    End of Session Equipment Utilized During Treatment: Oxygen Activity Tolerance: Patient tolerated treatment well Patient left: in bed;with call bell/phone within reach;with family/visitor present Nurse Communication: Mobility status PT Visit Diagnosis: Unsteadiness on feet (R26.81);Pain;Difficulty in walking, not elsewhere classified (R26.2) Pain - Right/Left: Left Pain - part of body: Hip;Leg     Time: 1275-1700 PT Time Calculation (min) (ACUTE ONLY): 26 min  Charges:  $Therapeutic Exercise: 8-22 mins $Therapeutic Activity: 8-22 mins                    Caren Garske R. PTA Acute Rehabilitation Services Office: [place] [facility] 06/26/2022, 3:49 PM

## 2022-06-26 NOTE — Evaluation (Signed)
Occupational Therapy Evaluation Patient Details Name: Danielle Murray MRN: [id number] DOB: 06-Nov-1941 Today's Date: 06/26/2022   History of Present Illness 81 y.o. female presents to [facility] hospital on 06/19/2022 with fever, SOB, and elevated troponins. Pt admitted fro management of NSTEMI and sepsis 2/2 PNA. S/P cath 8/14. Pt recently admitted 7/21-25 for hip fracture repair. PMH includes OA, CAD, depression, HTN, HLD, DMII.   Clinical Impression   Patient admitted for above and presents with problem list below, including decreased activity tolerance, generalized weakness, impaired balance. Pt reports prior to hip fx, she was independent but since has been in SNF for short term rehab- was needing up to mod assist for ADLs and started walking with RW. Today, pt completes bed mobility with mod assist, sitting EOB with min guard reports lightheaded but BP stable.  Patient completing ADLs with min guard to total assist.  Based on performance today, believe she will benefit from continued OT services acutely and after dc at SNF level to optimize return to PLOF and tolerance for activity.      Recommendations for follow up therapy are one component of a multi-disciplinary discharge planning process, led by the attending physician.  Recommendations may be updated based on patient status, additional functional criteria and insurance authorization.   Follow Up Recommendations  Skilled nursing-short term rehab (<3 hours/day)    Assistance Recommended at Discharge Frequent or constant Supervision/Assistance  Patient can return home with the following A lot of help with bathing/dressing/bathroom;Assistance with cooking/housework;Direct supervision/assist for medications management;Direct supervision/assist for financial management;Assist for transportation;Help with stairs or ramp for entrance;Two people to help with walking and/or transfers    Functional Status Assessment  Patient has had a recent decline in  their functional status and demonstrates the ability to make significant improvements in function in a reasonable and predictable amount of time.  Equipment Recommendations  Other (comment) (defer)    Recommendations for Other Services       Precautions / Restrictions Precautions Precautions: Fall Restrictions Weight Bearing Restrictions: Yes LLE Weight Bearing: Weight bearing as tolerated      Mobility Bed Mobility Overal bed mobility: Needs Assistance Bed Mobility: Supine to Sit, Sit to Sidelying, Rolling Rolling: Min assist   Supine to sit: Mod assist, HOB elevated   Sit to sidelying: Mod assist General bed mobility comments: L LE and trunk support to progress to EOB, increased time and cueing for sequencing    Transfers                   General transfer comment: pt declined      Balance Overall balance assessment: Needs assistance Sitting-balance support: No upper extremity supported, Feet supported Sitting balance-Leahy Scale: Fair Sitting balance - Comments: min guard for safety                                   ADL either performed or assessed with clinical judgement   ADL Overall ADL's : Needs assistance/impaired     Grooming: Set up;Sitting           Upper Body Dressing : Minimal assistance;Sitting   Lower Body Dressing: Total assistance;Sitting/lateral leans;Bed level     Toilet Transfer Details (indicate cue type and reason): deferred         Functional mobility during ADLs: Moderate assistance       Vision   Vision Assessment?: No apparent visual deficits  Perception     Praxis      Pertinent Vitals/Pain Pain Assessment Pain Assessment: No/denies pain Faces Pain Scale: No hurt Pain Intervention(s): Monitored during session     Hand Dominance Right   Extremity/Trunk Assessment Upper Extremity Assessment Upper Extremity Assessment: Generalized weakness (limited R UE movement due to recent cath)    Lower Extremity Assessment Lower Extremity Assessment: Defer to PT evaluation   Cervical / Trunk Assessment Cervical / Trunk Assessment: Kyphotic   Communication Communication Communication: No difficulties   Cognition Arousal/Alertness: Awake/alert Behavior During Therapy: Anxious Overall Cognitive Status: Within Functional Limits for tasks assessed                                 General Comments: able to follow commands and engage appropriately     General Comments  VSS on 2L, BP stable but lightheaded sitting EOB    Exercises     Shoulder Instructions      Home Living Family/patient expects to be discharged to:: Skilled nursing facility                                 Additional Comments: hopeful to return to [facility]      Prior Functioning/Environment Prior Level of Function : Independent/Modified Independent             Mobility Comments: pt reports she had progressed to ambulating with a RW for ~90 steps at SNF since femur fx; prior to fx, pt furniture walker ADLs Comments: pt completing ADLs in SNF with mod assist for LB, but able to complete UB--prior to independent        OT Problem List: Decreased strength;Decreased range of motion;Decreased activity tolerance;Impaired balance (sitting and/or standing);Decreased safety awareness;Pain      OT Treatment/Interventions: Self-care/ADL training;Therapeutic exercise;DME and/or AE instruction;Therapeutic activities;Balance training;Patient/family education    OT Goals(Current goals can be found in the care plan section) Acute Rehab OT Goals Patient Stated Goal: get better, get back to rehab OT Goal Formulation: With patient Time For Goal Achievement: 07/10/22 Potential to Achieve Goals: Good  OT Frequency: Min 2X/week    Co-evaluation              AM-PAC OT "6 Clicks" Daily Activity     Outcome Measure Help from another person eating meals?: None Help from  another person taking care of personal grooming?: A Little Help from another person toileting, which includes using toliet, bedpan, or urinal?: Total Help from another person bathing (including washing, rinsing, drying)?: A Lot Help from another person to put on and taking off regular upper body clothing?: A Little Help from another person to put on and taking off regular lower body clothing?: Total 6 Click Score: 14   End of Session Equipment Utilized During Treatment: Oxygen Nurse Communication: Mobility status  Activity Tolerance: Patient tolerated treatment well Patient left: in bed;with call bell/phone within reach;with bed alarm set;with family/visitor present  OT Visit Diagnosis: Unsteadiness on feet (R26.81);Other abnormalities of gait and mobility (R26.89);Muscle weakness (generalized) (M62.81);History of falling (Z91.81)                Time: 9767-3419 OT Time Calculation (min): 26 min Charges:  OT General Charges $OT Visit: 1 Visit OT Evaluation $OT Eval Moderate Complexity: 1 Mod OT Treatments $Self Care/Home Management : 8-22 mins  Jolaine Artist, OT Acute Rehabilitation  Services Office [phone]   [name] 06/26/2022, 12:28 PM

## 2022-06-26 NOTE — Progress Notes (Signed)
PROGRESS NOTE    Danielle CORONA  Murray:[id number] DOB: [birth year] DOA: 06/19/2022 PCP: [name], MD    Chief Complaint  Patient presents with   Fever    Brief Narrative:   Danielle Murray is a 81 y.o. female with medical history significant of CAD s/p stent; HTN; HLD; hypothyroidism; and DM presenting with fever. She was last admitted from 7/21-25 for hip fracture s/p repair.  She has been doing well with her rehab but became SOB a day or two ago.  She has had SOB periodically prior to last hospitalization, would have to sit down and rest with exertion.  Worse in the last 2 days significantly.  She has had mild pain in the back of her shoulder blades and pressure in her substernal region, "not real bad though."  She developed fever in the last 2 days, max 102.  No cough.  She was wheezing early this AM.  No urinary symptoms.   - her work up significant NSTEMI, and pneumonia, she is admitted for further workup. -Work-up significant for respiratory failure, felt to be secondary to volume overload and pneumonia, cath significant for ostial proximal circumflex disease, which did require intervention at a later date with shockwave followed by stent placement.  Assessment & Plan:   Principal Problem:   NSTEMI (non-ST elevated myocardial infarction) ([place]) Active Problems:   Hypothyroidism   Essential hypertension   Uncontrolled type 2 diabetes mellitus with hyperglycemia, with long-term current use of insulin (HCC)   CAD (coronary artery disease), native coronary artery   Dyslipidemia   Status post surgery   Pressure injury of skin   Acute on chronic combined systolic and diastolic HF (heart failure) (HCC)  Pneumonia Acute hypoxemic respiratory failure -Patient presenting with cough, fever to 102, mildly decreased oxygen saturation, and infiltrate in right lower lobe on chest x-ray, as well as significantly elevated procalcitonin, she has been treated for up pneumonia. -MRSA negative,  vancomycin stopped, treated with cefepime and azithromycin, to finish total 7 days. -Improving, he is down from 7 to 2 L oxygen requirement today -Keep encouraging to use incentive spirometry and flutter valve  Acute systolic CHF -Echo showing drop in EF 35 to 40% with wall motion abnormalities -Cardiology, continue with IV diuresis, she is -6.6 L since admission -Continue with Coreg, Aldactone, and losartan  Non- stemi -Troponin significantly elevated, peaked at 2400, and EKG with acute changes, 2D echo shows decline in her EF 35 to 40%. -On heparin gtt. initially -Treatment Per cardiology, cardiac cath on 8/10 significant for moderate three-vessel coronary artery disease, went for another cardiac cath/14, however she had had ostial proximal circumflex lesion treated with shockwave and stent. -Continue with Coreg, aspirin, Crestor and Imdur, started on plavix  as well.  Acute on chronic blood loss anemia -Received 1 unit PRBC 8/9, especially in the setting of symptomatic anemia and MI, hemoglobin stable.  HTN -Continue carvedilol, amlodipine -on PRN hydralazine    HLD -Continue Crestor -Check lipids   DM -A1c is 7.5, suboptimal control -Hold Jardiance -Switch 70/30 to sem-glee for now -Will cover with moderate-scale SSI for now   Hypothyroidism -Continue Synthroid   Recent hip fracture -Appears to be doing well from this standpoint -She is likely to need to return to rehab following this hospitalization -/OT     Obesity Body mass index is 32.1 kg/m.  Pressure ulcer -Present on admission -Consulted wound care  Pressure Injury 06/19/22 Buttocks Left Stage 2 -  Partial thickness loss of  dermis presenting as a shallow open injury with a red, pink wound bed without slough. (Active)  06/19/22 1202  Location: Buttocks  Location Orientation: Left  Staging: Stage 2 -  Partial thickness loss of dermis presenting as a shallow open injury with a red, pink wound bed without  slough.  Wound Description (Comments):   Present on Admission: Yes       DVT prophylaxis: Heparin GTT>> [place] heparin Code Status: Full Family Communication: D/W husband at bedside daily Disposition: We will need SNF, hopefully in 1 to 2 days .  Status is: Inpatient    Consultants:  Cardiology PCCM  Subjective:  No significant events overnight, patient reports she is feeling much better, dyspnea has improved, she denies any chest pain or cough.     Objective: Vitals:   06/25/22 2333 06/26/22 0422 06/26/22 0740 06/26/22 0755  BP: (!) 137/55 (!) 121/44  (!) 124/42  Pulse: (!) 59 (!) 54  (!) 57  Resp: '20 19  14  '$ Temp: 97.6 F (36.4 C) 97.8 F (36.6 C)  (!) 97.5 F (36.4 C)  TempSrc: Oral Oral  Oral  SpO2: 98% 98% 100% 100%  Weight:      Height:        Intake/Output Summary (Last 24 hours) at 06/26/2022 0936 Last data filed at 06/26/2022 0853 Gross per 24 hour  Intake 386.81 ml  Output 1900 ml  Net -1513.19 ml   Filed Weights   06/19/22 0426  Weight: 84.8 kg    Examination:  Awake Alert, Oriented X 3, No new F.N deficits, Normal affect Symmetrical Chest wall movement, diminished  air entry at the bases RRR,No Gallops,Rubs or new Murmurs, No Parasternal Heave +ve B.Sounds, Abd Soft, No tenderness, No rebound - guarding or rigidity. No Cyanosis, Clubbing or edema, No new Rash or bruise           Data Reviewed: I have personally reviewed following labs and imaging studies  CBC: Recent Labs  Lab 06/22/22 0049 06/23/22 0025 06/24/22 0017 06/25/22 0144 06/26/22 0106  WBC 9.8 8.8 8.0 10.1 9.8  HGB 9.0* 9.3* 10.1* 10.7* 10.1*  HCT 29.0* 28.4* 31.0* 32.8* 30.5*  MCV 89.2 86.6 86.6 86.3 86.9  PLT 298 332 389 425* 410*    Basic Metabolic Panel: Recent Labs  Lab 06/22/22 0049 06/23/22 0025 06/24/22 0017 06/25/22 0144 06/26/22 0106  NA 134* 136 137 137 137  K 4.2 3.9 3.7 3.8 3.7  CL 109 107 106 103 104  CO2 16* 19* 21* 22 24  GLUCOSE 130*  163* 182* 194* 167*  BUN 38* 42* 41* 40* 44*  CREATININE 1.27* 1.22* 1.21* 1.19* 1.16*  CALCIUM 7.9* 8.2* 8.7* 8.6* 8.4*    GFR: Estimated Creatinine Clearance: 40.7 mL/min (A) (by C-G formula based on SCr of 1.16 mg/dL (H)).  Liver Function Tests: No results for input(s): "AST", "ALT", "ALKPHOS", "BILITOT", "PROT", "ALBUMIN" in the last 168 hours.   CBG: Recent Labs  Lab 06/25/22 0602 06/25/22 1314 06/25/22 1638 06/25/22 2118 06/26/22 0639  GLUCAP 180* 138* 199* 231* 173*     Recent Results (from the past 240 hour(s))  Resp Panel by RT-PCR (Flu A&B, Covid) Peripheral     Status: None   Collection Time: 06/19/22  4:28 AM   Specimen: Peripheral; Nasal Swab  Result Value Ref Range Status   SARS Coronavirus 2 by RT PCR NEGATIVE NEGATIVE Final    Comment: (NOTE) SARS-CoV-2 target nucleic acids are NOT DETECTED.  The SARS-CoV-2 RNA is generally  detectable in upper respiratory specimens during the acute phase of infection. The lowest concentration of SARS-CoV-2 viral copies this assay can detect is 138 copies/mL. A negative result does not preclude SARS-Cov-2 infection and should not be used as the sole basis for treatment or other patient management decisions. A negative result may occur with  improper specimen collection/handling, submission of specimen other than nasopharyngeal swab, presence of viral mutation(s) within the areas targeted by this assay, and inadequate number of viral copies(<138 copies/mL). A negative result must be combined with clinical observations, patient history, and epidemiological information. The expected result is Negative.  Fact Sheet for Patients:  EntrepreneurPulse.com.au  Fact Sheet for Healthcare Providers:  IncredibleEmployment.be  This test is no t yet approved or cleared by the [place] FDA and  has been authorized for detection and/or diagnosis of SARS-CoV-2 by FDA under an Emergency Use  Authorization (EUA). This EUA will remain  in effect (meaning this test can be used) for the duration of the COVID-19 declaration under Section 564(b)(1) of the Act, 21 U.S.C.section 360bbb-3(b)(1), unless the authorization is terminated  or revoked sooner.       Influenza A by PCR NEGATIVE NEGATIVE Final   Influenza B by PCR NEGATIVE NEGATIVE Final    Comment: (NOTE) The Xpert Xpress SARS-CoV-2/FLU/RSV plus assay is intended as an aid in the diagnosis of influenza from Nasopharyngeal swab specimens and should not be used as a sole basis for treatment. Nasal washings and aspirates are unacceptable for Xpert Xpress SARS-CoV-2/FLU/RSV testing.  Fact Sheet for Patients: EntrepreneurPulse.com.au  Fact Sheet for Healthcare Providers: IncredibleEmployment.be  This test is not yet approved or cleared by the [place] FDA and has been authorized for detection and/or diagnosis of SARS-CoV-2 by FDA under an Emergency Use Authorization (EUA). This EUA will remain in effect (meaning this test can be used) for the duration of the COVID-19 declaration under Section 564(b)(1) of the Act, 21 U.S.C. section 360bbb-3(b)(1), unless the authorization is terminated or revoked.  Performed at [facility], [place] [address] [place], [place] [address]   Blood Culture (routine x 2)     Status: None   Collection Time: 06/19/22  4:30 AM   Specimen: BLOOD RIGHT WRIST  Result Value Ref Range Status   Specimen Description BLOOD RIGHT WRIST  Final   Special Requests   Final    BOTTLES DRAWN AEROBIC AND ANAEROBIC Blood Culture adequate volume   Culture   Final    NO GROWTH 5 DAYS Performed at [facility], [place] [address], [place], [place] [address]    Report Status 06/24/2022 FINAL  Final  Blood Culture (routine x 2)     Status: None   Collection Time: 06/19/22  4:36 AM   Specimen: BLOOD LEFT HAND  Result Value Ref Range Status   Specimen Description  BLOOD LEFT HAND  Final   Special Requests   Final    BOTTLES DRAWN AEROBIC AND ANAEROBIC Blood Culture results may not be optimal due to an inadequate volume of blood received in culture bottles   Culture   Final    NO GROWTH 5 DAYS Performed at [facility], [place] [address], [place], [place] [address]    Report Status 06/24/2022 FINAL  Final  Urine Culture     Status: Abnormal   Collection Time: 06/19/22  4:54 AM   Specimen: In/Out Cath Urine  Result Value Ref Range Status   Specimen Description IN/OUT CATH URINE  Final  Special Requests   Final    NONE Performed at [facility], [place] [address] [place], [place] [address]    Culture >=100,000 COLONIES/mL ESCHERICHIA COLI (A)  Final   Report Status 06/21/2022 FINAL  Final   Organism ID, Bacteria ESCHERICHIA COLI (A)  Final      Susceptibility   Escherichia coli - MIC*    AMPICILLIN <=2 SENSITIVE Sensitive     CEFAZOLIN <=4 SENSITIVE Sensitive     CEFEPIME <=0.12 SENSITIVE Sensitive     CEFTRIAXONE <=0.25 SENSITIVE Sensitive     CIPROFLOXACIN <=0.25 SENSITIVE Sensitive     GENTAMICIN <=1 SENSITIVE Sensitive     IMIPENEM <=0.25 SENSITIVE Sensitive     NITROFURANTOIN <=16 SENSITIVE Sensitive     TRIMETH/SULFA <=20 SENSITIVE Sensitive     AMPICILLIN/SULBACTAM <=2 SENSITIVE Sensitive     PIP/TAZO <=4 SENSITIVE Sensitive     * >=100,000 COLONIES/mL ESCHERICHIA COLI  MRSA Next Gen by PCR, Nasal     Status: None   Collection Time: 06/20/22  3:58 PM   Specimen: Nasal Mucosa; Nasal Swab  Result Value Ref Range Status   MRSA by PCR Next Gen NOT DETECTED NOT DETECTED Final    Comment: (NOTE) The GeneXpert MRSA Assay (FDA approved for NASAL specimens only), is one component of a comprehensive MRSA colonization surveillance program. It is not intended to diagnose MRSA infection nor to guide or monitor treatment for MRSA infections. Test performance is not FDA approved in patients less than 91 years old. Performed at [facility], [place] [address], [place], [place] [address]          Radiology Studies: CARDIAC CATHETERIZATION  Result Date: 06/25/2022 Conclusions: Successful shockwave assisted stent of the ostial to proximal circumflex reducing 70% stenosis to 10% with TIMI grade III flow.  4.0 x 15 mm stent postdilated to 4.5 cm in diameter with no apparent complications. RECOMMENDATIONS: Aspirin and Plavix for 6 months and then consider monotherapy with clopidogrel thereafter.        Scheduled Meds:  albuterol  2.5 mg Nebulization BID   aspirin  81 mg Oral Daily   carvedilol  6.25 mg Oral BID WC   clopidogrel  75 mg Oral Q breakfast   docusate sodium  100 mg Oral BID   furosemide  80 mg Intravenous BID   heparin  5,000 Units Subcutaneous Q8H   insulin aspart  0-15 Units Subcutaneous TID WC   insulin aspart  0-5 Units Subcutaneous QHS   insulin glargine-yfgn  15 Units Subcutaneous BID   isosorbide mononitrate  30 mg Oral Daily   leptospermum manuka honey  1 Application Topical Daily   levothyroxine  88 mcg Oral QHS   losartan  12.5 mg Oral Daily   rosuvastatin  20 mg Oral Daily   sodium chloride flush  3 mL Intravenous Q12H   sodium chloride flush  3 mL Intravenous Q12H   sodium chloride flush  3 mL Intravenous Q12H   sodium chloride flush  3 mL Intravenous Q12H   spironolactone  12.5 mg Oral Daily   Continuous Infusions:  sodium chloride     sodium chloride     ceFEPime (MAXIPIME) IV 2 g (06/26/22 0655)   promethazine (PHENERGAN) injection (IM or IVPB)       LOS: 7 days      Phillips Climes, MD [facility]   To contact the attending provider between 7A-7P or the covering provider during after hours 7P-7A, please log into the  web site [email or web address] and access using universal [place] password for that web site. If you do not have the password, please call the hospital operator.  06/26/2022, 9:36 AM

## 2022-06-26 NOTE — Social Work (Signed)
Pt is from [facility], pt is able to return CSW will start auth when pt is close to medical stability.

## 2022-06-27 DIAGNOSIS — I214 Non-ST elevation (NSTEMI) myocardial infarction: Secondary | ICD-10-CM | POA: Diagnosis not present

## 2022-06-27 LAB — CBC
HCT: 31.7 % — ABNORMAL LOW (ref 36.0–46.0)
Hemoglobin: 10.1 g/dL — ABNORMAL LOW (ref 12.0–15.0)
MCH: 27.9 pg (ref 26.0–34.0)
MCHC: 31.9 g/dL (ref 30.0–36.0)
MCV: 87.6 fL (ref 80.0–100.0)
Platelets: 387 10*3/uL (ref 150–400)
RBC: 3.62 MIL/uL — ABNORMAL LOW (ref 3.87–5.11)
RDW: 14.4 % (ref 11.5–15.5)
WBC: 10.1 10*3/uL (ref 4.0–10.5)
nRBC: 0 % (ref 0.0–0.2)

## 2022-06-27 LAB — GLUCOSE, CAPILLARY
Glucose-Capillary: 178 mg/dL — ABNORMAL HIGH (ref 70–99)
Glucose-Capillary: 139 mg/dL — ABNORMAL HIGH (ref 70–99)
Glucose-Capillary: 224 mg/dL — ABNORMAL HIGH (ref 70–99)
Glucose-Capillary: 253 mg/dL — ABNORMAL HIGH (ref 70–99)
Glucose-Capillary: 180 mg/dL — ABNORMAL HIGH (ref 70–99)
Glucose-Capillary: 231 mg/dL — ABNORMAL HIGH (ref 70–99)

## 2022-06-27 LAB — BASIC METABOLIC PANEL
Anion gap: 10 (ref 5–15)
BUN: 46 mg/dL — ABNORMAL HIGH (ref 8–23)
CO2: 23 mmol/L (ref 22–32)
Calcium: 8.6 mg/dL — ABNORMAL LOW (ref 8.9–10.3)
Chloride: 103 mmol/L (ref 98–111)
Creatinine, Ser: 1.3 mg/dL — ABNORMAL HIGH (ref 0.44–1.00)
GFR, Estimated: 42 mL/min — ABNORMAL LOW (ref >60)
Glucose, Bld: 183 mg/dL — ABNORMAL HIGH (ref 70–99)
Potassium: 3.9 mmol/L (ref 3.5–5.1)
Sodium: 136 mmol/L (ref 135–145)

## 2022-06-27 MED ORDER — SENNOSIDES-DOCUSATE SODIUM 8.6-50 MG PO TABS
1.0000 | ORAL_TABLET | Freq: Two times a day (BID) | ORAL | Status: DC
  Administered 2022-06-27 – 2022-06-28 (×3): 1 via ORAL
  Filled 2022-06-27 (×3): qty 1

## 2022-06-27 MED ORDER — NA FERRIC GLUC CPLX IN SUCROSE 12.5 MG/ML IV SOLN
250.0000 mg | Freq: Every day | INTRAVENOUS | Status: AC
  Administered 2022-06-27 – 2022-06-28 (×2): 250 mg via INTRAVENOUS
  Filled 2022-06-27 (×2): qty 20

## 2022-06-27 MED ORDER — POLYSACCHARIDE IRON COMPLEX 150 MG PO CAPS
150.0000 mg | ORAL_CAPSULE | Freq: Every day | ORAL | Status: DC
  Administered 2022-06-28: 150 mg via ORAL
  Filled 2022-06-27: qty 1

## 2022-06-27 NOTE — Plan of Care (Signed)
  Problem: Education: Goal: Understanding of CV disease, CV risk reduction, and recovery process will improve Outcome: Progressing   Problem: Activity: Goal: Ability to return to baseline activity level will improve Outcome: Not Progressing   Problem: Cardiovascular: Goal: Ability to achieve and maintain adequate cardiovascular perfusion will improve Outcome: Progressing Goal: Vascular access site(s) Level 0-1 will be maintained Outcome: Progressing   Problem: Health Behavior/Discharge Planning: Goal: Ability to safely manage health-related needs after discharge will improve Outcome: Not Progressing   Problem: Clinical Measurements: Goal: Ability to maintain a body temperature in the normal range will improve Outcome: Progressing   Problem: Respiratory: Goal: Ability to maintain adequate ventilation will improve Outcome: Progressing Goal: Ability to maintain a clear airway will improve Outcome: Progressing   Problem: Education: Goal: Knowledge of General Education information will improve Description: Including pain rating scale, medication(s)/side effects and non-pharmacologic comfort measures Outcome: Progressing   Problem: Clinical Measurements: Goal: Ability to maintain clinical measurements within normal limits will improve Outcome: Progressing Goal: Will remain free from infection Outcome: Progressing Goal: Diagnostic test results will improve Outcome: Progressing Goal: Respiratory complications will improve Outcome: Progressing Goal: Cardiovascular complication will be avoided Outcome: Progressing   Problem: Activity: Goal: Risk for activity intolerance will decrease Outcome: Not Progressing   Problem: Nutrition: Goal: Adequate nutrition will be maintained Outcome: Progressing   Problem: Coping: Goal: Level of anxiety will decrease Outcome: Progressing   Problem: Elimination: Goal: Will not experience complications related to bowel motility Outcome:  Progressing Goal: Will not experience complications related to urinary retention Outcome: Progressing   Problem: Pain Managment: Goal: General experience of comfort will improve Outcome: Progressing   Problem: Safety: Goal: Ability to remain free from injury will improve Outcome: Progressing   Problem: Skin Integrity: Goal: Risk for impaired skin integrity will decrease Outcome: Progressing

## 2022-06-27 NOTE — TOC Progression Note (Addendum)
Transition of Care [facility]) - Progression Note    Patient Details  Name: PREET MANGANO MRN: [id number] Date of Birth: Jan 16, 1941  Transition of Care [facility]) CM/SW Contact  Reece Agar, [place] Phone Number: 06/27/2022, 1:14 PM  Clinical Narrative:    CSW spoke with pt spouse who has some concerns about being able to go back to [facility] bc he received call stating they would not hold the  bed any longer. CSW contacted [facility] to see when the last day that they would be able to hold the bed would be, no answer. CSW left a VM and will continue to follow for DC planning.   [facility] contacted CSW stating that they would love to have the pt back they are just needing a DC date bc they were not able to hold ab ed but they would need to get her a new room. CSW informed [facility] that [facility] has been started and there is a possible DC for tomorrow. They are agreeable with DC for tomorrow if [facility] is approved.  Auth approved for SNF and transportation ref# [id number] and [phone]   Expected Discharge Plan: [place] Barriers to Discharge: Continued Medical Work up  Expected Discharge Plan and Services Expected Discharge Plan: [place] In-house Referral: Clinical Social Work   Post Acute Care Choice: [facility] arrangements for the past 2 months: [place]                                       Social Determinants of Health (SDOH) Interventions    Readmission Risk Interventions     No data to display

## 2022-06-27 NOTE — Progress Notes (Signed)
Progress Note  Patient Name: Danielle Murray Date of Encounter: 06/27/2022  [facility] Cardiologist: Jenkins Rouge, MD   Subjective   Feeling better today.  No chest pain or shortness of breath.  Weaning off of oxygen, currently on only 0.5 L with good oxygen saturations.  Concerned about transferring out of the hospital today as she still feels weak and has some concerns about constipation.  Inpatient Medications    Scheduled Meds:  albuterol  2.5 mg Nebulization BID   aspirin  81 mg Oral Daily   carvedilol  6.25 mg Oral BID WC   clopidogrel  75 mg Oral Q breakfast   empagliflozin  10 mg Oral Daily   furosemide  40 mg Oral Daily   heparin  5,000 Units Subcutaneous Q8H   insulin aspart  0-15 Units Subcutaneous TID WC   insulin aspart  0-5 Units Subcutaneous QHS   insulin glargine-yfgn  15 Units Subcutaneous BID   isosorbide mononitrate  30 mg Oral Daily   leptospermum manuka honey  1 Application Topical Daily   levothyroxine  88 mcg Oral QHS   losartan  12.5 mg Oral Daily   rosuvastatin  20 mg Oral Daily   senna-docusate  1 tablet Oral BID   sodium chloride flush  3 mL Intravenous Q12H   sodium chloride flush  3 mL Intravenous Q12H   sodium chloride flush  3 mL Intravenous Q12H   sodium chloride flush  3 mL Intravenous Q12H   spironolactone  12.5 mg Oral Daily   Continuous Infusions:  sodium chloride     sodium chloride     promethazine (PHENERGAN) injection (IM or IVPB)     PRN Meds: sodium chloride, sodium chloride, [DISCONTINUED] acetaminophen **OR** acetaminophen, acetaminophen, albuterol, bisacodyl, guaiFENesin, hydrALAZINE, melatonin, ondansetron (ZOFRAN) IV, ondansetron **OR** [DISCONTINUED] ondansetron (ZOFRAN) IV, mouth rinse, oxyCODONE, polyethylene glycol, simethicone, sodium chloride flush, sodium chloride flush   Vital Signs    Vitals:   06/26/22 2019 06/26/22 2312 06/27/22 0409 06/27/22 0743  BP: (!) 126/52 (!) 115/47 128/62 (!) 129/47  Pulse: (!)  50 62 61 (!) 56  Resp: '14 20 20 13  '$ Temp: 98.2 F (36.8 C) 98.6 F (37 C) 98.3 F (36.8 C) 98.4 F (36.9 C)  TempSrc: Oral Oral Oral Oral  SpO2: 97% 96% 97% 95%  Weight:      Height:        Intake/Output Summary (Last 24 hours) at 06/27/2022 0834 Last data filed at 06/27/2022 0300 Gross per 24 hour  Intake --  Output 2050 ml  Net -2050 ml      06/19/2022    4:26 AM 06/02/2022    9:29 AM 06/01/2022    8:44 PM  Last 3 Weights  Weight (lbs) 187 lb 185 lb 180 lb  Weight (kg) 84.823 kg 83.915 kg 81.647 kg      Telemetry    Sinus rhythm without significant arrhythmia, few PVCs noted.- Personally Reviewed  Physical Exam  Alert, oriented, no distress GEN: No acute distress.   Neck: No JVD Cardiac: RRR, no murmurs, rubs, or gallops.  Respiratory: Clear to auscultation bilaterally. GI: Soft, nontender, non-distended  MS: No edema; No deformity. Neuro:  Nonfocal  Psych: Normal affect   Labs    High Sensitivity Troponin:   Recent Labs  Lab 06/19/22 0430 06/19/22 0641  TROPONINIHS 2,421* 2,139*     Chemistry Recent Labs  Lab 06/25/22 0144 06/26/22 0106 06/27/22 0100  NA 137 137 136  K 3.8 3.7  3.9  CL 103 104 103  CO2 '22 24 23  '$ GLUCOSE 194* 167* 183*  BUN 40* 44* 46*  CREATININE 1.19* 1.16* 1.30*  CALCIUM 8.6* 8.4* 8.6*  GFRNONAA 46* 48* 42*  ANIONGAP '12 9 10    '$ Lipids No results for input(s): "CHOL", "TRIG", "HDL", "LABVLDL", "LDLCALC", "CHOLHDL" in the last 168 hours.  Hematology Recent Labs  Lab 06/25/22 0144 06/26/22 0106 06/27/22 0100  WBC 10.1 9.8 10.1  RBC 3.80* 3.51* 3.62*  HGB 10.7* 10.1* 10.1*  HCT 32.8* 30.5* 31.7*  MCV 86.3 86.9 87.6  MCH 28.2 28.8 27.9  MCHC 32.6 33.1 31.9  RDW 14.4 14.4 14.4  PLT 425* 410* 387   Thyroid No results for input(s): "TSH", "FREET4" in the last 168 hours.  BNPNo results for input(s): "BNP", "PROBNP" in the last 168 hours.  DDimer No results for input(s): "DDIMER" in the last 168 hours.   Radiology     CARDIAC CATHETERIZATION  Result Date: 06/25/2022 Conclusions: Successful shockwave assisted stent of the ostial to proximal circumflex reducing 70% stenosis to 10% with TIMI grade III flow.  4.0 x 15 mm stent postdilated to 4.5 cm in diameter with no apparent complications. RECOMMENDATIONS: Aspirin and Plavix for 6 months and then consider monotherapy with clopidogrel thereafter.     Patient Profile     81 y.o. female with CAD, diabetes, hypertension, SVT who was admitted on 06/19/2022 with acute respiratory failure secondary to pneumonia.  Course complicated by non-STEMI.  Assessment & Plan    NSTEMI: treated with ASA, clopidogrel, coreg, isosorbide, and rosuvastatin. Pt treated with PCI of a dominant LCx, moderate nonobstructive disease in the LAD.  Acute systolic heart failure: treated with carvedilol, losartan, and aldactone. Jardiance added. Lasix transitioned to oral dosing. Daily weights.  CKD, stage 3a, stable on current Rx.   Patient appears stable and is on a good regimen for her congestive heart failure and coronary artery disease.  Will arrange cardiology follow-up in the outpatient setting.  [facility] will sign off.   Medication Recommendations: Continue current medical therapy as outlined above Other recommendations (labs, testing, etc): None Follow up as an outpatient: We will arrange with Dr. Johnsie Cancel or his APP  For questions or updates, please contact [facility] Please consult [email or web address] for contact info under        Signed, Sherren Mocha, MD  06/27/2022, 8:34 AM

## 2022-06-27 NOTE — Progress Notes (Addendum)
PROGRESS NOTE    Danielle Murray  ZOX:[id number] DOB: 07-21-1941 DOA: 06/19/2022 PCP: [name], MD  Danielle Murray is a 81 y.o. female with medical history significant of CAD s/p stent; HTN; HLD; hypothyroidism; and DM presented with fever. She was last admitted from 7/21-25 for hip fracture s/p repair.  She has been doing well with her rehab but became SOB a day or two prior to admission,  She developed fever in the last 2 days, max 102 sent to the ED - her work up significant NSTEMI, CHF and pneumonia -Work-up significant for respiratory failure, felt to be secondary to volume overload and pneumonia, cath significant for ostial proximal circumflex disease, which did require intervention at a later date with shockwave followed by stent placement.   Subjective: -Reports feeling a bit tired otherwise doing better overall, denies any chest pain, dyspnea nausea   Assessment & Plan:   Acute hypoxemic respiratory failure Healthcare associated pneumonia -Patient presenting with cough, fever to 102, mildly decreased oxygen saturation, and infiltrate in right lower lobe on chest x-ray, as well as significantly elevated procalcitonin, she has been treated for up pneumonia. -MRSA negative, vancomycin stopped, treated with cefepime and azithromycin, to finish total 7 days. -Improving, wean off O2 as tolerated, pulmonary toilet, out of bed to chair, incentive spirometry and flutter valve -Planning, SNF tomorrow if stable   Acute systolic CHF -Echo showing drop in EF 35 to 40% with wall motion abnormalities -Cardiology, diuresed with IV Lasix, she is 8.7 L negative, creatinine stable at 1.3 -Switch to oral Lasix -Continue with Coreg, Aldactone, and losartan -starting Jardiance   Non- stemi -Troponin significantly elevated, peaked at 2400, and EKG with acute changes, 2D echo shows decline in her EF 35 to 40%.,  Treated with IV heparin initially -Per cardiology, cardiac cath on 8/10 significant  for moderate three-vessel coronary artery disease, went for another cardiac cath/14, however she had had ostial proximal circumflex lesion treated with shockwave and stent. -Continue with Coreg, aspirin, Plavix Crestor and Imdur,    Acute on chronic blood loss anemia -Received 1 unit PRBC 8/9, especially in the setting of symptomatic anemia and MI, hemoglobin stable.   HTN -Continue carvedilol, amlodipine -on PRN hydralazine    HLD -Continue Crestor   DM -A1c is 7.5, suboptimal control -restart Jardiance -Switch 70/30 to sem-glee for now -Will cover with moderate-scale SSI for now   Hypothyroidism -Continue Synthroid   Recent hip fracture -Appears to be doing well from this standpoint -She is likely to need to return to rehab following this hospitalization -Back to SNF tomorrow if stable    Obesity Body mass index is 32.1 kg/m.   Pressure ulcer -Present on admission -Consulted wound care   Pressure Injury 06/19/22 Buttocks Left Stage 2 -  Partial thickness loss of dermis presenting as a shallow open injury with a red, pink wound bed without slough. (Active)  06/19/22 1202  Location: Buttocks  Location Orientation: Left  Staging: Stage 2 -  Partial thickness loss of dermis presenting as a shallow open injury with a red, pink wound bed without slough.  Wound Description (Comments):   Present on Admission: Yes            DVT prophylaxis: Heparin subcutaneous Code Status: Full Family Communication: Spouse at bedside Disposition Plan: SNF tomorrow if stable  Consultants:    Procedures:   Antimicrobials:    Objective: Vitals:   06/26/22 2312 06/27/22 0409 06/27/22 0743 06/27/22 0845  BP: (!) 115/47  128/62 (!) 129/47   Pulse: 62 61 (!) 56 (!) 56  Resp: '20 20 13 15  '$ Temp: 98.6 F (37 C) 98.3 F (36.8 C) 98.4 F (36.9 C)   TempSrc: Oral Oral Oral   SpO2: 96% 97% 95% 98%  Weight:      Height:        Intake/Output Summary (Last 24 hours) at 06/27/2022  1033 Last data filed at 06/27/2022 0300 Gross per 24 hour  Intake --  Output 1250 ml  Net -1250 ml   Filed Weights   06/19/22 0426  Weight: 84.8 kg    Examination:  General exam: Elderly chronically ill female sitting up in bed appears calm and comfortable  Respiratory system: Breath sounds the bases Cardiovascular system: S1 & S2 heard, RRR.  Abd: nondistended, soft and nontender.Normal bowel sounds heard. Central nervous system: Alert and oriented. No focal neurological deficits. Extremities: no edema Skin: No rashes Psychiatry:  Mood & affect appropriate.     Data Reviewed:   CBC: Recent Labs  Lab 06/23/22 0025 06/24/22 0017 06/25/22 0144 06/26/22 0106 06/27/22 0100  WBC 8.8 8.0 10.1 9.8 10.1  HGB 9.3* 10.1* 10.7* 10.1* 10.1*  HCT 28.4* 31.0* 32.8* 30.5* 31.7*  MCV 86.6 86.6 86.3 86.9 87.6  PLT 332 389 425* 410* 332   Basic Metabolic Panel: Recent Labs  Lab 06/23/22 0025 06/24/22 0017 06/25/22 0144 06/26/22 0106 06/27/22 0100  NA 136 137 137 137 136  K 3.9 3.7 3.8 3.7 3.9  CL 107 106 103 104 103  CO2 19* 21* '22 24 23  '$ GLUCOSE 163* 182* 194* 167* 183*  BUN 42* 41* 40* 44* 46*  CREATININE 1.22* 1.21* 1.19* 1.16* 1.30*  CALCIUM 8.2* 8.7* 8.6* 8.4* 8.6*   GFR: Estimated Creatinine Clearance: 36.3 mL/min (A) (by C-G formula based on SCr of 1.3 mg/dL (H)). Liver Function Tests: No results for input(s): "AST", "ALT", "ALKPHOS", "BILITOT", "PROT", "ALBUMIN" in the last 168 hours. No results for input(s): "LIPASE", "AMYLASE" in the last 168 hours. No results for input(s): "AMMONIA" in the last 168 hours. Coagulation Profile: No results for input(s): "INR", "PROTIME" in the last 168 hours. Cardiac Enzymes: No results for input(s): "CKTOTAL", "CKMB", "CKMBINDEX", "TROPONINI" in the last 168 hours. BNP (last 3 results) No results for input(s): "PROBNP" in the last 8760 hours. HbA1C: No results for input(s): "HGBA1C" in the last 72 hours. CBG: Recent Labs   Lab 06/25/22 2118 06/26/22 0639 06/26/22 1155 06/26/22 2110 06/27/22 0606  GLUCAP 231* 173* 197* 134* 139*   Lipid Profile: No results for input(s): "CHOL", "HDL", "LDLCALC", "TRIG", "CHOLHDL", "LDLDIRECT" in the last 72 hours. Thyroid Function Tests: No results for input(s): "TSH", "T4TOTAL", "FREET4", "T3FREE", "THYROIDAB" in the last 72 hours. Anemia Panel: No results for input(s): "VITAMINB12", "FOLATE", "FERRITIN", "TIBC", "IRON", "RETICCTPCT" in the last 72 hours. Urine analysis:    Component Value Date/Time   COLORURINE YELLOW 06/19/2022 0454   APPEARANCEUR CLOUDY (A) 06/19/2022 0454   LABSPEC 1.017 06/19/2022 0454   PHURINE 5.0 06/19/2022 0454   GLUCOSEU >=500 (A) 06/19/2022 0454   HGBUR NEGATIVE 06/19/2022 0454   HGBUR large 10/10/2010 1014   BILIRUBINUR NEGATIVE 06/19/2022 0454   KETONESUR 5 (A) 06/19/2022 0454   PROTEINUR 30 (A) 06/19/2022 0454   UROBILINOGEN 0.2 10/10/2010 1014   NITRITE NEGATIVE 06/19/2022 0454   LEUKOCYTESUR SMALL (A) 06/19/2022 0454   Sepsis Labs: '@LABRCNTIP'$ (procalcitonin:4,lacticidven:4)  ) Recent Results (from the past 240 hour(s))  Resp Panel by RT-PCR (Flu A&B, Covid) Peripheral  Status: None   Collection Time: 06/19/22  4:28 AM   Specimen: Peripheral; Nasal Swab  Result Value Ref Range Status   SARS Coronavirus 2 by RT PCR NEGATIVE NEGATIVE Final    Comment: (NOTE) SARS-CoV-2 target nucleic acids are NOT DETECTED.  The SARS-CoV-2 RNA is generally detectable in upper respiratory specimens during the acute phase of infection. The lowest concentration of SARS-CoV-2 viral copies this assay can detect is 138 copies/mL. A negative result does not preclude SARS-Cov-2 infection and should not be used as the sole basis for treatment or other patient management decisions. A negative result may occur with  improper specimen collection/handling, submission of specimen other than nasopharyngeal swab, presence of viral mutation(s) within  the areas targeted by this assay, and inadequate number of viral copies(<138 copies/mL). A negative result must be combined with clinical observations, patient history, and epidemiological information. The expected result is Negative.  Fact Sheet for Patients:  EntrepreneurPulse.com.au  Fact Sheet for Healthcare Providers:  IncredibleEmployment.be  This test is no t yet approved or cleared by the [place] FDA and  has been authorized for detection and/or diagnosis of SARS-CoV-2 by FDA under an Emergency Use Authorization (EUA). This EUA will remain  in effect (meaning this test can be used) for the duration of the COVID-19 declaration under Section 564(b)(1) of the Act, 21 U.S.C.section 360bbb-3(b)(1), unless the authorization is terminated  or revoked sooner.       Influenza A by PCR NEGATIVE NEGATIVE Final   Influenza B by PCR NEGATIVE NEGATIVE Final    Comment: (NOTE) The Xpert Xpress SARS-CoV-2/FLU/RSV plus assay is intended as an aid in the diagnosis of influenza from Nasopharyngeal swab specimens and should not be used as a sole basis for treatment. Nasal washings and aspirates are unacceptable for Xpert Xpress SARS-CoV-2/FLU/RSV testing.  Fact Sheet for Patients: EntrepreneurPulse.com.au  Fact Sheet for Healthcare Providers: IncredibleEmployment.be  This test is not yet approved or cleared by the [place] FDA and has been authorized for detection and/or diagnosis of SARS-CoV-2 by FDA under an Emergency Use Authorization (EUA). This EUA will remain in effect (meaning this test can be used) for the duration of the COVID-19 declaration under Section 564(b)(1) of the Act, 21 U.S.C. section 360bbb-3(b)(1), unless the authorization is terminated or revoked.  Performed at [facility], [place] [address], [place], [place] [address]   Blood Culture (routine x 2)     Status: None    Collection Time: 06/19/22  4:30 AM   Specimen: BLOOD RIGHT WRIST  Result Value Ref Range Status   Specimen Description BLOOD RIGHT WRIST  Final   Special Requests   Final    BOTTLES DRAWN AEROBIC AND ANAEROBIC Blood Culture adequate volume   Culture   Final    NO GROWTH 5 DAYS Performed at [facility], [place] [address] [place], [place] [address]    Report Status 06/24/2022 FINAL  Final  Blood Culture (routine x 2)     Status: None   Collection Time: 06/19/22  4:36 AM   Specimen: BLOOD LEFT HAND  Result Value Ref Range Status   Specimen Description BLOOD LEFT HAND  Final   Special Requests   Final    BOTTLES DRAWN AEROBIC AND ANAEROBIC Blood Culture results may not be optimal due to an inadequate volume of blood received in culture bottles   Culture   Final    NO GROWTH 5 DAYS Performed at [facility], [place] [address]  [place], [place] [address]    Report Status 06/24/2022 FINAL  Final  Urine Culture     Status: Abnormal   Collection Time: 06/19/22  4:54 AM   Specimen: In/Out Cath Urine  Result Value Ref Range Status   Specimen Description IN/OUT CATH URINE  Final   Special Requests   Final    NONE Performed at [facility], [place] [address] [place], [place] [address]    Culture >=100,000 COLONIES/mL ESCHERICHIA COLI (A)  Final   Report Status 06/21/2022 FINAL  Final   Organism ID, Bacteria ESCHERICHIA COLI (A)  Final      Susceptibility   Escherichia coli - MIC*    AMPICILLIN <=2 SENSITIVE Sensitive     CEFAZOLIN <=4 SENSITIVE Sensitive     CEFEPIME <=0.12 SENSITIVE Sensitive     CEFTRIAXONE <=0.25 SENSITIVE Sensitive     CIPROFLOXACIN <=0.25 SENSITIVE Sensitive     GENTAMICIN <=1 SENSITIVE Sensitive     IMIPENEM <=0.25 SENSITIVE Sensitive     NITROFURANTOIN <=16 SENSITIVE Sensitive     TRIMETH/SULFA <=20 SENSITIVE Sensitive     AMPICILLIN/SULBACTAM <=2 SENSITIVE Sensitive     PIP/TAZO <=4 SENSITIVE Sensitive     * >=100,000 COLONIES/mL ESCHERICHIA  COLI  MRSA Next Gen by PCR, Nasal     Status: None   Collection Time: 06/20/22  3:58 PM   Specimen: Nasal Mucosa; Nasal Swab  Result Value Ref Range Status   MRSA by PCR Next Gen NOT DETECTED NOT DETECTED Final    Comment: (NOTE) The GeneXpert MRSA Assay (FDA approved for NASAL specimens only), is one component of a comprehensive MRSA colonization surveillance program. It is not intended to diagnose MRSA infection nor to guide or monitor treatment for MRSA infections. Test performance is not FDA approved in patients less than 47 years old. Performed at [facility], [place] [address], [place], [place] [address]      Radiology Studies: CARDIAC CATHETERIZATION  Result Date: 06/25/2022 Conclusions: Successful shockwave assisted stent of the ostial to proximal circumflex reducing 70% stenosis to 10% with TIMI grade III flow.  4.0 x 15 mm stent postdilated to 4.5 cm in diameter with no apparent complications. RECOMMENDATIONS: Aspirin and Plavix for 6 months and then consider monotherapy with clopidogrel thereafter.     Scheduled Meds:  albuterol  2.5 mg Nebulization BID   aspirin  81 mg Oral Daily   carvedilol  6.25 mg Oral BID WC   clopidogrel  75 mg Oral Q breakfast   empagliflozin  10 mg Oral Daily   furosemide  40 mg Oral Daily   heparin  5,000 Units Subcutaneous Q8H   insulin aspart  0-15 Units Subcutaneous TID WC   insulin aspart  0-5 Units Subcutaneous QHS   insulin glargine-yfgn  15 Units Subcutaneous BID   isosorbide mononitrate  30 mg Oral Daily   leptospermum manuka honey  1 Application Topical Daily   levothyroxine  88 mcg Oral QHS   losartan  12.5 mg Oral Daily   rosuvastatin  20 mg Oral Daily   senna-docusate  1 tablet Oral BID   sodium chloride flush  3 mL Intravenous Q12H   sodium chloride flush  3 mL Intravenous Q12H   sodium chloride flush  3 mL Intravenous Q12H   sodium chloride flush  3 mL Intravenous Q12H   spironolactone  12.5 mg Oral Daily    Continuous Infusions:  sodium chloride     sodium chloride     promethazine (PHENERGAN)  injection (IM or IVPB)       LOS: 8 days    Time spent: 88mn    PDomenic Polite MD [facility]   06/27/2022, 10:33 AM

## 2022-06-27 NOTE — Progress Notes (Signed)
Physical Therapy Treatment Patient Details Name: Danielle Murray MRN: [id number] DOB: February 01, 1941 Today's Date: 06/27/2022   History of Present Illness 81 y.o. female presents to [facility] hospital on 06/19/2022 with fever, SOB, and elevated troponins. Pt admitted fro management of NSTEMI and sepsis 2/2 PNA. S/P cath 8/14. Pt recently admitted 7/21-25 for hip fracture repair. PMH includes OA, CAD, depression, HTN, HLD, DMII.    PT Comments    Pt received supine and agreeable to session with continued progress towards acute goals. Pt requiring up to mod assist to elevate trunk to come to sitting EOB and scoot for B feet on floor. Pt able to come to standing and step pivot transfer to recliner with min assist for cues and sequencing. Pt continues to be limited by LLE weakness, decreased balance strategies, and general fatigue. Pt agreeable to time up in chair at end of session and verbalizing understanding of benefits to upright sitting in recliner. Pt continues to benefit from skilled PT services to progress toward functional mobility goals.   SpO2 >95% during activity on RA   Recommendations for follow up therapy are one component of a multi-disciplinary discharge planning process, led by the attending physician.  Recommendations may be updated based on patient status, additional functional criteria and insurance authorization.  Follow Up Recommendations  Skilled nursing-short term rehab (<3 hours/day) Can patient physically be transported by private vehicle: No   Assistance Recommended at Discharge Intermittent Supervision/Assistance  Patient can return home with the following A lot of help with walking and/or transfers;A lot of help with bathing/dressing/bathroom;Assistance with cooking/housework;Assist for transportation;Help with stairs or ramp for entrance   Equipment Recommendations  Wheelchair (measurements PT);BSC/3in1    Recommendations for Other Services       Precautions / Restrictions  Precautions Precautions: Fall Restrictions Weight Bearing Restrictions: Yes LLE Weight Bearing: Weight bearing as tolerated     Mobility  Bed Mobility Overal bed mobility: Needs Assistance Bed Mobility: Supine to Sit, Sit to Sidelying, Rolling Rolling: Min assist   Supine to sit: Mod assist     General bed mobility comments: L LE and trunk support to progress to EOB, increased time and cueing for sequencing    Transfers Overall transfer level: Needs assistance Equipment used: Rolling walker (2 wheels) Transfers: Sit to/from Stand, Bed to chair/wheelchair/BSC Sit to Stand: Min assist   Step pivot transfers: Min assist       General transfer comment: min assist to come to standing from EOB and step pivot toward R to recliner, assist for sequencing of RW and LEs    Ambulation/Gait                   Stairs             Wheelchair Mobility    Modified Rankin (Stroke Patients Only)       Balance Overall balance assessment: Needs assistance Sitting-balance support: No upper extremity supported, Feet supported Sitting balance-Leahy Scale: Fair Sitting balance - Comments: min guard for safety   Standing balance support: Single extremity supported Standing balance-Leahy Scale: Poor                              Cognition Arousal/Alertness: Awake/alert Behavior During Therapy: Anxious Overall Cognitive Status: Within Functional Limits for tasks assessed  General Comments: able to follow commands and engage appropriately        Exercises General Exercises - Lower Extremity Long Arc Quad: AROM, Left, 10 reps, Seated Heel Slides: AROM, Left, 10 reps, Seated Hip ABduction/ADduction: AROM, Left, 10 reps, Seated Hip Flexion/Marching: AROM, Right, Seated, 10 reps Heel Raises: AROM, Left, 10 reps, Seated    General Comments General comments (skin integrity, edema, etc.): VSS on RA       Pertinent Vitals/Pain Pain Assessment Pain Assessment: Faces Faces Pain Scale: Hurts a little bit Pain Location: R arm, LLE Pain Descriptors / Indicators: Guarding, Grimacing, Sore Pain Intervention(s): Monitored during session, Limited activity within patient's tolerance    Home Living                          Prior Function            PT Goals (current goals can now be found in the care plan section) Acute Rehab PT Goals Patient Stated Goal: to return to SNF and continue progression back to independence PT Goal Formulation: With patient Time For Goal Achievement: 07/08/22    Frequency    Min 3X/week      PT Plan Current plan remains appropriate    Co-evaluation              AM-PAC PT "6 Clicks" Mobility   Outcome Measure  Help needed turning from your back to your side while in a flat bed without using bedrails?: A Little Help needed moving from lying on your back to sitting on the side of a flat bed without using bedrails?: A Lot Help needed moving to and from a bed to a chair (including a wheelchair)?: A Lot Help needed standing up from a chair using your arms (e.g., wheelchair or bedside chair)?: A Lot Help needed to walk in hospital room?: Total Help needed climbing 3-5 steps with a railing? : Total 6 Click Score: 11    End of Session   Activity Tolerance: Patient tolerated treatment well Patient left: with call bell/phone within reach;with family/visitor present;in chair Nurse Communication: Mobility status PT Visit Diagnosis: Unsteadiness on feet (R26.81);Pain;Difficulty in walking, not elsewhere classified (R26.2) Pain - Right/Left: Left Pain - part of body: Hip;Leg     Time: 6834-1962 PT Time Calculation (min) (ACUTE ONLY): 20 min  Charges:  $Therapeutic Activity: 8-22 mins                     Chene Kasinger R. PTA Acute Rehabilitation Services Office: [place] 06/27/2022, 3:30 PM

## 2022-06-28 DIAGNOSIS — I739 Peripheral vascular disease, unspecified: Secondary | ICD-10-CM | POA: Diagnosis not present

## 2022-06-28 DIAGNOSIS — Z7401 Bed confinement status: Secondary | ICD-10-CM | POA: Diagnosis not present

## 2022-06-28 DIAGNOSIS — Z7389 Other problems related to life management difficulty: Secondary | ICD-10-CM | POA: Diagnosis not present

## 2022-06-28 DIAGNOSIS — E559 Vitamin D deficiency, unspecified: Secondary | ICD-10-CM | POA: Diagnosis not present

## 2022-06-28 DIAGNOSIS — J189 Pneumonia, unspecified organism: Secondary | ICD-10-CM | POA: Diagnosis not present

## 2022-06-28 DIAGNOSIS — E1165 Type 2 diabetes mellitus with hyperglycemia: Secondary | ICD-10-CM | POA: Diagnosis not present

## 2022-06-28 DIAGNOSIS — K5900 Constipation, unspecified: Secondary | ICD-10-CM | POA: Diagnosis not present

## 2022-06-28 DIAGNOSIS — E1122 Type 2 diabetes mellitus with diabetic chronic kidney disease: Secondary | ICD-10-CM | POA: Diagnosis not present

## 2022-06-28 DIAGNOSIS — I5021 Acute systolic (congestive) heart failure: Secondary | ICD-10-CM | POA: Diagnosis not present

## 2022-06-28 DIAGNOSIS — M6281 Muscle weakness (generalized): Secondary | ICD-10-CM | POA: Diagnosis not present

## 2022-06-28 DIAGNOSIS — D62 Acute posthemorrhagic anemia: Secondary | ICD-10-CM | POA: Diagnosis not present

## 2022-06-28 DIAGNOSIS — E113293 Type 2 diabetes mellitus with mild nonproliferative diabetic retinopathy without macular edema, bilateral: Secondary | ICD-10-CM | POA: Diagnosis not present

## 2022-06-28 DIAGNOSIS — J9601 Acute respiratory failure with hypoxia: Secondary | ICD-10-CM | POA: Diagnosis not present

## 2022-06-28 DIAGNOSIS — R2689 Other abnormalities of gait and mobility: Secondary | ICD-10-CM | POA: Diagnosis not present

## 2022-06-28 DIAGNOSIS — I10 Essential (primary) hypertension: Secondary | ICD-10-CM | POA: Diagnosis not present

## 2022-06-28 DIAGNOSIS — R41841 Cognitive communication deficit: Secondary | ICD-10-CM | POA: Diagnosis not present

## 2022-06-28 DIAGNOSIS — F424 Excoriation (skin-picking) disorder: Secondary | ICD-10-CM | POA: Diagnosis not present

## 2022-06-28 DIAGNOSIS — I130 Hypertensive heart and chronic kidney disease with heart failure and stage 1 through stage 4 chronic kidney disease, or unspecified chronic kidney disease: Secondary | ICD-10-CM | POA: Diagnosis not present

## 2022-06-28 DIAGNOSIS — R262 Difficulty in walking, not elsewhere classified: Secondary | ICD-10-CM | POA: Diagnosis not present

## 2022-06-28 DIAGNOSIS — S72142D Displaced intertrochanteric fracture of left femur, subsequent encounter for closed fracture with routine healing: Secondary | ICD-10-CM | POA: Diagnosis not present

## 2022-06-28 DIAGNOSIS — R0609 Other forms of dyspnea: Secondary | ICD-10-CM | POA: Diagnosis not present

## 2022-06-28 DIAGNOSIS — L8946 Pressure-induced deep tissue damage of contiguous site of back, buttock and hip: Secondary | ICD-10-CM | POA: Diagnosis not present

## 2022-06-28 DIAGNOSIS — I214 Non-ST elevation (NSTEMI) myocardial infarction: Secondary | ICD-10-CM | POA: Diagnosis not present

## 2022-06-28 DIAGNOSIS — N1831 Chronic kidney disease, stage 3a: Secondary | ICD-10-CM | POA: Diagnosis not present

## 2022-06-28 DIAGNOSIS — I2510 Atherosclerotic heart disease of native coronary artery without angina pectoris: Secondary | ICD-10-CM | POA: Diagnosis not present

## 2022-06-28 DIAGNOSIS — E785 Hyperlipidemia, unspecified: Secondary | ICD-10-CM | POA: Diagnosis not present

## 2022-06-28 DIAGNOSIS — R531 Weakness: Secondary | ICD-10-CM | POA: Diagnosis not present

## 2022-06-28 DIAGNOSIS — I509 Heart failure, unspecified: Secondary | ICD-10-CM | POA: Diagnosis not present

## 2022-06-28 DIAGNOSIS — I5043 Acute on chronic combined systolic (congestive) and diastolic (congestive) heart failure: Secondary | ICD-10-CM | POA: Diagnosis not present

## 2022-06-28 DIAGNOSIS — E782 Mixed hyperlipidemia: Secondary | ICD-10-CM | POA: Diagnosis not present

## 2022-06-28 DIAGNOSIS — E039 Hypothyroidism, unspecified: Secondary | ICD-10-CM | POA: Diagnosis not present

## 2022-06-28 LAB — BASIC METABOLIC PANEL
Anion gap: 9 (ref 5–15)
BUN: 41 mg/dL — ABNORMAL HIGH (ref 8–23)
CO2: 23 mmol/L (ref 22–32)
Calcium: 8.5 mg/dL — ABNORMAL LOW (ref 8.9–10.3)
Chloride: 105 mmol/L (ref 98–111)
Creatinine, Ser: 1.15 mg/dL — ABNORMAL HIGH (ref 0.44–1.00)
GFR, Estimated: 48 mL/min — ABNORMAL LOW (ref >60)
Glucose, Bld: 161 mg/dL — ABNORMAL HIGH (ref 70–99)
Potassium: 3.6 mmol/L (ref 3.5–5.1)
Sodium: 137 mmol/L (ref 135–145)

## 2022-06-28 LAB — CBC
HCT: 30.3 % — ABNORMAL LOW (ref 36.0–46.0)
Hemoglobin: 9.9 g/dL — ABNORMAL LOW (ref 12.0–15.0)
MCH: 28.2 pg (ref 26.0–34.0)
MCHC: 32.7 g/dL (ref 30.0–36.0)
MCV: 86.3 fL (ref 80.0–100.0)
Platelets: 412 10*3/uL — ABNORMAL HIGH (ref 150–400)
RBC: 3.51 MIL/uL — ABNORMAL LOW (ref 3.87–5.11)
RDW: 14.6 % (ref 11.5–15.5)
WBC: 12.5 10*3/uL — ABNORMAL HIGH (ref 4.0–10.5)
nRBC: 0 % (ref 0.0–0.2)

## 2022-06-28 LAB — GLUCOSE, CAPILLARY
Glucose-Capillary: 136 mg/dL — ABNORMAL HIGH (ref 70–99)
Glucose-Capillary: 155 mg/dL — ABNORMAL HIGH (ref 70–99)
Glucose-Capillary: 190 mg/dL — ABNORMAL HIGH (ref 70–99)

## 2022-06-28 MED ORDER — SPIRONOLACTONE 25 MG PO TABS: 12.5000 mg | ORAL_TABLET | Freq: Every day | ORAL | 0 refills | Status: DC

## 2022-06-28 MED ORDER — LOSARTAN POTASSIUM 25 MG PO TABS: 12.5000 mg | ORAL_TABLET | Freq: Every day | ORAL | 0 refills | Status: DC

## 2022-06-28 MED ORDER — NOVOLIN 70/30 FLEXPEN (70-30) 100 UNIT/ML ~~LOC~~ SUPN: 20.0000 [IU] | PEN_INJECTOR | Freq: Two times a day (BID) | SUBCUTANEOUS | Status: AC

## 2022-06-28 MED ORDER — ASPIRIN 81 MG PO CHEW: 81.0000 mg | CHEWABLE_TABLET | Freq: Every day | ORAL | Status: DC

## 2022-06-28 MED ORDER — POLYSACCHARIDE IRON COMPLEX 150 MG PO CAPS: 150.0000 mg | ORAL_CAPSULE | Freq: Every day | ORAL | Status: DC

## 2022-06-28 MED ORDER — EMPAGLIFLOZIN 10 MG PO TABS: 10.0000 mg | ORAL_TABLET | Freq: Every day | ORAL | Status: DC

## 2022-06-28 MED ORDER — FUROSEMIDE 40 MG PO TABS: 40.0000 mg | ORAL_TABLET | Freq: Every day | ORAL | Status: DC

## 2022-06-28 MED ORDER — ACETAMINOPHEN 325 MG PO TABS: 650.0000 mg | ORAL_TABLET | ORAL | Status: DC | PRN

## 2022-06-28 MED ORDER — SENNOSIDES-DOCUSATE SODIUM 8.6-50 MG PO TABS: 1.0000 | ORAL_TABLET | Freq: Two times a day (BID) | ORAL | Status: DC

## 2022-06-28 NOTE — TOC Progression Note (Addendum)
Transition of Care [facility]) - Progression Note    Patient Details  Name: Danielle Murray MRN: [id number] Date of Birth: 04-17-1941  [facility] CM/SW Contact  Reece Agar, [place] Phone Number: 06/28/2022, 11:52 AM  Clinical Narrative:    Patient will DC to: [facility] Anticipated DC date: 06/28/2022 Family notified: Pt Spouse Transport by: Corey Harold   Per MD patient ready for DC to [facility] room 306. RN to call report prior to discharge [phone]). RN, patient, patient's family, and facility notified of DC. Discharge Summary and FL2 sent to facility. DC packet on chart. Ambulance transport requested for patient.   CSW will sign off for now as social work intervention is no longer needed. Please consult [place] again if new needs arise.     Expected Discharge Plan: [facility] Barriers to Discharge: Continued Medical Work up  Expected Discharge Plan and Services Expected Discharge Plan: [place] In-house Referral: Clinical Social Work   Post Acute Care Choice: [facility] Living arrangements for the past 2 months: [place] Expected Discharge Date: 06/28/22                                     Social Determinants of Health (SDOH) Interventions    Readmission Risk Interventions    06/28/2022   10:03 AM  Readmission Risk Prevention Plan  Transportation Screening Complete  PCP or Specialist Appt within 3-5 Days Complete  HRI or [place] Complete  Social Work Consult for [place] Planning/Counseling Complete  Palliative Care Screening Not Applicable  Medication Review Press photographer) Referral to Pharmacy

## 2022-06-28 NOTE — Discharge Summary (Signed)
Physician Discharge Summary  Danielle Murray EPP:[id number] DOB: [birth year] DOA: 06/19/2022  PCP: [name], MD  Admit date: 06/19/2022 Discharge date: 06/28/2022  Time spent: 35  minutes  Recommendations for Outpatient Follow-up:  PCP in 1 week Cardiology Dr.Nishan in 1 month Palliative care follow-up  SNF for rehab, continue Lovenox for 1 more week   Discharge Diagnoses:  Principal Problem:   NSTEMI (non-ST elevated myocardial infarction) ([place]) Acute hypoxic respiratory failure Healthcare associated pneumonia Acute blood loss anemia   Hypothyroidism   Essential hypertension   Uncontrolled type 2 diabetes mellitus with hyperglycemia, with long-term current use of insulin (HCC)   CAD (coronary artery disease), native coronary artery   Dyslipidemia   Status post surgery   Pressure injury of skin   Acute on chronic combined systolic and diastolic HF (heart failure) ([place])   Discharge Condition: Stable  Diet recommendation: low sodium, heart healthy, diabetic  Filed Weights   06/19/22 0426  Weight: 84.8 kg    History of present illness:  81 y.o. female with medical history significant of CAD s/p stent; HTN; HLD; hypothyroidism; and DM presented with fever. She was last admitted from 7/21-25 for hip fracture s/p repair.  She has been doing well with her rehab but became SOB a day or two prior to admission,  She developed fever in the last 2 days, max 102 sent to the ED - her work up significant NSTEMI, CHF and pneumonia -Work-up significant for respiratory failure, felt to be secondary to volume overload and pneumonia, cath significant for ostial proximal circumflex disease, which did require intervention at a later date with shockwave followed by stent placement.  Hospital Course:  Acute hypoxemic respiratory failure Healthcare associated pneumonia -Patient presenting with cough, fever to 102, mildly decreased oxygen saturation, and infiltrate in right lower lobe on chest  x-ray, as well as significantly elevated procalcitonin, she has been treated for up pneumonia. -MRSA negative, vancomycin stopped, treated with cefepime and azithromycin, to finish total 7 days. -Improving, wean off O2 as tolerated, pulmonary toilet, out of bed to chair, incentive spirometry and flutter valve -Planning, SNF tomorrow if stable   Acute systolic CHF -Echo showing drop in EF 35 to 40% with wall motion abnormalities -Cardiology, diuresed with IV Lasix, she is 8.7 L negative, creatinine stable at 1.3 -Switch to oral Lasix -Continue with Coreg, Aldactone, and losartan -Also started Jardiance   Non- stemi -Troponin significantly elevated, peaked at 2400, and EKG with acute changes, 2D echo shows decline in her EF 35 to 40%.,  Treated with IV heparin initially -Per cardiology, cardiac cath on 8/10 significant for moderate three-vessel coronary artery disease, went for another cardiac cath/14, however she had had ostial proximal circumflex lesion treated with shockwave and stent. -Continue with Coreg, aspirin, Plavix Crestor and Imdur,    Acute on chronic blood loss anemia -Received 1 unit PRBC 8/9, especially in the setting of symptomatic anemia and MI, hemoglobin stable.   HTN -Continue carvedilol, amlodipine -on PRN hydralazine    HLD -Continue Crestor   DM -A1c is 7.5, suboptimal control -restart Jardiance -Switch 70/30 to sem-glee for now -Will cover with moderate-scale SSI for now   Hypothyroidism -Continue Synthroid   Recent hip fracture -Appears to be doing well from this standpoint -She is likely to need to return to rehab following this hospitalization -Back to SNF today, continue Lovenox for 1 more week to complete 4-week course for DVT prophylaxis   Obesity Body mass index is 32.1 kg/m.  Pressure ulcer -Present on admission -Consulted wound care   Pressure Injury 06/19/22 Buttocks Left Stage 2 -  Partial thickness loss of dermis presenting as a  shallow open injury with a red, pink wound bed without slough. (Active)  06/19/22 1202  Location: Buttocks  Location Orientation: Left  Staging: Stage 2 -  Partial thickness loss of dermis presenting as a shallow open injury with a red, pink wound bed without slough.  Wound Description (Comments):   Present on Admission: Yes      Procedures: Left heart cath Dr. Tamala Julian 8/14 Conclusions: Successful shockwave assisted stent of the ostial to proximal circumflex reducing 70% stenosis to 10% with TIMI grade III flow.  4.0 x 15 mm stent postdilated to 4.5 cm in diameter with no apparent complications.     RECOMMENDATIONS:   Aspirin and Plavix for 6 months and then consider monotherapy with clopidogrel thereafter.     Consultations: Cardiology  Discharge Exam: Vitals:   06/28/22 0323 06/28/22 0802  BP: (!) 139/56 (!) 134/55  Pulse: 60 60  Resp: 14 16  Temp: 98.3 F (36.8 C) 98.3 F (36.8 C)  SpO2: 96% 100%   General exam: Elderly chronically ill female sitting up in bed appears calm and comfortable  Respiratory system: Breath sounds the bases Cardiovascular system: S1 & S2 heard, RRR.  Abd: nondistended, soft and nontender.Normal bowel sounds heard. Central nervous system: Alert and oriented. No focal neurological deficits. Extremities: no edema Skin: No rashes Psychiatry:  Mood & affect appropriate.   Discharge Instructions   Discharge Instructions     Amb Referral to Cardiac Rehabilitation   Complete by: As directed    Diagnosis:  Coronary Stents NSTEMI PTCA     After initial evaluation and assessments completed: Virtual Based Care may be provided alone or in conjunction with Phase 2 Cardiac Rehab based on patient barriers.: Yes   Diet - low sodium heart healthy   Complete by: As directed    Diet Carb Modified   Complete by: As directed    Discharge wound care:   Complete by: As directed    Cleanse the wound on the sacral area with normal saline, pat dry then  apply a thick layer of Medihoney directly to the wound then cover with gauze and a foam dressing   Increase activity slowly   Complete by: As directed       Allergies as of 06/28/2022       Reactions   Penicillins    Diffuse joint pain @ age 39 in context of Scarlet Fever Did it involve swelling of the face/tongue/throat, SOB, or low BP? No Did it involve sudden or severe rash/hives, skin peeling, or any reaction on the inside of your mouth or nose? No Did you need to seek medical attention at a hospital or doctor's office? No When did it last happen?  childhood     If all above answers are "NO", may proceed with cephalosporin use.          Medication List     STOP taking these medications    amLODipine 5 MG tablet Commonly known as: NORVASC   oxyCODONE 5 MG immediate release tablet Commonly known as: Oxy IR/ROXICODONE       TAKE these medications    acetaminophen 325 MG tablet Commonly known as: TYLENOL Take 2 tablets (650 mg total) by mouth every 4 (four) hours as needed for headache or mild pain.   aspirin 81 MG chewable tablet Chew 1 tablet (  81 mg total) by mouth daily.   carvedilol 6.25 MG tablet Commonly known as: COREG TAKE 1 TABLET BY MOUTH TWICE DAILY WITH A MEAL   clopidogrel 75 MG tablet Commonly known as: PLAVIX Take 1 tablet by mouth once daily   empagliflozin 10 MG Tabs tablet Commonly known as: JARDIANCE Take 1 tablet (10 mg total) by mouth daily. What changed:  medication strength how much to take   enoxaparin 40 MG/0.4ML injection Commonly known as: LOVENOX Inject 0.4 mLs (40 mg total) into the skin daily for 23 days.   furosemide 40 MG tablet Commonly known as: LASIX Take 1 tablet (40 mg total) by mouth daily.   iron polysaccharides 150 MG capsule Commonly known as: NIFEREX Take 1 capsule (150 mg total) by mouth daily.   isosorbide mononitrate 30 MG 24 hr tablet Commonly known as: IMDUR Take 1 tablet by mouth once daily    levothyroxine 88 MCG tablet Commonly known as: SYNTHROID Take 88 mcg by mouth at bedtime.   losartan 25 MG tablet Commonly known as: COZAAR Take 0.5 tablets (12.5 mg total) by mouth daily.   nitroGLYCERIN 0.4 MG SL tablet Commonly known as: NITROSTAT Place 1 tablet (0.4 mg total) under the tongue every 5 (five) minutes as needed for chest pain (3 doses max).   NovoLIN 70/30 Kwikpen (70-30) 100 UNIT/ML KwikPen Generic drug: insulin isophane & regular human KwikPen Inject 20 Units into the skin in the morning and at bedtime. Take 30 units in the morning and Take 50 units at bedtime What changed: how much to take   PROBIOTIC PO Take 1 tablet by mouth daily.   rosuvastatin 5 MG tablet Commonly known as: CRESTOR Take 1 tablet (5 mg total) by mouth daily. What changed: how much to take   senna-docusate 8.6-50 MG tablet Commonly known as: Senokot-S Take 1 tablet by mouth 2 (two) times daily.   spironolactone 25 MG tablet Commonly known as: ALDACTONE Take 0.5 tablets (12.5 mg total) by mouth daily.   VITAMIN D3 PO Take 1 tablet by mouth daily.               Discharge Care Instructions  (From admission, onward)           Start     Ordered   06/28/22 0000  Discharge wound care:       Comments: Cleanse the wound on the sacral area with normal saline, pat dry then apply a thick layer of Medihoney directly to the wound then cover with gauze and a foam dressing   06/28/22 0934           Allergies  Allergen Reactions   Penicillins     Diffuse joint pain @ age 68 in context of Scarlet Fever  Did it involve swelling of the face/tongue/throat, SOB, or low BP? No Did it involve sudden or severe rash/hives, skin peeling, or any reaction on the inside of your mouth or nose? No Did you need to seek medical attention at a hospital or doctor's office? No When did it last happen?  childhood     If all above answers are "NO", may proceed with cephalosporin use.       Follow-up Information     Elgie Collard, PA-C Follow up on 07/10/2022.   Specialty: Cardiothoracic Surgery Why: at 10am for your follow up appt with Dr. Mariana Arn' PA Contact information: [address] [place] [address] [phone]  The results of significant diagnostics from this hospitalization (including imaging, microbiology, ancillary and laboratory) are listed below for reference.    Significant Diagnostic Studies: CARDIAC CATHETERIZATION  Result Date: 06/25/2022 Conclusions: Successful shockwave assisted stent of the ostial to proximal circumflex reducing 70% stenosis to 10% with TIMI grade III flow.  4.0 x 15 mm stent postdilated to 4.5 cm in diameter with no apparent complications. RECOMMENDATIONS: Aspirin and Plavix for 6 months and then consider monotherapy with clopidogrel thereafter.   DG CHEST PORT 1 VIEW  Result Date: 06/23/2022 CLINICAL DATA:  Abnormal respirations EXAM: PORTABLE CHEST 1 VIEW COMPARISON:  June 20, 2022 FINDINGS: No pneumothorax. Cardiomegaly. Mild increased interstitial prominence. More focal opacity in the right base is improved in the interval. No other interval changes. IMPRESSION: 1. Findings suggest cardiomegaly with mild edema. 2. More focal opacity in the right base is improved in the interval and may represent resolving infiltrate. Recommend continued attention on follow-up. Electronically Signed   By: Dorise Bullion III M.D.   On: 06/23/2022 08:41   CARDIAC CATHETERIZATION  Result Date: 06/21/2022 CONCLUSIONS: Moderate three-vessel coronary disease involving the distal nondominant right coronary, the large first diagonal jailed by previous LAD stent, obtuse marginal and PDA disease, and ostial to proximal circumflex. Moderate pulmonary hypertension with mean PA pressure 30 mmHg, likely WHO group 2 with capillary wedge pressure of 26, LVEDP 31 mm, pulmonary vascular resistance 2.3 Wood units. RECOMMENDATIONS:  Continue therapy of heart failure.   DG Chest Port 1 View  Result Date: 06/20/2022 CLINICAL DATA:  Dyspnea, fever EXAM: PORTABLE CHEST 1 VIEW COMPARISON:  06/19/2022 FINDINGS: Stable cardiomediastinal contours. Progressive patchy airspace opacity within the right lower lobe. Mild streaky left basilar opacity. No pleural effusion or pneumothorax. IMPRESSION: Progressive patchy airspace opacity within the right lower lobe, concerning for pneumonia. Mild streaky left basilar opacity could reflect atelectasis or additional focus of infection. Electronically Signed   By: Davina Poke D.O.   On: 06/20/2022 16:35   ECHOCARDIOGRAM COMPLETE  Result Date: 06/19/2022    ECHOCARDIOGRAM REPORT   Patient Name:   KHALIYAH NORTHROP Abernathy Date of Exam: 06/19/2022 Medical Rec #:  [id number]       Height:       64.0 in Accession #:    [id number]      Weight:       187.0 lb Date of Birth:  [birth year]        BSA:          1.901 m Patient Age:    60 years        BP:           106/44 mmHg Patient Gender: F               HR:           69 bpm. Exam Location:  Inpatient Procedure: 2D Echo, Cardiac Doppler, Color Doppler and Intracardiac            Opacification Agent Indications:    NSTEMI  History:        Patient has prior history of Echocardiogram examinations, most                 recent 11/09/2020. CAD, PAD, Arrythmias:Tachycardia; Risk                 Factors:Hypertension and Diabetes.  Sonographer:    Wenda Low Referring Phys: [id number] RHONDA G BARRETT IMPRESSIONS  1. No left ventricular thrombus is seen (Definity contrast was used).  Wall motion suggests infarction due to occlusion of the mid-LAD artery or a large ramus intermedius vessel that reaches the lateral apex. Left ventricular ejection fraction, by estimation, is 35 to 40%. The left ventricle has moderately decreased function. The left ventricle demonstrates regional wall motion abnormalities (see scoring diagram/findings for description). There is mild concentric left  ventricular hypertrophy. Left  ventricular diastolic parameters are consistent with Grade II diastolic dysfunction (pseudonormalization). Elevated left atrial pressure.  2. Right ventricular systolic function is normal. The right ventricular size is normal. There is normal pulmonary artery systolic pressure. The estimated right ventricular systolic pressure is 95.2 mmHg.  3. Left atrial size was severely dilated.  4. The mitral valve is degenerative. Mild to moderate mitral valve regurgitation. No evidence of mitral stenosis. The mean mitral valve gradient is 3.1 mmHg. Moderate mitral annular calcification.  5. Tricuspid valve regurgitation is mild to moderate.  6. The aortic valve is tricuspid. Aortic valve regurgitation is not visualized. No aortic stenosis is present. Comparison(s): Prior images unable to be directly viewed, comparison made by report only. The left ventricular function is significantly worse. The left ventricular wall motion abnormalities are new. FINDINGS  Left Ventricle: No left ventricular thrombus is seen (Definity contrast was used). Wall motion suggests infarction due to occlusion of the mid-LAD artery or a large ramus intermedius vessel that reaches the lateral apex. Left ventricular ejection fraction, by estimation, is 35 to 40%. The left ventricle has moderately decreased function. The left ventricle demonstrates regional wall motion abnormalities. Definity contrast agent was given IV to delineate the left ventricular endocardial borders. The left ventricular internal cavity size was normal in size. There is mild concentric left ventricular hypertrophy. Left ventricular diastolic parameters are consistent with Grade II diastolic dysfunction (pseudonormalization). Elevated left atrial pressure.  LV Wall Scoring: The apical lateral segment, mid anterolateral segment, apical anterior segment, and apex are akinetic. The anterior wall, entire septum, entire inferior wall, posterior wall, and  basal anterolateral segment are normal. Right Ventricle: The right ventricular size is normal. No increase in right ventricular wall thickness. Right ventricular systolic function is normal. There is normal pulmonary artery systolic pressure. The tricuspid regurgitant velocity is 2.81 m/s, and  with an assumed right atrial pressure of 3 mmHg, the estimated right ventricular systolic pressure is 84.1 mmHg. Left Atrium: Left atrial size was severely dilated. Right Atrium: Right atrial size was normal in size. Pericardium: There is no evidence of pericardial effusion. Mitral Valve: The mitral valve is degenerative in appearance. Moderate mitral annular calcification. Mild to moderate mitral valve regurgitation, with centrally-directed jet. No evidence of mitral valve stenosis. MV peak gradient, 10.0 mmHg. The mean mitral valve gradient is 3.1 mmHg. Tricuspid Valve: The tricuspid valve is normal in structure. Tricuspid valve regurgitation is mild to moderate. Aortic Valve: The aortic valve is tricuspid. Aortic valve regurgitation is not visualized. No aortic stenosis is present. Aortic valve mean gradient measures 4.0 mmHg. Aortic valve peak gradient measures 9.0 mmHg. Aortic valve area, by VTI measures 1.90 cm. Pulmonic Valve: The pulmonic valve was normal in structure. Pulmonic valve regurgitation is not visualized. Aorta: The aortic root and ascending aorta are structurally normal, with no evidence of dilitation. IAS/Shunts: No atrial level shunt detected by color flow Doppler.  LEFT VENTRICLE PLAX 2D LVIDd:         5.40 cm     Diastology LVIDs:         3.30 cm     LV e' medial:  5.33 cm/s LV PW:         1.20 cm     LV E/e' medial:  26.6 LV IVS:        0.85 cm     LV e' lateral:   8.16 cm/s LVOT diam:     1.80 cm     LV E/e' lateral: 17.4 LV SV:         68 LV SV Index:   36 LVOT Area:     2.54 cm  LV Volumes (MOD) LV vol d, MOD A2C: 64.4 ml LV vol d, MOD A4C: 74.6 ml LV vol s, MOD A2C: 31.6 ml LV vol s, MOD A4C:  36.4 ml LV SV MOD A2C:     32.8 ml LV SV MOD A4C:     74.6 ml LV SV MOD BP:      35.9 ml RIGHT VENTRICLE RV Basal diam:  2.80 cm RV Mid diam:    2.50 cm RV S prime:     10.60 cm/s TAPSE (M-mode): 3.0 cm LEFT ATRIUM             Index        RIGHT ATRIUM           Index LA diam:        5.00 cm 2.63 cm/m   RA Area:     14.20 cm LA Vol (A2C):   66.8 ml 35.13 ml/m  RA Volume:   34.90 ml  18.36 ml/m LA Vol (A4C):   67.3 ml 35.40 ml/m LA Biplane Vol: 67.8 ml 35.66 ml/m  AORTIC VALVE                    PULMONIC VALVE AV Area (Vmax):    1.67 cm     PV Vmax:       0.85 m/s AV Area (Vmean):   1.87 cm     PV Peak grad:  2.9 mmHg AV Area (VTI):     1.90 cm AV Vmax:           150.00 cm/s AV Vmean:          93.200 cm/s AV VTI:            0.357 m AV Peak Grad:      9.0 mmHg AV Mean Grad:      4.0 mmHg LVOT Vmax:         98.30 cm/s LVOT Vmean:        68.400 cm/s LVOT VTI:          0.266 m LVOT/AV VTI ratio: 0.75  AORTA Ao Root diam: 2.80 cm MITRAL VALVE                TRICUSPID VALVE MV Area (PHT): 3.06 cm     TR Peak grad:   31.6 mmHg MV Area VTI:   1.65 cm     TR Vmax:        281.00 cm/s MV Peak grad:  10.0 mmHg MV Mean grad:  3.1 mmHg     SHUNTS MV Vmax:       1.58 m/s     Systemic VTI:  0.27 m MV Vmean:      72.3 cm/s    Systemic Diam: 1.80 cm MV Decel Time: 248 msec MV E velocity: 142.00 cm/s MV A velocity: 74.20 cm/s MV E/A ratio:  1.91 Mihai Croitoru MD Electronically signed by Sanda Klein MD Signature Date/Time: 06/19/2022/11:24:42 AM    Final  CT Angio Chest PE W/Cm &/Or Wo Cm  Result Date: 06/19/2022 CLINICAL DATA:  Fever with shortness of breath and wheezing. Recent hip surgery. EXAM: CT ANGIOGRAPHY CHEST WITH CONTRAST TECHNIQUE: Multidetector CT imaging of the chest was performed using the standard protocol during bolus administration of intravenous contrast. Multiplanar CT image reconstructions and MIPs were obtained to evaluate the vascular anatomy. RADIATION DOSE REDUCTION: This exam was performed  according to the departmental dose-optimization program which includes automated exposure control, adjustment of the mA and/or kV according to patient size and/or use of iterative reconstruction technique. CONTRAST:  60m OMNIPAQUE IOHEXOL 350 MG/ML SOLN COMPARISON:  None Available. FINDINGS: Cardiovascular: Heart is enlarged. No substantial pericardial effusion. Coronary artery calcification is evident. Mild atherosclerotic calcification is noted in the wall of the thoracic aorta. There is no filling defect within the opacified pulmonary arteries to suggest the presence of an acute pulmonary embolus. Mediastinum/Nodes: No mediastinal lymphadenopathy. There is no hilar lymphadenopathy. The esophagus has normal imaging features. Tiny hiatal hernia noted. There is no axillary lymphadenopathy. Lungs/Pleura: Focal airspace disease identified right lower lobe on image 75/6 with a somewhat nodular ill-defined appearance. Subsegmental atelectasis noted in the lower lungs bilaterally. No overtly suspicious pulmonary nodule or mass. Upper Abdomen: Unremarkable. Musculoskeletal: No worrisome lytic or sclerotic osseous abnormality. Review of the MIP images confirms the above findings. IMPRESSION: 1. No CT evidence for acute pulmonary embolus. 2. Focal airspace disease right lower lobe with a somewhat nodular ill-defined appearance. Imaging features likely related to pneumonia although follow-up CT in 3 months recommended to ensure resolution. 3. Tiny hiatal hernia. 4. Aortic Atherosclerosis (ICD10-I70.0). Electronically Signed   By: EMisty StanleyM.D.   On: 06/19/2022 06:29   DG Chest Port 1 View  Result Date: 06/19/2022 CLINICAL DATA:  Questionable sepsis.  Shortness of breath. EXAM: PORTABLE CHEST 1 VIEW COMPARISON:  05/31/2022 FINDINGS: 0438 hours. The cardio pericardial silhouette is enlarged. Interstitial markings are diffusely coarsened with chronic features. The lungs are clear without focal pneumonia, edema,  pneumothorax or pleural effusion. The visualized bony structures of the thorax are unremarkable. Telemetry leads overlie the chest. IMPRESSION: Chronic interstitial coarsening. No acute cardiopulmonary findings. Electronically Signed   By: EMisty StanleyM.D.   On: 06/19/2022 05:14   DG FEMUR PORT MIN 2 VIEWS LEFT  Result Date: 06/02/2022 CLINICAL DATA:  Postoperative. Closed intertrochanteric fracture left hip. EXAM: LEFT FEMUR PORTABLE 2 VIEWS COMPARISON:  Left hip radiographs 06/01/2022 FINDINGS: Interval left intramedullary nail with 2 proximal femoral neck interlocking screws fixation of the previously seen intertrochanteric fracture. There is improved alignment with resolution of the prior varus angulation. Minimal superior displacement of the distal fracture component with respect to the proximal fracture component. The lesser trochanter is again medially displaced. No evidence of hardware failure. Expected postoperative changes including lateral hip soft tissue swelling and subcutaneous air. Status post total left knee arthroplasty. Severe superior left femoroacetabular osteoarthritis. Moderate vascular calcifications. IMPRESSION: Interval ORIF of the prior left femoral intertrochanteric fracture with improved alignment. No evidence of hardware failure. Electronically Signed   By: RYvonne KendallM.D.   On: 06/02/2022 15:06   DG FEMUR MIN 2 VIEWS LEFT  Result Date: 06/02/2022 CLINICAL DATA:  Intramedullary nail EXAM: LEFT FEMUR 2 VIEWS; DG C-ARM 1-60 MIN-NO REPORT COMPARISON:  05/31/2022 FLUOROSCOPY: Air kerma 45.49 mGy FINDINGS: Intraoperative fluoroscopic images of the left femur demonstrate intramedullary nail fixation of intratrochanteric fractures of the left femur. Near anatomic alignment of fracture fragments. No obvious perihardware fracture or component  malpositioning. Pre-existing left knee arthroplasty. IMPRESSION: Intraoperative fluoroscopic images of the left femur demonstrate  intramedullary nail fixation of intratrochanteric fractures of the left femur. Near anatomic alignment of fracture fragments. No obvious perihardware fracture or component malpositioning. Electronically Signed   By: Delanna Ahmadi M.D.   On: 06/02/2022 13:21   DG C-Arm 1-60 Min-No Report  Result Date: 06/02/2022 CLINICAL DATA:  Intramedullary nail EXAM: LEFT FEMUR 2 VIEWS; DG C-ARM 1-60 MIN-NO REPORT COMPARISON:  05/31/2022 FLUOROSCOPY: Air kerma 45.49 mGy FINDINGS: Intraoperative fluoroscopic images of the left femur demonstrate intramedullary nail fixation of intratrochanteric fractures of the left femur. Near anatomic alignment of fracture fragments. No obvious perihardware fracture or component malpositioning. Pre-existing left knee arthroplasty. IMPRESSION: Intraoperative fluoroscopic images of the left femur demonstrate intramedullary nail fixation of intratrochanteric fractures of the left femur. Near anatomic alignment of fracture fragments. No obvious perihardware fracture or component malpositioning. Electronically Signed   By: Delanna Ahmadi M.D.   On: 06/02/2022 13:21   DG C-Arm 1-60 Min-No Report  Result Date: 06/02/2022 CLINICAL DATA:  Intramedullary nail EXAM: LEFT FEMUR 2 VIEWS; DG C-ARM 1-60 MIN-NO REPORT COMPARISON:  05/31/2022 FLUOROSCOPY: Air kerma 45.49 mGy FINDINGS: Intraoperative fluoroscopic images of the left femur demonstrate intramedullary nail fixation of intratrochanteric fractures of the left femur. Near anatomic alignment of fracture fragments. No obvious perihardware fracture or component malpositioning. Pre-existing left knee arthroplasty. IMPRESSION: Intraoperative fluoroscopic images of the left femur demonstrate intramedullary nail fixation of intratrochanteric fractures of the left femur. Near anatomic alignment of fracture fragments. No obvious perihardware fracture or component malpositioning. Electronically Signed   By: Delanna Ahmadi M.D.   On: 06/02/2022 13:21   DG  Knee Left Port  Result Date: 06/01/2022 CLINICAL DATA:  Pain after a fall. EXAM: PORTABLE LEFT KNEE - 1-2 VIEW COMPARISON:  06/27/2005 FINDINGS: Postoperative left total knee arthroplasty including patellar femoral component. Components appear well seated. No acute displaced fractures are identified. No focal bone lesion or bone destruction. No significant effusion. Soft tissues are unremarkable. Vascular calcifications. IMPRESSION: Left total knee arthroplasty. Components appear well seated. No acute displaced fractures. Electronically Signed   By: Lucienne Capers M.D.   On: 06/01/2022 22:18   DG Chest Portable 1 View  Result Date: 06/01/2022 CLINICAL DATA:  Pain after a fall. EXAM: PORTABLE CHEST 1 VIEW COMPARISON:  None Available. FINDINGS: Shallow inspiration. Cardiac enlargement. No vascular congestion, edema, or consolidation. No pleural effusions. No pneumothorax. Mediastinal contours appear intact. IMPRESSION: No active disease. Electronically Signed   By: Lucienne Capers M.D.   On: 06/01/2022 22:17   DG Ankle 2 Views Left  Result Date: 06/01/2022 CLINICAL DATA:  Pain after a fall. EXAM: LEFT ANKLE - 2 VIEW COMPARISON:  None Available. FINDINGS: Nonstandard positioning limits evaluation. As visualized, no evidence of acute fracture or dislocation. Degenerative changes are present in the intertarsal joints. Vascular calcifications in the soft tissues. IMPRESSION: No acute fractures identified.  Degenerative changes. Electronically Signed   By: Lucienne Capers M.D.   On: 06/01/2022 22:16   DG Hip Unilat W or Wo Pelvis 2-3 Views Left  Result Date: 06/01/2022 CLINICAL DATA:  Pain after a fall. EXAM: DG HIP (WITH OR WITHOUT PELVIS) 2-3V LEFT COMPARISON:  None Available. FINDINGS: Comminuted inter trochanteric fractures of the left proximal femur with displaced lesser trochanteric fragment and varus angulation of the hip. No dislocation. Pelvis and right hip appear intact. Vascular calcifications  are present. IMPRESSION: Comminuted inter trochanteric fractures of the left hip with varus angulation  and displacement of the lesser trochanteric fragment. Electronically Signed   By: Lucienne Capers M.D.   On: 06/01/2022 22:16   CT Cervical Spine Wo Contrast  Result Date: 06/01/2022 CLINICAL DATA:  Status post fall. EXAM: CT CERVICAL SPINE WITHOUT CONTRAST TECHNIQUE: Multidetector CT imaging of the cervical spine was performed without intravenous contrast. Multiplanar CT image reconstructions were also generated. RADIATION DOSE REDUCTION: This exam was performed according to the departmental dose-optimization program which includes automated exposure control, adjustment of the mA and/or kV according to patient size and/or use of iterative reconstruction technique. COMPARISON:  None Available. FINDINGS: Alignment: Normal. Skull base and vertebrae: No acute fracture. Chronic and degenerative changes are seen along the tip of the dens. Soft tissues and spinal canal: No prevertebral fluid or swelling. No visible canal hematoma. Disc levels: Mild endplate sclerosis and moderate to marked severity anterior osteophyte formation are seen at the levels of C2-C3, C3-C4, C4-C5, C5-C6, C6-C7 C7-T1. There is marked severity narrowing of the anterior atlantoaxial articulation. Mild to moderate severity intervertebral disc space narrowing is seen at C2-C3, C3-C4, C4-C5, C5-C6, C6-C7 and C7-T1. Bilateral moderate to marked severity multilevel facet joint hypertrophy is noted. Upper chest: Negative. Other: None. IMPRESSION: 1. No acute fracture or subluxation in the cervical spine. 2. Mild to moderate severity multilevel degenerative changes, as described above. Electronically Signed   By: Virgina Norfolk M.D.   On: 06/01/2022 21:28   CT Head Wo Contrast  Result Date: 06/01/2022 CLINICAL DATA:  Status post fall. EXAM: CT HEAD WITHOUT CONTRAST TECHNIQUE: Contiguous axial images were obtained from the base of the skull  through the vertex without intravenous contrast. RADIATION DOSE REDUCTION: This exam was performed according to the departmental dose-optimization program which includes automated exposure control, adjustment of the mA and/or kV according to patient size and/or use of iterative reconstruction technique. COMPARISON:  None Available. FINDINGS: Brain: There is mild cerebral atrophy with widening of the extra-axial spaces and ventricular dilatation. There are areas of decreased attenuation within the white matter tracts of the supratentorial brain, consistent with microvascular disease changes. Small, chronic bilateral basal ganglia lacunar infarcts are noted. Vascular: No hyperdense vessel or unexpected calcification. Skull: Normal. Negative for fracture or focal lesion. Sinuses/Orbits: No acute finding. Other: None. IMPRESSION: 1. No acute intracranial abnormality. 2. Generalized cerebral atrophy with chronic white matter small vessel ischemic changes. Electronically Signed   By: Virgina Norfolk M.D.   On: 06/01/2022 21:14    Microbiology: Recent Results (from the past 240 hour(s))  Resp Panel by RT-PCR (Flu A&B, Covid) Peripheral     Status: None   Collection Time: 06/19/22  4:28 AM   Specimen: Peripheral; Nasal Swab  Result Value Ref Range Status   SARS Coronavirus 2 by RT PCR NEGATIVE NEGATIVE Final    Comment: (NOTE) SARS-CoV-2 target nucleic acids are NOT DETECTED.  The SARS-CoV-2 RNA is generally detectable in upper respiratory specimens during the acute phase of infection. The lowest concentration of SARS-CoV-2 viral copies this assay can detect is 138 copies/mL. A negative result does not preclude SARS-Cov-2 infection and should not be used as the sole basis for treatment or other patient management decisions. A negative result may occur with  improper specimen collection/handling, submission of specimen other than nasopharyngeal swab, presence of viral mutation(s) within the areas  targeted by this assay, and inadequate number of viral copies(<138 copies/mL). A negative result must be combined with clinical observations, patient history, and epidemiological information. The expected result is Negative.  Fact  Sheet for Patients:  EntrepreneurPulse.com.au  Fact Sheet for Healthcare Providers:  IncredibleEmployment.be  This test is no t yet approved or cleared by the [place] FDA and  has been authorized for detection and/or diagnosis of SARS-CoV-2 by FDA under an Emergency Use Authorization (EUA). This EUA will remain  in effect (meaning this test can be used) for the duration of the COVID-19 declaration under Section 564(b)(1) of the Act, 21 U.S.C.section 360bbb-3(b)(1), unless the authorization is terminated  or revoked sooner.       Influenza A by PCR NEGATIVE NEGATIVE Final   Influenza B by PCR NEGATIVE NEGATIVE Final    Comment: (NOTE) The Xpert Xpress SARS-CoV-2/FLU/RSV plus assay is intended as an aid in the diagnosis of influenza from Nasopharyngeal swab specimens and should not be used as a sole basis for treatment. Nasal washings and aspirates are unacceptable for Xpert Xpress SARS-CoV-2/FLU/RSV testing.  Fact Sheet for Patients: EntrepreneurPulse.com.au  Fact Sheet for Healthcare Providers: IncredibleEmployment.be  This test is not yet approved or cleared by the [place] FDA and has been authorized for detection and/or diagnosis of SARS-CoV-2 by FDA under an Emergency Use Authorization (EUA). This EUA will remain in effect (meaning this test can be used) for the duration of the COVID-19 declaration under Section 564(b)(1) of the Act, 21 U.S.C. section 360bbb-3(b)(1), unless the authorization is terminated or revoked.  Performed at [facility], [place] [address], [place], [place] [address]   Blood Culture (routine x 2)     Status: None   Collection Time:  06/19/22  4:30 AM   Specimen: BLOOD RIGHT WRIST  Result Value Ref Range Status   Specimen Description BLOOD RIGHT WRIST  Final   Special Requests   Final    BOTTLES DRAWN AEROBIC AND ANAEROBIC Blood Culture adequate volume   Culture   Final    NO GROWTH 5 DAYS Performed at [facility], [place] [address], [place], [place] [address]    Report Status 06/24/2022 FINAL  Final  Blood Culture (routine x 2)     Status: None   Collection Time: 06/19/22  4:36 AM   Specimen: BLOOD LEFT HAND  Result Value Ref Range Status   Specimen Description BLOOD LEFT HAND  Final   Special Requests   Final    BOTTLES DRAWN AEROBIC AND ANAEROBIC Blood Culture results may not be optimal due to an inadequate volume of blood received in culture bottles   Culture   Final    NO GROWTH 5 DAYS Performed at [facility], [place] [address] [place], [place] [address]    Report Status 06/24/2022 FINAL  Final  Urine Culture     Status: Abnormal   Collection Time: 06/19/22  4:54 AM   Specimen: In/Out Cath Urine  Result Value Ref Range Status   Specimen Description IN/OUT CATH URINE  Final   Special Requests   Final    NONE Performed at [facility], [place] [address] [place],  [address]    Culture >=100,000 COLONIES/mL ESCHERICHIA COLI (A)  Final   Report Status 06/21/2022 FINAL  Final   Organism ID, Bacteria ESCHERICHIA COLI (A)  Final      Susceptibility   Escherichia coli - MIC*    AMPICILLIN <=2 SENSITIVE Sensitive     CEFAZOLIN <=4 SENSITIVE Sensitive     CEFEPIME <=0.12 SENSITIVE Sensitive     CEFTRIAXONE <=0.25 SENSITIVE Sensitive     CIPROFLOXACIN <=0.25 SENSITIVE Sensitive  GENTAMICIN <=1 SENSITIVE Sensitive     IMIPENEM <=0.25 SENSITIVE Sensitive     NITROFURANTOIN <=16 SENSITIVE Sensitive     TRIMETH/SULFA <=20 SENSITIVE Sensitive     AMPICILLIN/SULBACTAM <=2 SENSITIVE Sensitive     PIP/TAZO <=4 SENSITIVE Sensitive     * >=100,000 COLONIES/mL ESCHERICHIA COLI  MRSA Next Gen  by PCR, Nasal     Status: None   Collection Time: 06/20/22  3:58 PM   Specimen: Nasal Mucosa; Nasal Swab  Result Value Ref Range Status   MRSA by PCR Next Gen NOT DETECTED NOT DETECTED Final    Comment: (NOTE) The GeneXpert MRSA Assay (FDA approved for NASAL specimens only), is one component of a comprehensive MRSA colonization surveillance program. It is not intended to diagnose MRSA infection nor to guide or monitor treatment for MRSA infections. Test performance is not FDA approved in patients less than 36 years old. Performed at [facility], [place] [address] [place], [place] [address]      Labs: Basic Metabolic Panel: Recent Labs  Lab 06/24/22 0017 06/25/22 0144 06/26/22 0106 06/27/22 0100 06/28/22 0037  NA 137 137 137 136 137  K 3.7 3.8 3.7 3.9 3.6  CL 106 103 104 103 105  CO2 21* '22 24 23 23  '$ GLUCOSE 182* 194* 167* 183* 161*  BUN 41* 40* 44* 46* 41*  CREATININE 1.21* 1.19* 1.16* 1.30* 1.15*  CALCIUM 8.7* 8.6* 8.4* 8.6* 8.5*   Liver Function Tests: No results for input(s): "AST", "ALT", "ALKPHOS", "BILITOT", "PROT", "ALBUMIN" in the last 168 hours. No results for input(s): "LIPASE", "AMYLASE" in the last 168 hours. No results for input(s): "AMMONIA" in the last 168 hours. CBC: Recent Labs  Lab 06/24/22 0017 06/25/22 0144 06/26/22 0106 06/27/22 0100 06/28/22 0037  WBC 8.0 10.1 9.8 10.1 12.5*  HGB 10.1* 10.7* 10.1* 10.1* 9.9*  HCT 31.0* 32.8* 30.5* 31.7* 30.3*  MCV 86.6 86.3 86.9 87.6 86.3  PLT 389 425* 410* 387 412*   Cardiac Enzymes: No results for input(s): "CKTOTAL", "CKMB", "CKMBINDEX", "TROPONINI" in the last 168 hours. BNP: BNP (last 3 results) Recent Labs    06/19/22 0921  BNP 1,579.3*    ProBNP (last 3 results) No results for input(s): "PROBNP" in the last 8760 hours.  CBG: Recent Labs  Lab 06/27/22 1115 06/27/22 1423 06/27/22 1607 06/27/22 2126 06/28/22 0621  GLUCAP 224* 253* 180* 231* 136*       Signed:  Domenic Polite MD.  [facility] 06/28/2022, 9:35 AM

## 2022-06-28 NOTE — Progress Notes (Signed)
RN called report to [place] at [facility]. Copy of AVS provided to pt's husband and sent copy in packet to facility. Pt VSS at d/c.  IV's removed. Purewick removed before patient transport. Pt transported via [facility] to facility

## 2022-06-28 NOTE — Plan of Care (Signed)
  Problem: Education: Goal: Understanding of CV disease, CV risk reduction, and recovery process will improve Outcome: Adequate for Discharge Goal: Individualized Educational Video(s) Outcome: Adequate for Discharge   Problem: Activity: Goal: Ability to return to baseline activity level will improve Outcome: Adequate for Discharge   Problem: Cardiovascular: Goal: Ability to achieve and maintain adequate cardiovascular perfusion will improve Outcome: Adequate for Discharge Goal: Vascular access site(s) Level 0-1 will be maintained Outcome: Adequate for Discharge   Problem: Health Behavior/Discharge Planning: Goal: Ability to safely manage health-related needs after discharge will improve Outcome: Adequate for Discharge   Problem: Activity: Goal: Ability to tolerate increased activity will improve Outcome: Adequate for Discharge   Problem: Clinical Measurements: Goal: Ability to maintain a body temperature in the normal range will improve Outcome: Adequate for Discharge   Problem: Respiratory: Goal: Ability to maintain adequate ventilation will improve Outcome: Adequate for Discharge Goal: Ability to maintain a clear airway will improve Outcome: Adequate for Discharge   Problem: Education: Goal: Knowledge of General Education information will improve Description: Including pain rating scale, medication(s)/side effects and non-pharmacologic comfort measures Outcome: Adequate for Discharge   Problem: Health Behavior/Discharge Planning: Goal: Ability to manage health-related needs will improve Outcome: Adequate for Discharge   Problem: Clinical Measurements: Goal: Ability to maintain clinical measurements within normal limits will improve Outcome: Adequate for Discharge Goal: Will remain free from infection Outcome: Adequate for Discharge Goal: Diagnostic test results will improve Outcome: Adequate for Discharge Goal: Respiratory complications will improve Outcome:  Adequate for Discharge Goal: Cardiovascular complication will be avoided Outcome: Adequate for Discharge   Problem: Activity: Goal: Risk for activity intolerance will decrease Outcome: Adequate for Discharge   Problem: Nutrition: Goal: Adequate nutrition will be maintained Outcome: Adequate for Discharge   Problem: Coping: Goal: Level of anxiety will decrease Outcome: Adequate for Discharge   Problem: Elimination: Goal: Will not experience complications related to bowel motility Outcome: Adequate for Discharge Goal: Will not experience complications related to urinary retention Outcome: Adequate for Discharge   Problem: Pain Managment: Goal: General experience of comfort will improve Outcome: Adequate for Discharge   Problem: Safety: Goal: Ability to remain free from injury will improve Outcome: Adequate for Discharge   Problem: Skin Integrity: Goal: Risk for impaired skin integrity will decrease Outcome: Adequate for Discharge

## 2022-06-29 ENCOUNTER — Ambulatory Visit: Payer: PPO | Admitting: Cardiovascular Disease

## 2022-07-09 NOTE — Progress Notes (Unsigned)
Office Visit    Patient Name: Danielle Murray Date of Encounter: 07/10/2022  PCP:  [name], [facility]  Cardiologist:  Jenkins Rouge, MD  Advanced Practice Provider:  No care team member to display Electrophysiologist:  None   HPI    Danielle Murray is a 81 y.o. female with past medical history significant for CAD status post stent to LAD and medical management for small left-sided PDA (10/14), hypertension, hyperlipidemia, IDDM, SVT, diabetes mellitus type 2 presents today for post PCI follow-up.  The patient presented with fever, max of 102.  Her work-up was significant for NSTEMI, CHF, and pneumonia.  Echocardiogram showed EF 35 to 40% with wall motion abnormalities.  Troponin significantly elevated and peaked at 2400 with a EKG showing acute changes.  Treated initially with IV heparin.  Cardiac catheterization showed moderate three-vessel CAD.  Now she is Status post successful shockwave assisted stent to the ostial to proximal circumflex.  Recommend aspirin and Plavix x6 months then monotherapy with Plavix.   Last seen in the office by Dr. Admission 04/2021.  She had a low risk Myoview June 2018.  No angina at that time.  Noticed some lower extremity edema.  TTE 11/09/2020 showed EF 55 to 60% with grade 2 diastolic dysfunction, no valvular disease.  Today, she has been doing pretty well.  She is doing some walking and band work at rehab.  They are working on both her upper and lower extremity.  She had a fall back in July and broke her hip and now her left hip has a rod in it.  She has been working on mobility.  She is doing this at [facility].  We discussed repeating an echocardiogram in 2 to 3 months to reevaluate her ejection fraction.  We also discussed better control of her blood glucose level.  We will have the facility order a lipid panel and LFTs.  Reports no shortness of breath nor dyspnea on exertion. Reports no chest pain, pressure, or  tightness. No edema, orthopnea, PND. Reports no palpitations.   Past Medical History    Past Medical History:  Diagnosis Date   Arthritis    "fingers, all my joints" (09/03/2013)   Coronary artery disease    a. 08/2013: unstable angina s/p PTCA/DES to LAD, medical therapy for residual severe stenosis of a mid-distal left PDA branch of large dominant LCx, moderate RCA disease (consider PCI of L-PDA if she fails med rx).   DEPRESSION    Diverticulosis    Hyperlipidemia    Hypertension    HYPOTHYROIDISM    Preseptal cellulitis of right upper eyelid 03/10/2016   SVT (supraventricular tachycardia) ([place])    a. very brief transient SVT during 08/2013 admission overnight.   Type II diabetes mellitus ([place])    Dr Chalmers Cater   Past Surgical History:  Procedure Laterality Date   ABDOMINAL HYSTERECTOMY  1989   no BSO; dysfunctional menses   CATARACT EXTRACTION W/ INTRAOCULAR LENS  IMPLANT, BILATERAL Bilateral 2014   CATARACT EXTRACTION W/PHACO Right 07/02/2013   CATARACT EXTRACTION W/PHACO Left 06/01/2013   COLONOSCOPY  2003   Tics   CORONARY ANGIOPLASTY WITH STENT PLACEMENT  09/03/2013   "1" (09/03/2013)   CORONARY LITHOTRIPSY N/A 06/25/2022   Procedure: CORONARY LITHOTRIPSY;  Surgeon: Belva Crome, MD;  Location: [place] [facility];  Service: Cardiovascular;  Laterality: N/A;   CORONARY STENT INTERVENTION N/A 06/25/2022   Procedure: CORONARY STENT INTERVENTION;  Surgeon: Tamala Julian,  Lynnell Dike, MD;  Location: [place] [facility];  Service: Cardiovascular;  Laterality: N/A;   FEMUR IM NAIL Left 06/02/2022   Procedure: INTRAMEDULLARY (IM) NAIL FEMORAL;  Surgeon: Willaim Sheng, MD;  Location: [place];  Service: Orthopedics;  Laterality: Left;   INTRAVASCULAR ULTRASOUND/IVUS N/A 06/25/2022   Procedure: Intravascular Ultrasound/IVUS;  Surgeon: Belva Crome, MD;  Location: [place] [facility];  Service: Cardiovascular;  Laterality: N/A;   LEFT HEART CATHETERIZATION WITH CORONARY ANGIOGRAM N/A 09/03/2013    Procedure: LEFT HEART CATHETERIZATION WITH CORONARY ANGIOGRAM;  Surgeon: Blane Ohara, MD;  Location: [facility] C[facility];  Service: Cardiovascular;  Laterality: N/A;   RIGHT/LEFT HEART CATH AND CORONARY ANGIOGRAPHY N/A 06/21/2022   Procedure: RIGHT/LEFT HEART CATH AND CORONARY ANGIOGRAPHY;  Surgeon: Belva Crome, MD;  Location: [place] [facility];  Service: Cardiovascular;  Laterality: N/A;   TOTAL KNEE ARTHROPLASTY Left 06/2005   Dr Gladstone Lighter    Allergies  Allergies  Allergen Reactions   Penicillins     Diffuse joint pain @ age 16 in context of Scarlet Fever  Did it involve swelling of the face/tongue/throat, SOB, or low BP? No Did it involve sudden or severe rash/hives, skin peeling, or any reaction on the inside of your mouth or nose? No Did you need to seek medical attention at a hospital or doctor's office? No When did it last happen?  childhood     If all above answers are "NO", may proceed with cephalosporin use.       EKGs/Labs/Other Studies Reviewed:   The following studies were reviewed today:  Cardiac catheterization 06/25/2022  Left Anterior Descending  Vessel is small. There is mild diffuse disease throughout the vessel.  Prox LAD to Mid LAD lesion is 30% stenosed. The lesion was previously treated .    First Diagonal Branch  Vessel is small in size.  1st Diag lesion is 90% stenosed.    Left Circumflex  Ost Cx to Prox Cx lesion is 70% stenosed.    First Obtuse Marginal Branch  1st Mrg lesion is 70% stenosed.    Third Left Posterolateral Branch  Vessel is large in size.  3rd LPL lesion is 70% stenosed.    Intervention   Ost Cx to Prox Cx lesion  Stent  A stent was successfully placed.  Post-Intervention Lesion Assessment  The intervention was successful. Pre-interventional TIMI flow is 3. Post-intervention TIMI flow is 3.  There is a 10% residual stenosis post intervention.     Coronary Diagrams  Diagnostic Dominance:  Co-dominant  Intervention   Implants     Echocardiogram 06/19/2022 IMPRESSIONS     1. No left ventricular thrombus is seen (Definity contrast was used).  Wall motion suggests infarction due to occlusion of the mid-LAD artery or  a large ramus intermedius vessel that reaches the lateral apex. Left  ventricular ejection fraction, by  estimation, is 35 to 40%. The left ventricle has moderately decreased  function. The left ventricle demonstrates regional wall motion  abnormalities (see scoring diagram/findings for description). There is  mild concentric left ventricular hypertrophy. Left   ventricular diastolic parameters are consistent with Grade II diastolic  dysfunction (pseudonormalization). Elevated left atrial pressure.   2. Right ventricular systolic function is normal. The right ventricular  size is normal. There is normal pulmonary artery systolic pressure. The  estimated right ventricular systolic pressure is 94.4 mmHg.   3. Left atrial size was severely dilated.   4. The mitral valve is degenerative. Mild to  moderate mitral valve  regurgitation. No evidence of mitral stenosis. The mean mitral valve  gradient is 3.1 mmHg. Moderate mitral annular calcification.   5. Tricuspid valve regurgitation is mild to moderate.   6. The aortic valve is tricuspid. Aortic valve regurgitation is not  visualized. No aortic stenosis is present.   Comparison(s): Prior images unable to be directly viewed, comparison made  by report only. The left ventricular function is significantly worse. The  left ventricular wall motion abnormalities are new.   EKG:  EKG is not ordered today.    Recent Labs: 06/19/2022: ALT 12; B Natriuretic Peptide 1,579.3 06/28/2022: BUN 41; Creatinine, Ser 1.15; Hemoglobin 9.9; Platelets 412; Potassium 3.6; Sodium 137  Recent Lipid Panel    Component Value Date/Time   CHOL 130 10/31/2018 0827   CHOL 157 03/10/2015 0802   TRIG 174 (H) 10/31/2018 0827   TRIG 294  (H) 03/10/2015 0802   TRIG 224 (HH) 09/20/2006 0915   HDL 36 (L) 10/31/2018 0827   HDL 38 (L) 03/10/2015 0802   CHOLHDL 3.6 10/31/2018 0827   CHOLHDL 4 03/03/2014 0749   VLDL 32.2 03/03/2014 0749   LDLCALC 59 10/31/2018 0827   LDLCALC 60 03/10/2015 0802   LDLDIRECT 155.1 08/10/2013 1140   Home Medications   Current Meds  Medication Sig   acetaminophen (TYLENOL) 325 MG tablet Take 2 tablets (650 mg total) by mouth every 4 (four) hours as needed for headache or mild pain.   aspirin 81 MG chewable tablet Chew 1 tablet (81 mg total) by mouth daily.   carvedilol (COREG) 6.25 MG tablet TAKE 1 TABLET BY MOUTH TWICE DAILY WITH A MEAL   Cholecalciferol (VITAMIN D3 PO) Take 1 tablet by mouth daily.   clopidogrel (PLAVIX) 75 MG tablet Take 1 tablet by mouth once daily   empagliflozin (JARDIANCE) 10 MG TABS tablet Take 1 tablet (10 mg total) by mouth daily.   furosemide (LASIX) 40 MG tablet Take 1 tablet (40 mg total) by mouth daily.   iron polysaccharides (NIFEREX) 150 MG capsule Take 1 capsule (150 mg total) by mouth daily.   isosorbide mononitrate (IMDUR) 30 MG 24 hr tablet Take 1 tablet by mouth once daily   levothyroxine (SYNTHROID, LEVOTHROID) 88 MCG tablet Take 88 mcg by mouth at bedtime.    losartan (COZAAR) 25 MG tablet Take 0.5 tablets (12.5 mg total) by mouth daily.   nitroGLYCERIN (NITROSTAT) 0.4 MG SL tablet Place 1 tablet (0.4 mg total) under the tongue every 5 (five) minutes as needed for chest pain (3 doses max).   NOVOLIN 70/30 KWIKPEN (70-30) 100 UNIT/ML KwikPen Inject 20 Units into the skin in the morning and at bedtime. Take 30 units in the morning and Take 50 units at bedtime   Probiotic Product (PROBIOTIC PO) Take 1 tablet by mouth daily.   rosuvastatin (CRESTOR) 5 MG tablet Take 1 tablet (5 mg total) by mouth daily.   senna-docusate (SENOKOT-S) 8.6-50 MG tablet Take 1 tablet by mouth 2 (two) times daily.   spironolactone (ALDACTONE) 25 MG tablet Take 0.5 tablets (12.5 mg  total) by mouth daily.     Review of Systems      All other systems reviewed and are otherwise negative except as noted above.  Physical Exam    VS:  BP 130/64   Pulse 65   Ht '5\' 4"'$  (1.626 m)   Wt 176 lb (79.8 kg)   SpO2 99%   BMI 30.21 kg/m  , BMI Body mass index is  30.21 kg/m.  Wt Readings from Last 3 Encounters:  07/10/22 176 lb (79.8 kg)  06/19/22 187 lb (84.8 kg)  06/02/22 185 lb (83.9 kg)     GEN: Well nourished, well developed, in no acute distress. HEENT: normal. Neck: Supple, no JVD, carotid bruits, or masses. Cardiac: RRR, no murmurs, rubs, or gallops. No clubbing, cyanosis, 1 + pitting bilateral edema.  Radials/PT 2+ and equal bilaterally.  Respiratory:  Respirations regular and unlabored, clear to auscultation bilaterally. GI: Soft, nontender, nondistended. MS: No deformity or atrophy. Skin: Warm and dry, no rash. Neuro:  Strength and sensation are intact. Psych: Normal affect.  Assessment & Plan    NSTEMI/recent PCI -No chest pain or shortness of breath since stent placement -Continue GDMT: Aspirin 81 mg, Plavix 75 mg, Coreg 6.25 mg twice daily, Jardiance 10 mg daily, Lasix 40 mg daily, Imdur 30 mg daily, Cozaar 12.5 mg daily, sublingual nitro as needed, Crestor 5 mg daily, spironolactone 12.5 mg daily -radial site without hematoma but extensive ecchymosis -She is currently enrolled in rehab at [facility] for her hip and they are also doing some cardiac rehab as well  Diabetes mellitus -A1C 7.5  -follow up with Endo -sugars have been not well controlled with frequent values over 200  Hypertension -Blood pressure well controlled today 130/64 -Patient states that her blood pressures have been within a good range during therapy -Continue current medication regimen  Hyperlipidemia -We have ordered a lipid panel and LFTs for the facility to perform.  Please send [place] these values  PVD -no calf pain just pain behind her knees from therapy  Acute  systolic and chronic diastolic CHF -follow-up echo in 2 months -She had reduced EF 35 to 40%, grade 2 DD -euvolemic -Small amount of lower extremity edema.  Continue current diuretic regimen -Provided handout for elastic therapy         Cardiac Rehabilitation Eligibility Assessment  The patient is ready to start cardiac rehabilitation from a cardiac standpoint.   Disposition: Follow up 3 months with Jenkins Rouge, MD or APP.  Signed, Elgie Collard, PA-C 07/10/2022, 10:38 AM [place]

## 2022-07-10 ENCOUNTER — Encounter: Payer: Self-pay | Admitting: Physician Assistant

## 2022-07-10 ENCOUNTER — Ambulatory Visit: Payer: PPO | Attending: Physician Assistant | Admitting: Physician Assistant

## 2022-07-10 VITALS — BP 130/64 | HR 65 | Ht 64.0 in | Wt 176.0 lb

## 2022-07-10 DIAGNOSIS — I5021 Acute systolic (congestive) heart failure: Secondary | ICD-10-CM | POA: Diagnosis not present

## 2022-07-10 DIAGNOSIS — R0609 Other forms of dyspnea: Secondary | ICD-10-CM | POA: Diagnosis not present

## 2022-07-10 DIAGNOSIS — I739 Peripheral vascular disease, unspecified: Secondary | ICD-10-CM | POA: Diagnosis not present

## 2022-07-10 DIAGNOSIS — I2510 Atherosclerotic heart disease of native coronary artery without angina pectoris: Secondary | ICD-10-CM | POA: Diagnosis not present

## 2022-07-10 DIAGNOSIS — I10 Essential (primary) hypertension: Secondary | ICD-10-CM

## 2022-07-10 DIAGNOSIS — E785 Hyperlipidemia, unspecified: Secondary | ICD-10-CM

## 2022-07-10 DIAGNOSIS — I5043 Acute on chronic combined systolic (congestive) and diastolic (congestive) heart failure: Secondary | ICD-10-CM | POA: Diagnosis not present

## 2022-07-10 NOTE — Patient Instructions (Signed)
Medication Instructions:  Your physician recommends that you continue on your current medications as directed. Please refer to the Current Medication list given to you today.  *If you need a refill on your cardiac medications before your next appointment, please call your pharmacy*   Lab Work: None If you have labs (blood work) drawn today and your tests are completely normal, you will receive your results only by: [place] (if you have MyChart) OR A paper copy in the mail If you have any lab test that is abnormal or we need to change your treatment, we will call you to review the results.   Testing/Procedures: Your physician has requested that you have an echocardiogram to be done in November. Echocardiography is a painless test that uses sound waves to create images of your heart. It provides your doctor with information about the size and shape of your heart and how well your heart's chambers and valves are working. This procedure takes approximately one hour. There are no restrictions for this procedure.    Follow-Up: At [facility], you and your health needs are our priority.  As part of our continuing mission to provide you with exceptional heart care, we have created designated Provider Care Teams.  These Care Teams include your primary Cardiologist (physician) and Advanced Practice Providers (APPs -  Physician Assistants and Nurse Practitioners) who all work together to provide you with the care you need, when you need it.  Your next appointment:   3 month(s)  The format for your next appointment:   In Person  Provider:   Jenkins Rouge, MD  or Nicholes Rough, PA-C      Important Information About Sugar

## 2022-07-17 DIAGNOSIS — S72142D Displaced intertrochanteric fracture of left femur, subsequent encounter for closed fracture with routine healing: Secondary | ICD-10-CM | POA: Diagnosis not present

## 2022-07-17 DIAGNOSIS — R2689 Other abnormalities of gait and mobility: Secondary | ICD-10-CM | POA: Diagnosis not present

## 2022-07-21 DIAGNOSIS — Z955 Presence of coronary angioplasty implant and graft: Secondary | ICD-10-CM | POA: Diagnosis not present

## 2022-07-21 DIAGNOSIS — I214 Non-ST elevation (NSTEMI) myocardial infarction: Secondary | ICD-10-CM | POA: Diagnosis not present

## 2022-07-21 DIAGNOSIS — Z794 Long term (current) use of insulin: Secondary | ICD-10-CM | POA: Diagnosis not present

## 2022-07-21 DIAGNOSIS — I2510 Atherosclerotic heart disease of native coronary artery without angina pectoris: Secondary | ICD-10-CM | POA: Diagnosis not present

## 2022-07-21 DIAGNOSIS — D500 Iron deficiency anemia secondary to blood loss (chronic): Secondary | ICD-10-CM | POA: Diagnosis not present

## 2022-07-21 DIAGNOSIS — Z79899 Other long term (current) drug therapy: Secondary | ICD-10-CM | POA: Diagnosis not present

## 2022-07-21 DIAGNOSIS — I5021 Acute systolic (congestive) heart failure: Secondary | ICD-10-CM | POA: Diagnosis not present

## 2022-07-21 DIAGNOSIS — S72142D Displaced intertrochanteric fracture of left femur, subsequent encounter for closed fracture with routine healing: Secondary | ICD-10-CM | POA: Diagnosis not present

## 2022-07-21 DIAGNOSIS — E785 Hyperlipidemia, unspecified: Secondary | ICD-10-CM | POA: Diagnosis not present

## 2022-07-21 DIAGNOSIS — E119 Type 2 diabetes mellitus without complications: Secondary | ICD-10-CM | POA: Diagnosis not present

## 2022-07-21 DIAGNOSIS — Z8701 Personal history of pneumonia (recurrent): Secondary | ICD-10-CM | POA: Diagnosis not present

## 2022-07-21 DIAGNOSIS — E039 Hypothyroidism, unspecified: Secondary | ICD-10-CM | POA: Diagnosis not present

## 2022-07-21 DIAGNOSIS — Z9181 History of falling: Secondary | ICD-10-CM | POA: Diagnosis not present

## 2022-07-21 DIAGNOSIS — Z7984 Long term (current) use of oral hypoglycemic drugs: Secondary | ICD-10-CM | POA: Diagnosis not present

## 2022-07-21 DIAGNOSIS — I110 Hypertensive heart disease with heart failure: Secondary | ICD-10-CM | POA: Diagnosis not present

## 2022-07-21 DIAGNOSIS — L89312 Pressure ulcer of right buttock, stage 2: Secondary | ICD-10-CM | POA: Diagnosis not present

## 2022-07-22 ENCOUNTER — Other Ambulatory Visit: Payer: Self-pay | Admitting: Cardiovascular Disease

## 2022-07-24 ENCOUNTER — Other Ambulatory Visit (HOSPITAL_COMMUNITY): Payer: PPO

## 2022-08-01 DIAGNOSIS — Z8781 Personal history of (healed) traumatic fracture: Secondary | ICD-10-CM | POA: Diagnosis not present

## 2022-08-01 DIAGNOSIS — E1121 Type 2 diabetes mellitus with diabetic nephropathy: Secondary | ICD-10-CM | POA: Diagnosis not present

## 2022-08-01 DIAGNOSIS — I25118 Atherosclerotic heart disease of native coronary artery with other forms of angina pectoris: Secondary | ICD-10-CM | POA: Diagnosis not present

## 2022-08-01 DIAGNOSIS — Z09 Encounter for follow-up examination after completed treatment for conditions other than malignant neoplasm: Secondary | ICD-10-CM | POA: Diagnosis not present

## 2022-08-01 DIAGNOSIS — E039 Hypothyroidism, unspecified: Secondary | ICD-10-CM | POA: Diagnosis not present

## 2022-08-01 DIAGNOSIS — J189 Pneumonia, unspecified organism: Secondary | ICD-10-CM | POA: Diagnosis not present

## 2022-08-01 DIAGNOSIS — R5381 Other malaise: Secondary | ICD-10-CM | POA: Diagnosis not present

## 2022-08-01 DIAGNOSIS — I252 Old myocardial infarction: Secondary | ICD-10-CM | POA: Diagnosis not present

## 2022-08-01 DIAGNOSIS — D649 Anemia, unspecified: Secondary | ICD-10-CM | POA: Diagnosis not present

## 2022-08-01 DIAGNOSIS — Z955 Presence of coronary angioplasty implant and graft: Secondary | ICD-10-CM | POA: Diagnosis not present

## 2022-08-02 DIAGNOSIS — Z79899 Other long term (current) drug therapy: Secondary | ICD-10-CM | POA: Diagnosis not present

## 2022-08-02 DIAGNOSIS — D500 Iron deficiency anemia secondary to blood loss (chronic): Secondary | ICD-10-CM | POA: Diagnosis not present

## 2022-08-02 DIAGNOSIS — E119 Type 2 diabetes mellitus without complications: Secondary | ICD-10-CM | POA: Diagnosis not present

## 2022-08-02 DIAGNOSIS — I2510 Atherosclerotic heart disease of native coronary artery without angina pectoris: Secondary | ICD-10-CM | POA: Diagnosis not present

## 2022-08-02 DIAGNOSIS — I110 Hypertensive heart disease with heart failure: Secondary | ICD-10-CM | POA: Diagnosis not present

## 2022-08-02 DIAGNOSIS — Z8701 Personal history of pneumonia (recurrent): Secondary | ICD-10-CM | POA: Diagnosis not present

## 2022-08-02 DIAGNOSIS — I5021 Acute systolic (congestive) heart failure: Secondary | ICD-10-CM | POA: Diagnosis not present

## 2022-08-02 DIAGNOSIS — L89312 Pressure ulcer of right buttock, stage 2: Secondary | ICD-10-CM | POA: Diagnosis not present

## 2022-08-02 DIAGNOSIS — Z794 Long term (current) use of insulin: Secondary | ICD-10-CM | POA: Diagnosis not present

## 2022-08-02 DIAGNOSIS — S72142D Displaced intertrochanteric fracture of left femur, subsequent encounter for closed fracture with routine healing: Secondary | ICD-10-CM | POA: Diagnosis not present

## 2022-08-02 DIAGNOSIS — Z955 Presence of coronary angioplasty implant and graft: Secondary | ICD-10-CM | POA: Diagnosis not present

## 2022-08-02 DIAGNOSIS — I214 Non-ST elevation (NSTEMI) myocardial infarction: Secondary | ICD-10-CM | POA: Diagnosis not present

## 2022-08-02 DIAGNOSIS — E785 Hyperlipidemia, unspecified: Secondary | ICD-10-CM | POA: Diagnosis not present

## 2022-08-02 DIAGNOSIS — E039 Hypothyroidism, unspecified: Secondary | ICD-10-CM | POA: Diagnosis not present

## 2022-08-02 DIAGNOSIS — Z7984 Long term (current) use of oral hypoglycemic drugs: Secondary | ICD-10-CM | POA: Diagnosis not present

## 2022-08-02 DIAGNOSIS — Z9181 History of falling: Secondary | ICD-10-CM | POA: Diagnosis not present

## 2022-08-09 ENCOUNTER — Telehealth: Payer: Self-pay | Admitting: Cardiovascular Disease

## 2022-08-09 DIAGNOSIS — I2510 Atherosclerotic heart disease of native coronary artery without angina pectoris: Secondary | ICD-10-CM | POA: Diagnosis not present

## 2022-08-09 DIAGNOSIS — I214 Non-ST elevation (NSTEMI) myocardial infarction: Secondary | ICD-10-CM | POA: Diagnosis not present

## 2022-08-09 DIAGNOSIS — E039 Hypothyroidism, unspecified: Secondary | ICD-10-CM | POA: Diagnosis not present

## 2022-08-09 DIAGNOSIS — I110 Hypertensive heart disease with heart failure: Secondary | ICD-10-CM | POA: Diagnosis not present

## 2022-08-09 DIAGNOSIS — Z955 Presence of coronary angioplasty implant and graft: Secondary | ICD-10-CM | POA: Diagnosis not present

## 2022-08-09 DIAGNOSIS — E785 Hyperlipidemia, unspecified: Secondary | ICD-10-CM | POA: Diagnosis not present

## 2022-08-09 DIAGNOSIS — L89312 Pressure ulcer of right buttock, stage 2: Secondary | ICD-10-CM | POA: Diagnosis not present

## 2022-08-09 DIAGNOSIS — Z79899 Other long term (current) drug therapy: Secondary | ICD-10-CM | POA: Diagnosis not present

## 2022-08-09 DIAGNOSIS — Z794 Long term (current) use of insulin: Secondary | ICD-10-CM | POA: Diagnosis not present

## 2022-08-09 DIAGNOSIS — Z9181 History of falling: Secondary | ICD-10-CM | POA: Diagnosis not present

## 2022-08-09 DIAGNOSIS — I5021 Acute systolic (congestive) heart failure: Secondary | ICD-10-CM | POA: Diagnosis not present

## 2022-08-09 DIAGNOSIS — S72142D Displaced intertrochanteric fracture of left femur, subsequent encounter for closed fracture with routine healing: Secondary | ICD-10-CM | POA: Diagnosis not present

## 2022-08-09 DIAGNOSIS — D500 Iron deficiency anemia secondary to blood loss (chronic): Secondary | ICD-10-CM | POA: Diagnosis not present

## 2022-08-09 DIAGNOSIS — E119 Type 2 diabetes mellitus without complications: Secondary | ICD-10-CM | POA: Diagnosis not present

## 2022-08-09 DIAGNOSIS — Z7984 Long term (current) use of oral hypoglycemic drugs: Secondary | ICD-10-CM | POA: Diagnosis not present

## 2022-08-09 DIAGNOSIS — Z8701 Personal history of pneumonia (recurrent): Secondary | ICD-10-CM | POA: Diagnosis not present

## 2022-08-09 MED ORDER — FUROSEMIDE 40 MG PO TABS: 40.0000 mg | ORAL_TABLET | Freq: Every day | ORAL | 3 refills | Status: AC

## 2022-08-09 MED ORDER — SPIRONOLACTONE 25 MG PO TABS: 12.5000 mg | ORAL_TABLET | Freq: Every day | ORAL | 3 refills | Status: DC

## 2022-08-09 MED ORDER — EMPAGLIFLOZIN 10 MG PO TABS: 10.0000 mg | ORAL_TABLET | Freq: Every day | ORAL | 3 refills | Status: DC

## 2022-08-09 NOTE — Telephone Encounter (Signed)
*  STAT* If patient is at the pharmacy, call can be transferred to refill team.   1. Which medications need to be refilled? (please list name of each medication and dose if known)  empagliflozin (JARDIANCE) 10 MG TABS tablet furosemide (LASIX) 40 MG tablet spironolactone (ALDACTONE) 25 MG tablet  2. Which pharmacy/location (including street and city if local pharmacy) is medication to be sent to? [place], [place].  3. Do they need a 30 day or 90 day supply? 90 day  Patient is completely out of medication

## 2022-08-09 NOTE — Telephone Encounter (Signed)
Pt's medications were sent to pt's pharmacy as requested. Confirmation received.  

## 2022-08-13 DIAGNOSIS — I214 Non-ST elevation (NSTEMI) myocardial infarction: Secondary | ICD-10-CM | POA: Diagnosis not present

## 2022-08-13 DIAGNOSIS — S72142D Displaced intertrochanteric fracture of left femur, subsequent encounter for closed fracture with routine healing: Secondary | ICD-10-CM | POA: Diagnosis not present

## 2022-08-13 DIAGNOSIS — I110 Hypertensive heart disease with heart failure: Secondary | ICD-10-CM | POA: Diagnosis not present

## 2022-08-13 DIAGNOSIS — Z79899 Other long term (current) drug therapy: Secondary | ICD-10-CM | POA: Diagnosis not present

## 2022-08-13 DIAGNOSIS — I2510 Atherosclerotic heart disease of native coronary artery without angina pectoris: Secondary | ICD-10-CM | POA: Diagnosis not present

## 2022-08-13 DIAGNOSIS — L89312 Pressure ulcer of right buttock, stage 2: Secondary | ICD-10-CM | POA: Diagnosis not present

## 2022-08-13 DIAGNOSIS — Z955 Presence of coronary angioplasty implant and graft: Secondary | ICD-10-CM | POA: Diagnosis not present

## 2022-08-13 DIAGNOSIS — Z794 Long term (current) use of insulin: Secondary | ICD-10-CM | POA: Diagnosis not present

## 2022-08-13 DIAGNOSIS — E119 Type 2 diabetes mellitus without complications: Secondary | ICD-10-CM | POA: Diagnosis not present

## 2022-08-13 DIAGNOSIS — Z7984 Long term (current) use of oral hypoglycemic drugs: Secondary | ICD-10-CM | POA: Diagnosis not present

## 2022-08-13 DIAGNOSIS — Z9181 History of falling: Secondary | ICD-10-CM | POA: Diagnosis not present

## 2022-08-13 DIAGNOSIS — D500 Iron deficiency anemia secondary to blood loss (chronic): Secondary | ICD-10-CM | POA: Diagnosis not present

## 2022-08-13 DIAGNOSIS — I5021 Acute systolic (congestive) heart failure: Secondary | ICD-10-CM | POA: Diagnosis not present

## 2022-08-13 DIAGNOSIS — Z8701 Personal history of pneumonia (recurrent): Secondary | ICD-10-CM | POA: Diagnosis not present

## 2022-08-13 DIAGNOSIS — E039 Hypothyroidism, unspecified: Secondary | ICD-10-CM | POA: Diagnosis not present

## 2022-08-13 DIAGNOSIS — E785 Hyperlipidemia, unspecified: Secondary | ICD-10-CM | POA: Diagnosis not present

## 2022-08-15 DIAGNOSIS — E039 Hypothyroidism, unspecified: Secondary | ICD-10-CM | POA: Diagnosis not present

## 2022-08-15 DIAGNOSIS — I214 Non-ST elevation (NSTEMI) myocardial infarction: Secondary | ICD-10-CM | POA: Diagnosis not present

## 2022-08-15 DIAGNOSIS — I5021 Acute systolic (congestive) heart failure: Secondary | ICD-10-CM | POA: Diagnosis not present

## 2022-08-15 DIAGNOSIS — S72142D Displaced intertrochanteric fracture of left femur, subsequent encounter for closed fracture with routine healing: Secondary | ICD-10-CM | POA: Diagnosis not present

## 2022-08-15 DIAGNOSIS — Z8701 Personal history of pneumonia (recurrent): Secondary | ICD-10-CM | POA: Diagnosis not present

## 2022-08-15 DIAGNOSIS — I110 Hypertensive heart disease with heart failure: Secondary | ICD-10-CM | POA: Diagnosis not present

## 2022-08-15 DIAGNOSIS — Z9181 History of falling: Secondary | ICD-10-CM | POA: Diagnosis not present

## 2022-08-15 DIAGNOSIS — Z79899 Other long term (current) drug therapy: Secondary | ICD-10-CM | POA: Diagnosis not present

## 2022-08-15 DIAGNOSIS — E785 Hyperlipidemia, unspecified: Secondary | ICD-10-CM | POA: Diagnosis not present

## 2022-08-15 DIAGNOSIS — D500 Iron deficiency anemia secondary to blood loss (chronic): Secondary | ICD-10-CM | POA: Diagnosis not present

## 2022-08-15 DIAGNOSIS — Z794 Long term (current) use of insulin: Secondary | ICD-10-CM | POA: Diagnosis not present

## 2022-08-15 DIAGNOSIS — L89312 Pressure ulcer of right buttock, stage 2: Secondary | ICD-10-CM | POA: Diagnosis not present

## 2022-08-15 DIAGNOSIS — I2510 Atherosclerotic heart disease of native coronary artery without angina pectoris: Secondary | ICD-10-CM | POA: Diagnosis not present

## 2022-08-15 DIAGNOSIS — Z955 Presence of coronary angioplasty implant and graft: Secondary | ICD-10-CM | POA: Diagnosis not present

## 2022-08-15 DIAGNOSIS — E119 Type 2 diabetes mellitus without complications: Secondary | ICD-10-CM | POA: Diagnosis not present

## 2022-08-15 DIAGNOSIS — Z7984 Long term (current) use of oral hypoglycemic drugs: Secondary | ICD-10-CM | POA: Diagnosis not present

## 2022-08-16 DIAGNOSIS — S72142D Displaced intertrochanteric fracture of left femur, subsequent encounter for closed fracture with routine healing: Secondary | ICD-10-CM | POA: Diagnosis not present

## 2022-08-27 DIAGNOSIS — I214 Non-ST elevation (NSTEMI) myocardial infarction: Secondary | ICD-10-CM | POA: Diagnosis not present

## 2022-08-27 DIAGNOSIS — E039 Hypothyroidism, unspecified: Secondary | ICD-10-CM | POA: Diagnosis not present

## 2022-08-27 DIAGNOSIS — D500 Iron deficiency anemia secondary to blood loss (chronic): Secondary | ICD-10-CM | POA: Diagnosis not present

## 2022-08-27 DIAGNOSIS — E119 Type 2 diabetes mellitus without complications: Secondary | ICD-10-CM | POA: Diagnosis not present

## 2022-08-27 DIAGNOSIS — I5021 Acute systolic (congestive) heart failure: Secondary | ICD-10-CM | POA: Diagnosis not present

## 2022-08-27 DIAGNOSIS — Z8701 Personal history of pneumonia (recurrent): Secondary | ICD-10-CM | POA: Diagnosis not present

## 2022-08-27 DIAGNOSIS — Z794 Long term (current) use of insulin: Secondary | ICD-10-CM | POA: Diagnosis not present

## 2022-08-27 DIAGNOSIS — Z9181 History of falling: Secondary | ICD-10-CM | POA: Diagnosis not present

## 2022-08-27 DIAGNOSIS — E785 Hyperlipidemia, unspecified: Secondary | ICD-10-CM | POA: Diagnosis not present

## 2022-08-27 DIAGNOSIS — I2510 Atherosclerotic heart disease of native coronary artery without angina pectoris: Secondary | ICD-10-CM | POA: Diagnosis not present

## 2022-08-27 DIAGNOSIS — Z79899 Other long term (current) drug therapy: Secondary | ICD-10-CM | POA: Diagnosis not present

## 2022-08-27 DIAGNOSIS — Z955 Presence of coronary angioplasty implant and graft: Secondary | ICD-10-CM | POA: Diagnosis not present

## 2022-08-27 DIAGNOSIS — I110 Hypertensive heart disease with heart failure: Secondary | ICD-10-CM | POA: Diagnosis not present

## 2022-08-27 DIAGNOSIS — Z7984 Long term (current) use of oral hypoglycemic drugs: Secondary | ICD-10-CM | POA: Diagnosis not present

## 2022-08-27 DIAGNOSIS — S72142D Displaced intertrochanteric fracture of left femur, subsequent encounter for closed fracture with routine healing: Secondary | ICD-10-CM | POA: Diagnosis not present

## 2022-08-27 DIAGNOSIS — L89312 Pressure ulcer of right buttock, stage 2: Secondary | ICD-10-CM | POA: Diagnosis not present

## 2022-08-29 DIAGNOSIS — G2581 Restless legs syndrome: Secondary | ICD-10-CM | POA: Diagnosis not present

## 2022-08-29 DIAGNOSIS — R5381 Other malaise: Secondary | ICD-10-CM | POA: Diagnosis not present

## 2022-08-29 DIAGNOSIS — N1832 Chronic kidney disease, stage 3b: Secondary | ICD-10-CM | POA: Diagnosis not present

## 2022-08-29 DIAGNOSIS — D649 Anemia, unspecified: Secondary | ICD-10-CM | POA: Diagnosis not present

## 2022-08-29 DIAGNOSIS — L89152 Pressure ulcer of sacral region, stage 2: Secondary | ICD-10-CM | POA: Diagnosis not present

## 2022-08-29 DIAGNOSIS — Z23 Encounter for immunization: Secondary | ICD-10-CM | POA: Diagnosis not present

## 2022-09-04 DIAGNOSIS — E119 Type 2 diabetes mellitus without complications: Secondary | ICD-10-CM | POA: Diagnosis not present

## 2022-09-04 DIAGNOSIS — Z8701 Personal history of pneumonia (recurrent): Secondary | ICD-10-CM | POA: Diagnosis not present

## 2022-09-04 DIAGNOSIS — D500 Iron deficiency anemia secondary to blood loss (chronic): Secondary | ICD-10-CM | POA: Diagnosis not present

## 2022-09-04 DIAGNOSIS — I2510 Atherosclerotic heart disease of native coronary artery without angina pectoris: Secondary | ICD-10-CM | POA: Diagnosis not present

## 2022-09-04 DIAGNOSIS — E785 Hyperlipidemia, unspecified: Secondary | ICD-10-CM | POA: Diagnosis not present

## 2022-09-04 DIAGNOSIS — Z9181 History of falling: Secondary | ICD-10-CM | POA: Diagnosis not present

## 2022-09-04 DIAGNOSIS — I5021 Acute systolic (congestive) heart failure: Secondary | ICD-10-CM | POA: Diagnosis not present

## 2022-09-04 DIAGNOSIS — L89312 Pressure ulcer of right buttock, stage 2: Secondary | ICD-10-CM | POA: Diagnosis not present

## 2022-09-04 DIAGNOSIS — I214 Non-ST elevation (NSTEMI) myocardial infarction: Secondary | ICD-10-CM | POA: Diagnosis not present

## 2022-09-04 DIAGNOSIS — D649 Anemia, unspecified: Secondary | ICD-10-CM | POA: Diagnosis not present

## 2022-09-04 DIAGNOSIS — S72142D Displaced intertrochanteric fracture of left femur, subsequent encounter for closed fracture with routine healing: Secondary | ICD-10-CM | POA: Diagnosis not present

## 2022-09-04 DIAGNOSIS — E039 Hypothyroidism, unspecified: Secondary | ICD-10-CM | POA: Diagnosis not present

## 2022-09-04 DIAGNOSIS — Z955 Presence of coronary angioplasty implant and graft: Secondary | ICD-10-CM | POA: Diagnosis not present

## 2022-09-04 DIAGNOSIS — Z794 Long term (current) use of insulin: Secondary | ICD-10-CM | POA: Diagnosis not present

## 2022-09-04 DIAGNOSIS — I110 Hypertensive heart disease with heart failure: Secondary | ICD-10-CM | POA: Diagnosis not present

## 2022-09-04 DIAGNOSIS — Z7984 Long term (current) use of oral hypoglycemic drugs: Secondary | ICD-10-CM | POA: Diagnosis not present

## 2022-09-04 DIAGNOSIS — Z79899 Other long term (current) drug therapy: Secondary | ICD-10-CM | POA: Diagnosis not present

## 2022-09-10 ENCOUNTER — Encounter: Payer: Self-pay | Admitting: Family

## 2022-09-10 ENCOUNTER — Other Ambulatory Visit: Payer: Self-pay | Admitting: Family

## 2022-09-10 ENCOUNTER — Inpatient Hospital Stay: Payer: PPO | Admitting: Family

## 2022-09-10 ENCOUNTER — Inpatient Hospital Stay: Payer: PPO | Attending: Family

## 2022-09-10 VITALS — BP 137/54 | HR 58 | Temp 98.7°F | Resp 17 | Ht 64.0 in | Wt 173.8 lb

## 2022-09-10 DIAGNOSIS — E119 Type 2 diabetes mellitus without complications: Secondary | ICD-10-CM | POA: Diagnosis not present

## 2022-09-10 DIAGNOSIS — D509 Iron deficiency anemia, unspecified: Secondary | ICD-10-CM

## 2022-09-10 DIAGNOSIS — D649 Anemia, unspecified: Secondary | ICD-10-CM

## 2022-09-10 DIAGNOSIS — Z794 Long term (current) use of insulin: Secondary | ICD-10-CM | POA: Insufficient documentation

## 2022-09-10 DIAGNOSIS — D500 Iron deficiency anemia secondary to blood loss (chronic): Secondary | ICD-10-CM

## 2022-09-10 LAB — IRON AND IRON BINDING CAPACITY (CC-WL,HP ONLY)
Iron: 60 ug/dL (ref 28–170)
Saturation Ratios: 18 % (ref 10.4–31.8)
TIBC: 328 ug/dL (ref 250–450)
UIBC: 268 ug/dL (ref 148–442)

## 2022-09-10 LAB — CMP (CANCER CENTER ONLY)
ALT: 7 U/L (ref 0–44)
AST: 10 U/L — ABNORMAL LOW (ref 15–41)
Albumin: 4.3 g/dL (ref 3.5–5.0)
Alkaline Phosphatase: 80 U/L (ref 38–126)
Anion gap: 11 (ref 5–15)
BUN: 30 mg/dL — ABNORMAL HIGH (ref 8–23)
CO2: 28 mmol/L (ref 22–32)
Calcium: 10.2 mg/dL (ref 8.9–10.3)
Chloride: 101 mmol/L (ref 98–111)
Creatinine: 1.18 mg/dL — ABNORMAL HIGH (ref 0.44–1.00)
GFR, Estimated: 47 mL/min — ABNORMAL LOW (ref >60)
Glucose, Bld: 172 mg/dL — ABNORMAL HIGH (ref 70–99)
Potassium: 4.1 mmol/L (ref 3.5–5.1)
Sodium: 140 mmol/L (ref 135–145)
Total Bilirubin: 0.6 mg/dL (ref 0.3–1.2)
Total Protein: 7.1 g/dL (ref 6.5–8.1)

## 2022-09-10 LAB — CBC WITH DIFFERENTIAL (CANCER CENTER ONLY)
Abs Immature Granulocytes: 0.02 10*3/uL (ref 0.00–0.07)
Basophils Absolute: 0.1 10*3/uL (ref 0.0–0.1)
Basophils Relative: 1 %
Eosinophils Absolute: 0.1 10*3/uL (ref 0.0–0.5)
Eosinophils Relative: 2 %
HCT: 38.1 % (ref 36.0–46.0)
Hemoglobin: 11.8 g/dL — ABNORMAL LOW (ref 12.0–15.0)
Immature Granulocytes: 0 %
Lymphocytes Relative: 22 %
Lymphs Abs: 1.5 10*3/uL (ref 0.7–4.0)
MCH: 28.4 pg (ref 26.0–34.0)
MCHC: 31 g/dL (ref 30.0–36.0)
MCV: 91.6 fL (ref 80.0–100.0)
Monocytes Absolute: 0.5 10*3/uL (ref 0.1–1.0)
Monocytes Relative: 7 %
Neutro Abs: 4.7 10*3/uL (ref 1.7–7.7)
Neutrophils Relative %: 68 %
Platelet Count: 352 10*3/uL (ref 150–400)
RBC: 4.16 MIL/uL (ref 3.87–5.11)
RDW: 14.2 % (ref 11.5–15.5)
WBC Count: 6.9 10*3/uL (ref 4.0–10.5)
nRBC: 0 % (ref 0.0–0.2)

## 2022-09-10 LAB — RETICULOCYTES
Immature Retic Fract: 10.9 % (ref 2.3–15.9)
RBC.: 4.15 MIL/uL (ref 3.87–5.11)
Retic Count, Absolute: 67.6 10*3/uL (ref 19.0–186.0)
Retic Ct Pct: 1.6 % (ref 0.4–3.1)

## 2022-09-10 LAB — LACTATE DEHYDROGENASE: LDH: 158 U/L (ref 98–192)

## 2022-09-10 LAB — SAMPLE TO BLOOD BANK

## 2022-09-10 LAB — FERRITIN: Ferritin: 171 ng/mL (ref 11–307)

## 2022-09-10 LAB — SAVE SMEAR(SSMR), FOR PROVIDER SLIDE REVIEW

## 2022-09-10 NOTE — Progress Notes (Unsigned)
Hematology/Oncology Consultation   Name: Danielle Murray      MRN: [id number]    Location: Room/bed info not found  Date: 09/10/2022 Time:11:05 AM   REFERRING PHYSICIAN: [name], MD  REASON FOR CONSULT: Anemia unspecified   DIAGNOSIS:  Iron deficiency anemia   HISTORY OF PRESENT ILLNESS: Ms. Danielle Murray is a very pleasant 62 to caucasian female with new iron deficiency anemia. She was hospitalized in August with NSTEMI, CHF and pneumonia. She was found to be anemic during admission and was given 1 unit of blood.  She is now home and taking an oral iron supplement once a day. Hgb has improved to 11.8, MCV 91, platelets 352 and WBC count 6.9.  She states that she occasionally has bright red blood on her toilet tissue from hemorrhoids and straining.  No other blood loss noted. No bruising or petechiae.  No prior history of anemia. No known familial history of anemia.  She still notes some fatigue and occasional chills.   No recent colonoscopy.  No personal or known familial history of cancer.  She is diabetic and on insulin.  She takes synthroid for hypothyroidism.  No fever, n/v, cough, rash, dizziness, SOB, chest pain, palpitations, abdominal pain or changes in bowel or bladder habits.  No numbness or tingling in her extremities.  She notes swelling in her feet and ankles from sitting. This resolves over night.  She did fall back in July and had a left lower extremity intertochanteric femoral fracture repaired during admission. She is still in PT at her home.  No new falls or syncope reported. She uses a Corporate investment banker when ambulating for added support.  She has a pressure ulcer above her tailbone that has healed from a stage 3 to a stage 1. She has been seeing wound care.  Appetite and hydration are good. Weight is stable at 173 lbs.  No smoking, ETOH or recreational drug use.  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   ROS: All other 10 point review of  systems is negative.   PAST MEDICAL HISTORY:   Past Medical History:  Diagnosis Date   Arthritis    "fingers, all my joints" (09/03/2013)   Coronary artery disease    a. 08/2013: unstable angina s/p PTCA/DES to LAD, medical therapy for residual severe stenosis of a mid-distal left PDA branch of large dominant LCx, moderate RCA disease (consider PCI of L-PDA if she fails med rx).   DEPRESSION    Diverticulosis    Hyperlipidemia    Hypertension    HYPOTHYROIDISM    Preseptal cellulitis of right upper eyelid 03/10/2016   SVT (supraventricular tachycardia) ([place])    a. very brief transient SVT during 08/2013 admission overnight.   Type II diabetes mellitus ([place])    Dr Chalmers Cater    ALLERGIES: Allergies  Allergen Reactions   Penicillins     Diffuse joint pain @ age 52 in context of Scarlet Fever  Did it involve swelling of the face/tongue/throat, SOB, or low BP? No Did it involve sudden or severe rash/hives, skin peeling, or any reaction on the inside of your mouth or nose? No Did you need to seek medical attention at a hospital or doctor's office? No When did it last happen?  childhood     If all above answers are "NO", may proceed with cephalosporin use.        MEDICATIONS:  Current Outpatient Medications on File Prior to Visit  Medication Sig Dispense Refill  acetaminophen (TYLENOL) 325 MG tablet Take 2 tablets (650 mg total) by mouth every 4 (four) hours as needed for headache or mild pain.     aspirin 81 MG chewable tablet Chew 1 tablet (81 mg total) by mouth daily.     carvedilol (COREG) 6.25 MG tablet TAKE 1 TABLET BY MOUTH TWICE DAILY WITH A MEAL 180 tablet 1   Cholecalciferol (VITAMIN D3 PO) Take 1 tablet by mouth daily.     clopidogrel (PLAVIX) 75 MG tablet Take 1 tablet by mouth once daily 90 tablet 0   empagliflozin (JARDIANCE) 10 MG TABS tablet Take 1 tablet (10 mg total) by mouth daily. 90 tablet 3   enoxaparin (LOVENOX) 40 MG/0.4ML injection Inject 0.4 mLs (40 mg total)  into the skin daily for 23 days. 9.2 mL 0   furosemide (LASIX) 40 MG tablet Take 1 tablet (40 mg total) by mouth daily. 90 tablet 3   iron polysaccharides (NIFEREX) 150 MG capsule Take 1 capsule (150 mg total) by mouth daily.     isosorbide mononitrate (IMDUR) 30 MG 24 hr tablet Take 1 tablet by mouth once daily 90 tablet 3   levothyroxine (SYNTHROID, LEVOTHROID) 88 MCG tablet Take 88 mcg by mouth at bedtime.      losartan (COZAAR) 25 MG tablet Take 0.5 tablets (12.5 mg total) by mouth daily. 30 tablet 0   nitroGLYCERIN (NITROSTAT) 0.4 MG SL tablet Place 1 tablet (0.4 mg total) under the tongue every 5 (five) minutes as needed for chest pain (3 doses max). 25 tablet 3   NOVOLIN 70/30 KWIKPEN (70-30) 100 UNIT/ML KwikPen Inject 20 Units into the skin in the morning and at bedtime. Take 30 units in the morning and Take 50 units at bedtime     Probiotic Product (PROBIOTIC PO) Take 1 tablet by mouth daily.     rosuvastatin (CRESTOR) 5 MG tablet Take 1 tablet (5 mg total) by mouth daily. 90 tablet 3   senna-docusate (SENOKOT-S) 8.6-50 MG tablet Take 1 tablet by mouth 2 (two) times daily.     spironolactone (ALDACTONE) 25 MG tablet Take 0.5 tablets (12.5 mg total) by mouth daily. 45 tablet 3   No current facility-administered medications on file prior to visit.     PAST SURGICAL HISTORY Past Surgical History:  Procedure Laterality Date   ABDOMINAL HYSTERECTOMY  1989   no BSO; dysfunctional menses   CATARACT EXTRACTION W/ INTRAOCULAR LENS  IMPLANT, BILATERAL Bilateral 2014   CATARACT EXTRACTION W/PHACO Right 07/02/2013   CATARACT EXTRACTION W/PHACO Left 06/01/2013   COLONOSCOPY  2003   Tics   CORONARY ANGIOPLASTY WITH STENT PLACEMENT  09/03/2013   "1" (09/03/2013)   CORONARY LITHOTRIPSY N/A 06/25/2022   Procedure: CORONARY LITHOTRIPSY;  Surgeon: Belva Crome, MD;  Location: [facility];  Service: Cardiovascular;  Laterality: N/A;   CORONARY STENT INTERVENTION N/A 06/25/2022   Procedure:  CORONARY STENT INTERVENTION;  Surgeon: Belva Crome, MD;  Location: [facility];  Service: Cardiovascular;  Laterality: N/A;   FEMUR IM NAIL Left 06/02/2022   Procedure: INTRAMEDULLARY (IM) NAIL FEMORAL;  Surgeon: Willaim Sheng, MD;  Location: [facility];  Service: Orthopedics;  Laterality: Left;   INTRAVASCULAR ULTRASOUND/IVUS N/A 06/25/2022   Procedure: Intravascular Ultrasound/IVUS;  Surgeon: Belva Crome, MD;  Location: [facility];  Service: Cardiovascular;  Laterality: N/A;   LEFT HEART CATHETERIZATION WITH CORONARY ANGIOGRAM N/A 09/03/2013   Procedure: LEFT HEART CATHETERIZATION WITH CORONARY ANGIOGRAM;  Surgeon: Blane Ohara, MD;  Location: [facility];  Service: Cardiovascular;  Laterality: N/A;   RIGHT/LEFT HEART CATH AND CORONARY ANGIOGRAPHY N/A 06/21/2022   Procedure: RIGHT/LEFT HEART CATH AND CORONARY ANGIOGRAPHY;  Surgeon: Belva Crome, MD;  Location: [facility];  Service: Cardiovascular;  Laterality: N/A;   TOTAL KNEE ARTHROPLASTY Left 06/2005   Dr Gladstone Lighter    FAMILY HISTORY: Family History  Problem Relation Age of Onset   Asthma Mother    Hypertension Mother    Heart failure Mother    Hypertension Father    Stroke Father 59   Heart attack Father 50   Hypertension Sister    Hyperlipidemia Sister    Heart attack Sister        pre 34; Twin   Diabetes Sister        her TWIN   Hypertension Brother    Hyperlipidemia Brother    Coronary artery disease Brother        stents late 22s   Breast cancer Neg Hx     SOCIAL HISTORY:  reports that she has never smoked. She has never used smokeless tobacco. She reports current alcohol use. She reports that she does not use drugs.  PERFORMANCE STATUS: The patient's performance status is 1 - Symptomatic but completely ambulatory  PHYSICAL EXAM: Most Recent Vital Signs: There were no vitals taken for this visit. BP (!) 137/54 (BP Location: Left Arm, Patient Position: Sitting)   Pulse (!) 58   Temp  98.7 F (37.1 C) (Oral)   Resp 17   Ht '5\' 4"'$  (1.626 m)   Wt 173 lb 12.8 oz (78.8 kg)   SpO2 98%   BMI 29.83 kg/m   General Appearance:    Alert, cooperative, no distress, appears stated age  Head:    Normocephalic, without obvious abnormality, atraumatic  Eyes:    PERRL, conjunctiva/corneas clear, EOM's intact, fundi    benign, both eyes        Throat:   Lips, mucosa, and tongue normal; teeth and gums normal  Neck:   Supple, symmetrical, trachea midline, no adenopathy;    thyroid:  no enlargement/tenderness/nodules; no carotid   bruit or JVD  Back:     Symmetric, no curvature, ROM normal, no CVA tenderness  Lungs:     Clear to auscultation bilaterally, respirations unlabored  Chest Wall:    No tenderness or deformity   Heart:    Regular rate and rhythm, S1 and S2 normal, no murmur, rub   or gallop     Abdomen:     Soft, non-tender, bowel sounds active all four quadrants,    no masses, no organomegaly        Extremities:   Extremities normal, atraumatic, no cyanosis or edema  Pulses:   2+ and symmetric all extremities  Skin:   Skin color, texture, turgor normal, no rashes or lesions  Lymph nodes:   Cervical, supraclavicular, and axillary nodes normal  Neurologic:   CNII-XII intact, normal strength, sensation and reflexes    throughout    LABORATORY DATA:  Results for orders placed or performed in visit on 09/10/22 (from the past 48 hour(s))  CBC with Differential (Cancer Center Only)     Status: Abnormal   Collection Time: 09/10/22 10:31 AM  Result Value Ref Range   WBC Count 6.9 4.0 - 10.5 K/uL   RBC 4.16 3.87 - 5.11 MIL/uL   Hemoglobin 11.8 (L) 12.0 - 15.0 g/dL   HCT 38.1 36.0 - 46.0 %   MCV  91.6 80.0 - 100.0 fL   MCH 28.4 26.0 - 34.0 pg   MCHC 31.0 30.0 - 36.0 g/dL   RDW 14.2 11.5 - 15.5 %   Platelet Count 352 150 - 400 K/uL   nRBC 0.0 0.0 - 0.2 %   Neutrophils Relative % 68 %   Neutro Abs 4.7 1.7 - 7.7 K/uL   Lymphocytes Relative 22 %   Lymphs Abs 1.5 0.7 -  4.0 K/uL   Monocytes Relative 7 %   Monocytes Absolute 0.5 0.1 - 1.0 K/uL   Eosinophils Relative 2 %   Eosinophils Absolute 0.1 0.0 - 0.5 K/uL   Basophils Relative 1 %   Basophils Absolute 0.1 0.0 - 0.1 K/uL   Immature Granulocytes 0 %   Abs Immature Granulocytes 0.02 0.00 - 0.07 K/uL    Comment: Performed at [facility] at [facility], [address] [place], [place] [address]  Save Smear for Provider Slide Review     Status: None   Collection Time: 09/10/22 10:31 AM  Result Value Ref Range   Smear Review SMEAR STAINED AND AVAILABLE FOR REVIEW     Comment: Performed at [facility] at [facility], [address], [place], [place] [address]  Reticulocytes     Status: None   Collection Time: 09/10/22 10:32 AM  Result Value Ref Range   Retic Ct Pct 1.6 0.4 - 3.1 %   RBC. 4.15 3.87 - 5.11 MIL/uL   Retic Count, Absolute 67.6 19.0 - 186.0 K/uL   Immature Retic Fract 10.9 2.3 - 15.9 %    Comment: Performed at [facility] at [facility], [address] [place], [place] [address]      RADIOGRAPHY: No results found.     PATHOLOGY: None   ASSESSMENT/PLAN: Ms. Travaglini is a very pleasant 45 to caucasian female with new iron deficiency anemia.  She is doing well on oral iron daily. She will continue this for now.  We will see what her iron studies show and replace with IV iron if needed.  Follow-up in 3 months.   All questions were answered. The patient knows to call the clinic with any problems, questions or concerns. We can certainly see the patient much sooner if necessary.   Lottie Dawson, NP

## 2022-09-11 ENCOUNTER — Telehealth: Payer: Self-pay

## 2022-09-11 LAB — ERYTHROPOIETIN: Erythropoietin: 14.2 m[IU]/mL (ref 2.6–18.5)

## 2022-09-11 NOTE — Telephone Encounter (Signed)
See MyChart message

## 2022-09-12 DIAGNOSIS — E785 Hyperlipidemia, unspecified: Secondary | ICD-10-CM | POA: Diagnosis not present

## 2022-09-12 DIAGNOSIS — Z7984 Long term (current) use of oral hypoglycemic drugs: Secondary | ICD-10-CM | POA: Diagnosis not present

## 2022-09-12 DIAGNOSIS — I5021 Acute systolic (congestive) heart failure: Secondary | ICD-10-CM | POA: Diagnosis not present

## 2022-09-12 DIAGNOSIS — E119 Type 2 diabetes mellitus without complications: Secondary | ICD-10-CM | POA: Diagnosis not present

## 2022-09-12 DIAGNOSIS — Z955 Presence of coronary angioplasty implant and graft: Secondary | ICD-10-CM | POA: Diagnosis not present

## 2022-09-12 DIAGNOSIS — S72142D Displaced intertrochanteric fracture of left femur, subsequent encounter for closed fracture with routine healing: Secondary | ICD-10-CM | POA: Diagnosis not present

## 2022-09-12 DIAGNOSIS — Z8701 Personal history of pneumonia (recurrent): Secondary | ICD-10-CM | POA: Diagnosis not present

## 2022-09-12 DIAGNOSIS — L89312 Pressure ulcer of right buttock, stage 2: Secondary | ICD-10-CM | POA: Diagnosis not present

## 2022-09-12 DIAGNOSIS — I2510 Atherosclerotic heart disease of native coronary artery without angina pectoris: Secondary | ICD-10-CM | POA: Diagnosis not present

## 2022-09-12 DIAGNOSIS — E039 Hypothyroidism, unspecified: Secondary | ICD-10-CM | POA: Diagnosis not present

## 2022-09-12 DIAGNOSIS — Z794 Long term (current) use of insulin: Secondary | ICD-10-CM | POA: Diagnosis not present

## 2022-09-12 DIAGNOSIS — D500 Iron deficiency anemia secondary to blood loss (chronic): Secondary | ICD-10-CM | POA: Diagnosis not present

## 2022-09-12 DIAGNOSIS — I214 Non-ST elevation (NSTEMI) myocardial infarction: Secondary | ICD-10-CM | POA: Diagnosis not present

## 2022-09-12 DIAGNOSIS — Z9181 History of falling: Secondary | ICD-10-CM | POA: Diagnosis not present

## 2022-09-12 DIAGNOSIS — I110 Hypertensive heart disease with heart failure: Secondary | ICD-10-CM | POA: Diagnosis not present

## 2022-09-12 DIAGNOSIS — Z79899 Other long term (current) drug therapy: Secondary | ICD-10-CM | POA: Diagnosis not present

## 2022-09-14 DIAGNOSIS — E039 Hypothyroidism, unspecified: Secondary | ICD-10-CM | POA: Diagnosis not present

## 2022-09-14 DIAGNOSIS — E119 Type 2 diabetes mellitus without complications: Secondary | ICD-10-CM | POA: Diagnosis not present

## 2022-09-14 DIAGNOSIS — Z794 Long term (current) use of insulin: Secondary | ICD-10-CM | POA: Diagnosis not present

## 2022-09-14 DIAGNOSIS — Z8701 Personal history of pneumonia (recurrent): Secondary | ICD-10-CM | POA: Diagnosis not present

## 2022-09-14 DIAGNOSIS — S72142D Displaced intertrochanteric fracture of left femur, subsequent encounter for closed fracture with routine healing: Secondary | ICD-10-CM | POA: Diagnosis not present

## 2022-09-14 DIAGNOSIS — I5021 Acute systolic (congestive) heart failure: Secondary | ICD-10-CM | POA: Diagnosis not present

## 2022-09-14 DIAGNOSIS — D500 Iron deficiency anemia secondary to blood loss (chronic): Secondary | ICD-10-CM | POA: Diagnosis not present

## 2022-09-14 DIAGNOSIS — E785 Hyperlipidemia, unspecified: Secondary | ICD-10-CM | POA: Diagnosis not present

## 2022-09-14 DIAGNOSIS — I214 Non-ST elevation (NSTEMI) myocardial infarction: Secondary | ICD-10-CM | POA: Diagnosis not present

## 2022-09-14 DIAGNOSIS — Z79899 Other long term (current) drug therapy: Secondary | ICD-10-CM | POA: Diagnosis not present

## 2022-09-14 DIAGNOSIS — Z955 Presence of coronary angioplasty implant and graft: Secondary | ICD-10-CM | POA: Diagnosis not present

## 2022-09-14 DIAGNOSIS — L89312 Pressure ulcer of right buttock, stage 2: Secondary | ICD-10-CM | POA: Diagnosis not present

## 2022-09-14 DIAGNOSIS — I2510 Atherosclerotic heart disease of native coronary artery without angina pectoris: Secondary | ICD-10-CM | POA: Diagnosis not present

## 2022-09-14 DIAGNOSIS — I110 Hypertensive heart disease with heart failure: Secondary | ICD-10-CM | POA: Diagnosis not present

## 2022-09-14 DIAGNOSIS — Z9181 History of falling: Secondary | ICD-10-CM | POA: Diagnosis not present

## 2022-09-14 DIAGNOSIS — Z7984 Long term (current) use of oral hypoglycemic drugs: Secondary | ICD-10-CM | POA: Diagnosis not present

## 2022-09-17 ENCOUNTER — Encounter (HOSPITAL_BASED_OUTPATIENT_CLINIC_OR_DEPARTMENT_OTHER): Payer: PPO | Attending: General Surgery | Admitting: General Surgery

## 2022-09-17 DIAGNOSIS — I2510 Atherosclerotic heart disease of native coronary artery without angina pectoris: Secondary | ICD-10-CM | POA: Diagnosis not present

## 2022-09-17 DIAGNOSIS — I5043 Acute on chronic combined systolic (congestive) and diastolic (congestive) heart failure: Secondary | ICD-10-CM | POA: Insufficient documentation

## 2022-09-17 DIAGNOSIS — Z955 Presence of coronary angioplasty implant and graft: Secondary | ICD-10-CM | POA: Diagnosis not present

## 2022-09-17 DIAGNOSIS — I110 Hypertensive heart disease with heart failure: Secondary | ICD-10-CM | POA: Insufficient documentation

## 2022-09-17 DIAGNOSIS — M069 Rheumatoid arthritis, unspecified: Secondary | ICD-10-CM | POA: Insufficient documentation

## 2022-09-17 DIAGNOSIS — E039 Hypothyroidism, unspecified: Secondary | ICD-10-CM | POA: Diagnosis not present

## 2022-09-17 DIAGNOSIS — L89152 Pressure ulcer of sacral region, stage 2: Secondary | ICD-10-CM | POA: Diagnosis not present

## 2022-09-17 DIAGNOSIS — E1151 Type 2 diabetes mellitus with diabetic peripheral angiopathy without gangrene: Secondary | ICD-10-CM | POA: Insufficient documentation

## 2022-09-17 DIAGNOSIS — E11319 Type 2 diabetes mellitus with unspecified diabetic retinopathy without macular edema: Secondary | ICD-10-CM | POA: Insufficient documentation

## 2022-09-17 DIAGNOSIS — E11622 Type 2 diabetes mellitus with other skin ulcer: Secondary | ICD-10-CM | POA: Diagnosis not present

## 2022-09-17 NOTE — Progress Notes (Signed)
Danielle Murray, Danielle Murray ([id number]) [phone] [id number] Page 1 of 4 Visit Report for 09/17/2022 Abuse Risk Screen Details Patient Name: Date of Service: Danielle Murray, Danielle Murray. 09/17/2022 10:30 A M Medical Record Number: [id number] Patient Account Number: [id number] Date of Birth/Sex: Treating RN: 21-Aug-1941 (81 y.o. [name] Primary Care Trinten Boudoin: [name] Other Clinician: Referring Jessicia Napolitano: Treating Deshawn Skelley/Extender: Raylene Everts in Treatment: 0 Abuse Risk Screen Items Answer ABUSE RISK SCREEN: Has anyone close to you tried to hurt or harm you recentlyo No Do you feel uncomfortable with anyone in your familyo No Has anyone forced you do things that you didnt want to doo No Electronic Signature(s) Signed: 09/17/2022 4:40:13 PM By: Blanche East RN Entered By: Blanche East on 09/17/2022 10:47:20 -------------------------------------------------------------------------------- Activities of Daily Living Details Patient Name: Date of Service: Danielle Murray, Danielle Murray. 09/17/2022 10:30 A M Medical Record Number: [id number] Patient Account Number: [id number] Date of Birth/Sex: Treating RN: 10-09-1941 (81 y.o. [name] Primary Care Kelda Azad: [name] Other Clinician: Referring Shanece Cochrane: Treating Rosibel Giacobbe/Extender: Raylene Everts in Treatment: 0 Activities of Daily Living Items Answer Activities of Daily Living (Please select one for each item) Drive Automobile Not Able T Medications ake Completely Able Use T elephone Completely Able Care for Appearance Completely Able Use T oilet Completely Able Bath / Shower Completely Able Dress Self Completely Able Feed Self Completely Able Walk Need Assistance Get In / Out Bed Need Assistance Housework Need Assistance Prepare Meals Need Assistance Handle Money Need Assistance Shop for Self Need Assistance Electronic Signature(s) Signed: 09/17/2022 4:40:13 PM By:  Blanche East RN Entered By: Blanche East on 09/17/2022 10:48:15 Kenn File ([id number]) [id number] Page 2 of 4 -------------------------------------------------------------------------------- Education Screening Details Patient Name: Date of Service: Danielle Murray, Danielle Murray. 09/17/2022 10:30 A M Medical Record Number: [id number] Patient Account Number: [id number] Date of Birth/Sex: Treating RN: 04/30/1941 (81 y.o. [name] Primary Care Tennie Grussing: [name] Other Clinician: Referring Kaydin Karbowski: Treating Aanchal Cope/Extender: Raylene Everts in Treatment: 0 Primary Learner Assessed: Patient Learning Preferences/Education Level/Primary Language Learning Preference: Explanation Highest Education Level: College or Above Preferred Language: English Cognitive Barrier Language Barrier: No Translator Needed: No Memory Deficit: No Emotional Barrier: No Cultural/Religious Beliefs Affecting Medical Care: No Physical Barrier Impaired Vision: Yes Glasses, readers Impaired Hearing: No Decreased Hand dexterity: No Knowledge/Comprehension Knowledge Level: High Comprehension Level: High Ability to understand written instructions: High Ability to understand verbal instructions: High Motivation Anxiety Level: Calm Cooperation: Cooperative Education Importance: Acknowledges Need Interest in Health Problems: Asks Questions Perception: Coherent Willingness to Engage in Self-Management High Activities: Readiness to Engage in Self-Management High Activities: Electronic Signature(s) Signed: 09/17/2022 4:40:13 PM By: Blanche East RN Entered By: Blanche East on 09/17/2022 10:49:07 -------------------------------------------------------------------------------- Fall Risk Assessment Details Patient Name: Date of Service: Danielle Griffes NE M. 09/17/2022 10:30 A M Medical Record Number: [id number] Patient Account Number: [id number] Date of  Birth/Sex: Treating RN: 05-03-1941 (81 y.o. [name] Primary Care Christinamarie Tall: [name] Other Clinician: Referring Lesieli Bresee: Treating Zaxton Angerer/Extender: Raylene Everts in Treatment: 0 Fall Risk Assessment Items Have you had 2 or more falls in the last 12 monthso 0 No Danielle Murray, Danielle Murray ([id number]) [phone] [id number] Page 3 of 4 Have you had any fall that resulted in injury in the last 12 monthso 0 No FALLS RISK SCREEN History of falling - immediate or within 3 months 25 Yes Secondary diagnosis (Do you have 2 or more medical diagnoseso) 15 Yes Ambulatory aid  None/bed rest/wheelchair/nurse 0 No Crutches/cane/walker 15 Yes Furniture 0 No Intravenous therapy Access/Saline/Heparin Lock 0 No Gait/Transferring Normal/ bed rest/ wheelchair 0 No Weak (short steps with or without shuffle, stooped but able to lift head while walking, may seek 10 Yes support from furniture) Impaired (short steps with shuffle, may have difficulty arising from chair, head down, impaired 0 No balance) Mental Status Oriented to own ability 0 Yes Electronic Signature(s) Signed: 09/17/2022 4:40:13 PM By: Blanche East RN Entered By: Blanche East on 09/17/2022 10:49:55 -------------------------------------------------------------------------------- Foot Assessment Details Patient Name: Date of Service: Danielle Griffes NE M. 09/17/2022 10:30 A M Medical Record Number: [id number] Patient Account Number: [id number] Date of Birth/Sex: Treating RN: 10/01/1941 (81 y.o. [name] Primary Care Lorena Clearman: [name] Other Clinician: Referring Dylyn Mclaren: Treating Khylah Kendra/Extender: Raylene Everts in Treatment: 0 Foot Assessment Items Site Locations + = Sensation present, - = Sensation absent, C = Callus, U = Ulcer R = Redness, W = Warmth, M = Maceration, PU = Pre-ulcerative lesion F = Fissure, S = Swelling, D = Dryness Assessment Right:  Left: Other Deformity: No No Prior Foot Ulcer: No No Prior Amputation: No No Charcot Joint: No No Ambulatory Status: Ambulatory With Help Assistance Device: Danielle Murray, Danielle Murray ([id number]) [phone] [id number] Page 4 of 4 Gait: Steady Electronic Signature(s) Signed: 09/17/2022 4:40:13 PM By: Blanche East RN Entered By: Blanche East on 09/17/2022 10:50:36 -------------------------------------------------------------------------------- Nutrition Risk Screening Details Patient Name: Date of Service: Danielle Murray, Danielle Murray. 09/17/2022 10:30 A M Medical Record Number: [id number] Patient Account Number: [id number] Date of Birth/Sex: Treating RN: 1941/03/29 (81 y.o. [name], [place] Primary Care Lavanda Nevels: [name] Other Clinician: Referring Trevin Gartrell: Treating Gwynn Chalker/Extender: Raylene Everts in Treatment: 0 Height (in): 64 Weight (lbs): 172.4 Body Mass Index (BMI): 29.6 Nutrition Risk Screening Items Score Screening NUTRITION RISK SCREEN: I have an illness or condition that made me change the kind and/or amount of food I eat 0 No I eat fewer than two meals per day 0 No I eat few fruits and vegetables, or milk products 0 No I have three or more drinks of beer, liquor or wine almost every day 0 No I have tooth or mouth problems that make it hard for me to eat 0 No I don't always have enough money to buy the food I need 0 No I eat alone most of the time 0 No I take three or more different prescribed or over-the-counter drugs a day 1 Yes Without wanting to, I have lost or gained 10 pounds in the last six months 0 No I am not always physically able to shop, cook and/or feed myself 0 No Nutrition Protocols Good Risk Protocol 0 No interventions needed Moderate Risk Protocol High Risk Proctocol Risk Level: Good Risk Score: 1 Electronic Signature(s) Signed: 09/17/2022 4:40:13 PM By: Blanche East RN Entered By: Blanche East on  09/17/2022 10:50:24

## 2022-09-17 NOTE — Progress Notes (Signed)
ZELTA, ENFIELD ([id number]) [id number] Page 1 of 9 Visit Report for [facility] Allergy List Details Patient Name: Date of Service: Danielle Murray, Danielle Murray. Murray 10:30 A Murray Medical Record Number: [id number] Patient Account Number: [id number] Date of Birth/Sex: Treating RN: 01-Aug-1941 (81 y.o. [name] Primary Care Kamdon Reisig: [name] Other Clinician: Referring Melissa Tomaselli: Treating Chiyo Fay/Extender: Raylene Everts in Treatment: 0 Allergies Active Allergies penicillin Severity: Moderate Allergy Notes Electronic Signature(s) Signed: 09/17/2022 4:40:13 PM By: Blanche East RN Entered By: Blanche East on 09/17/2022 10:42:13 -------------------------------------------------------------------------------- Arrival Information Details Patient Name: Date of Service: Danielle Murray. Murray 10:30 A Murray Medical Record Number: [id number] Patient Account Number: [id number] Date of Birth/Sex: Treating RN: [birth year] (81 y.o. [name] Primary Care Keary Waterson: [name] Other Clinician: Referring Yonah Tangeman: Treating Harshith Pursell/Extender: Raylene Everts in Treatment: 0 Visit Information Patient Arrived: [name] Time: 10:53 Accompanied By: Husband Transfer Assistance: None Patient Identification Verified: Yes Secondary Verification Process Completed: Yes Patient Requires Transmission-Based Precautions: No Patient Has Alerts: No Electronic Signature(s) Signed: 09/17/2022 4:40:13 PM By: Blanche East RN Entered By: Blanche East on 09/17/2022 10:54:23 -------------------------------------------------------------------------------- Clinic Level of Care Assessment Details Patient Name: Date of Service: Danielle Murray, Danielle Murray. Murray 10:30 A Danielle Murray ([id number]) [id number] Page 2 of 9 Medical Record Number: [id number] Patient Account Number: [id number] Date of  Birth/Sex: Treating RN: 1941-11-05 (81 y.o. [name] Primary Care Kayti Poss: [name] Other Clinician: Referring Giovonnie Trettel: Treating Onalee Steinbach/Extender: Raylene Everts in Treatment: 0 Clinic Level of Care Assessment Items TOOL 1 Quantity Score X- 1 0 Use when EandM and Procedure is performed on INITIAL visit ASSESSMENTS - Nursing Assessment / Reassessment X- 1 20 General Physical Exam (combine w/ comprehensive assessment (listed just below) when performed on new pt. evals) X- 1 25 Comprehensive Assessment (HX, ROS, Risk Assessments, Wounds Hx, etc.) ASSESSMENTS - Wound and Skin Assessment / Reassessment '[]'$  - 0 Dermatologic / Skin Assessment (not related to wound area) ASSESSMENTS - Ostomy and/or Continence Assessment and Care X- 1 10 Incontinence Assessment and Management '[]'$  - 0 Ostomy Care Assessment and Management (repouching, etc.) PROCESS - Coordination of Care X - Simple Patient / Family Education for ongoing care 1 15 '[]'$  - 0 Complex (extensive) Patient / Family Education for ongoing care X- 1 10 Staff obtains Programmer, systems, Records, T Results / Process Orders est X- 1 10 Staff telephones HHA, Nursing Homes / Clarify orders / etc '[]'$  - 0 Routine Transfer to another Facility (non-emergent condition) '[]'$  - 0 Routine Hospital Admission (non-emergent condition) '[]'$  - 0 New Admissions / Biomedical engineer / Ordering NPWT Apligraf, etc. , '[]'$  - 0 Emergency Hospital Admission (emergent condition) PROCESS - Special Needs '[]'$  - 0 Pediatric / Minor Patient Management '[]'$  - 0 Isolation Patient Management '[]'$  - 0 Hearing / Language / Visual special needs '[]'$  - 0 Assessment of Community assistance (transportation, D/C planning, etc.) '[]'$  - 0 Additional assistance / Altered mentation '[]'$  - 0 Support Surface(s) Assessment (bed, cushion, seat, etc.) INTERVENTIONS - Miscellaneous '[]'$  - 0 External ear exam '[]'$  - 0 Patient Transfer (multiple staff /  Civil Service fast streamer / Similar devices) '[]'$  - 0 Simple Staple / Suture removal (25 or less) '[]'$  - 0 Complex Staple / Suture removal (26 or more) '[]'$  - 0 Hypo/Hyperglycemic Management (do not check if billed separately) '[]'$  - 0 Ankle / Brachial Index (ABI) - do not check if billed separately Has the patient been seen at the hospital  within the last three years: Yes Total Score: 90 Level Of Care: New/Established - Level 3 Electronic Signature(s) Signed: 09/17/2022 4:40:13 PM By: Blanche East RN Entered By: Blanche East on 09/17/2022 12:12:34 Kenn File ([id number]) [id number] Page 3 of 9 -------------------------------------------------------------------------------- Encounter Discharge Information Details Patient Name: Date of Service: Danielle Murray 10:30 A Murray Medical Record Number: [id number] Patient Account Number: [id number] Date of Birth/Sex: Treating RN: Oct 20, 1941 (81 y.o. [name] Primary Care Kadar Chance: [name] Other Clinician: Referring Jasiel Belisle: Treating Jillaine Waren/Extender: Raylene Everts in Treatment: 0 Encounter Discharge Information Items Post Procedure Vitals Discharge Condition: Stable Temperature (F): 98.4 Ambulatory Status: Walker Pulse (bpm): 72 Discharge Destination: Home Respiratory Rate (breaths/min): 16 Transportation: Private Auto Blood Pressure (mmHg): 164/64 Accompanied By: husband Schedule Follow-up Appointment: Yes Clinical Summary of Care: Electronic Signature(s) Signed: 09/17/2022 4:40:13 PM By: Blanche East RN Entered By: Blanche East on 09/17/2022 12:13:37 -------------------------------------------------------------------------------- Lower Extremity Assessment Details Patient Name: Date of Service: Danielle Murray, Danielle Murray. Murray 10:30 A Murray Medical Record Number: [id number] Patient Account Number: [id number] Date of Birth/Sex: Treating RN: 11/26/1940 (81 y.o. [name] Primary Care Lakeithia Rasor: [name] Other Clinician: Referring Greta Yung: Treating Primus Gritton/Extender: Raylene Everts in Treatment: 0 Electronic Signature(s) Signed: 09/17/2022 4:40:13 PM By: Blanche East RN Entered By: Blanche East on 09/17/2022 10:50:44 -------------------------------------------------------------------------------- Multi Wound Chart Details Patient Name: Date of Service: Danielle Murray. Murray 10:30 A Murray Medical Record Number: [id number] Patient Account Number: [id number] Date of Birth/Sex: Treating RN: [birth year] (81 y.o. F) Primary Care Aubriana Ravelo: [name] Other Clinician: Referring Damonta Cossey: Treating Kaniyah Lisby/Extender: Raylene Everts in Treatment: 0 Vital Signs Height(in): 64 Capillary Blood Glucose(mg/dl): 176 Weight(lbs): 172.4 Pulse(bpm): 72 Body Mass Index(BMI): 29.6 Blood Pressure(mmHg): 164/64 Temperature(F): 98.4 Respiratory Rate(breaths/min): [address] ([id number]) [id number] Page 4 of 9 [1:Photos:] [N/A:N/A] Sacrum N/A N/A Wound Location: Pressure Injury N/A N/A Wounding Event: Pressure Ulcer N/A N/A Primary Etiology: Cataracts, Anemia, Angina, Coronary N/A N/A Comorbid History: Artery Disease, Hypertension, Peripheral Arterial Disease, Type II Diabetes, Rheumatoid Arthritis, Neuropathy 06/03/2022 N/A N/A Date Acquired: 0 N/A N/A Weeks of Treatment: Open N/A N/A Wound Status: No N/A N/A Wound Recurrence: 1.6x0.5x0.1 N/A N/A Measurements L x W x D (cm) 0.628 N/A N/A A (cm) : rea 0.063 N/A N/A Volume (cm) : Category/Stage II N/A N/A Classification: Medium N/A N/A Exudate A mount: Serous N/A N/A Exudate Type: amber N/A N/A Exudate Color: Medium (34-66%) N/A N/A Granulation A mount: Pink N/A N/A Granulation Quality: Medium (34-66%) N/A N/A Necrotic A mount: Fat Layer (Subcutaneous Tissue): Yes N/A N/A Exposed  Structures: Fascia: No Tendon: No Muscle: No Joint: No Debridement ([id number]) - N/A N/A Debridement: Selective/Open Wound Pre-procedure Verification/Time Out 11:19 N/A N/A Taken: Lidocaine 5% topical ointment N/A N/A Pain Control: Slough N/A N/A Tissue Debrided: Non-Viable Tissue N/A N/A Level: 0.8 N/A N/A Debridement A (sq cm): rea Curette N/A N/A Instrument: Minimum N/A N/A Bleeding: Pressure N/A N/A Hemostasis A chieved: 0 N/A N/A Procedural Pain: 0 N/A N/A Post Procedural Pain: Procedure was tolerated well N/A N/A Debridement Treatment Response: 1.6x0.5x0.1 N/A N/A Post Debridement Measurements L x W x D (cm) 0.063 N/A N/A Post Debridement Volume: (cm) Category/Stage II N/A N/A Post Debridement Stage: Excoriation: Yes N/A N/A Periwound Skin Texture: No Abnormalities Noted N/A N/A Periwound Skin Moisture: Debridement N/A N/A Procedures Performed: Treatment Notes Electronic Signature(s) Signed: 09/17/2022 11:54:49 AM By: Fredirick Maudlin MD FACS Entered By: Fredirick Maudlin on  09/17/2022 11:54:49 -------------------------------------------------------------------------------- Multi-Disciplinary Care Plan Details Patient Name: Date of Service: Danielle Murray, DEHAAS. Murray 10:30 A Murray Medical Record Number: [id number] Patient Account Number: [id number] Date of Birth/Sex: Treating RN: [birth year] (81 y.o. [address] [place] ([id number]) [id number] Page 5 of 9 Primary Care Lars Jeziorski: [name] Other Clinician: Referring Brook Geraci: Treating Flavius Repsher/Extender: Raylene Everts in Treatment: 0 Active Inactive Nutrition Nursing Diagnoses: Potential for alteratiion in Nutrition/Potential for imbalanced nutrition Goals: Patient/caregiver agrees to and verbalizes understanding of need to use nutritional supplements and/or vitamins as prescribed Date Initiated: Murray Target Resolution Date:  10/17/2022 Goal Status: Active Interventions: Assess patient nutrition upon admission and as needed per policy Provide education on nutrition Treatment Activities: Dietary management education, guidance and counseling : Murray Notes: Orientation to the Wound Care Program Nursing Diagnoses: Knowledge deficit related to the wound healing center program Goals: Patient/caregiver will verbalize understanding of the [facility] Date Initiated: Murray Target Resolution Date: 10/16/2022 Goal Status: Active Interventions: Provide education on orientation to the wound center Notes: Pressure Nursing Diagnoses: Knowledge deficit related to causes and risk factors for pressure ulcer development Goals: Patient will remain free of pressure ulcers Date Initiated: Murray Target Resolution Date: 10/15/2022 Goal Status: Active Patient/caregiver will verbalize understanding of pressure ulcer management Date Initiated: Murray Target Resolution Date: 10/15/2022 Goal Status: Active Interventions: Assess: immobility, friction, shearing, incontinence upon admission and as needed Assess offloading mechanisms upon admission and as needed Assess potential for pressure ulcer upon admission and as needed Treatment Activities: Patient referred for pressure reduction/relief devices : Murray Patient referred for seating evaluation to ensure proper offloading : Murray Notes: Wound/Skin Impairment Nursing Diagnoses: Impaired tissue integrity Goals: Ulcer/skin breakdown will have a volume reduction of 30% by week 4 Date Initiated: Murray Target Resolution Date: 10/15/2022 Goal Status: Active Interventions: Assess ulceration(s) every visit Danielle Murray, LOH ([id number]) [id number] Page 6 of 9 Provide education on ulcer and skin care Treatment Activities: Skin care regimen initiated : Murray Notes: Electronic Signature(s) Signed: 09/17/2022  4:40:13 PM By: Blanche East RN Entered By: Blanche East on 09/17/2022 11:35:57 -------------------------------------------------------------------------------- Pain Assessment Details Patient Name: Date of Service: Danielle Murray. Murray 10:30 A Murray Medical Record Number: [id number] Patient Account Number: [id number] Date of Birth/Sex: Treating RN: 01-Oct-1941 (81 y.o. [name] Primary Care Windell Musson: [name] Other Clinician: Referring Deneisha Dade: Treating Zakya Halabi/Extender: Raylene Everts in Treatment: 0 Active Problems Location of Pain Severity and Description of Pain Patient Has Paino No Site Locations Pain Management and Medication Current Pain Management: Electronic Signature(s) Signed: 09/17/2022 4:40:13 PM By: Blanche East RN Entered By: Blanche East on 09/17/2022 11:13:44 -------------------------------------------------------------------------------- Patient/Caregiver Education Details Patient Name: Date of Service: Danielle Ludwig. 11/6/2023andnbsp10:30 A Murray Medical Record Number: [id number] Patient Account Number: [id number] Date of Birth/Gender: Treating RN: November 03, 1941 (81 y.o. [name] Primary Care Physician: [name] Other Clinician: Referring Physician: Treating Physician/Extender: Raylene Everts in Treatment: 0 Danielle Murray, Danielle Murray ([id number]) [id number] Page 7 of 9 Education Assessment Education Provided To: Patient Education Topics Provided Nutrition: Handouts: Elevated Blood Sugars: How Do They Affect Wound Healing, Nutrition Methods: Explain/Verbal Responses: Reinforcements needed, State content correctly [place]: o Handouts: Welcome T [facility] o Methods: Explain/Verbal Responses: Reinforcements needed, State content correctly Wound/Skin Impairment: Handouts: Caring for Your Ulcer, Skin Care Do's and Dont's Methods:  Explain/Verbal Responses: Reinforcements needed, State content correctly Electronic Signature(s) Signed: 09/17/2022 4:40:13 PM By:  Zochol, Jamie RN Entered By: Blanche East on 09/17/2022 11:36:36 -------------------------------------------------------------------------------- Wound Assessment Details Patient Name: Date of Service: Danielle Murray, STOPKA. Murray 10:30 A Murray Medical Record Number: [id number] Patient Account Number: [id number] Date of Birth/Sex: Treating RN: 05-Dec-1940 (81 y.o. [name], Jamie Primary Care Lacie Landry: [name] Other Clinician: Referring Hendrixx Severin: Treating Aubrii Sharpless/Extender: Raylene Everts in Treatment: 0 Wound Status Wound Number: 1 Primary Pressure Ulcer Etiology: Wound Location: Sacrum Wound Open Wounding Event: Pressure Injury Status: Date Acquired: 06/03/2022 Comorbid Cataracts, Anemia, Angina, Coronary Artery Disease, Weeks Of Treatment: 0 History: Hypertension, Peripheral Arterial Disease, Type II Diabetes, Clustered Wound: No Rheumatoid Arthritis, Neuropathy Photos Wound Measurements Length: (cm) 1.6 Width: (cm) 0.5 Depth: (cm) 0.1 Area: (cm) 0.628 Volume: (cm) 0.063 Danielle Murray, Danielle Murray ([id number]) % Reduction in Area: % Reduction in Volume: Tunneling: No Undermining: No [id number] Page 8 of 9 Wound Description Classification: Category/Stage II Exudate Amount: Medium Exudate Type: Serous Exudate Color: amber Foul Odor After Cleansing: No Slough/Fibrino Yes Wound Bed Granulation Amount: Medium (34-66%) Exposed Structure Granulation Quality: Pink Fascia Exposed: No Necrotic Amount: Medium (34-66%) Fat Layer (Subcutaneous Tissue) Exposed: Yes Necrotic Quality: Adherent Slough Tendon Exposed: No Muscle Exposed: No Joint Exposed: No Periwound Skin Texture Texture Color No Abnormalities Noted: No No Abnormalities Noted: No Excoriation: Yes Moisture No Abnormalities Noted:  Yes Treatment Notes Wound #1 (Sacrum) Cleanser Soap and Water Discharge Instruction: May shower and wash wound with dial antibacterial soap and water prior to dressing change. Anasept Antimicrobial Skin and Wound Cleanser, 8 (oz) Discharge Instruction: Cleanse the wound with Anasept cleanser prior to applying a clean dressing using gauze sponges, not tissue or cotton balls. Peri-Wound Care Zinc Oxide Ointment 30g tube Discharge Instruction: Apply Zinc Oxide to periwound with each dressing change Topical Primary Dressing MediHoney Gel, tube 1.5 (oz) Discharge Instruction: Apply to wound bed as instructed Secondary Dressing ALLEVYN Gentle Border, 5x5 (in/in) Discharge Instruction: Apply over primary dressing as directed. Secured With Compression Wrap Compression Stockings Environmental education officer) Signed: 09/17/2022 4:40:13 PM By: Blanche East RN Entered By: Blanche East on 09/17/2022 11:13:29 -------------------------------------------------------------------------------- Vitals Details Patient Name: Date of Service: Danielle Murray. Murray 10:30 A Murray Medical Record Number: [id number] Patient Account Number: [id number] Date of Birth/Sex: Treating RN: 02/24/1941 (81 y.o. [name] Primary Care Sandralee Tarkington: [name] Other Clinician: Referring Asad Keeven: Treating Everlina Gotts/Extender: Raylene Everts in Treatment: 0 Danielle Murray, Danielle Murray ([id number]) [id number] Page 9 of 9 Vital Signs Time Taken: 10:37 Temperature (F): 98.4 Height (in): 64 Pulse (bpm): 72 Source: Stated Respiratory Rate (breaths/min): 16 Weight (lbs): 172.4 Blood Pressure (mmHg): 164/64 Source: Stated Capillary Blood Glucose (mg/dl): 176 Body Mass Index (BMI): 29.6 Reference Range: 80 - 120 mg / dl Electronic Signature(s) Signed: 09/17/2022 4:40:13 PM By: Blanche East RN Entered By: Blanche East on 09/17/2022 10:42:06

## 2022-09-17 NOTE — Progress Notes (Addendum)
[name], [name] ([id number]) [id number] Page 1 of 9 Visit Report for 09/17/2022 Chief Complaint Document Details Patient Name: Date of Service: Danielle Murray, Danielle Murray. 09/17/2022 10:30 A M Medical Record Number: [id number] Patient Account Number: [id number] Date of Birth/Sex: Treating RN: Danielle Murray-[birth year] (81 y.o. F) Primary Care Provider: Deland Murray Other Clinician: Referring Provider: Treating Provider/Extender: Danielle Murray in Treatment: 0 Information Obtained from: Patient Chief Complaint Patient is at the clinic for treatment of an open pressure ulcer Electronic Signature(s) Signed: 09/17/2022 11:55:30 AM By: Danielle Maudlin MD FACS Entered By: Danielle Murray on 09/17/2022 11:55:30 -------------------------------------------------------------------------------- Debridement Details Patient Name: Date of Service: Danielle Griffes NE M. 09/17/2022 10:30 A M Medical Record Number: [id number] Patient Account Number: [id number] Date of Birth/Sex: Treating RN: Danielle Murray, Danielle Murray (81 y.o. Danielle Murray Primary Care Provider: Deland Murray Other Clinician: Referring Provider: Treating Provider/Extender: Danielle Murray in Treatment: 0 Debridement Performed for Assessment: Wound #1 Sacrum Performed By: Physician Danielle Maudlin, MD Debridement Type: Debridement Level of Consciousness (Pre-procedure): Awake and Alert Pre-procedure Verification/Time Out Yes - 11:19 Taken: Start Time: 11:20 Pain Control: Lidocaine 5% topical ointment T Area Debrided (L x W): otal 1.6 (cm) x 0.5 (cm) = 0.8 (cm) Tissue and other material debrided: Non-Viable, Slough, Slough Level: Non-Viable Tissue Debridement Description: Selective/Open Wound Instrument: Curette Bleeding: Minimum Hemostasis Achieved: Pressure Response to Treatment: Procedure was tolerated well Level of Consciousness (Post- Awake and Alert procedure): Post Debridement  Measurements of Total Wound Length: (cm) 1.6 Stage: Category/Stage II Width: (cm) 0.5 Depth: (cm) 0.1 Volume: (cm) 0.063 Character of Wound/Ulcer Post Debridement: Requires Further Debridement Post Procedure Diagnosis Same as Pre-procedure Notes Scribed for Dr. Celine Ahr by Danielle East, RN Electronic Signature(s) Signed: 09/18/2022 7:33:06 AM By: Danielle Maudlin MD FACS Signed: 01/07/2023 4:57:13 PM By: Danielle East RN Previous Signature: 09/17/2022 12:07:14 PM Version By: Danielle Maudlin MD FACS Previous Signature: 09/17/2022 4:40:13 PM Version By: Danielle East RN Entered By: Danielle Murray on 09/17/2022 16:42:18 [name], Danielle Murray ([id number]) [id number] Page 2 of 9 -------------------------------------------------------------------------------- HPI Details Patient Name: Date of Service: [name], [name]. 09/17/2022 10:30 A M Medical Record Number: [id number] Patient Account Number: [id number] Date of Birth/Sex: Treating RN: Danielle Murray (81 y.o. F) Primary Care Provider: Deland Murray Other Clinician: Referring Provider: Treating Provider/Extender: Danielle Murray in Treatment: 0 History of Present Illness HPI Description: ADMISSION 09/17/2022 This is an 81 year old poorly controlled type II diabetic (last hemoglobin A1c 7.2) who fell and had an intertrochanteric fracture of her left hip in July. She developed a pressure ulcer on her sacrum while in the hospital. She was discharged to rehab and then subsequently readmitted to the hospital with what sounds like a CHF exacerbation and pneumonia. Wound ostomy nurses were consulted and they recommended Medihoney application. They have been doing this ever since her discharge apparently, and the wound is now down to just a couple of millimeters. Due to the fact that it has been present since July, however, she was referred to the wound care center for further evaluation and management. The  patient says that she does sleep on a regular mattress and is aware of the need to stay off of her sacral area is much as possible, but due to the rod in her hip, she cannot rotate from side to side particularly well. She is in a recliner much of the day and apparently has some sort of honeycomb-type foam that she sits on. She reports what sounds like better than average protein  intake, including a Premier protein beverage daily. On inspection, she has a very small wound on her sacrum, measuring just a couple of millimeters in all dimensions. There is some slough and senescent periwound skin. There is some moisture-related breakdown to the periwound skin. No concern for infection. Electronic Signature(s) Signed: 09/17/2022 12:01:09 PM By: Danielle Maudlin MD FACS Entered By: Danielle Murray on 09/17/2022 12:01:09 -------------------------------------------------------------------------------- Physical Exam Details Patient Name: Date of Service: Danielle Griffes NE M. 09/17/2022 10:30 A M Medical Record Number: [id number] Patient Account Number: [id number] Date of Birth/Sex: Treating RN: Danielle Murray/[birth year] (81 y.o. F) Primary Care Provider: Deland Murray Other Clinician: Referring Provider: Treating Provider/Extender: Danielle Murray in Treatment: 0 Constitutional Hypertensive, asymptomatic. . . . Marland Kitchen Respiratory Normal work of breathing on room air.. Notes 09/17/2022: She has a very small wound on her sacrum, measuring just a couple of millimeters in all dimensions. There is some slough and senescent periwound skin. There is some moisture-related breakdown to the periwound skin. No concern for infection. Electronic Signature(s) Signed: 09/17/2022 12:02:09 PM By: Danielle Maudlin MD FACS Entered By: Danielle Murray on 09/17/2022 12:02:09 -------------------------------------------------------------------------------- Physician Orders Details Patient Name: Date of Service: Danielle Griffes NE M. 09/17/2022 10:30 A M Medical Record Number: [id number] Patient Account Number: [id number] Date of Birth/Sex: Treating RN: [birth year]/Danielle Murray (81 y.o. Danielle Murray Primary Care Provider: Deland Murray Other Clinician: Referring Provider: Treating Provider/Extender: Danielle Murray in Treatment: 0 Verbal / Phone Orders: No Diagnosis Coding VANITY, DAIGLER ([id number]) [id number] Page 3 of 9 ICD-10 Coding Code Description L89.152 Pressure ulcer of sacral region, stage 2 I50.43 Acute on chronic combined systolic (congestive) and diastolic (congestive) heart failure E11.65 Type 2 diabetes mellitus with hyperglycemia E11.622 Type 2 diabetes mellitus with other skin ulcer Follow-up Appointments ppointment in 1 week. - Dr. Celine Ahr rm 4 Return A Monday 09/24/22 at 2:30 PM Anesthetic Wound #1 Sacrum (In clinic) Topical Lidocaine 5% applied to wound bed - in clinic, prior to debridement Off-Loading Wound #1 Sacrum Turn and reposition every 2 hours - place egg carton wedge pillow to off-set weight while sitting or lying flat on back while in the bed Wound Treatment Wound #1 - Sacrum Cleanser: Soap and Water 1 x Per Day/30 Days Discharge Instructions: May shower and wash wound with dial antibacterial soap and water prior to dressing change. Cleanser: Anasept Antimicrobial Skin and Wound Cleanser, 8 (oz) 1 x Per Day/30 Days Discharge Instructions: Cleanse the wound with Anasept cleanser prior to applying a clean dressing using gauze sponges, not tissue or cotton balls. Peri-Wound Care: Zinc Oxide Ointment 30g tube 1 x Per Day/30 Days Discharge Instructions: Apply Zinc Oxide to periwound with each dressing change Prim Dressing: MediHoney Gel, tube 1.5 (oz) 1 x Per Day/30 Days ary Discharge Instructions: Apply to wound bed as instructed Secondary Dressing: ALLEVYN Gentle Border, 5x5 (in/in) (DME) (Generic) 1 x Per Day/30 Days Discharge  Instructions: Apply over primary dressing as directed. Electronic Signature(s) Signed: 09/17/2022 1:35:02 PM By: Danielle Maudlin MD FACS Signed: 09/17/2022 4:40:13 PM By: Danielle East RN Previous Signature: 09/17/2022 12:07:14 PM Version By: Danielle Maudlin MD FACS Entered By: Danielle Murray on 09/17/2022 12:28:15 -------------------------------------------------------------------------------- Problem List Details Patient Name: Date of Service: Danielle Griffes NE M. 09/17/2022 10:30 A M Medical Record Number: [id number] Patient Account Number: [id number] Date of Birth/Sex: Treating RN: 10-[birth year]-Danielle Murray (80 y.o. F) Primary Care Provider: Deland Murray Other Clinician: Referring Provider: Treating Provider/Extender: Danielle Murray in Treatment: 0 Active Problems ICD-10  Encounter Code Description Active Date MDM Diagnosis L89.152 Pressure ulcer of sacral region, stage 2 09/17/2022 No Yes I50.43 Acute on chronic combined systolic (congestive) and diastolic (congestive) [id number] No Yes heart failure E11.65 Type 2 diabetes mellitus with hyperglycemia 09/17/2022 No Yes TEQUISHA, MCMONAGLE ([id number]) [id number] Page 4 of 9 E11.622 Type 2 diabetes mellitus with other skin ulcer 09/17/2022 No Yes Inactive Problems Resolved Problems Electronic Signature(s) Signed: 09/17/2022 11:54:00 AM By: Danielle Maudlin MD FACS Entered By: Danielle Murray on 09/17/2022 11:54:00 -------------------------------------------------------------------------------- Progress Note Details Patient Name: Date of Service: Danielle Griffes NE M. 09/17/2022 10:30 A M Medical Record Number: [id number] Patient Account Number: [id number] Date of Birth/Sex: Treating RN: [birth year], Danielle Murray (81 y.o. F) Primary Care Provider: Deland Murray Other Clinician: Referring Provider: Treating Provider/Extender: Danielle Murray in Treatment: 0 Subjective Chief Complaint Information  obtained from Patient Patient is at the clinic for treatment of an open pressure ulcer History of Present Illness (HPI) ADMISSION 09/17/2022 This is an 81 year old poorly controlled type II diabetic (last hemoglobin A1c 7.2) who fell and had an intertrochanteric fracture of her left hip in July. She developed a pressure ulcer on her sacrum while in the hospital. She was discharged to rehab and then subsequently readmitted to the hospital with what sounds like a CHF exacerbation and pneumonia. Wound ostomy nurses were consulted and they recommended Medihoney application. They have been doing this ever since her discharge apparently, and the wound is now down to just a couple of millimeters. Due to the fact that it has been present since July, however, she was referred to the wound care center for further evaluation and management. The patient says that she does sleep on a regular mattress and is aware of the need to stay off of her sacral area is much as possible, but due to the rod in her hip, she cannot rotate from side to side particularly well. She is in a recliner much of the day and apparently has some sort of honeycomb-type foam that she sits on. She reports what sounds like better than average protein intake, including a Premier protein beverage daily. On inspection, she has a very small wound on her sacrum, measuring just a couple of millimeters in all dimensions. There is some slough and senescent periwound skin. There is some moisture-related breakdown to the periwound skin. No concern for infection. Patient History Information obtained from Patient. Allergies penicillin (Severity: Moderate) Family History Diabetes - Siblings, Heart Disease - Mother,Father,Siblings, Hypertension - Mother,Father,Siblings, Stroke - Father. Social History Never smoker, Marital Status - Married, Alcohol Use - Never, Drug Use - No History, Caffeine Use - Daily - coffee, tea. Medical History Eyes Patient  has history of Cataracts Ear/Nose/Mouth/Throat Denies history of Chronic sinus problems/congestion, Middle ear problems Hematologic/Lymphatic Patient has history of Anemia Denies history of Hemophilia, Human Immunodeficiency Virus, Lymphedema, Sickle Cell Disease Respiratory Denies history of Aspiration, Asthma, Chronic Obstructive Pulmonary Disease (COPD), Pneumothorax, Sleep Apnea, Tuberculosis Cardiovascular Patient has history of Angina, Coronary Artery Disease, Hypertension, Peripheral Arterial Disease Gastrointestinal Denies history of Cirrhosis , Colitis, Crohnoos, Hepatitis A, Hepatitis B, Hepatitis C Endocrine Patient has history of Type II Diabetes Immunological Denies history of Lupus Erythematosus, Raynaudoos, Scleroderma Integumentary (Skin) Denies history of History of Burn Musculoskeletal Patient has history of Rheumatoid Arthritis Danielle, Murray ([id number]) [id number] Page 5 of 9 Neurologic Patient has history of Neuropathy Oncologic Denies history of Received Chemotherapy, Received Radiation Psychiatric Denies history of Anorexia/bulimia, Confinement Anxiety Patient is treated with Insulin, Oral Agents.  Blood sugar is tested. Hospitalization/Surgery History - 06/25/22- coronary stent intervention. - 8/23- R/L heart cath and coronary angiography. - 05/2013- Femur IM nailing (left). - 08/2013- Left heart cath with coronary angiogram. - 06/2013- cataract extraction. - 06/2005- T knee arthroplasty. - 1989- abd hysterectomy. otal Medical A Surgical History Notes nd Eyes diabetic retinopathy bilateral eyes Gastrointestinal Diverticulosis Endocrine Hypothyroidism Psychiatric Depression Review of Systems (ROS) Constitutional Symptoms (General Health) Denies complaints or symptoms of Fatigue, Fever, Chills, Marked Weight Change. Eyes Complains or has symptoms of Glasses / Contacts - glasses. Denies complaints or symptoms of Dry Eyes,  Vision Changes. Ear/Nose/Mouth/Throat Denies complaints or symptoms of Chronic sinus problems or rhinitis. Respiratory Denies complaints or symptoms of Chronic or frequent coughs, Shortness of Breath. Cardiovascular Denies complaints or symptoms of Chest pain. Gastrointestinal Denies complaints or symptoms of Frequent diarrhea, Nausea, Vomiting. Endocrine Denies complaints or symptoms of Heat/cold intolerance. Musculoskeletal Denies complaints or symptoms of Muscle Pain, Muscle Weakness. Neurologic Complains or has symptoms of Numbness/parasthesias - Bila legs. Psychiatric Denies complaints or symptoms of Claustrophobia. Objective Constitutional Hypertensive, asymptomatic. Vitals Time Taken: 10:37 AM, Height: 64 in, Source: Stated, Weight: 172.4 lbs, Source: Stated, BMI: 29.6, Temperature: 98.4 F, Pulse: 72 bpm, Respiratory Rate: 16 breaths/min, Blood Pressure: 164/64 mmHg, Capillary Blood Glucose: 176 mg/dl. Respiratory Normal work of breathing on room air.. General Notes: 09/17/2022: She has a very small wound on her sacrum, measuring just a couple of millimeters in all dimensions. There is some slough and senescent periwound skin. There is some moisture-related breakdown to the periwound skin. No concern for infection. Integumentary (Hair, Skin) Wound #1 status is Open. Original cause of wound was Pressure Injury. The date acquired was: 06/03/2022. The wound is located on the Sacrum. The wound measures 1.6cm length x 0.5cm width x 0.1cm depth; 0.628cm^2 area and 0.063cm^3 volume. There is Fat Layer (Subcutaneous Tissue) exposed. There is no tunneling or undermining noted. There is a medium amount of serous drainage noted. There is medium (34-66%) pink granulation within the wound bed. There is a medium (34-66%) amount of necrotic tissue within the wound bed including Adherent Slough. The periwound skin appearance had no abnormalities noted for moisture. The periwound skin appearance  exhibited: Excoriation. Assessment Active Problems ICD-10 Pressure ulcer of sacral region, stage 2 Acute on chronic combined systolic (congestive) and diastolic (congestive) heart failure Type 2 diabetes mellitus with hyperglycemia Type 2 diabetes mellitus with other skin ulcer Ching, Lawrenz Danielle Murray ([id number]) [id number] Page 6 of 9 Procedures Wound #1 Pre-procedure diagnosis of Wound #1 is a Pressure Ulcer located on the Sacrum . There was a Selective/Open Wound Non-Viable Tissue Debridement [id number]) with a total area of 0.8 sq cm performed by Danielle Maudlin, MD. With the following instrument(s): Curette to remove Non-Viable tissue/material. Material removed includes [facility] after achieving pain control using Lidocaine 5% topical ointment. A time out was conducted at 11:19, prior to the start of the procedure. A Minimum amount of bleeding was controlled with Pressure. The procedure was tolerated well with a pain level of 0 throughout and a pain level of 0 following the procedure. Post Debridement Measurements: 1.6cm length x 0.5cm width x 0.1cm depth; 0.063cm^3 volume. Post debridement Stage noted as Category/Stage II. Character of Wound/Ulcer Post Debridement requires further debridement. Post procedure Diagnosis Wound #1: Same as Pre-Procedure General Notes: Scribed for Dr. Celine Ahr by Danielle East, RN. Plan Follow-up Appointments: Return Appointment in 1 week. - Dr. Celine Ahr rm 4 Monday 09/24/22 at 2:30 PM Anesthetic: Wound #1 Sacrum: (In  clinic) Topical Lidocaine 5% applied to wound bed - in clinic, prior to debridement Off-Loading: Wound #1 Sacrum: Turn and reposition every 2 hours - place egg carton wedge pillow to off-set weight while sitting or lying flat on back while in the bed WOUND #1: - Sacrum Wound Laterality: Cleanser: Soap and Water 1 x Per Day/30 Days Discharge Instructions: May shower and wash wound with dial antibacterial soap and water  prior to dressing change. Cleanser: Anasept Antimicrobial Skin and Wound Cleanser, 8 (oz) 1 x Per Day/30 Days Discharge Instructions: Cleanse the wound with Anasept cleanser prior to applying a clean dressing using gauze sponges, not tissue or cotton balls. Peri-Wound Care: Zinc Oxide Ointment 30g tube 1 x Per Day/30 Days Discharge Instructions: Apply Zinc Oxide to periwound with each dressing change Prim Dressing: MediHoney Gel, tube 1.5 (oz) 1 x Per Day/30 Days ary Discharge Instructions: Apply to wound bed as instructed Secondary Dressing: ALLEVYN Gentle Border, 5x5 (in/in) 1 x Per Day/30 Days Discharge Instructions: Apply over primary dressing as directed. 09/17/2022: This is an 81 year old woman who developed a pressure ulcer after she fractured her hip. She has a very small wound on her sacrum, measuring just a couple of millimeters in all dimensions. There is some slough and senescent periwound skin. There is some moisture-related breakdown to the periwound skin. No concern for infection. I used a curette to debride slough and senescent skin from her wound. She has done well with Medihoney so we will continue this, I do think she needs some Desitin or zinc oxide applied to the periwound skin for protection against moisture related breakdown. I also advised that she obtain a foam wedge or egg crate foam to use while she is sleeping to avoid pressure on the sacrum. Follow-up in 1 week. Electronic Signature(s) Signed: 09/17/2022 12:05:23 PM By: Danielle Maudlin MD FACS Entered By: Danielle Murray on 09/17/2022 12:05:23 -------------------------------------------------------------------------------- HxROS Details Patient Name: Date of Service: Danielle Griffes NE M. 09/17/2022 10:30 A M Medical Record Number: Danielle Murray Patient Account Number: [id number] Date of Birth/Sex: Treating RN: Danielle Murray-[birth year] (81 y.o. Danielle Murray Primary Care Provider: Deland Murray Other Clinician: Referring  Provider: Treating Provider/Extender: Danielle Murray in Treatment: 0 Information Obtained From Patient Constitutional Symptoms (General Health) Complaints and Symptoms: Negative for: Fatigue; Fever; Chills; Marked Weight Change Danielle Murray, Danielle Murray (Danielle Murray) [id number] Page 7 of 9 Eyes Complaints and Symptoms: Positive for: Glasses / Contacts - glasses Negative for: Dry Eyes; Vision Changes Medical History: Positive for: Cataracts Past Medical History Notes: diabetic retinopathy bilateral eyes Ear/Nose/Mouth/Throat Complaints and Symptoms: Negative for: Chronic sinus problems or rhinitis Medical History: Negative for: Chronic sinus problems/congestion; Middle ear problems Respiratory Complaints and Symptoms: Negative for: Chronic or frequent coughs; Shortness of Breath Medical History: Negative for: Aspiration; Asthma; Chronic Obstructive Pulmonary Disease (COPD); Pneumothorax; Sleep Apnea; Tuberculosis Cardiovascular Complaints and Symptoms: Negative for: Chest pain Medical History: Positive for: Angina; Coronary Artery Disease; Hypertension; Peripheral Arterial Disease Gastrointestinal Complaints and Symptoms: Negative for: Frequent diarrhea; Nausea; Vomiting Medical History: Negative for: Cirrhosis ; Colitis; Crohns; Hepatitis A; Hepatitis B; Hepatitis C Past Medical History Notes: Diverticulosis Endocrine Complaints and Symptoms: Negative for: Heat/cold intolerance Medical History: Positive for: Type II Diabetes Past Medical History Notes: Hypothyroidism Time with diabetes: over 20 years Treated with: Insulin, Oral agents Blood sugar tested every day: Yes Tested : 2-3 per day Musculoskeletal Complaints and Symptoms: Negative for: Muscle Pain; Muscle Weakness Medical History: Positive for: Rheumatoid Arthritis Neurologic Complaints and Symptoms: Positive for: Numbness/parasthesias -  Bila legs Medical  History: Positive for: Neuropathy Psychiatric Complaints and Symptoms: Negative for: Claustrophobia Medical History: Negative for: Anorexia/bulimia; Confinement Anxiety Past Medical History Notes: Depression Danielle Murray, Danielle Murray ([id number]) [id number] Page 8 of 9 Hematologic/Lymphatic Medical History: Positive for: Anemia Negative for: Hemophilia; Human Immunodeficiency Virus; Lymphedema; Sickle Cell Disease Immunological Medical History: Negative for: Lupus Erythematosus; Raynauds; Scleroderma Integumentary (Skin) Medical History: Negative for: History of Burn Oncologic Medical History: Negative for: Received Chemotherapy; Received Radiation HBO Extended History Items Eyes: Cataracts Immunizations Pneumococcal Vaccine: Received Pneumococcal Vaccination: Yes Received Pneumococcal Vaccination On or After 60th Birthday: Yes Implantable Devices No devices added Hospitalization / Surgery History Type of Hospitalization/Surgery 06/25/22- coronary stent intervention 8/23- R/L heart cath and coronary angiography 05/2013- Femur IM nailing (left) 08/2013- Left heart cath with coronary angiogram 06/2013- cataract extraction 06/2005- T knee arthroplasty otal 1989- abd hysterectomy Family and Social History Diabetes: Yes - Siblings; Heart Disease: Yes - Mother,Father,Siblings; Hypertension: Yes - Mother,Father,Siblings; Stroke: Yes - Father; Never smoker; Marital Status - Married; Alcohol Use: Never; Drug Use: No History; Caffeine Use: Daily - coffee, tea; Financial Concerns: No; Food, Clothing or Shelter Needs: No; Support System Lacking: No; Transportation Concerns: No Physician Affirmation I have reviewed and agree with the above information. Electronic Signature(s) Signed: 09/17/2022 12:07:14 PM By: Danielle Maudlin MD FACS Signed: 09/17/2022 4:40:13 PM By: Danielle East RN Entered By: Danielle Murray on 09/17/2022  10:46:57 -------------------------------------------------------------------------------- SuperBill Details Patient Name: Date of Service: Danielle Griffes NE M. 09/17/2022 Medical Record Number: [id number] Patient Account Number: [id number] Date of Birth/Sex: Treating RN: Danielle Murray[birth year] (81 y.o. F) Primary Care Provider: Deland Murray Other Clinician: Referring Provider: Treating Provider/Extender: Danielle Murray in Treatment: 0 Diagnosis Coding ICD-10 Codes Code Description [phone] Pressure ulcer of sacral region, stage 2 Danielle Murray, Danielle Murray ([id number]) [id number] Page 9 of 9 I50.43 Acute on chronic combined systolic (congestive) and diastolic (congestive) heart failure E11.65 Type 2 diabetes mellitus with hyperglycemia E11.622 Type 2 diabetes mellitus with other skin ulcer Facility Procedures : 7 CPT4 Code: [id number] Description: 99213 - WOUND CARE VISIT-LEV 3 EST PT Modifier: 25 Quantity: 1 : 7 CPT4 Code: [id number] Description: [id number] - DEBRIDE WOUND 1ST 20 SQ CM OR < ICD-10 Diagnosis Description L89.152 Pressure ulcer of sacral region, stage 2 Modifier: Quantity: 1 Physician Procedures : CPT4 Code Description Modifier [id number] - WC PHYS LEVEL 4 - NEW PT 25 ICD-10 Diagnosis Description L89.152 Pressure ulcer of sacral region, stage 2 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

## 2022-09-19 DIAGNOSIS — L89152 Pressure ulcer of sacral region, stage 2: Secondary | ICD-10-CM | POA: Diagnosis not present

## 2022-09-20 DIAGNOSIS — E785 Hyperlipidemia, unspecified: Secondary | ICD-10-CM | POA: Diagnosis not present

## 2022-09-20 DIAGNOSIS — I110 Hypertensive heart disease with heart failure: Secondary | ICD-10-CM | POA: Diagnosis not present

## 2022-09-20 DIAGNOSIS — Z7984 Long term (current) use of oral hypoglycemic drugs: Secondary | ICD-10-CM | POA: Diagnosis not present

## 2022-09-20 DIAGNOSIS — Z794 Long term (current) use of insulin: Secondary | ICD-10-CM | POA: Diagnosis not present

## 2022-09-20 DIAGNOSIS — Z8701 Personal history of pneumonia (recurrent): Secondary | ICD-10-CM | POA: Diagnosis not present

## 2022-09-20 DIAGNOSIS — Z955 Presence of coronary angioplasty implant and graft: Secondary | ICD-10-CM | POA: Diagnosis not present

## 2022-09-20 DIAGNOSIS — S72142D Displaced intertrochanteric fracture of left femur, subsequent encounter for closed fracture with routine healing: Secondary | ICD-10-CM | POA: Diagnosis not present

## 2022-09-20 DIAGNOSIS — I252 Old myocardial infarction: Secondary | ICD-10-CM | POA: Diagnosis not present

## 2022-09-20 DIAGNOSIS — I2510 Atherosclerotic heart disease of native coronary artery without angina pectoris: Secondary | ICD-10-CM | POA: Diagnosis not present

## 2022-09-20 DIAGNOSIS — L89312 Pressure ulcer of right buttock, stage 2: Secondary | ICD-10-CM | POA: Diagnosis not present

## 2022-09-20 DIAGNOSIS — Z79899 Other long term (current) drug therapy: Secondary | ICD-10-CM | POA: Diagnosis not present

## 2022-09-20 DIAGNOSIS — E039 Hypothyroidism, unspecified: Secondary | ICD-10-CM | POA: Diagnosis not present

## 2022-09-20 DIAGNOSIS — I5021 Acute systolic (congestive) heart failure: Secondary | ICD-10-CM | POA: Diagnosis not present

## 2022-09-20 DIAGNOSIS — Z9181 History of falling: Secondary | ICD-10-CM | POA: Diagnosis not present

## 2022-09-20 DIAGNOSIS — D500 Iron deficiency anemia secondary to blood loss (chronic): Secondary | ICD-10-CM | POA: Diagnosis not present

## 2022-09-20 DIAGNOSIS — E119 Type 2 diabetes mellitus without complications: Secondary | ICD-10-CM | POA: Diagnosis not present

## 2022-09-24 ENCOUNTER — Encounter (HOSPITAL_BASED_OUTPATIENT_CLINIC_OR_DEPARTMENT_OTHER): Payer: PPO | Admitting: General Surgery

## 2022-09-24 DIAGNOSIS — E11622 Type 2 diabetes mellitus with other skin ulcer: Secondary | ICD-10-CM | POA: Diagnosis not present

## 2022-09-24 DIAGNOSIS — L89152 Pressure ulcer of sacral region, stage 2: Secondary | ICD-10-CM | POA: Diagnosis not present

## 2022-09-24 NOTE — Progress Notes (Signed)
IMO, CUMBIE ([id number]) [id number] Page 1 of 7 Visit Report for 09/24/2022 Arrival Information Details Patient Name: Date of Service: Danielle Murray, HARL. 09/24/2022 2:30 PM Medical Record Number: [id number] Patient Account Number: [id number] Date of Birth/Sex: Treating RN: 06-Jul-1941 (81 y.o. F) Primary Care Danielle Murray: Danielle Murray Other Clinician: Referring Erleen Egner: Treating Ved Martos/Extender: Raylene Everts in Treatment: 1 Visit Information History Since Last Visit All ordered tests and consults were completed: No Patient Arrived: Danielle Murray Added or deleted any medications: No Arrival Time: 14:36 Any new allergies or adverse reactions: No Accompanied By: husband Had a fall or experienced change in No Transfer Assistance: None activities of daily living that may affect Patient Identification Verified: Yes risk of falls: Secondary Verification Process Completed: Yes Signs or symptoms of abuse/neglect since last visito No Patient Requires Transmission-Based Precautions: No Hospitalized since last visit: No Patient Has Alerts: No Implantable device outside of the clinic excluding No cellular tissue based products placed in the center since last visit: Pain Present Now: No Electronic Signature(s) Signed: 09/24/2022 4:34:06 PM By: Worthy Rancher Entered By: Worthy Rancher on 09/24/2022 14:36:45 -------------------------------------------------------------------------------- Encounter Discharge Information Details Patient Name: Date of Service: Danielle Griffes NE M. 09/24/2022 2:30 PM Medical Record Number: [id number] Patient Account Number: [id number] Date of Birth/Sex: Treating RN: 07-23-1941 (81 y.o. Danielle Murray Primary Care Shawntrice Salle: Danielle Murray Other Clinician: Referring Huberta Tompkins: Treating Marygrace Sandoval/Extender: Raylene Everts in Treatment: 1 Encounter Discharge Information Items Post Procedure  Vitals Discharge Condition: Stable Temperature (F): 98.2 Ambulatory Status: Walker Pulse (bpm): 80 Discharge Destination: Home Respiratory Rate (breaths/min): 20 Transportation: Private Auto Blood Pressure (mmHg): 148/75 Accompanied By: self Schedule Follow-up Appointment: No Clinical Summary of Care: Electronic Signature(s) Signed: 09/24/2022 5:04:49 PM By: Blanche East RN Entered By: Blanche East on 09/24/2022 16:31:19 Danielle Murray, Danielle Murray ([id number]) [id number] Page 2 of 7 -------------------------------------------------------------------------------- Lower Extremity Assessment Details Patient Name: Date of Service: [name], [name] 09/24/2022 2:30 PM Medical Record Number: [id number] Patient Account Number: [id number] Date of Birth/Sex: Treating RN: [birth year] (81 y.o. Danielle Murray Primary Care Mindee Robledo: Danielle Murray Other Clinician: Referring Faith Branan: Treating Izik Bingman/Extender: Raylene Everts in Treatment: 1 Electronic Signature(s) Signed: 09/24/2022 5:04:49 PM By: Blanche East RN Entered By: Blanche East on 09/24/2022 14:45:58 -------------------------------------------------------------------------------- Multi Wound Chart Details Patient Name: Date of Service: Danielle Griffes NE M. 09/24/2022 2:30 PM Medical Record Number: [id number] Patient Account Number: [id number] Date of Birth/Sex: Treating RN: 22-Nov-1940 (81 y.o. F) Primary Care Chanse Kagel: Danielle Murray Other Clinician: Referring Eden Rho: Treating Renise Gillies/Extender: Raylene Everts in Treatment: 1 Vital Signs Height(in): 64 Capillary Blood Glucose(mg/dl): 165 Weight(lbs): 172.4 Pulse(bpm): 48 Body Mass Index(BMI): 29.6 Blood Pressure(mmHg): 148/75 Temperature(F): 98.2 Respiratory Rate(breaths/min): 20 [1:Photos:] [N/A:N/A] Sacrum N/A N/A Wound Location: Pressure Injury N/A N/A Wounding Event: Pressure Ulcer N/A N/A Primary  Etiology: Cataracts, Anemia, Angina, Coronary N/A N/A Comorbid History: Artery Disease, Hypertension, Peripheral Arterial Disease, Type II Diabetes, Rheumatoid Arthritis, Neuropathy 06/03/2022 N/A N/A Date Acquired: 1 N/A N/A Weeks of Treatment: Open N/A N/A Wound Status: No N/A N/A Wound Recurrence: 2x1x0.1 N/A N/A Measurements L x W x D (cm) 1.571 N/A N/A A (cm) : rea 0.157 N/A N/A Volume (cm) : -150.20% N/A N/A % Reduction in A rea: -149.20% N/A N/A % Reduction in Volume: Category/Stage II N/A N/A Classification: Medium N/A N/A Exudate A mount: Serous N/A N/A Exudate Type: amber N/A N/A Exudate Color: Medium (34-66%) N/A N/A Granulation A mountRONIN, Murray ([id number]) [id number] Page  3 of 7 Pink N/A N/A Granulation Quality: Medium (34-66%) N/A N/A Necrotic Amount: Fat Layer (Subcutaneous Tissue): Yes N/A N/A Exposed Structures: Fascia: No Tendon: No Muscle: No Joint: No None N/A N/A Epithelialization: Debridement - Selective/Open Wound N/A N/A Debridement: Pre-procedure Verification/Time Out 15:02 N/A N/A Taken: Lidocaine 5% topical ointment N/A N/A Pain Control: Skin/Epidermis N/A N/A Level: 2 N/A N/A Debridement A (sq cm): rea Curette N/A N/A Instrument: Minimum N/A N/A Bleeding: Pressure N/A N/A Hemostasis A chieved: Procedure was tolerated well N/A N/A Debridement Treatment Response: 2x1x0.1 N/A N/A Post Debridement Measurements L x W x D (cm) 0.157 N/A N/A Post Debridement Volume: (cm) Category/Stage II N/A N/A Post Debridement Stage: Excoriation: No N/A N/A Periwound Skin Texture: Maceration: Yes N/A N/A Periwound Skin Moisture: No Abnormalities Noted N/A N/A Periwound Skin Color: Debridement N/A N/A Procedures Performed: Treatment Notes Electronic Signature(s) Signed: 09/24/2022 4:00:33 PM By: Fredirick Maudlin MD FACS Entered By: Fredirick Maudlin on 09/24/2022  16:00:33 -------------------------------------------------------------------------------- Multi-Disciplinary Care Plan Details Patient Name: Date of Service: Danielle Griffes NE M. 09/24/2022 2:30 PM Medical Record Number: [id number] Patient Account Number: [id number] Date of Birth/Sex: Treating RN: 1941-03-17 (81 y.o. Danielle Murray Primary Care Aqil Goetting: Danielle Murray Other Clinician: Referring Lysha Schrade: Treating Theophilus Walz/Extender: Raylene Everts in Treatment: 1 Active Inactive Nutrition Nursing Diagnoses: Potential for alteratiion in Nutrition/Potential for imbalanced nutrition Goals: Patient/caregiver agrees to and verbalizes understanding of need to use nutritional supplements and/or vitamins as prescribed Date Initiated: 09/17/2022 Target Resolution Date: 10/17/2022 Goal Status: Active Interventions: Assess patient nutrition upon admission and as needed per policy Provide education on nutrition Treatment Activities: Dietary management education, guidance and counseling : 09/17/2022 Education provided on Nutrition : 09/17/2022 Notes: Orientation to the Wound Care Program Nursing Diagnoses: Knowledge deficit related to the wound healing center program Danielle Murray, Danielle Murray ([id number]) [id number] Page 4 of 7 Goals: Patient/caregiver will verbalize understanding of the [facility] Date Initiated: 09/17/2022 Target Resolution Date: 10/16/2022 Goal Status: Active Interventions: Provide education on orientation to the wound center Notes: Pressure Nursing Diagnoses: Knowledge deficit related to causes and risk factors for pressure ulcer development Goals: Patient will remain free of pressure ulcers Date Initiated: 09/17/2022 Target Resolution Date: 10/15/2022 Goal Status: Active Patient/caregiver will verbalize understanding of pressure ulcer management Date Initiated: 09/17/2022 Target Resolution Date: 10/15/2022 Goal  Status: Active Interventions: Assess: immobility, friction, shearing, incontinence upon admission and as needed Assess offloading mechanisms upon admission and as needed Assess potential for pressure ulcer upon admission and as needed Treatment Activities: Patient referred for pressure reduction/relief devices : 09/17/2022 Patient referred for seating evaluation to ensure proper offloading : 09/17/2022 Notes: Wound/Skin Impairment Nursing Diagnoses: Impaired tissue integrity Goals: Ulcer/skin breakdown will have a volume reduction of 30% by week 4 Date Initiated: 09/17/2022 Target Resolution Date: 10/15/2022 Goal Status: Active Interventions: Assess ulceration(s) every visit Provide education on ulcer and skin care Treatment Activities: Skin care regimen initiated : 09/17/2022 Notes: Electronic Signature(s) Signed: 09/24/2022 5:04:49 PM By: Blanche East RN Entered By: Blanche East on 09/24/2022 15:11:39 -------------------------------------------------------------------------------- Pain Assessment Details Patient Name: Date of Service: Danielle Griffes NE M. 09/24/2022 2:30 PM Medical Record Number: [id number] Patient Account Number: [id number] Date of Birth/Sex: Treating RN: 1941-09-06 (81 y.o. F) Primary Care Raiya Stainback: Danielle Murray Other Clinician: Referring Mirah Nevins: Treating Sarabella Caprio/Extender: Raylene Everts in Treatment: 1 Active Problems Deloney, Danielle Murray ([id number]) [id number] Page 5 of 7 Location of Pain Severity and Description of Pain Patient Has Paino No Site Locations Pain Management  and Medication Current Pain Management: Electronic Signature(s) Signed: 09/24/2022 4:34:06 PM By: Worthy Rancher Entered By: Worthy Rancher on 09/24/2022 14:39:09 -------------------------------------------------------------------------------- Patient/Caregiver Education Details Patient Name: Date of Service: Danielle Ludwig.  11/13/2023andnbsp2:30 PM Medical Record Number: [id number] Patient Account Number: [id number] Date of Birth/Gender: Treating RN: [birth year] (81 y.o. Danielle Murray Primary Care Physician: Danielle Murray Other Clinician: Referring Physician: Treating Physician/Extender: Raylene Everts in Treatment: 1 Education Assessment Education Provided To: Patient Education Topics Provided Nutrition: Methods: Explain/Verbal Responses: Reinforcements needed, State content correctly Wound/Skin Impairment: Methods: Explain/Verbal Responses: Reinforcements needed, State content correctly Electronic Signature(s) Signed: 09/24/2022 5:04:49 PM By: Blanche East RN Entered By: Blanche East on 09/24/2022 15:12:11 Danielle Murray, Danielle Murray ([id number]) [id number] Page 6 of 7 -------------------------------------------------------------------------------- Wound Assessment Details Patient Name: Date of Service: Danielle Murray, Danielle Murray. 09/24/2022 2:30 PM Medical Record Number: [id number] Patient Account Number: [id number] Date of Birth/Sex: Treating RN: 1940/11/23 (81 y.o. [name], Jamie Primary Care Nikya Busler: Danielle Murray Other Clinician: Referring Jodye Scali: Treating Long Brimage/Extender: Raylene Everts in Treatment: 1 Wound Status Wound Number: 1 Primary Pressure Ulcer Etiology: Wound Location: Sacrum Wound Open Wounding Event: Pressure Injury Status: Date Acquired: 06/03/2022 Comorbid Cataracts, Anemia, Angina, Coronary Artery Disease, Weeks Of Treatment: 1 History: Hypertension, Peripheral Arterial Disease, Type II Diabetes, Clustered Wound: No Rheumatoid Arthritis, Neuropathy Photos Wound Measurements Length: (cm) 2 Width: (cm) 1 Depth: (cm) 0.1 Area: (cm) 1.571 Volume: (cm) 0.157 % Reduction in Area: -150.2% % Reduction in Volume: -149.2% Epithelialization: None Tunneling: No Undermining: No Wound Description Classification:  Category/Stage II Exudate Amount: Medium Exudate Type: Serous Exudate Color: amber Foul Odor After Cleansing: No Slough/Fibrino Yes Wound Bed Granulation Amount: Medium (34-66%) Exposed Structure Granulation Quality: Pink Fascia Exposed: No Necrotic Amount: Medium (34-66%) Fat Layer (Subcutaneous Tissue) Exposed: Yes Necrotic Quality: Adherent Slough Tendon Exposed: No Muscle Exposed: No Joint Exposed: No Periwound Skin Texture Texture Color No Abnormalities Noted: No No Abnormalities Noted: Yes Excoriation: No Moisture No Abnormalities Noted: No Maceration: Yes Treatment Notes Wound #1 (Sacrum) Cleanser Soap and Water Discharge Instruction: May shower and wash wound with dial antibacterial soap and water prior to dressing change. Danielle Murray, Danielle Murray ([id number]) [id number] Page 7 of 7 Anasept Antimicrobial Skin and Wound Cleanser, 8 (oz) Discharge Instruction: Cleanse the wound with Anasept cleanser prior to applying a clean dressing using gauze sponges, not tissue or cotton balls. Peri-Wound Care Zinc Oxide Ointment 30g tube Discharge Instruction: Apply Zinc Oxide to periwound with each dressing change Topical Primary Dressing KerraCel Ag Gelling Fiber Dressing, 2x2 in (silver alginate) Discharge Instruction: Apply silver alginate to wound bed as instructed Secondary Dressing ALLEVYN Gentle Border, 5x5 (in/in) Discharge Instruction: Apply over primary dressing as directed. Secured With Compression Wrap Compression Stockings Environmental education officer) Signed: 09/24/2022 5:04:49 PM By: Blanche East RN Entered By: Blanche East on 09/24/2022 14:50:10 -------------------------------------------------------------------------------- Vitals Details Patient Name: Date of Service: Danielle Griffes NE M. 09/24/2022 2:30 PM Medical Record Number: [id number] Patient Account Number: [id number] Date of Birth/Sex: Treating RN: 12-11-1940 (81 y.o.  F) Primary Care Mac Dowdell: Danielle Murray Other Clinician: Referring Dartha Rozzell: Treating Kyliana Standen/Extender: Raylene Everts in Treatment: 1 Vital Signs Time Taken: 02:36 Temperature (F): 98.2 Height (in): 64 Pulse (bpm): 80 Weight (lbs): 172.4 Respiratory Rate (breaths/min): 20 Body Mass Index (BMI): 29.6 Blood Pressure (mmHg): 148/75 Capillary Blood Glucose (mg/dl): 165 Reference Range: 80 - 120 mg / dl Electronic Signature(s) Signed: 09/24/2022 4:34:06 PM By: Worthy Rancher Entered By: Worthy Rancher on 09/24/2022 14:38:03

## 2022-09-25 ENCOUNTER — Other Ambulatory Visit: Payer: Self-pay | Admitting: Physician Assistant

## 2022-09-25 ENCOUNTER — Ambulatory Visit (HOSPITAL_COMMUNITY): Payer: PPO | Attending: Physician Assistant

## 2022-09-25 DIAGNOSIS — I10 Essential (primary) hypertension: Secondary | ICD-10-CM

## 2022-09-25 DIAGNOSIS — E785 Hyperlipidemia, unspecified: Secondary | ICD-10-CM

## 2022-09-25 DIAGNOSIS — R0609 Other forms of dyspnea: Secondary | ICD-10-CM

## 2022-09-25 DIAGNOSIS — I5021 Acute systolic (congestive) heart failure: Secondary | ICD-10-CM

## 2022-09-25 DIAGNOSIS — I2510 Atherosclerotic heart disease of native coronary artery without angina pectoris: Secondary | ICD-10-CM

## 2022-09-25 DIAGNOSIS — I5043 Acute on chronic combined systolic (congestive) and diastolic (congestive) heart failure: Secondary | ICD-10-CM

## 2022-09-25 DIAGNOSIS — I739 Peripheral vascular disease, unspecified: Secondary | ICD-10-CM

## 2022-09-25 LAB — ECHOCARDIOGRAM LIMITED
Area-P 1/2: 2.54 cm2
S' Lateral: 3 cm

## 2022-09-25 NOTE — Progress Notes (Signed)
Danielle Murray, Danielle Murray ([id number]) [id number] Page 1 of 9 Visit Report for 09/24/2022 Chief Complaint Document Details Patient Name: Date of Service: Danielle, Murray. 09/24/2022 2:30 PM Medical Record Number: [id number] Patient Account Number: [id number] Date of Birth/Sex: Treating RN: 02-Jun-1941 (81 y.o. F) Primary Care Provider: Deland Murray Other Clinician: Referring Provider: Treating Provider/Extender: Danielle Murray in Treatment: 1 Information Obtained from: Patient Chief Complaint Patient is at the clinic for treatment of an open pressure ulcer Electronic Signature(s) Signed: 09/24/2022 4:01:30 PM By: Danielle Maudlin MD FACS Entered By: Danielle Murray on 09/24/2022 16:01:30 -------------------------------------------------------------------------------- Debridement Details Patient Name: Date of Service: Danielle Griffes NE M. 09/24/2022 2:30 PM Medical Record Number: [id number] Patient Account Number: [id number] Date of Birth/Sex: Treating RN: 07-02-1941 (81 y.o. [name] Primary Care Provider: Deland Murray Other Clinician: Referring Provider: Treating Provider/Extender: Danielle Murray in Treatment: 1 Debridement Performed for Assessment: Wound #1 Sacrum Performed By: Physician Danielle Maudlin, MD Debridement Type: Debridement Level of Consciousness (Pre-procedure): Awake and Alert Pre-procedure Verification/Time Out Yes - 15:02 Taken: Start Time: 15:03 Pain Control: Lidocaine 5% topical ointment T Area Debrided (L x W): otal 2 (cm) x 1 (cm) = 2 (cm) Tissue and other material debrided: Non-Viable, Skin: Epidermis Level: Skin/Epidermis Debridement Description: Selective/Open Wound Instrument: Curette Bleeding: Minimum Hemostasis Achieved: Pressure Response to Treatment: Procedure was tolerated well Level of Consciousness (Post- Awake and Alert procedure): Post Debridement Measurements of  Total Wound Length: (cm) 2 Stage: Category/Stage II Width: (cm) 1 Depth: (cm) 0.1 Volume: (cm) 0.157 Character of Wound/Ulcer Post Debridement: Requires Further Debridement Post Procedure Diagnosis Same as Pre-procedure Danielle Murray, Danielle Murray ([id number]) [id number] Page 2 of 9 Notes Scribed for Dr. Celine Ahr by Blanche East, RN Electronic Signature(s) Signed: 09/24/2022 5:04:49 PM By: Blanche East RN Signed: 09/25/2022 7:39:34 AM By: Danielle Maudlin MD FACS Entered By: Blanche East on 09/24/2022 15:04:48 -------------------------------------------------------------------------------- HPI Details Patient Name: Date of Service: Danielle Griffes NE M. 09/24/2022 2:30 PM Medical Record Number: [id number] Patient Account Number: [id number] Date of Birth/Sex: Treating RN: February 02, 1941 (81 y.o. F) Primary Care Provider: Deland Murray Other Clinician: Referring Provider: Treating Provider/Extender: Danielle Murray in Treatment: 1 History of Present Illness HPI Description: ADMISSION 09/17/2022 This is an 81 year old poorly controlled type II diabetic (last hemoglobin A1c 7.2) who fell and had an intertrochanteric fracture of her left hip in July. She developed a pressure ulcer on her sacrum while in the hospital. She was discharged to rehab and then subsequently readmitted to the hospital with what sounds like a CHF exacerbation and pneumonia. Wound ostomy nurses were consulted and they recommended Medihoney application. They have been doing this ever since her discharge apparently, and the wound is now down to just a couple of millimeters. Due to the fact that it has been present since July, however, she was referred to the wound care center for further evaluation and management. The patient says that she does sleep on a regular mattress and is aware of the need to stay off of her sacral area is much as possible, but due to the rod in her hip, she  cannot rotate from side to side particularly well. She is in a recliner much of the day and apparently has some sort of honeycomb-type foam that she sits on. She reports what sounds like better than average protein intake, including a Premier protein beverage daily. On inspection, she has a very small wound on her sacrum, measuring just a couple of millimeters in  all dimensions. There is some slough and senescent periwound skin. There is some moisture-related breakdown to the periwound skin. No concern for infection. 09/24/2022: The wound is about the same size. There is still some senescent skin buildup and periwound maceration. She continues to spend most of her day seated in her recliner. Electronic Signature(s) Signed: 09/24/2022 4:02:24 PM By: Danielle Maudlin MD FACS Entered By: Danielle Murray on 09/24/2022 16:02:24 -------------------------------------------------------------------------------- Physical Exam Details Patient Name: Date of Service: Danielle Griffes NE M. 09/24/2022 2:30 PM Medical Record Number: [id number] Patient Account Number: [id number] Date of Birth/Sex: Treating RN: 05/22/1941 (81 y.o. F) Primary Care Provider: Deland Murray Other Clinician: Referring Provider: Treating Provider/Extender: Danielle Murray in Treatment: 1 Constitutional Slightly hypertensive. . . . No acute distress.Marland Kitchen Respiratory Normal work of breathing on room air.. Notes 09/24/2022: The wound is about the same size. There is still some senescent skin buildup and periwound maceration. Electronic Signature(s) Danielle Murray, Danielle Murray ([id number]) [id number] Page 3 of 9 Signed: 09/24/2022 4:03:12 PM By: Danielle Maudlin MD FACS Entered By: Danielle Murray on 09/24/2022 16:03:12 -------------------------------------------------------------------------------- Physician Orders Details Patient Name: Date of Service: Danielle Griffes NE M. 09/24/2022 2:30  PM Medical Record Number: [id number] Patient Account Number: [id number] Date of Birth/Sex: Treating RN: [birth year] (81 y.o. [name], [name] Primary Care Provider: Deland Murray Other Clinician: Referring Provider: Treating Provider/Extender: Danielle Murray in Treatment: 1 Verbal / Phone Orders: No Diagnosis Coding ICD-10 Coding Code Description [phone] Pressure ulcer of sacral region, stage 2 I50.43 Acute on chronic combined systolic (congestive) and diastolic (congestive) heart failure E11.65 Type 2 diabetes mellitus with hyperglycemia E11.622 Type 2 diabetes mellitus with other skin ulcer Follow-up Appointments ppointment in 1 week. - Dr. Celine Ahr rm 4 Return A Anesthetic Wound #1 Sacrum (In clinic) Topical Lidocaine 5% applied to wound bed - in clinic, prior to debridement Off-Loading Wound #1 Sacrum Roho cushion for wheelchair - WIll order ROHO cushion for offloading weight while in the chair and while in bed Turn and reposition every 2 hours - place egg carton wedge pillow to off-set weight while sitting or lying flat on back while in the bed Wound Treatment Wound #1 - Sacrum Cleanser: Soap and Water 1 x Per Day/30 Days Discharge Instructions: May shower and wash wound with dial antibacterial soap and water prior to dressing change. Cleanser: Anasept Antimicrobial Skin and Wound Cleanser, 8 (oz) 1 x Per Day/30 Days Discharge Instructions: Cleanse the wound with Anasept cleanser prior to applying a clean dressing using gauze sponges, not tissue or cotton balls. Peri-Wound Care: Zinc Oxide Ointment 30g tube 1 x Per Day/30 Days Discharge Instructions: Apply Zinc Oxide to periwound with each dressing change Prim Dressing: KerraCel Ag Gelling Fiber Dressing, 2x2 in (silver alginate) (DME) (Generic) 1 x Per Day/30 Days ary Discharge Instructions: Apply silver alginate to wound bed as instructed Secondary Dressing: ALLEVYN Gentle Border, 5x5 (in/in) (Generic) 1 x  Per Day/30 Days Discharge Instructions: Apply over primary dressing as directed. Electronic Signature(s) Signed: 09/25/2022 7:39:34 AM By: Danielle Maudlin MD FACS Entered By: Danielle Murray on 09/24/2022 16:03:30 Knouff, [name] ([id number]) [id number] Page 4 of 9 -------------------------------------------------------------------------------- Problem List Details Patient Name: Date of Service: Danielle Murray, KOSEL. 09/24/2022 2:30 PM Medical Record Number: [id number] Patient Account Number: [id number] Date of Birth/Sex: Treating RN: [birth year] (81 y.o. F) Primary Care Provider: Deland Murray Other Clinician: Referring Provider: Treating Provider/Extender: Danielle Murray in Treatment: 1 Active Problems ICD-10 Encounter Code Description Active Date MDM  Diagnosis L89.152 Pressure ulcer of sacral region, stage 2 09/17/2022 No Yes I50.43 Acute on chronic combined systolic (congestive) and diastolic (congestive) 00/07/3817 No Yes heart failure E11.65 Type 2 diabetes mellitus with hyperglycemia 09/17/2022 No Yes E11.622 Type 2 diabetes mellitus with other skin ulcer 09/17/2022 No Yes Inactive Problems Resolved Problems Electronic Signature(s) Signed: 09/24/2022 3:42:55 PM By: Danielle Maudlin MD FACS Entered By: Danielle Murray on 09/24/2022 15:42:55 -------------------------------------------------------------------------------- Progress Note Details Patient Name: Date of Service: Danielle Griffes NE M. 09/24/2022 2:30 PM Medical Record Number: [id number] Patient Account Number: [id number] Date of Birth/Sex: Treating RN: 18-Nov-1940 (81 y.o. F) Primary Care Provider: Deland Murray Other Clinician: Referring Provider: Treating Provider/Extender: Danielle Murray in Treatment: 1 Subjective Chief Complaint Information obtained from Patient Patient is at the clinic for treatment of an open pressure ulcer History of  Present Illness (HPI) ADMISSION 09/17/2022 This is an 81 year old poorly controlled type II diabetic (last hemoglobin A1c 7.2) who fell and had an intertrochanteric fracture of her left hip in July. She developed a pressure ulcer on her sacrum while in the hospital. She was discharged to rehab and then subsequently readmitted to the hospital with what sounds like a CHF exacerbation and pneumonia. Wound ostomy nurses were consulted and they recommended Medihoney application. They have been doing this ever since her discharge apparently, and the wound is now down to just a couple of millimeters. Due to the fact that it has been present since July, however, she was referred to the wound care center for further evaluation and management. The patient says that she does sleep on a regular mattress and is aware of the need to stay off of her sacral area is much as possible, but due to the rod in her hip, she cannot rotate from side to side particularly well. She is in a recliner much of the day and apparently has some sort of honeycomb-type foam that she sits on. She reports what sounds like better than average protein intake, including a Premier protein beverage daily. Danielle Murray, Danielle Murray ([id number]) [id number] Page 5 of 9 On inspection, she has a very small wound on her sacrum, measuring just a couple of millimeters in all dimensions. There is some slough and senescent periwound skin. There is some moisture-related breakdown to the periwound skin. No concern for infection. 09/24/2022: The wound is about the same size. There is still some senescent skin buildup and periwound maceration. She continues to spend most of her day seated in her recliner. Patient History Information obtained from Patient. Family History Diabetes - Siblings, Heart Disease - Mother,Father,Siblings, Hypertension - Mother,Father,Siblings, Stroke - Father. Social History Never smoker, Marital Status -  Married, Alcohol Use - Never, Drug Use - No History, Caffeine Use - Daily - coffee, tea. Medical History Eyes Patient has history of Cataracts Ear/Nose/Mouth/Throat Denies history of Chronic sinus problems/congestion, Middle ear problems Hematologic/Lymphatic Patient has history of Anemia Denies history of Hemophilia, Human Immunodeficiency Virus, Lymphedema, Sickle Cell Disease Respiratory Denies history of Aspiration, Asthma, Chronic Obstructive Pulmonary Disease (COPD), Pneumothorax, Sleep Apnea, Tuberculosis Cardiovascular Patient has history of Angina, Coronary Artery Disease, Hypertension, Peripheral Arterial Disease Gastrointestinal Denies history of Cirrhosis , Colitis, Crohnoos, Hepatitis A, Hepatitis B, Hepatitis C Endocrine Patient has history of Type II Diabetes Immunological Denies history of Lupus Erythematosus, Raynaudoos, Scleroderma Integumentary (Skin) Denies history of History of Burn Musculoskeletal Patient has history of Rheumatoid Arthritis Neurologic Patient has history of Neuropathy Oncologic Denies history of Received Chemotherapy, Received Radiation Psychiatric Denies history of  Anorexia/bulimia, Confinement Anxiety Hospitalization/Surgery History - 06/25/22- coronary stent intervention. - 8/23- R/L heart cath and coronary angiography. - 05/2013- Femur IM nailing (left). - 08/2013- Left heart cath with coronary angiogram. - 06/2013- cataract extraction. - 06/2005- T knee arthroplasty. - 1989- abd hysterectomy. otal Medical A Surgical History Notes nd Eyes diabetic retinopathy bilateral eyes Gastrointestinal Diverticulosis Endocrine Hypothyroidism Psychiatric Depression Objective Constitutional Slightly hypertensive. No acute distress.. Vitals Time Taken: 2:36 AM, Height: 64 in, Weight: 172.4 lbs, BMI: 29.6, Temperature: 98.2 F, Pulse: 80 bpm, Respiratory Rate: 20 breaths/min, Blood Pressure: 148/75 mmHg, Capillary Blood Glucose: 165  mg/dl. Respiratory Normal work of breathing on room air.. General Notes: 09/24/2022: The wound is about the same size. There is still some senescent skin buildup and periwound maceration. Integumentary (Hair, Skin) Wound #1 status is Open. Original cause of wound was Pressure Injury. The date acquired was: 06/03/2022. The wound has been in treatment 1 weeks. The wound is located on the Sacrum. The wound measures 2cm length x 1cm width x 0.1cm depth; 1.571cm^2 area and 0.157cm^3 volume. There is Fat Layer (Subcutaneous Tissue) exposed. There is no tunneling or undermining noted. There is a medium amount of serous drainage noted. There is medium (34-66%) pink granulation within the wound bed. There is a medium (34-66%) amount of necrotic tissue within the wound bed including Adherent Slough. The periwound skin appearance had no abnormalities noted for color. The periwound skin appearance exhibited: Maceration. The periwound skin appearance did not exhibit: Excoriation. Danielle Murray, Danielle Murray ([id number]) [id number] Page 6 of 9 Assessment Active Problems ICD-10 Pressure ulcer of sacral region, stage 2 Acute on chronic combined systolic (congestive) and diastolic (congestive) heart failure Type 2 diabetes mellitus with hyperglycemia Type 2 diabetes mellitus with other skin ulcer Procedures Wound #1 Pre-procedure diagnosis of Wound #1 is a Pressure Ulcer located on the Sacrum . There was a Selective/Open Wound Skin/Epidermis Debridement with a total area of 2 sq cm performed by Danielle Maudlin, MD. With the following instrument(s): Curette to remove Non-Viable tissue/material. Material removed includes Skin: Epidermis after achieving pain control using Lidocaine 5% topical ointment. No specimens were taken. A time out was conducted at 15:02, prior to the start of the procedure. A Minimum amount of bleeding was controlled with Pressure. The procedure was tolerated well. Post  Debridement Measurements: 2cm length x 1cm width x 0.1cm depth; 0.157cm^3 volume. Post debridement Stage noted as Category/Stage II. Character of Wound/Ulcer Post Debridement requires further debridement. Post procedure Diagnosis Wound #1: Same as Pre-Procedure General Notes: Scribed for Dr. Celine Ahr by Blanche East, RN. Plan Follow-up Appointments: Return Appointment in 1 week. - Dr. Celine Ahr rm 4 Anesthetic: Wound #1 Sacrum: (In clinic) Topical Lidocaine 5% applied to wound bed - in clinic, prior to debridement Off-Loading: Wound #1 Sacrum: Roho cushion for wheelchair - WIll order ROHO cushion for offloading weight while in the chair and while in bed Turn and reposition every 2 hours - place egg carton wedge pillow to off-set weight while sitting or lying flat on back while in the bed WOUND #1: - Sacrum Wound Laterality: Cleanser: Soap and Water 1 x Per Day/30 Days Discharge Instructions: May shower and wash wound with dial antibacterial soap and water prior to dressing change. Cleanser: Anasept Antimicrobial Skin and Wound Cleanser, 8 (oz) 1 x Per Day/30 Days Discharge Instructions: Cleanse the wound with Anasept cleanser prior to applying a clean dressing using gauze sponges, not tissue or cotton balls. Peri-Wound Care: Zinc Oxide Ointment 30g tube 1 x Per Day/30  Days Discharge Instructions: Apply Zinc Oxide to periwound with each dressing change Prim Dressing: KerraCel Ag Gelling Fiber Dressing, 2x2 in (silver alginate) (DME) (Generic) 1 x Per Day/30 Days ary Discharge Instructions: Apply silver alginate to wound bed as instructed Secondary Dressing: ALLEVYN Gentle Border, 5x5 (in/in) (Generic) 1 x Per Day/30 Days Discharge Instructions: Apply over primary dressing as directed. 09/24/2022: The wound is about the same size. There is still some senescent skin buildup and periwound maceration. We used a curette to debride the very senescent skin from her wound. I think the Medihoney is  keeping the area a bit too moist so we will discontinue that. We will change to silver alginate. I demonstrated to the patient's husband the area where he needs to be applying the zinc oxide. Continue to cover the wound with a foam border. I reemphasized the need for the patient to offload the site. Follow-up in 1 week. Electronic Signature(s) Signed: 09/24/2022 4:04:48 PM By: Danielle Maudlin MD FACS Entered By: Danielle Murray on 09/24/2022 16:04:48 -------------------------------------------------------------------------------- HxROS Details Patient Name: Date of Service: Danielle Griffes NE M. 09/24/2022 2:30 PM [employer] [place] ([id number]) [id number] Page 7 of 9 Medical Record Number: [id number] Patient Account Number: [id number] Date of Birth/Sex: Treating RN: 1941/09/16 (81 y.o. F) Primary Care Provider: Deland Murray Other Clinician: Referring Provider: Treating Provider/Extender: Danielle Murray in Treatment: 1 Information Obtained From Patient Eyes Medical History: Positive for: Cataracts Past Medical History Notes: diabetic retinopathy bilateral eyes Ear/Nose/Mouth/Throat Medical History: Negative for: Chronic sinus problems/congestion; Middle ear problems Hematologic/Lymphatic Medical History: Positive for: Anemia Negative for: Hemophilia; Human Immunodeficiency Virus; Lymphedema; Sickle Cell Disease Respiratory Medical History: Negative for: Aspiration; Asthma; Chronic Obstructive Pulmonary Disease (COPD); Pneumothorax; Sleep Apnea; Tuberculosis Cardiovascular Medical History: Positive for: Angina; Coronary Artery Disease; Hypertension; Peripheral Arterial Disease Gastrointestinal Medical History: Negative for: Cirrhosis ; Colitis; Crohns; Hepatitis A; Hepatitis B; Hepatitis C Past Medical History Notes: Diverticulosis Endocrine Medical History: Positive for: Type II Diabetes Past Medical History  Notes: Hypothyroidism Time with diabetes: over 43 years Treated with: Insulin, Oral agents Blood sugar tested every day: Yes Tested : 2-3 per day Immunological Medical History: Negative for: Lupus Erythematosus; Raynauds; Scleroderma Integumentary (Skin) Medical History: Negative for: History of Burn Musculoskeletal Medical History: Positive for: Rheumatoid Arthritis Neurologic Medical History: Positive for: Neuropathy Oncologic Medical History: Negative for: Received Chemotherapy; Received Radiation Danielle Murray, Danielle Murray ([id number]) [id number] Page 8 of 9 Psychiatric Medical History: Negative for: Anorexia/bulimia; Confinement Anxiety Past Medical History Notes: Depression HBO Extended History Items Eyes: Cataracts Immunizations Pneumococcal Vaccine: Received Pneumococcal Vaccination: Yes Received Pneumococcal Vaccination On or After 60th Birthday: Yes Implantable Devices No devices added Hospitalization / Surgery History Type of Hospitalization/Surgery 06/25/22- coronary stent intervention 8/23- R/L heart cath and coronary angiography 05/2013- Femur IM nailing (left) 08/2013- Left heart cath with coronary angiogram 06/2013- cataract extraction 06/2005- T knee arthroplasty otal 1989- abd hysterectomy Family and Social History Diabetes: Yes - Siblings; Heart Disease: Yes - Mother,Father,Siblings; Hypertension: Yes - Mother,Father,Siblings; Stroke: Yes - Father; Never smoker; Marital Status - Married; Alcohol Use: Never; Drug Use: No History; Caffeine Use: Daily - coffee, tea; Financial Concerns: No; Food, Clothing or Shelter Needs: No; Support System Lacking: No; Transportation Concerns: No Electronic Signature(s) Signed: 09/25/2022 7:39:34 AM By: Danielle Maudlin MD FACS Entered By: Danielle Murray on 09/24/2022 16:02:42 -------------------------------------------------------------------------------- SuperBill Details Patient Name: Date of  Service: Danielle Griffes NE M. 09/24/2022 Medical Record Number: [id number] Patient Account Number: [id number] Date of Birth/Sex: Treating RN: 1941-01-14 (81 y.o.  F) Primary Care Provider: Deland Murray Other Clinician: Referring Provider: Treating Provider/Extender: Danielle Murray in Treatment: 1 Diagnosis Coding ICD-10 Codes Code Description [phone] Pressure ulcer of sacral region, stage 2 I50.43 Acute on chronic combined systolic (congestive) and diastolic (congestive) heart failure E11.65 Type 2 diabetes mellitus with hyperglycemia E11.622 Type 2 diabetes mellitus with other skin ulcer Facility Procedures Physician Procedures : CPT4 Code Description Modifier [id number] - WC PHYS LEVEL 3 - EST PT 25 ICD-10 Diagnosis Description L89.152 Pressure ulcer of sacral region, stage 2 E11.622 Type 2 diabetes mellitus with other skin ulcer E11.65 Type 2 diabetes mellitus with  hyperglycemia I50.43 Acute on chronic combined systolic (congestive) and diastolic (congestive) heart failure Quantity: 1 : [id number] - WC PHYS DEBR WO ANESTH 20 SQ CM ICD-10 Diagnosis Description L89.152 Pressure ulcer of sacral region, stage 2 Quantity: 1 Electronic Signature(s) Signed: 09/24/2022 4:05:07 PM By: Danielle Maudlin MD FACS Entered By: Danielle Murray on 09/24/2022 16:05:06

## 2022-09-27 DIAGNOSIS — L89152 Pressure ulcer of sacral region, stage 2: Secondary | ICD-10-CM | POA: Diagnosis not present

## 2022-09-28 DIAGNOSIS — E119 Type 2 diabetes mellitus without complications: Secondary | ICD-10-CM | POA: Diagnosis not present

## 2022-09-28 DIAGNOSIS — Z7984 Long term (current) use of oral hypoglycemic drugs: Secondary | ICD-10-CM | POA: Diagnosis not present

## 2022-09-28 DIAGNOSIS — Z794 Long term (current) use of insulin: Secondary | ICD-10-CM | POA: Diagnosis not present

## 2022-09-28 DIAGNOSIS — Z955 Presence of coronary angioplasty implant and graft: Secondary | ICD-10-CM | POA: Diagnosis not present

## 2022-09-28 DIAGNOSIS — Z8701 Personal history of pneumonia (recurrent): Secondary | ICD-10-CM | POA: Diagnosis not present

## 2022-09-28 DIAGNOSIS — E785 Hyperlipidemia, unspecified: Secondary | ICD-10-CM | POA: Diagnosis not present

## 2022-09-28 DIAGNOSIS — D500 Iron deficiency anemia secondary to blood loss (chronic): Secondary | ICD-10-CM | POA: Diagnosis not present

## 2022-09-28 DIAGNOSIS — I2510 Atherosclerotic heart disease of native coronary artery without angina pectoris: Secondary | ICD-10-CM | POA: Diagnosis not present

## 2022-09-28 DIAGNOSIS — I252 Old myocardial infarction: Secondary | ICD-10-CM | POA: Diagnosis not present

## 2022-09-28 DIAGNOSIS — I5021 Acute systolic (congestive) heart failure: Secondary | ICD-10-CM | POA: Diagnosis not present

## 2022-09-28 DIAGNOSIS — L89312 Pressure ulcer of right buttock, stage 2: Secondary | ICD-10-CM | POA: Diagnosis not present

## 2022-09-28 DIAGNOSIS — Z79899 Other long term (current) drug therapy: Secondary | ICD-10-CM | POA: Diagnosis not present

## 2022-09-28 DIAGNOSIS — S72142D Displaced intertrochanteric fracture of left femur, subsequent encounter for closed fracture with routine healing: Secondary | ICD-10-CM | POA: Diagnosis not present

## 2022-09-28 DIAGNOSIS — E039 Hypothyroidism, unspecified: Secondary | ICD-10-CM | POA: Diagnosis not present

## 2022-09-28 DIAGNOSIS — Z9181 History of falling: Secondary | ICD-10-CM | POA: Diagnosis not present

## 2022-09-28 DIAGNOSIS — I110 Hypertensive heart disease with heart failure: Secondary | ICD-10-CM | POA: Diagnosis not present

## 2022-10-03 ENCOUNTER — Encounter (HOSPITAL_BASED_OUTPATIENT_CLINIC_OR_DEPARTMENT_OTHER): Payer: PPO | Admitting: General Surgery

## 2022-10-03 DIAGNOSIS — I2510 Atherosclerotic heart disease of native coronary artery without angina pectoris: Secondary | ICD-10-CM | POA: Diagnosis not present

## 2022-10-03 DIAGNOSIS — Z9181 History of falling: Secondary | ICD-10-CM | POA: Diagnosis not present

## 2022-10-03 DIAGNOSIS — I110 Hypertensive heart disease with heart failure: Secondary | ICD-10-CM | POA: Diagnosis not present

## 2022-10-03 DIAGNOSIS — L89152 Pressure ulcer of sacral region, stage 2: Secondary | ICD-10-CM | POA: Diagnosis not present

## 2022-10-03 DIAGNOSIS — Z79899 Other long term (current) drug therapy: Secondary | ICD-10-CM | POA: Diagnosis not present

## 2022-10-03 DIAGNOSIS — E039 Hypothyroidism, unspecified: Secondary | ICD-10-CM | POA: Diagnosis not present

## 2022-10-03 DIAGNOSIS — Z8701 Personal history of pneumonia (recurrent): Secondary | ICD-10-CM | POA: Diagnosis not present

## 2022-10-03 DIAGNOSIS — S72142D Displaced intertrochanteric fracture of left femur, subsequent encounter for closed fracture with routine healing: Secondary | ICD-10-CM | POA: Diagnosis not present

## 2022-10-03 DIAGNOSIS — Z955 Presence of coronary angioplasty implant and graft: Secondary | ICD-10-CM | POA: Diagnosis not present

## 2022-10-03 DIAGNOSIS — Z7984 Long term (current) use of oral hypoglycemic drugs: Secondary | ICD-10-CM | POA: Diagnosis not present

## 2022-10-03 DIAGNOSIS — D500 Iron deficiency anemia secondary to blood loss (chronic): Secondary | ICD-10-CM | POA: Diagnosis not present

## 2022-10-03 DIAGNOSIS — L89312 Pressure ulcer of right buttock, stage 2: Secondary | ICD-10-CM | POA: Diagnosis not present

## 2022-10-03 DIAGNOSIS — I5021 Acute systolic (congestive) heart failure: Secondary | ICD-10-CM | POA: Diagnosis not present

## 2022-10-03 DIAGNOSIS — E785 Hyperlipidemia, unspecified: Secondary | ICD-10-CM | POA: Diagnosis not present

## 2022-10-03 DIAGNOSIS — I252 Old myocardial infarction: Secondary | ICD-10-CM | POA: Diagnosis not present

## 2022-10-03 DIAGNOSIS — Z794 Long term (current) use of insulin: Secondary | ICD-10-CM | POA: Diagnosis not present

## 2022-10-03 DIAGNOSIS — E119 Type 2 diabetes mellitus without complications: Secondary | ICD-10-CM | POA: Diagnosis not present

## 2022-10-04 NOTE — Progress Notes (Signed)
Danielle Murray, [name] ([id number]) [id number] Page 1 of 7 Visit Report for 10/03/2022 Arrival Information Details Patient Name: Date of Service: Danielle Murray, Danielle Murray. 10/03/2022 12:30 PM Medical Record Number: [id number] Patient Account Number: [id number] Date of Birth/Sex: Treating RN: 06-19-1941 (81 y.o. Danielle Murray Primary Care Luca Dyar: [name] Other Clinician: Referring Makendra Vigeant: Treating Nakeisha Greenhouse/Extender: Raylene Everts in Treatment: 2 Visit Information History Since Last Visit Added or deleted any medications: No Patient Arrived: Gilford Rile Any new allergies or adverse reactions: No Arrival Time: 12:43 Had a fall or experienced change in No Accompanied By: spouse activities of daily living that may affect Transfer Assistance: None risk of falls: Patient Identification Verified: Yes Signs or symptoms of abuse/neglect since last visito No Patient Requires Transmission-Based Precautions: No Hospitalized since last visit: No Patient Has Alerts: No Implantable device outside of the clinic excluding No cellular tissue based products placed in the center since last visit: Has Dressing in Place as Prescribed: Yes Pain Present Now: No Electronic Signature(s) Signed: 10/03/2022 5:13:27 PM By: Dellie Catholic RN Entered By: Dellie Catholic on 10/03/2022 12:43:47 -------------------------------------------------------------------------------- Clinic Level of Care Assessment Details Patient Name: Date of Service: [name], [name]. 10/03/2022 12:30 PM Medical Record Number: [id number] Patient Account Number: [id number] Date of Birth/Sex: Treating RN: 1940/12/23 (81 y.o. Danielle Murray Primary Care Todd Jelinski: [name] Other Clinician: Referring Jovan Schickling: Treating Jolean Madariaga/Extender: Raylene Everts in Treatment: 2 Clinic Level of Care Assessment Items TOOL 4 Quantity Score X- 1 0 Use when only an EandM  is performed on FOLLOW-UP visit ASSESSMENTS - Nursing Assessment / Reassessment X- 1 10 Reassessment of Co-morbidities (includes updates in patient status) X- 1 5 Reassessment of Adherence to Treatment Plan ASSESSMENTS - Wound and Skin A ssessment / Reassessment X - Simple Wound Assessment / Reassessment - one wound 1 5 '[]'$  - 0 Complex Wound Assessment / Reassessment - multiple wounds '[]'$  - 0 Dermatologic / Skin Assessment (not related to wound area) ASSESSMENTS - Focused Assessment '[]'$  - 0 Circumferential Edema Measurements - multi extremities '[]'$  - 0 Nutritional Assessment / Counseling / Intervention ELISABELLA, [name] ([id number]) [id number] Page 2 of 7 '[]'$  - 0 Lower Extremity Assessment (monofilament, tuning fork, pulses) '[]'$  - 0 Peripheral Arterial Disease Assessment (using hand held doppler) ASSESSMENTS - Ostomy and/or Continence Assessment and Care '[]'$  - 0 Incontinence Assessment and Management '[]'$  - 0 Ostomy Care Assessment and Management (repouching, etc.) PROCESS - Coordination of Care '[]'$  - 0 Simple Patient / Family Education for ongoing care '[]'$  - 0 Complex (extensive) Patient / Family Education for ongoing care X- 1 10 Staff obtains Programmer, systems, Records, T Results / Process Orders est X- 1 10 Staff telephones HHA, Nursing Homes / Clarify orders / etc '[]'$  - 0 Routine Transfer to another Facility (non-emergent condition) '[]'$  - 0 Routine Hospital Admission (non-emergent condition) '[]'$  - 0 New Admissions / Biomedical engineer / Ordering NPWT Apligraf, etc. , '[]'$  - 0 Emergency Hospital Admission (emergent condition) X- 1 10 Simple Discharge Coordination '[]'$  - 0 Complex (extensive) Discharge Coordination PROCESS - Special Needs '[]'$  - 0 Pediatric / Minor Patient Management '[]'$  - 0 Isolation Patient Management '[]'$  - 0 Hearing / Language / Visual special needs '[]'$  - 0 Assessment of Community assistance (transportation, D/C planning, etc.) '[]'$  -  0 Additional assistance / Altered mentation '[]'$  - 0 Support Surface(s) Assessment (bed, cushion, seat, etc.) INTERVENTIONS - Wound Cleansing / Measurement X - Simple Wound Cleansing - one wound 1 5 '[]'$  -  0 Complex Wound Cleansing - multiple wounds X- 1 5 Wound Imaging (photographs - any number of wounds) '[]'$  - 0 Wound Tracing (instead of photographs) X- 1 5 Simple Wound Measurement - one wound '[]'$  - 0 Complex Wound Measurement - multiple wounds INTERVENTIONS - Wound Dressings '[]'$  - 0 Small Wound Dressing one or multiple wounds '[]'$  - 0 Medium Wound Dressing one or multiple wounds '[]'$  - 0 Large Wound Dressing one or multiple wounds '[]'$  - 0 Application of Medications - topical '[]'$  - 0 Application of Medications - injection INTERVENTIONS - Miscellaneous '[]'$  - 0 External ear exam '[]'$  - 0 Specimen Collection (cultures, biopsies, blood, body fluids, etc.) '[]'$  - 0 Specimen(s) / Culture(s) sent or taken to Lab for analysis '[]'$  - 0 Patient Transfer (multiple staff / Civil Service fast streamer / Similar devices) '[]'$  - 0 Simple Staple / Suture removal (25 or less) '[]'$  - 0 Complex Staple / Suture removal (26 or more) '[]'$  - 0 Hypo / Hyperglycemic Management (close monitor of Blood Glucose) Dark, [name] ([id number]) [id number] Page 3 of 7 '[]'$  - 0 Ankle / Brachial Index (ABI) - do not check if billed separately X- 1 5 Vital Signs Has the patient been seen at the hospital within the last three years: Yes Total Score: 70 Level Of Care: New/Established - Level 2 Electronic Signature(s) Signed: 10/03/2022 5:13:27 PM By: Dellie Catholic RN Entered By: Dellie Catholic on 10/03/2022 17:11:23 -------------------------------------------------------------------------------- Encounter Discharge Information Details Patient Name: Date of Service: Danielle Murray NE M. 10/03/2022 12:30 PM Medical Record Number: [id number] Patient Account Number: [id number] Date of Birth/Sex: Treating  RN: February 03, 1941 (81 y.o. Danielle Murray Primary Care Espiridion Supinski: [name] Other Clinician: Referring Katiria Calame: Treating Anyiah Coverdale/Extender: Raylene Everts in Treatment: 2 Encounter Discharge Information Items Discharge Condition: Stable Ambulatory Status: Walker Discharge Destination: Home Transportation: Private Auto Accompanied By: spouse Schedule Follow-up Appointment: Yes Clinical Summary of Care: Patient Declined Electronic Signature(s) Signed: 10/03/2022 5:13:27 PM By: Dellie Catholic RN Entered By: Dellie Catholic on 10/03/2022 17:12:06 -------------------------------------------------------------------------------- Lower Extremity Assessment Details Patient Name: Date of Service: LAREE, GARRON. 10/03/2022 12:30 PM Medical Record Number: [id number] Patient Account Number: [id number] Date of Birth/Sex: Treating RN: Dec 02, 1940 (81 y.o. Danielle Murray Primary Care Evelyna Folker: [name] Other Clinician: Referring Siddiq Kaluzny: Treating Eldoris Beiser/Extender: Raylene Everts in Treatment: 2 Electronic Signature(s) Signed: 10/03/2022 5:13:27 PM By: Dellie Catholic RN Entered By: Dellie Catholic on 10/03/2022 12:50:58 -------------------------------------------------------------------------------- Multi Wound Chart Details Patient Name: Date of Service: Danielle Murray NE M. 10/03/2022 12:30 PM Medical Record Number: [id number] Patient Account Number: [id number] SONYIA, MURO ([id number]) [id number] Page 4 of 7 Date of Birth/Sex: Treating RN: November 05, 1941 (81 y.o. F) Primary Care Treyvonne Tata: Other Clinician: [name] Referring Laloni Rowton: Treating Yousof Alderman/Extender: Raylene Everts in Treatment: 2 Vital Signs Height(in): 64 Capillary Blood Glucose(mg/dl): 224 Weight(lbs): 172.4 Pulse(bpm): 49 Body Mass Index(BMI): 29.6 Blood Pressure(mmHg): 135/64 Temperature(F):  98.1 Respiratory Rate(breaths/min): 16 [1:Photos:] [N/A:N/A] Sacrum N/A N/A Wound Location: Pressure Injury N/A N/A Wounding Event: Pressure Ulcer N/A N/A Primary Etiology: Cataracts, Anemia, Angina, Coronary N/A N/A Comorbid History: Artery Disease, Hypertension, Peripheral Arterial Disease, Type II Diabetes, Rheumatoid Arthritis, Neuropathy 06/03/2022 N/A N/A Date Acquired: 2 N/A N/A Weeks of Treatment: Healed - Epithelialized N/A N/A Wound Status: No N/A N/A Wound Recurrence: 0x0x0 N/A N/A Measurements L x W x D (cm) 0 N/A N/A A (cm) : rea 0 N/A N/A Volume (cm) : 100.00% N/A N/A % Reduction in A rea: 100.00% N/A  N/A % Reduction in Volume: Category/Stage II N/A N/A Classification: Medium N/A N/A Exudate A mount: Serous N/A N/A Exudate Type: amber N/A N/A Exudate Color: None Present (0%) N/A N/A Granulation A mount: None Present (0%) N/A N/A Necrotic A mount: Fascia: No N/A N/A Exposed Structures: Fat Layer (Subcutaneous Tissue): No Tendon: No Muscle: No Joint: No Bone: No Large (67-100%) N/A N/A Epithelialization: Excoriation: No N/A N/A Periwound Skin Texture: Maceration: Yes N/A N/A Periwound Skin Moisture: No Abnormalities Noted N/A N/A Periwound Skin Color: No Abnormality N/A N/A Temperature: Treatment Notes Electronic Signature(s) Signed: 10/03/2022 1:09:38 PM By: Fredirick Maudlin MD FACS Entered By: Fredirick Maudlin on 10/03/2022 13:09:38 -------------------------------------------------------------------------------- Multi-Disciplinary Care Plan Details Patient Name: Date of Service: Danielle Murray NE M. 10/03/2022 12:30 PM Medical Record Number: [id number] Patient Account Number: [id number] Date of Birth/Sex: Treating RN: [birth year] (81 y.o. [address] [place], [place], [place] ([id number]) [id number] Page 5 of 7 Primary Care Dandrae Kustra: [name] Other Clinician: Referring Ulyssa Walthour: Treating  Tajuanna Burnett/Extender: Raylene Everts in Treatment: 2 Active Inactive Electronic Signature(s) Signed: 10/03/2022 5:13:27 PM By: Dellie Catholic RN Entered By: Dellie Catholic on 10/03/2022 17:08:26 -------------------------------------------------------------------------------- Pain Assessment Details Patient Name: Date of Service: Danielle Murray NE M. 10/03/2022 12:30 PM Medical Record Number: [id number] Patient Account Number: [id number] Date of Birth/Sex: Treating RN: January 13, 1941 (81 y.o. Danielle Murray Primary Care Abdalrahman Clementson: [name] Other Clinician: Referring Terrika Zuver: Treating Reyhan Moronta/Extender: Raylene Everts in Treatment: 2 Active Problems Location of Pain Severity and Description of Pain Patient Has Paino No Site Locations Pain Management and Medication Current Pain Management: Electronic Signature(s) Signed: 10/03/2022 5:13:27 PM By: Dellie Catholic RN Entered By: Dellie Catholic on 10/03/2022 12:50:52 -------------------------------------------------------------------------------- Patient/Caregiver Education Details Patient Name: Date of Service: Kathi Ludwig. 11/22/2023andnbsp12:30 PM Medical Record Number: [id number] Patient Account Number: [id number] Date of Birth/Gender: Treating RN: 1941-09-27 (81 y.o. Danielle Murray Primary Care Physician: [name] Other Clinician: RAKEISHA, NYCE ([id number]) [id number] Page 6 of 7 Referring Physician: Treating Physician/Extender: Raylene Everts in Treatment: 2 Education Assessment Education Provided To: Patient Education Topics Provided Wound/Skin Impairment: Methods: Explain/Verbal Responses: Return demonstration correctly Electronic Signature(s) Signed: 10/03/2022 5:13:27 PM By: Dellie Catholic RN Entered By: Dellie Catholic on 10/03/2022  17:08:43 -------------------------------------------------------------------------------- Wound Assessment Details Patient Name: Date of Service: Danielle Murray NE M. 10/03/2022 12:30 PM Medical Record Number: [id number] Patient Account Number: [id number] Date of Birth/Sex: Treating RN: Mar 18, 1941 (81 y.o. Danielle Murray Primary Care Kyl Givler: [name] Other Clinician: Referring Rashidi Loh: Treating Kotaro Buer/Extender: Raylene Everts in Treatment: 2 Wound Status Wound Number: 1 Primary Pressure Ulcer Etiology: Wound Location: Sacrum Wound Healed - Epithelialized Wounding Event: Pressure Injury Status: Date Acquired: 06/03/2022 Comorbid Cataracts, Anemia, Angina, Coronary Artery Disease, Weeks Of Treatment: 2 History: Hypertension, Peripheral Arterial Disease, Type II Diabetes, Clustered Wound: No Rheumatoid Arthritis, Neuropathy Photos Wound Measurements Length: (cm) Width: (cm) Depth: (cm) Area: (cm) Volume: (cm) 0 % Reduction in Area: 100% 0 % Reduction in Volume: 100% 0 Epithelialization: Large (67-100%) 0 Tunneling: No 0 Undermining: No Wound Description Classification: Category/Stage II Exudate Amount: Medium Exudate Type: Serous Exudate Color: amber Foul Odor After Cleansing: No Slough/Fibrino Yes Wound Bed Kenn File ([id number]) [id number] Page 7 of 7 Granulation Amount: None Present (0%) Exposed Structure Necrotic Amount: None Present (0%) Fascia Exposed: No Fat Layer (Subcutaneous Tissue) Exposed: No Tendon Exposed: No Muscle Exposed: No Joint Exposed: No Bone Exposed: No Periwound Skin Texture Texture Color No Abnormalities Noted: Yes  No Abnormalities Noted: Yes Moisture Temperature / Pain No Abnormalities Noted: Yes Temperature: No Abnormality Electronic Signature(s) Signed: 10/03/2022 5:13:27 PM By: Dellie Catholic RN Entered By: Dellie Catholic on 10/03/2022  13:06:33 -------------------------------------------------------------------------------- Vitals Details Patient Name: Date of Service: Danielle Murray NE M. 10/03/2022 12:30 PM Medical Record Number: [id number] Patient Account Number: [id number] Date of Birth/Sex: Treating RN: Jan 24, 1941 (81 y.o. Danielle Murray Primary Care Rilee Knoll: [name] Other Clinician: Referring Tiron Suski: Treating Haleigh Desmith/Extender: Raylene Everts in Treatment: 2 Vital Signs Time Taken: 12:45 Temperature (F): 98.1 Height (in): 64 Pulse (bpm): 57 Weight (lbs): 172.4 Respiratory Rate (breaths/min): 16 Body Mass Index (BMI): 29.6 Blood Pressure (mmHg): 135/64 Capillary Blood Glucose (mg/dl): 224 Reference Range: 80 - 120 mg / dl Electronic Signature(s) Signed: 10/03/2022 5:13:27 PM By: Dellie Catholic RN Entered By: Dellie Catholic on 10/03/2022 12:48:18

## 2022-10-04 NOTE — Progress Notes (Signed)
Danielle, Murray ([id number]) [id number] Page 1 of 7 Visit Report for 10/03/2022 Chief Complaint Document Details Patient Name: Date of Service: Danielle Murray, HEEG. 10/03/2022 12:30 PM Medical Record Number: [id number] Patient Account Number: [id number] Date of Birth/Sex: Treating RN: 03-Oct-1941 (81 y.o. F) Primary Care Provider: [name] Other Clinician: Referring Provider: Treating Provider/Extender: Raylene Everts in Treatment: 2 Information Obtained from: Patient Chief Complaint Patient is at the clinic for treatment of an open pressure ulcer Electronic Signature(s) Signed: 10/03/2022 1:09:43 PM By: Fredirick Maudlin MD FACS Entered By: Fredirick Maudlin on 10/03/2022 13:09:43 -------------------------------------------------------------------------------- HPI Details Patient Name: Date of Service: Danielle Murray NE M. 10/03/2022 12:30 PM Medical Record Number: [id number] Patient Account Number: [id number] Date of Birth/Sex: Treating RN: [birth year] (81 y.o. F) Primary Care Provider: [name] Other Clinician: Referring Provider: Treating Provider/Extender: Raylene Everts in Treatment: 2 History of Present Illness HPI Description: ADMISSION 09/17/2022 This is an 81 year old poorly controlled type II diabetic (last hemoglobin A1c 7.2) who fell and had an intertrochanteric fracture of her left hip in July. She developed a pressure ulcer on her sacrum while in the hospital. She was discharged to rehab and then subsequently readmitted to the hospital with what sounds like a CHF exacerbation and pneumonia. Wound ostomy nurses were consulted and they recommended Medihoney application. They have been doing this ever since her discharge apparently, and the wound is now down to just a couple of millimeters. Due to the fact that it has been present since July, however, she was referred to the wound care center for  further evaluation and management. The patient says that she does sleep on a regular mattress and is aware of the need to stay off of her sacral area is much as possible, but due to the rod in her hip, she cannot rotate from side to side particularly well. She is in a recliner much of the day and apparently has some sort of honeycomb-type foam that she sits on. She reports what sounds like better than average protein intake, including a Premier protein beverage daily. On inspection, she has a very small wound on her sacrum, measuring just a couple of millimeters in all dimensions. There is some slough and senescent periwound skin. There is some moisture-related breakdown to the periwound skin. No concern for infection. 09/24/2022: The wound is about the same size. There is still some senescent skin buildup and periwound maceration. She continues to spend most of her day seated in her recliner. 10/03/2022: Her wound is healed. The periwound skin is in much better condition. She also has her Roho cushion for her recliner. Electronic Signature(s) Signed: 10/03/2022 1:10:04 PM By: Fredirick Maudlin MD FACS Entered By: Fredirick Maudlin on 10/03/2022 13:10:04 Aydt, Danielle Murray ([id number]) [id number] Page 2 of 7 -------------------------------------------------------------------------------- Physical Exam Details Patient Name: Date of Service: Danielle Murray, HELMING. 10/03/2022 12:30 PM Medical Record Number: [id number] Patient Account Number: [id number] Date of Birth/Sex: Treating RN: 1941-04-26 (81 y.o. F) Primary Care Provider: [name] Other Clinician: Referring Provider: Treating Provider/Extender: Raylene Everts in Treatment: 2 Constitutional . Slightly bradycardic, asymptomatic. . . . No acute distress. Respiratory Normal work of breathing on room air. Notes 10/03/2022: The wound is healed. Electronic Signature(s) Signed: 10/03/2022  1:11:09 PM By: Fredirick Maudlin MD FACS Entered By: Fredirick Maudlin on 10/03/2022 13:11:08 -------------------------------------------------------------------------------- Physician Orders Details Patient Name: Date of Service: Danielle Murray NE M. 10/03/2022 12:30 PM Medical Record Number: [id number] Patient Account Number: [id number] Date  of Birth/Sex: Treating RN: [birth year] (81 y.o. Danielle Murray Primary Care Provider: [name] Other Clinician: Referring Provider: Treating Provider/Extender: Raylene Everts in Treatment: 2 Verbal / Phone Orders: No Diagnosis Coding ICD-10 Coding Code Description [phone] Pressure ulcer of sacral region, stage 2 I50.43 Acute on chronic combined systolic (congestive) and diastolic (congestive) heart failure E11.65 Type 2 diabetes mellitus with hyperglycemia E11.622 Type 2 diabetes mellitus with other skin ulcer Discharge From [facility] Services Discharge from [place] your wound is healed! Electronic Signature(s) Signed: 10/03/2022 1:11:18 PM By: Fredirick Maudlin MD FACS Entered By: Fredirick Maudlin on 10/03/2022 13:11:18 Problem List Details -------------------------------------------------------------------------------- Kenn File ([id number]) [id number] Page 3 of 7 Patient Name: Date of Service: Danielle Murray, Danielle Murray. 10/03/2022 12:30 PM Medical Record Number: [id number] Patient Account Number: [id number] Date of Birth/Sex: Treating RN: [birth year] (81 y.o. F) Primary Care Provider: [name] Other Clinician: Referring Provider: Treating Provider/Extender: Raylene Everts in Treatment: 2 Active Problems ICD-10 Encounter Code Description Active Date MDM Diagnosis L89.152 Pressure ulcer of sacral region, stage 2 09/17/2022 No Yes I50.43 Acute on chronic combined systolic (congestive) and diastolic (congestive) 39/05/6733 No Yes heart  failure E11.65 Type 2 diabetes mellitus with hyperglycemia 09/17/2022 No Yes E11.622 Type 2 diabetes mellitus with other skin ulcer 09/17/2022 No Yes Inactive Problems Resolved Problems Electronic Signature(s) Signed: 10/03/2022 1:09:32 PM By: Fredirick Maudlin MD FACS Entered By: Fredirick Maudlin on 10/03/2022 13:09:32 -------------------------------------------------------------------------------- Progress Note Details Patient Name: Date of Service: Danielle Murray NE M. 10/03/2022 12:30 PM Medical Record Number: [id number] Patient Account Number: [id number] Date of Birth/Sex: Treating RN: 11-20-1940 (81 y.o. F) Primary Care Provider: [name] Other Clinician: Referring Provider: Treating Provider/Extender: Raylene Everts in Treatment: 2 Subjective Chief Complaint Information obtained from Patient Patient is at the clinic for treatment of an open pressure ulcer History of Present Illness (HPI) ADMISSION 09/17/2022 This is an 81 year old poorly controlled type II diabetic (last hemoglobin A1c 7.2) who fell and had an intertrochanteric fracture of her left hip in July. She developed a pressure ulcer on her sacrum while in the hospital. She was discharged to rehab and then subsequently readmitted to the hospital with what sounds like a CHF exacerbation and pneumonia. Wound ostomy nurses were consulted and they recommended Medihoney application. They have been doing this ever since her discharge apparently, and the wound is now down to just a couple of millimeters. Due to the fact that it has been present since July, however, she was referred to the wound care center for further evaluation and management. The patient says that she does sleep on a regular mattress and is aware of the need to stay off of her sacral area is much as possible, but due to the rod in her hip, she cannot rotate from side to side particularly well. She is in a recliner much of the day and  apparently has some sort of honeycomb-type foam that she sits on. She reports what sounds like better than average protein intake, including a Premier protein beverage daily. On inspection, she has a very small wound on her sacrum, measuring just a couple of millimeters in all dimensions. There is some slough and senescent periwound skin. There is some moisture-related breakdown to the periwound skin. No concern for infection. AIREN, DALES ([id number]) [id number] Page 4 of 7 09/24/2022: The wound is about the same size. There is still some senescent skin buildup and periwound maceration. She continues to spend  most of her day seated in her recliner. 10/03/2022: Her wound is healed. The periwound skin is in much better condition. She also has her Roho cushion for her recliner. Patient History Information obtained from Patient. Family History Diabetes - Siblings, Heart Disease - Mother,Father,Siblings, Hypertension - Mother,Father,Siblings, Stroke - Father. Social History Never smoker, Marital Status - Married, Alcohol Use - Never, Drug Use - No History, Caffeine Use - Daily - coffee, tea. Medical History Eyes Patient has history of Cataracts Ear/Nose/Mouth/Throat Denies history of Chronic sinus problems/congestion, Middle ear problems Hematologic/Lymphatic Patient has history of Anemia Denies history of Hemophilia, Human Immunodeficiency Virus, Lymphedema, Sickle Cell Disease Respiratory Denies history of Aspiration, Asthma, Chronic Obstructive Pulmonary Disease (COPD), Pneumothorax, Sleep Apnea, Tuberculosis Cardiovascular Patient has history of Angina, Coronary Artery Disease, Hypertension, Peripheral Arterial Disease Gastrointestinal Denies history of Cirrhosis , Colitis, Crohnoos, Hepatitis A, Hepatitis B, Hepatitis C Endocrine Patient has history of Type II Diabetes Immunological Denies history of Lupus Erythematosus, Raynaudoos,  Scleroderma Integumentary (Skin) Denies history of History of Burn Musculoskeletal Patient has history of Rheumatoid Arthritis Neurologic Patient has history of Neuropathy Oncologic Denies history of Received Chemotherapy, Received Radiation Psychiatric Denies history of Anorexia/bulimia, Confinement Anxiety Hospitalization/Surgery History - 06/25/22- coronary stent intervention. - 8/23- R/L heart cath and coronary angiography. - 05/2013- Femur IM nailing (left). - 08/2013- Left heart cath with coronary angiogram. - 06/2013- cataract extraction. - 06/2005- T knee arthroplasty. - 1989- abd hysterectomy. otal Medical A Surgical History Notes nd Eyes diabetic retinopathy bilateral eyes Gastrointestinal Diverticulosis Endocrine Hypothyroidism Psychiatric Depression Objective Constitutional Slightly bradycardic, asymptomatic. No acute distress. Vitals Time Taken: 12:45 PM, Height: 64 in, Weight: 172.4 lbs, BMI: 29.6, Temperature: 98.1 F, Pulse: 57 bpm, Respiratory Rate: 16 breaths/min, Blood Pressure: 135/64 mmHg, Capillary Blood Glucose: 224 mg/dl. Respiratory Normal work of breathing on room air. General Notes: 10/03/2022: The wound is healed. Integumentary (Hair, Skin) Wound #1 status is Healed - Epithelialized. Original cause of wound was Pressure Injury. The date acquired was: 06/03/2022. The wound has been in treatment 2 weeks. The wound is located on the Sacrum. The wound measures 0cm length x 0cm width x 0cm depth; 0cm^2 area and 0cm^3 volume. There is no tunneling or undermining noted. There is a medium amount of serous drainage noted. There is no granulation within the wound bed. There is no necrotic tissue within the wound bed. The periwound skin appearance had no abnormalities noted for texture. The periwound skin appearance had no abnormalities noted for moisture. The periwound skin appearance had no abnormalities noted for color. Periwound temperature was noted as No  Abnormality. Danielle Murray, Danielle Murray ([id number]) [id number] Page 5 of 7 Assessment Active Problems ICD-10 Pressure ulcer of sacral region, stage 2 Acute on chronic combined systolic (congestive) and diastolic (congestive) heart failure Type 2 diabetes mellitus with hyperglycemia Type 2 diabetes mellitus with other skin ulcer Plan Discharge From [facility] Services: Discharge from [place] your wound is healed! 10/03/2022: Her wound is healed. I did recommend that they continue to protect her skin and her natal cleft by applying zinc oxide to keep it down. She will also continue to use a cushion when she is sitting in the clinic. She will be discharged from care center and may follow-up as needed. Electronic Signature(s) Signed: 10/03/2022 1:12:03 PM By: Fredirick Maudlin MD FACS Entered By: Fredirick Maudlin on 10/03/2022 13:12:03 -------------------------------------------------------------------------------- HxROS Details Patient Name: Date of Service: Danielle Murray NE M. 10/03/2022 12:30 PM Medical Record Number: [id number] Patient Account Number: [id number]  Date of Birth/Sex: Treating RN: 1941-02-20 (81 y.o. F) Primary Care Provider: [name] Other Clinician: Referring Provider: Treating Provider/Extender: Raylene Everts in Treatment: 2 Information Obtained From Patient Eyes Medical History: Positive for: Cataracts Past Medical History Notes: diabetic retinopathy bilateral eyes Ear/Nose/Mouth/Throat Medical History: Negative for: Chronic sinus problems/congestion; Middle ear problems Hematologic/Lymphatic Medical History: Positive for: Anemia Negative for: Hemophilia; Human Immunodeficiency Virus; Lymphedema; Sickle Cell Disease Respiratory Medical History: Negative for: Aspiration; Asthma; Chronic Obstructive Pulmonary Disease (COPD); Pneumothorax; Sleep Apnea; Tuberculosis Cardiovascular Medical  History: Positive for: Angina; Coronary Artery Disease; Hypertension; Peripheral Arterial Disease [place], [place] Danielle Murray ([id number]) [phone].pdf Page 6 of 7 Gastrointestinal Medical History: Negative for: Cirrhosis ; Colitis; Crohns; Hepatitis A; Hepatitis B; Hepatitis C Past Medical History Notes: Diverticulosis Endocrine Medical History: Positive for: Type II Diabetes Past Medical History Notes: Hypothyroidism Time with diabetes: over 53 years Treated with: Insulin, Oral agents Blood sugar tested every day: Yes Tested : 2-3 per day Immunological Medical History: Negative for: Lupus Erythematosus; Raynauds; Scleroderma Integumentary (Skin) Medical History: Negative for: History of Burn Musculoskeletal Medical History: Positive for: Rheumatoid Arthritis Neurologic Medical History: Positive for: Neuropathy Oncologic Medical History: Negative for: Received Chemotherapy; Received Radiation Psychiatric Medical History: Negative for: Anorexia/bulimia; Confinement Anxiety Past Medical History Notes: Depression HBO Extended History Items Eyes: Cataracts Immunizations Pneumococcal Vaccine: Received Pneumococcal Vaccination: Yes Received Pneumococcal Vaccination On or After 60th Birthday: Yes Implantable Devices No devices added Hospitalization / Surgery History Type of Hospitalization/Surgery 06/25/22- coronary stent intervention 8/23- R/L heart cath and coronary angiography 05/2013- Femur IM nailing (left) 08/2013- Left heart cath with coronary angiogram 06/2013- cataract extraction 06/2005- T knee arthroplasty otal 1989- abd hysterectomy Family and Social History Diabetes: Yes - Siblings; Heart Disease: Yes - Mother,Father,Siblings; Hypertension: Yes - Mother,Father,Siblings; Stroke: Yes - Father; Never smoker; Marital Status - Married; Alcohol Use: Never; Drug Use: No History; Caffeine Use: Daily - coffee, tea; Financial Concerns: No; Food,  Clothing or Shelter Needs: No; Support System Lacking: No; Transportation Concerns: No BRETTA, FEES ([id number]) [id number] Page 7 of 7 Electronic Signature(s) Signed: 10/03/2022 5:15:30 PM By: Fredirick Maudlin MD FACS Entered By: Fredirick Maudlin on 10/03/2022 13:10:32 -------------------------------------------------------------------------------- SuperBill Details Patient Name: Date of Service: Danielle Murray NE M. 10/03/2022 Medical Record Number: [id number] Patient Account Number: [id number] Date of Birth/Sex: Treating RN: 20-Aug-1941 (81 y.o. F) Primary Care Provider: [name] Other Clinician: Referring Provider: Treating Provider/Extender: Raylene Everts in Treatment: 2 Diagnosis Coding ICD-10 Codes Code Description [phone] Pressure ulcer of sacral region, stage 2 I50.43 Acute on chronic combined systolic (congestive) and diastolic (congestive) heart failure E11.65 Type 2 diabetes mellitus with hyperglycemia E11.622 Type 2 diabetes mellitus with other skin ulcer Physician Procedures : CPT4 Code Description Modifier [id number] - WC PHYS LEVEL 3 - EST PT ICD-10 Diagnosis Description L89.152 Pressure ulcer of sacral region, stage 2 I50.43 Acute on chronic combined systolic (congestive) and diastolic (congestive) heart failure E11.65  Type 2 diabetes mellitus with hyperglycemia E11.622 Type 2 diabetes mellitus with other skin ulcer Quantity: 1 Electronic Signature(s) Signed: 10/03/2022 1:12:16 PM By: Fredirick Maudlin MD FACS Entered By: Fredirick Maudlin on 10/03/2022 13:12:16

## 2022-10-08 DIAGNOSIS — E119 Type 2 diabetes mellitus without complications: Secondary | ICD-10-CM | POA: Diagnosis not present

## 2022-10-08 DIAGNOSIS — I2510 Atherosclerotic heart disease of native coronary artery without angina pectoris: Secondary | ICD-10-CM | POA: Diagnosis not present

## 2022-10-08 DIAGNOSIS — E785 Hyperlipidemia, unspecified: Secondary | ICD-10-CM | POA: Diagnosis not present

## 2022-10-08 DIAGNOSIS — I252 Old myocardial infarction: Secondary | ICD-10-CM | POA: Diagnosis not present

## 2022-10-08 DIAGNOSIS — Z794 Long term (current) use of insulin: Secondary | ICD-10-CM | POA: Diagnosis not present

## 2022-10-08 DIAGNOSIS — Z8701 Personal history of pneumonia (recurrent): Secondary | ICD-10-CM | POA: Diagnosis not present

## 2022-10-08 DIAGNOSIS — I110 Hypertensive heart disease with heart failure: Secondary | ICD-10-CM | POA: Diagnosis not present

## 2022-10-08 DIAGNOSIS — E039 Hypothyroidism, unspecified: Secondary | ICD-10-CM | POA: Diagnosis not present

## 2022-10-08 DIAGNOSIS — S72142D Displaced intertrochanteric fracture of left femur, subsequent encounter for closed fracture with routine healing: Secondary | ICD-10-CM | POA: Diagnosis not present

## 2022-10-08 DIAGNOSIS — Z9181 History of falling: Secondary | ICD-10-CM | POA: Diagnosis not present

## 2022-10-08 DIAGNOSIS — Z79899 Other long term (current) drug therapy: Secondary | ICD-10-CM | POA: Diagnosis not present

## 2022-10-08 DIAGNOSIS — D500 Iron deficiency anemia secondary to blood loss (chronic): Secondary | ICD-10-CM | POA: Diagnosis not present

## 2022-10-08 DIAGNOSIS — Z7984 Long term (current) use of oral hypoglycemic drugs: Secondary | ICD-10-CM | POA: Diagnosis not present

## 2022-10-08 DIAGNOSIS — L89312 Pressure ulcer of right buttock, stage 2: Secondary | ICD-10-CM | POA: Diagnosis not present

## 2022-10-08 DIAGNOSIS — Z955 Presence of coronary angioplasty implant and graft: Secondary | ICD-10-CM | POA: Diagnosis not present

## 2022-10-08 DIAGNOSIS — I5021 Acute systolic (congestive) heart failure: Secondary | ICD-10-CM | POA: Diagnosis not present

## 2022-10-08 DIAGNOSIS — D649 Anemia, unspecified: Secondary | ICD-10-CM | POA: Diagnosis not present

## 2022-10-09 DIAGNOSIS — D509 Iron deficiency anemia, unspecified: Secondary | ICD-10-CM | POA: Diagnosis not present

## 2022-10-09 DIAGNOSIS — L89302 Pressure ulcer of unspecified buttock, stage 2: Secondary | ICD-10-CM | POA: Diagnosis not present

## 2022-10-16 DIAGNOSIS — E119 Type 2 diabetes mellitus without complications: Secondary | ICD-10-CM | POA: Diagnosis not present

## 2022-10-16 DIAGNOSIS — Z79899 Other long term (current) drug therapy: Secondary | ICD-10-CM | POA: Diagnosis not present

## 2022-10-16 DIAGNOSIS — Z8701 Personal history of pneumonia (recurrent): Secondary | ICD-10-CM | POA: Diagnosis not present

## 2022-10-16 DIAGNOSIS — I110 Hypertensive heart disease with heart failure: Secondary | ICD-10-CM | POA: Diagnosis not present

## 2022-10-16 DIAGNOSIS — Z794 Long term (current) use of insulin: Secondary | ICD-10-CM | POA: Diagnosis not present

## 2022-10-16 DIAGNOSIS — Z7984 Long term (current) use of oral hypoglycemic drugs: Secondary | ICD-10-CM | POA: Diagnosis not present

## 2022-10-16 DIAGNOSIS — I252 Old myocardial infarction: Secondary | ICD-10-CM | POA: Diagnosis not present

## 2022-10-16 DIAGNOSIS — E039 Hypothyroidism, unspecified: Secondary | ICD-10-CM | POA: Diagnosis not present

## 2022-10-16 DIAGNOSIS — Z9181 History of falling: Secondary | ICD-10-CM | POA: Diagnosis not present

## 2022-10-16 DIAGNOSIS — I2510 Atherosclerotic heart disease of native coronary artery without angina pectoris: Secondary | ICD-10-CM | POA: Diagnosis not present

## 2022-10-16 DIAGNOSIS — S72142D Displaced intertrochanteric fracture of left femur, subsequent encounter for closed fracture with routine healing: Secondary | ICD-10-CM | POA: Diagnosis not present

## 2022-10-16 DIAGNOSIS — E785 Hyperlipidemia, unspecified: Secondary | ICD-10-CM | POA: Diagnosis not present

## 2022-10-16 DIAGNOSIS — D500 Iron deficiency anemia secondary to blood loss (chronic): Secondary | ICD-10-CM | POA: Diagnosis not present

## 2022-10-16 DIAGNOSIS — I5021 Acute systolic (congestive) heart failure: Secondary | ICD-10-CM | POA: Diagnosis not present

## 2022-10-16 DIAGNOSIS — L89312 Pressure ulcer of right buttock, stage 2: Secondary | ICD-10-CM | POA: Diagnosis not present

## 2022-10-16 DIAGNOSIS — Z955 Presence of coronary angioplasty implant and graft: Secondary | ICD-10-CM | POA: Diagnosis not present

## 2022-10-18 DIAGNOSIS — S72142D Displaced intertrochanteric fracture of left femur, subsequent encounter for closed fracture with routine healing: Secondary | ICD-10-CM | POA: Diagnosis not present

## 2022-10-22 DIAGNOSIS — Z955 Presence of coronary angioplasty implant and graft: Secondary | ICD-10-CM | POA: Diagnosis not present

## 2022-10-22 DIAGNOSIS — L89312 Pressure ulcer of right buttock, stage 2: Secondary | ICD-10-CM | POA: Diagnosis not present

## 2022-10-22 DIAGNOSIS — E785 Hyperlipidemia, unspecified: Secondary | ICD-10-CM | POA: Diagnosis not present

## 2022-10-22 DIAGNOSIS — S72142D Displaced intertrochanteric fracture of left femur, subsequent encounter for closed fracture with routine healing: Secondary | ICD-10-CM | POA: Diagnosis not present

## 2022-10-22 DIAGNOSIS — Z8701 Personal history of pneumonia (recurrent): Secondary | ICD-10-CM | POA: Diagnosis not present

## 2022-10-22 DIAGNOSIS — I5021 Acute systolic (congestive) heart failure: Secondary | ICD-10-CM | POA: Diagnosis not present

## 2022-10-22 DIAGNOSIS — I252 Old myocardial infarction: Secondary | ICD-10-CM | POA: Diagnosis not present

## 2022-10-22 DIAGNOSIS — Z79899 Other long term (current) drug therapy: Secondary | ICD-10-CM | POA: Diagnosis not present

## 2022-10-22 DIAGNOSIS — Z9181 History of falling: Secondary | ICD-10-CM | POA: Diagnosis not present

## 2022-10-22 DIAGNOSIS — Z794 Long term (current) use of insulin: Secondary | ICD-10-CM | POA: Diagnosis not present

## 2022-10-22 DIAGNOSIS — D500 Iron deficiency anemia secondary to blood loss (chronic): Secondary | ICD-10-CM | POA: Diagnosis not present

## 2022-10-22 DIAGNOSIS — I2510 Atherosclerotic heart disease of native coronary artery without angina pectoris: Secondary | ICD-10-CM | POA: Diagnosis not present

## 2022-10-22 DIAGNOSIS — E039 Hypothyroidism, unspecified: Secondary | ICD-10-CM | POA: Diagnosis not present

## 2022-10-22 DIAGNOSIS — Z7984 Long term (current) use of oral hypoglycemic drugs: Secondary | ICD-10-CM | POA: Diagnosis not present

## 2022-10-22 DIAGNOSIS — I110 Hypertensive heart disease with heart failure: Secondary | ICD-10-CM | POA: Diagnosis not present

## 2022-10-22 DIAGNOSIS — E119 Type 2 diabetes mellitus without complications: Secondary | ICD-10-CM | POA: Diagnosis not present

## 2022-10-23 NOTE — Progress Notes (Signed)
Patient ID: Danielle Murray, female   DOB: [birth year], 81 y.o.   MRN: [id number]      81 y.o. f/u CAD, HTN, HLD, IDDM.  08/2013 stent to LAD and medical Rx for small left sided PDA. Low risk myovue in June 2018 She is a twin and her sister died in 76.  Sees [facility] for her DM.Marland Kitchen Some LE edema dependant  TTE 11/09/20 EF 34-28% grade 2 diastolic no valve disese   Leg cramps better on crestor rather than lipitor   Seen by Dr [name] in October 2021 stable although she indicated previously she wanted to f/u with someone else ABI 01/14/20 right 0.96 and left 0.83   06/19/22 Admitted with hip fracture and developed fever, pneumonia volume overload and SEMI In post op period TTE with EF 35-40% Cath with LAD stent patent jailed D1 with 70% ostial LCX Diuresed as right heart pressures elevated PA 61/21 mmHg PCWP 26 mmHg Returned 06/25/22 for shockwave assisted stent to ostial LCX left dominant  F/U TTE 09/25/22 EF 60-65% with normal RV and mild MR  Post d/c had palliative care f/u and SNF with rehab  Doing great Walking with walker Decubitus healed Got good report on her hip this week  ROS: Denies fever, malais, weight loss, blurry vision, decreased visual acuity, cough, sputum, SOB, hemoptysis, pleuritic pain, palpitaitons, heartburn, abdominal pain, melena, lower extremity edema, claudication, or rash.  All other systems reviewed and negative  General: BP (!) 148/58   Pulse (!) 56   Ht '5\' 4"'$  (1.626 m)   Wt 166 lb (75.3 kg)   SpO2 98%   BMI 28.49 kg/m  Affect appropriate Healthy:  appears stated age HEENT: normal Neck supple with no adenopathy JVP normal no bruits no thyromegaly Lungs clear with no wheezing and good diaphragmatic motion Heart:  S1/S2 no murmur, no rub, gallop or click PMI normal Abdomen: benighn, BS positve, no tenderness, no AAA no bruit.  No HSM or HJR Distal pulses intact with no bruits Trace bilateral edema Neuro non-focal Skin warm and dry No muscular weakness   Current  Outpatient Medications  Medication Sig Dispense Refill   acetaminophen (TYLENOL) 325 MG tablet Take 2 tablets (650 mg total) by mouth every 4 (four) hours as needed for headache or mild pain.     aspirin 81 MG chewable tablet Chew 1 tablet (81 mg total) by mouth daily.     carvedilol (COREG) 6.25 MG tablet TAKE 1 TABLET BY MOUTH TWICE DAILY WITH A MEAL 180 tablet 1   Cholecalciferol (VITAMIN D3 PO) Take 1 tablet by mouth daily.     clopidogrel (PLAVIX) 75 MG tablet Take 1 tablet by mouth once daily 90 tablet 0   Continuous Blood Gluc Sensor (FREESTYLE LIBRE 2 SENSOR) MISC APPLY AS DIRECTED EVERY 14 DAYS     empagliflozin (JARDIANCE) 10 MG TABS tablet Take 1 tablet (10 mg total) by mouth daily. 90 tablet 3   Ferrous Sulfate (IRON) 325 (65 Fe) MG TABS Take 65 mg by mouth every other day.     furosemide (LASIX) 40 MG tablet Take 1 tablet (40 mg total) by mouth daily. 90 tablet 3   isosorbide mononitrate (IMDUR) 30 MG 24 hr tablet Take 1 tablet by mouth once daily 90 tablet 3   levothyroxine (SYNTHROID, LEVOTHROID) 88 MCG tablet Take 88 mcg by mouth at bedtime.      losartan (COZAAR) 25 MG tablet Take 0.5 tablets (12.5 mg total) by mouth daily. 30 tablet 0  Menthol, Topical Analgesic, (BIOFREEZE ROLL-ON) 4 % GEL Apply topically as needed.     nitroGLYCERIN (NITROSTAT) 0.4 MG SL tablet Place 1 tablet (0.4 mg total) under the tongue every 5 (five) minutes as needed for chest pain (3 doses max). 25 tablet 3   NOVOLIN 70/30 KWIKPEN (70-30) 100 UNIT/ML KwikPen Inject 20 Units into the skin in the morning and at bedtime. Take 30 units in the morning and Take 50 units at bedtime (Patient taking differently: Inject 20 Units into the skin daily at 6 (six) AM. Take 30 units in the morning)     Probiotic Product (PROBIOTIC PO) Take 1 tablet by mouth daily.     rosuvastatin (CRESTOR) 5 MG tablet Take 1 tablet (5 mg total) by mouth daily. (Patient taking differently: Take 10 mg by mouth daily.) 90 tablet 3    spironolactone (ALDACTONE) 25 MG tablet Take 0.5 tablets (12.5 mg total) by mouth daily. 45 tablet 3   No current facility-administered medications for this visit.    Allergies  Penicillins  Electrocardiogram:  09/16/19  SR rate 61 low voltage no changes 10/30/2022 SR rate 67 LAD low voltage no acute changes   Assessment and Plan CAD: cath 06/21/22 with patent stent LAD New stent to ostial LCX 06/25/22 with improvement in EF continue DAT and medical Rx DM: Discussed low carb diet.  Target hemoglobin A1c is 6.5 or less.  Continue current medications. F/U with [facility] to adjust insulin Sees Dr Zadie Rhine for diabetic eye exams  HTN: Well controlled.  Continue current medications and low sodium Dash type diet.   Thyroid  Continue replacement sees Balin for labs  Edema:  Continue diuretics low sodium diet and elevation with support hose  Cholesterol:  Continue statin labs with primary  PVD:  ABI reduced on left 0.83  March 2021 f/u Dr Myrene Galas no claudication no need to repeat ABIs now Dyspnea:  TTE  09/25/22 EF 60-65% post intervention with mild MR and normal RV continue aldactone filling pressures high prior to intervention  Ortho:  left hip fracture  post repair July and sacral decubitus f/u wound center    F/u  Dr [name] and me in a year   Jenkins Rouge

## 2022-10-29 DIAGNOSIS — E119 Type 2 diabetes mellitus without complications: Secondary | ICD-10-CM | POA: Diagnosis not present

## 2022-10-29 DIAGNOSIS — I252 Old myocardial infarction: Secondary | ICD-10-CM | POA: Diagnosis not present

## 2022-10-29 DIAGNOSIS — Z8701 Personal history of pneumonia (recurrent): Secondary | ICD-10-CM | POA: Diagnosis not present

## 2022-10-29 DIAGNOSIS — Z9181 History of falling: Secondary | ICD-10-CM | POA: Diagnosis not present

## 2022-10-29 DIAGNOSIS — E785 Hyperlipidemia, unspecified: Secondary | ICD-10-CM | POA: Diagnosis not present

## 2022-10-29 DIAGNOSIS — I110 Hypertensive heart disease with heart failure: Secondary | ICD-10-CM | POA: Diagnosis not present

## 2022-10-29 DIAGNOSIS — D500 Iron deficiency anemia secondary to blood loss (chronic): Secondary | ICD-10-CM | POA: Diagnosis not present

## 2022-10-29 DIAGNOSIS — L89312 Pressure ulcer of right buttock, stage 2: Secondary | ICD-10-CM | POA: Diagnosis not present

## 2022-10-29 DIAGNOSIS — E039 Hypothyroidism, unspecified: Secondary | ICD-10-CM | POA: Diagnosis not present

## 2022-10-29 DIAGNOSIS — I2510 Atherosclerotic heart disease of native coronary artery without angina pectoris: Secondary | ICD-10-CM | POA: Diagnosis not present

## 2022-10-29 DIAGNOSIS — S72142D Displaced intertrochanteric fracture of left femur, subsequent encounter for closed fracture with routine healing: Secondary | ICD-10-CM | POA: Diagnosis not present

## 2022-10-29 DIAGNOSIS — Z79899 Other long term (current) drug therapy: Secondary | ICD-10-CM | POA: Diagnosis not present

## 2022-10-29 DIAGNOSIS — Z7984 Long term (current) use of oral hypoglycemic drugs: Secondary | ICD-10-CM | POA: Diagnosis not present

## 2022-10-29 DIAGNOSIS — I5021 Acute systolic (congestive) heart failure: Secondary | ICD-10-CM | POA: Diagnosis not present

## 2022-10-29 DIAGNOSIS — Z794 Long term (current) use of insulin: Secondary | ICD-10-CM | POA: Diagnosis not present

## 2022-10-29 DIAGNOSIS — Z955 Presence of coronary angioplasty implant and graft: Secondary | ICD-10-CM | POA: Diagnosis not present

## 2022-10-30 ENCOUNTER — Encounter: Payer: Self-pay | Admitting: Cardiovascular Disease

## 2022-10-30 ENCOUNTER — Ambulatory Visit: Payer: PPO | Attending: Cardiovascular Disease | Admitting: Cardiovascular Disease

## 2022-10-30 VITALS — BP 148/58 | HR 56 | Ht 64.0 in | Wt 166.0 lb

## 2022-10-30 DIAGNOSIS — E785 Hyperlipidemia, unspecified: Secondary | ICD-10-CM

## 2022-10-30 DIAGNOSIS — I739 Peripheral vascular disease, unspecified: Secondary | ICD-10-CM

## 2022-10-30 DIAGNOSIS — I10 Essential (primary) hypertension: Secondary | ICD-10-CM

## 2022-10-30 MED ORDER — CARVEDILOL 6.25 MG PO TABS: 6.2500 mg | ORAL_TABLET | Freq: Two times a day (BID) | ORAL | 1 refills | Status: DC

## 2022-10-30 MED ORDER — LOSARTAN POTASSIUM 25 MG PO TABS: 12.5000 mg | ORAL_TABLET | Freq: Every day | ORAL | 1 refills | Status: DC

## 2022-10-30 NOTE — Patient Instructions (Signed)
Medication Instructions:  Your physician recommends that you continue on your current medications as directed. Please refer to the Current Medication list given to you today.  *If you need a refill on your cardiac medications before your next appointment, please call your pharmacy*  Lab Work: If you have labs (blood work) drawn today and your tests are completely normal, you will receive your results only by: [place] (if you have MyChart) OR A paper copy in the mail If you have any lab test that is abnormal or we need to change your treatment, we will call you to review the results.  Testing/Procedures: None ordered today.  Follow-Up: At [facility], you and your health needs are our priority.  As part of our continuing mission to provide you with exceptional heart care, we have created designated Provider Care Teams.  These Care Teams include your primary Cardiologist (physician) and Advanced Practice Providers (APPs -  Physician Assistants and Nurse Practitioners) who all work together to provide you with the care you need, when you need it.  We recommend signing up for the patient portal called "MyChart".  Sign up information is provided on this After Visit Summary.  MyChart is used to connect with patients for Virtual Visits (Telemedicine).  Patients are able to view lab/test results, encounter notes, upcoming appointments, etc.  Non-urgent messages can be sent to your provider as well.   To learn more about what you can do with MyChart, go to [email or web address].    Your next appointment:   3 month(s)  The format for your next appointment:   In Person  Provider:   Dr. [name]    Your physician recommends that you schedule a follow-up appointment in: 7 months with Dr. Johnsie Cancel  Important Information About Sugar

## 2022-11-07 DIAGNOSIS — Z955 Presence of coronary angioplasty implant and graft: Secondary | ICD-10-CM | POA: Diagnosis not present

## 2022-11-07 DIAGNOSIS — E119 Type 2 diabetes mellitus without complications: Secondary | ICD-10-CM | POA: Diagnosis not present

## 2022-11-07 DIAGNOSIS — I110 Hypertensive heart disease with heart failure: Secondary | ICD-10-CM | POA: Diagnosis not present

## 2022-11-07 DIAGNOSIS — Z8701 Personal history of pneumonia (recurrent): Secondary | ICD-10-CM | POA: Diagnosis not present

## 2022-11-07 DIAGNOSIS — D500 Iron deficiency anemia secondary to blood loss (chronic): Secondary | ICD-10-CM | POA: Diagnosis not present

## 2022-11-07 DIAGNOSIS — S72142D Displaced intertrochanteric fracture of left femur, subsequent encounter for closed fracture with routine healing: Secondary | ICD-10-CM | POA: Diagnosis not present

## 2022-11-07 DIAGNOSIS — Z9181 History of falling: Secondary | ICD-10-CM | POA: Diagnosis not present

## 2022-11-07 DIAGNOSIS — E785 Hyperlipidemia, unspecified: Secondary | ICD-10-CM | POA: Diagnosis not present

## 2022-11-07 DIAGNOSIS — I2510 Atherosclerotic heart disease of native coronary artery without angina pectoris: Secondary | ICD-10-CM | POA: Diagnosis not present

## 2022-11-07 DIAGNOSIS — Z7984 Long term (current) use of oral hypoglycemic drugs: Secondary | ICD-10-CM | POA: Diagnosis not present

## 2022-11-07 DIAGNOSIS — I252 Old myocardial infarction: Secondary | ICD-10-CM | POA: Diagnosis not present

## 2022-11-07 DIAGNOSIS — L89312 Pressure ulcer of right buttock, stage 2: Secondary | ICD-10-CM | POA: Diagnosis not present

## 2022-11-07 DIAGNOSIS — I5021 Acute systolic (congestive) heart failure: Secondary | ICD-10-CM | POA: Diagnosis not present

## 2022-11-07 DIAGNOSIS — E039 Hypothyroidism, unspecified: Secondary | ICD-10-CM | POA: Diagnosis not present

## 2022-11-07 DIAGNOSIS — Z79899 Other long term (current) drug therapy: Secondary | ICD-10-CM | POA: Diagnosis not present

## 2022-11-07 DIAGNOSIS — Z794 Long term (current) use of insulin: Secondary | ICD-10-CM | POA: Diagnosis not present

## 2022-11-09 DIAGNOSIS — I5021 Acute systolic (congestive) heart failure: Secondary | ICD-10-CM | POA: Diagnosis not present

## 2022-11-09 DIAGNOSIS — E119 Type 2 diabetes mellitus without complications: Secondary | ICD-10-CM | POA: Diagnosis not present

## 2022-11-09 DIAGNOSIS — D500 Iron deficiency anemia secondary to blood loss (chronic): Secondary | ICD-10-CM | POA: Diagnosis not present

## 2022-11-09 DIAGNOSIS — Z8701 Personal history of pneumonia (recurrent): Secondary | ICD-10-CM | POA: Diagnosis not present

## 2022-11-09 DIAGNOSIS — L89312 Pressure ulcer of right buttock, stage 2: Secondary | ICD-10-CM | POA: Diagnosis not present

## 2022-11-09 DIAGNOSIS — Z7984 Long term (current) use of oral hypoglycemic drugs: Secondary | ICD-10-CM | POA: Diagnosis not present

## 2022-11-09 DIAGNOSIS — Z955 Presence of coronary angioplasty implant and graft: Secondary | ICD-10-CM | POA: Diagnosis not present

## 2022-11-09 DIAGNOSIS — Z79899 Other long term (current) drug therapy: Secondary | ICD-10-CM | POA: Diagnosis not present

## 2022-11-09 DIAGNOSIS — E039 Hypothyroidism, unspecified: Secondary | ICD-10-CM | POA: Diagnosis not present

## 2022-11-09 DIAGNOSIS — I110 Hypertensive heart disease with heart failure: Secondary | ICD-10-CM | POA: Diagnosis not present

## 2022-11-09 DIAGNOSIS — I252 Old myocardial infarction: Secondary | ICD-10-CM | POA: Diagnosis not present

## 2022-11-09 DIAGNOSIS — E785 Hyperlipidemia, unspecified: Secondary | ICD-10-CM | POA: Diagnosis not present

## 2022-11-09 DIAGNOSIS — I2510 Atherosclerotic heart disease of native coronary artery without angina pectoris: Secondary | ICD-10-CM | POA: Diagnosis not present

## 2022-11-09 DIAGNOSIS — S72142D Displaced intertrochanteric fracture of left femur, subsequent encounter for closed fracture with routine healing: Secondary | ICD-10-CM | POA: Diagnosis not present

## 2022-11-09 DIAGNOSIS — Z9181 History of falling: Secondary | ICD-10-CM | POA: Diagnosis not present

## 2022-11-09 DIAGNOSIS — Z794 Long term (current) use of insulin: Secondary | ICD-10-CM | POA: Diagnosis not present

## 2022-11-14 DIAGNOSIS — I2510 Atherosclerotic heart disease of native coronary artery without angina pectoris: Secondary | ICD-10-CM | POA: Diagnosis not present

## 2022-11-14 DIAGNOSIS — E039 Hypothyroidism, unspecified: Secondary | ICD-10-CM | POA: Diagnosis not present

## 2022-11-14 DIAGNOSIS — Z7984 Long term (current) use of oral hypoglycemic drugs: Secondary | ICD-10-CM | POA: Diagnosis not present

## 2022-11-14 DIAGNOSIS — Z8701 Personal history of pneumonia (recurrent): Secondary | ICD-10-CM | POA: Diagnosis not present

## 2022-11-14 DIAGNOSIS — Z9181 History of falling: Secondary | ICD-10-CM | POA: Diagnosis not present

## 2022-11-14 DIAGNOSIS — D500 Iron deficiency anemia secondary to blood loss (chronic): Secondary | ICD-10-CM | POA: Diagnosis not present

## 2022-11-14 DIAGNOSIS — I5021 Acute systolic (congestive) heart failure: Secondary | ICD-10-CM | POA: Diagnosis not present

## 2022-11-14 DIAGNOSIS — S72142D Displaced intertrochanteric fracture of left femur, subsequent encounter for closed fracture with routine healing: Secondary | ICD-10-CM | POA: Diagnosis not present

## 2022-11-14 DIAGNOSIS — E119 Type 2 diabetes mellitus without complications: Secondary | ICD-10-CM | POA: Diagnosis not present

## 2022-11-14 DIAGNOSIS — L89312 Pressure ulcer of right buttock, stage 2: Secondary | ICD-10-CM | POA: Diagnosis not present

## 2022-11-14 DIAGNOSIS — Z794 Long term (current) use of insulin: Secondary | ICD-10-CM | POA: Diagnosis not present

## 2022-11-14 DIAGNOSIS — E785 Hyperlipidemia, unspecified: Secondary | ICD-10-CM | POA: Diagnosis not present

## 2022-11-14 DIAGNOSIS — I252 Old myocardial infarction: Secondary | ICD-10-CM | POA: Diagnosis not present

## 2022-11-14 DIAGNOSIS — Z79899 Other long term (current) drug therapy: Secondary | ICD-10-CM | POA: Diagnosis not present

## 2022-11-14 DIAGNOSIS — Z955 Presence of coronary angioplasty implant and graft: Secondary | ICD-10-CM | POA: Diagnosis not present

## 2022-11-14 DIAGNOSIS — I110 Hypertensive heart disease with heart failure: Secondary | ICD-10-CM | POA: Diagnosis not present

## 2022-11-15 DIAGNOSIS — E785 Hyperlipidemia, unspecified: Secondary | ICD-10-CM | POA: Diagnosis not present

## 2022-11-15 DIAGNOSIS — E119 Type 2 diabetes mellitus without complications: Secondary | ICD-10-CM | POA: Diagnosis not present

## 2022-11-15 DIAGNOSIS — E039 Hypothyroidism, unspecified: Secondary | ICD-10-CM | POA: Diagnosis not present

## 2022-11-15 DIAGNOSIS — I110 Hypertensive heart disease with heart failure: Secondary | ICD-10-CM | POA: Diagnosis not present

## 2022-11-15 DIAGNOSIS — I252 Old myocardial infarction: Secondary | ICD-10-CM | POA: Diagnosis not present

## 2022-11-15 DIAGNOSIS — Z7984 Long term (current) use of oral hypoglycemic drugs: Secondary | ICD-10-CM | POA: Diagnosis not present

## 2022-11-15 DIAGNOSIS — I5021 Acute systolic (congestive) heart failure: Secondary | ICD-10-CM | POA: Diagnosis not present

## 2022-11-15 DIAGNOSIS — Z955 Presence of coronary angioplasty implant and graft: Secondary | ICD-10-CM | POA: Diagnosis not present

## 2022-11-15 DIAGNOSIS — D500 Iron deficiency anemia secondary to blood loss (chronic): Secondary | ICD-10-CM | POA: Diagnosis not present

## 2022-11-15 DIAGNOSIS — S72142D Displaced intertrochanteric fracture of left femur, subsequent encounter for closed fracture with routine healing: Secondary | ICD-10-CM | POA: Diagnosis not present

## 2022-11-15 DIAGNOSIS — Z9181 History of falling: Secondary | ICD-10-CM | POA: Diagnosis not present

## 2022-11-15 DIAGNOSIS — Z8701 Personal history of pneumonia (recurrent): Secondary | ICD-10-CM | POA: Diagnosis not present

## 2022-11-15 DIAGNOSIS — L89312 Pressure ulcer of right buttock, stage 2: Secondary | ICD-10-CM | POA: Diagnosis not present

## 2022-11-15 DIAGNOSIS — Z79899 Other long term (current) drug therapy: Secondary | ICD-10-CM | POA: Diagnosis not present

## 2022-11-15 DIAGNOSIS — Z794 Long term (current) use of insulin: Secondary | ICD-10-CM | POA: Diagnosis not present

## 2022-11-15 DIAGNOSIS — I2510 Atherosclerotic heart disease of native coronary artery without angina pectoris: Secondary | ICD-10-CM | POA: Diagnosis not present

## 2022-11-22 DIAGNOSIS — Z7984 Long term (current) use of oral hypoglycemic drugs: Secondary | ICD-10-CM | POA: Diagnosis not present

## 2022-11-22 DIAGNOSIS — Z7902 Long term (current) use of antithrombotics/antiplatelets: Secondary | ICD-10-CM | POA: Diagnosis not present

## 2022-11-22 DIAGNOSIS — E785 Hyperlipidemia, unspecified: Secondary | ICD-10-CM | POA: Diagnosis not present

## 2022-11-22 DIAGNOSIS — D500 Iron deficiency anemia secondary to blood loss (chronic): Secondary | ICD-10-CM | POA: Diagnosis not present

## 2022-11-22 DIAGNOSIS — Z955 Presence of coronary angioplasty implant and graft: Secondary | ICD-10-CM | POA: Diagnosis not present

## 2022-11-22 DIAGNOSIS — S72142D Displaced intertrochanteric fracture of left femur, subsequent encounter for closed fracture with routine healing: Secondary | ICD-10-CM | POA: Diagnosis not present

## 2022-11-22 DIAGNOSIS — I2510 Atherosclerotic heart disease of native coronary artery without angina pectoris: Secondary | ICD-10-CM | POA: Diagnosis not present

## 2022-11-22 DIAGNOSIS — Z8701 Personal history of pneumonia (recurrent): Secondary | ICD-10-CM | POA: Diagnosis not present

## 2022-11-22 DIAGNOSIS — I5021 Acute systolic (congestive) heart failure: Secondary | ICD-10-CM | POA: Diagnosis not present

## 2022-11-22 DIAGNOSIS — I110 Hypertensive heart disease with heart failure: Secondary | ICD-10-CM | POA: Diagnosis not present

## 2022-11-22 DIAGNOSIS — Z9181 History of falling: Secondary | ICD-10-CM | POA: Diagnosis not present

## 2022-11-22 DIAGNOSIS — E119 Type 2 diabetes mellitus without complications: Secondary | ICD-10-CM | POA: Diagnosis not present

## 2022-11-22 DIAGNOSIS — Z79899 Other long term (current) drug therapy: Secondary | ICD-10-CM | POA: Diagnosis not present

## 2022-11-22 DIAGNOSIS — Z7982 Long term (current) use of aspirin: Secondary | ICD-10-CM | POA: Diagnosis not present

## 2022-11-22 DIAGNOSIS — E039 Hypothyroidism, unspecified: Secondary | ICD-10-CM | POA: Diagnosis not present

## 2022-11-22 DIAGNOSIS — Z794 Long term (current) use of insulin: Secondary | ICD-10-CM | POA: Diagnosis not present

## 2022-11-22 DIAGNOSIS — I252 Old myocardial infarction: Secondary | ICD-10-CM | POA: Diagnosis not present

## 2022-12-01 ENCOUNTER — Other Ambulatory Visit: Payer: Self-pay | Admitting: Cardiovascular Disease

## 2022-12-05 DIAGNOSIS — E039 Hypothyroidism, unspecified: Secondary | ICD-10-CM | POA: Diagnosis not present

## 2022-12-05 DIAGNOSIS — Z7982 Long term (current) use of aspirin: Secondary | ICD-10-CM | POA: Diagnosis not present

## 2022-12-05 DIAGNOSIS — E785 Hyperlipidemia, unspecified: Secondary | ICD-10-CM | POA: Diagnosis not present

## 2022-12-05 DIAGNOSIS — I5021 Acute systolic (congestive) heart failure: Secondary | ICD-10-CM | POA: Diagnosis not present

## 2022-12-05 DIAGNOSIS — I252 Old myocardial infarction: Secondary | ICD-10-CM | POA: Diagnosis not present

## 2022-12-05 DIAGNOSIS — S72142D Displaced intertrochanteric fracture of left femur, subsequent encounter for closed fracture with routine healing: Secondary | ICD-10-CM | POA: Diagnosis not present

## 2022-12-05 DIAGNOSIS — Z955 Presence of coronary angioplasty implant and graft: Secondary | ICD-10-CM | POA: Diagnosis not present

## 2022-12-05 DIAGNOSIS — Z8701 Personal history of pneumonia (recurrent): Secondary | ICD-10-CM | POA: Diagnosis not present

## 2022-12-05 DIAGNOSIS — Z9181 History of falling: Secondary | ICD-10-CM | POA: Diagnosis not present

## 2022-12-05 DIAGNOSIS — Z7902 Long term (current) use of antithrombotics/antiplatelets: Secondary | ICD-10-CM | POA: Diagnosis not present

## 2022-12-05 DIAGNOSIS — Z79899 Other long term (current) drug therapy: Secondary | ICD-10-CM | POA: Diagnosis not present

## 2022-12-05 DIAGNOSIS — I110 Hypertensive heart disease with heart failure: Secondary | ICD-10-CM | POA: Diagnosis not present

## 2022-12-05 DIAGNOSIS — E119 Type 2 diabetes mellitus without complications: Secondary | ICD-10-CM | POA: Diagnosis not present

## 2022-12-05 DIAGNOSIS — D500 Iron deficiency anemia secondary to blood loss (chronic): Secondary | ICD-10-CM | POA: Diagnosis not present

## 2022-12-05 DIAGNOSIS — Z7984 Long term (current) use of oral hypoglycemic drugs: Secondary | ICD-10-CM | POA: Diagnosis not present

## 2022-12-05 DIAGNOSIS — Z794 Long term (current) use of insulin: Secondary | ICD-10-CM | POA: Diagnosis not present

## 2022-12-05 DIAGNOSIS — I2510 Atherosclerotic heart disease of native coronary artery without angina pectoris: Secondary | ICD-10-CM | POA: Diagnosis not present

## 2022-12-10 ENCOUNTER — Ambulatory Visit: Payer: PPO | Admitting: Family

## 2022-12-10 ENCOUNTER — Other Ambulatory Visit: Payer: PPO

## 2022-12-12 DIAGNOSIS — Z7902 Long term (current) use of antithrombotics/antiplatelets: Secondary | ICD-10-CM | POA: Diagnosis not present

## 2022-12-12 DIAGNOSIS — E119 Type 2 diabetes mellitus without complications: Secondary | ICD-10-CM | POA: Diagnosis not present

## 2022-12-12 DIAGNOSIS — Z794 Long term (current) use of insulin: Secondary | ICD-10-CM | POA: Diagnosis not present

## 2022-12-12 DIAGNOSIS — D500 Iron deficiency anemia secondary to blood loss (chronic): Secondary | ICD-10-CM | POA: Diagnosis not present

## 2022-12-12 DIAGNOSIS — I2510 Atherosclerotic heart disease of native coronary artery without angina pectoris: Secondary | ICD-10-CM | POA: Diagnosis not present

## 2022-12-12 DIAGNOSIS — Z7984 Long term (current) use of oral hypoglycemic drugs: Secondary | ICD-10-CM | POA: Diagnosis not present

## 2022-12-12 DIAGNOSIS — Z7982 Long term (current) use of aspirin: Secondary | ICD-10-CM | POA: Diagnosis not present

## 2022-12-12 DIAGNOSIS — Z955 Presence of coronary angioplasty implant and graft: Secondary | ICD-10-CM | POA: Diagnosis not present

## 2022-12-12 DIAGNOSIS — Z9181 History of falling: Secondary | ICD-10-CM | POA: Diagnosis not present

## 2022-12-12 DIAGNOSIS — Z79899 Other long term (current) drug therapy: Secondary | ICD-10-CM | POA: Diagnosis not present

## 2022-12-12 DIAGNOSIS — E039 Hypothyroidism, unspecified: Secondary | ICD-10-CM | POA: Diagnosis not present

## 2022-12-12 DIAGNOSIS — I5021 Acute systolic (congestive) heart failure: Secondary | ICD-10-CM | POA: Diagnosis not present

## 2022-12-12 DIAGNOSIS — Z8701 Personal history of pneumonia (recurrent): Secondary | ICD-10-CM | POA: Diagnosis not present

## 2022-12-12 DIAGNOSIS — I252 Old myocardial infarction: Secondary | ICD-10-CM | POA: Diagnosis not present

## 2022-12-12 DIAGNOSIS — S72142D Displaced intertrochanteric fracture of left femur, subsequent encounter for closed fracture with routine healing: Secondary | ICD-10-CM | POA: Diagnosis not present

## 2022-12-12 DIAGNOSIS — E785 Hyperlipidemia, unspecified: Secondary | ICD-10-CM | POA: Diagnosis not present

## 2022-12-12 DIAGNOSIS — I110 Hypertensive heart disease with heart failure: Secondary | ICD-10-CM | POA: Diagnosis not present

## 2022-12-13 DIAGNOSIS — E119 Type 2 diabetes mellitus without complications: Secondary | ICD-10-CM | POA: Diagnosis not present

## 2022-12-13 DIAGNOSIS — I110 Hypertensive heart disease with heart failure: Secondary | ICD-10-CM | POA: Diagnosis not present

## 2022-12-13 DIAGNOSIS — E785 Hyperlipidemia, unspecified: Secondary | ICD-10-CM | POA: Diagnosis not present

## 2022-12-13 DIAGNOSIS — Z9181 History of falling: Secondary | ICD-10-CM | POA: Diagnosis not present

## 2022-12-13 DIAGNOSIS — D500 Iron deficiency anemia secondary to blood loss (chronic): Secondary | ICD-10-CM | POA: Diagnosis not present

## 2022-12-13 DIAGNOSIS — Z794 Long term (current) use of insulin: Secondary | ICD-10-CM | POA: Diagnosis not present

## 2022-12-13 DIAGNOSIS — Z8701 Personal history of pneumonia (recurrent): Secondary | ICD-10-CM | POA: Diagnosis not present

## 2022-12-13 DIAGNOSIS — I2510 Atherosclerotic heart disease of native coronary artery without angina pectoris: Secondary | ICD-10-CM | POA: Diagnosis not present

## 2022-12-13 DIAGNOSIS — I252 Old myocardial infarction: Secondary | ICD-10-CM | POA: Diagnosis not present

## 2022-12-13 DIAGNOSIS — Z7984 Long term (current) use of oral hypoglycemic drugs: Secondary | ICD-10-CM | POA: Diagnosis not present

## 2022-12-13 DIAGNOSIS — I5021 Acute systolic (congestive) heart failure: Secondary | ICD-10-CM | POA: Diagnosis not present

## 2022-12-13 DIAGNOSIS — S72142D Displaced intertrochanteric fracture of left femur, subsequent encounter for closed fracture with routine healing: Secondary | ICD-10-CM | POA: Diagnosis not present

## 2022-12-13 DIAGNOSIS — E039 Hypothyroidism, unspecified: Secondary | ICD-10-CM | POA: Diagnosis not present

## 2022-12-13 DIAGNOSIS — Z955 Presence of coronary angioplasty implant and graft: Secondary | ICD-10-CM | POA: Diagnosis not present

## 2022-12-13 DIAGNOSIS — Z7982 Long term (current) use of aspirin: Secondary | ICD-10-CM | POA: Diagnosis not present

## 2022-12-13 DIAGNOSIS — Z7902 Long term (current) use of antithrombotics/antiplatelets: Secondary | ICD-10-CM | POA: Diagnosis not present

## 2022-12-13 DIAGNOSIS — Z79899 Other long term (current) drug therapy: Secondary | ICD-10-CM | POA: Diagnosis not present

## 2022-12-18 DIAGNOSIS — I252 Old myocardial infarction: Secondary | ICD-10-CM | POA: Diagnosis not present

## 2022-12-18 DIAGNOSIS — D500 Iron deficiency anemia secondary to blood loss (chronic): Secondary | ICD-10-CM | POA: Diagnosis not present

## 2022-12-18 DIAGNOSIS — I110 Hypertensive heart disease with heart failure: Secondary | ICD-10-CM | POA: Diagnosis not present

## 2022-12-18 DIAGNOSIS — I5021 Acute systolic (congestive) heart failure: Secondary | ICD-10-CM | POA: Diagnosis not present

## 2022-12-18 DIAGNOSIS — Z955 Presence of coronary angioplasty implant and graft: Secondary | ICD-10-CM | POA: Diagnosis not present

## 2022-12-18 DIAGNOSIS — Z7982 Long term (current) use of aspirin: Secondary | ICD-10-CM | POA: Diagnosis not present

## 2022-12-18 DIAGNOSIS — Z79899 Other long term (current) drug therapy: Secondary | ICD-10-CM | POA: Diagnosis not present

## 2022-12-18 DIAGNOSIS — Z7902 Long term (current) use of antithrombotics/antiplatelets: Secondary | ICD-10-CM | POA: Diagnosis not present

## 2022-12-18 DIAGNOSIS — S72142D Displaced intertrochanteric fracture of left femur, subsequent encounter for closed fracture with routine healing: Secondary | ICD-10-CM | POA: Diagnosis not present

## 2022-12-18 DIAGNOSIS — I2510 Atherosclerotic heart disease of native coronary artery without angina pectoris: Secondary | ICD-10-CM | POA: Diagnosis not present

## 2022-12-18 DIAGNOSIS — E119 Type 2 diabetes mellitus without complications: Secondary | ICD-10-CM | POA: Diagnosis not present

## 2022-12-18 DIAGNOSIS — Z794 Long term (current) use of insulin: Secondary | ICD-10-CM | POA: Diagnosis not present

## 2022-12-18 DIAGNOSIS — E785 Hyperlipidemia, unspecified: Secondary | ICD-10-CM | POA: Diagnosis not present

## 2022-12-18 DIAGNOSIS — Z7984 Long term (current) use of oral hypoglycemic drugs: Secondary | ICD-10-CM | POA: Diagnosis not present

## 2022-12-18 DIAGNOSIS — Z9181 History of falling: Secondary | ICD-10-CM | POA: Diagnosis not present

## 2022-12-18 DIAGNOSIS — E039 Hypothyroidism, unspecified: Secondary | ICD-10-CM | POA: Diagnosis not present

## 2022-12-18 DIAGNOSIS — Z8701 Personal history of pneumonia (recurrent): Secondary | ICD-10-CM | POA: Diagnosis not present

## 2023-01-02 DIAGNOSIS — I2510 Atherosclerotic heart disease of native coronary artery without angina pectoris: Secondary | ICD-10-CM | POA: Diagnosis not present

## 2023-01-02 DIAGNOSIS — Z7984 Long term (current) use of oral hypoglycemic drugs: Secondary | ICD-10-CM | POA: Diagnosis not present

## 2023-01-02 DIAGNOSIS — Z79899 Other long term (current) drug therapy: Secondary | ICD-10-CM | POA: Diagnosis not present

## 2023-01-02 DIAGNOSIS — E785 Hyperlipidemia, unspecified: Secondary | ICD-10-CM | POA: Diagnosis not present

## 2023-01-02 DIAGNOSIS — D500 Iron deficiency anemia secondary to blood loss (chronic): Secondary | ICD-10-CM | POA: Diagnosis not present

## 2023-01-02 DIAGNOSIS — I5021 Acute systolic (congestive) heart failure: Secondary | ICD-10-CM | POA: Diagnosis not present

## 2023-01-02 DIAGNOSIS — Z794 Long term (current) use of insulin: Secondary | ICD-10-CM | POA: Diagnosis not present

## 2023-01-02 DIAGNOSIS — E119 Type 2 diabetes mellitus without complications: Secondary | ICD-10-CM | POA: Diagnosis not present

## 2023-01-02 DIAGNOSIS — E039 Hypothyroidism, unspecified: Secondary | ICD-10-CM | POA: Diagnosis not present

## 2023-01-02 DIAGNOSIS — S72142D Displaced intertrochanteric fracture of left femur, subsequent encounter for closed fracture with routine healing: Secondary | ICD-10-CM | POA: Diagnosis not present

## 2023-01-02 DIAGNOSIS — Z8701 Personal history of pneumonia (recurrent): Secondary | ICD-10-CM | POA: Diagnosis not present

## 2023-01-02 DIAGNOSIS — Z7982 Long term (current) use of aspirin: Secondary | ICD-10-CM | POA: Diagnosis not present

## 2023-01-02 DIAGNOSIS — Z9181 History of falling: Secondary | ICD-10-CM | POA: Diagnosis not present

## 2023-01-02 DIAGNOSIS — I252 Old myocardial infarction: Secondary | ICD-10-CM | POA: Diagnosis not present

## 2023-01-02 DIAGNOSIS — Z7902 Long term (current) use of antithrombotics/antiplatelets: Secondary | ICD-10-CM | POA: Diagnosis not present

## 2023-01-02 DIAGNOSIS — I110 Hypertensive heart disease with heart failure: Secondary | ICD-10-CM | POA: Diagnosis not present

## 2023-01-02 DIAGNOSIS — Z955 Presence of coronary angioplasty implant and graft: Secondary | ICD-10-CM | POA: Diagnosis not present

## 2023-01-07 DIAGNOSIS — Z9181 History of falling: Secondary | ICD-10-CM | POA: Diagnosis not present

## 2023-01-07 DIAGNOSIS — E785 Hyperlipidemia, unspecified: Secondary | ICD-10-CM | POA: Diagnosis not present

## 2023-01-07 DIAGNOSIS — Z955 Presence of coronary angioplasty implant and graft: Secondary | ICD-10-CM | POA: Diagnosis not present

## 2023-01-07 DIAGNOSIS — Z7982 Long term (current) use of aspirin: Secondary | ICD-10-CM | POA: Diagnosis not present

## 2023-01-07 DIAGNOSIS — D500 Iron deficiency anemia secondary to blood loss (chronic): Secondary | ICD-10-CM | POA: Diagnosis not present

## 2023-01-07 DIAGNOSIS — Z7902 Long term (current) use of antithrombotics/antiplatelets: Secondary | ICD-10-CM | POA: Diagnosis not present

## 2023-01-07 DIAGNOSIS — I2510 Atherosclerotic heart disease of native coronary artery without angina pectoris: Secondary | ICD-10-CM | POA: Diagnosis not present

## 2023-01-07 DIAGNOSIS — Z79899 Other long term (current) drug therapy: Secondary | ICD-10-CM | POA: Diagnosis not present

## 2023-01-07 DIAGNOSIS — I110 Hypertensive heart disease with heart failure: Secondary | ICD-10-CM | POA: Diagnosis not present

## 2023-01-07 DIAGNOSIS — Z7984 Long term (current) use of oral hypoglycemic drugs: Secondary | ICD-10-CM | POA: Diagnosis not present

## 2023-01-07 DIAGNOSIS — E039 Hypothyroidism, unspecified: Secondary | ICD-10-CM | POA: Diagnosis not present

## 2023-01-07 DIAGNOSIS — S72142D Displaced intertrochanteric fracture of left femur, subsequent encounter for closed fracture with routine healing: Secondary | ICD-10-CM | POA: Diagnosis not present

## 2023-01-07 DIAGNOSIS — I5021 Acute systolic (congestive) heart failure: Secondary | ICD-10-CM | POA: Diagnosis not present

## 2023-01-07 DIAGNOSIS — Z794 Long term (current) use of insulin: Secondary | ICD-10-CM | POA: Diagnosis not present

## 2023-01-07 DIAGNOSIS — E119 Type 2 diabetes mellitus without complications: Secondary | ICD-10-CM | POA: Diagnosis not present

## 2023-01-07 DIAGNOSIS — I252 Old myocardial infarction: Secondary | ICD-10-CM | POA: Diagnosis not present

## 2023-01-07 DIAGNOSIS — Z8701 Personal history of pneumonia (recurrent): Secondary | ICD-10-CM | POA: Diagnosis not present

## 2023-01-14 DIAGNOSIS — Z9181 History of falling: Secondary | ICD-10-CM | POA: Diagnosis not present

## 2023-01-14 DIAGNOSIS — I5021 Acute systolic (congestive) heart failure: Secondary | ICD-10-CM | POA: Diagnosis not present

## 2023-01-14 DIAGNOSIS — D500 Iron deficiency anemia secondary to blood loss (chronic): Secondary | ICD-10-CM | POA: Diagnosis not present

## 2023-01-14 DIAGNOSIS — Z7982 Long term (current) use of aspirin: Secondary | ICD-10-CM | POA: Diagnosis not present

## 2023-01-14 DIAGNOSIS — Z955 Presence of coronary angioplasty implant and graft: Secondary | ICD-10-CM | POA: Diagnosis not present

## 2023-01-14 DIAGNOSIS — E785 Hyperlipidemia, unspecified: Secondary | ICD-10-CM | POA: Diagnosis not present

## 2023-01-14 DIAGNOSIS — Z8701 Personal history of pneumonia (recurrent): Secondary | ICD-10-CM | POA: Diagnosis not present

## 2023-01-14 DIAGNOSIS — I252 Old myocardial infarction: Secondary | ICD-10-CM | POA: Diagnosis not present

## 2023-01-14 DIAGNOSIS — Z79899 Other long term (current) drug therapy: Secondary | ICD-10-CM | POA: Diagnosis not present

## 2023-01-14 DIAGNOSIS — E119 Type 2 diabetes mellitus without complications: Secondary | ICD-10-CM | POA: Diagnosis not present

## 2023-01-14 DIAGNOSIS — Z794 Long term (current) use of insulin: Secondary | ICD-10-CM | POA: Diagnosis not present

## 2023-01-14 DIAGNOSIS — I110 Hypertensive heart disease with heart failure: Secondary | ICD-10-CM | POA: Diagnosis not present

## 2023-01-14 DIAGNOSIS — E039 Hypothyroidism, unspecified: Secondary | ICD-10-CM | POA: Diagnosis not present

## 2023-01-14 DIAGNOSIS — S72142D Displaced intertrochanteric fracture of left femur, subsequent encounter for closed fracture with routine healing: Secondary | ICD-10-CM | POA: Diagnosis not present

## 2023-01-14 DIAGNOSIS — Z7984 Long term (current) use of oral hypoglycemic drugs: Secondary | ICD-10-CM | POA: Diagnosis not present

## 2023-01-14 DIAGNOSIS — Z7902 Long term (current) use of antithrombotics/antiplatelets: Secondary | ICD-10-CM | POA: Diagnosis not present

## 2023-01-14 DIAGNOSIS — I2510 Atherosclerotic heart disease of native coronary artery without angina pectoris: Secondary | ICD-10-CM | POA: Diagnosis not present

## 2023-01-23 DIAGNOSIS — E039 Hypothyroidism, unspecified: Secondary | ICD-10-CM | POA: Diagnosis not present

## 2023-01-23 DIAGNOSIS — Z9181 History of falling: Secondary | ICD-10-CM | POA: Diagnosis not present

## 2023-01-23 DIAGNOSIS — E119 Type 2 diabetes mellitus without complications: Secondary | ICD-10-CM | POA: Diagnosis not present

## 2023-01-23 DIAGNOSIS — E785 Hyperlipidemia, unspecified: Secondary | ICD-10-CM | POA: Diagnosis not present

## 2023-01-23 DIAGNOSIS — Z8701 Personal history of pneumonia (recurrent): Secondary | ICD-10-CM | POA: Diagnosis not present

## 2023-01-23 DIAGNOSIS — Z7902 Long term (current) use of antithrombotics/antiplatelets: Secondary | ICD-10-CM | POA: Diagnosis not present

## 2023-01-23 DIAGNOSIS — S72142D Displaced intertrochanteric fracture of left femur, subsequent encounter for closed fracture with routine healing: Secondary | ICD-10-CM | POA: Diagnosis not present

## 2023-01-23 DIAGNOSIS — I110 Hypertensive heart disease with heart failure: Secondary | ICD-10-CM | POA: Diagnosis not present

## 2023-01-23 DIAGNOSIS — I5021 Acute systolic (congestive) heart failure: Secondary | ICD-10-CM | POA: Diagnosis not present

## 2023-01-23 DIAGNOSIS — D500 Iron deficiency anemia secondary to blood loss (chronic): Secondary | ICD-10-CM | POA: Diagnosis not present

## 2023-01-23 DIAGNOSIS — Z794 Long term (current) use of insulin: Secondary | ICD-10-CM | POA: Diagnosis not present

## 2023-01-23 DIAGNOSIS — Z955 Presence of coronary angioplasty implant and graft: Secondary | ICD-10-CM | POA: Diagnosis not present

## 2023-01-23 DIAGNOSIS — Z7984 Long term (current) use of oral hypoglycemic drugs: Secondary | ICD-10-CM | POA: Diagnosis not present

## 2023-01-23 DIAGNOSIS — Z7982 Long term (current) use of aspirin: Secondary | ICD-10-CM | POA: Diagnosis not present

## 2023-01-23 DIAGNOSIS — I252 Old myocardial infarction: Secondary | ICD-10-CM | POA: Diagnosis not present

## 2023-01-23 DIAGNOSIS — I2510 Atherosclerotic heart disease of native coronary artery without angina pectoris: Secondary | ICD-10-CM | POA: Diagnosis not present

## 2023-02-06 DIAGNOSIS — Z7982 Long term (current) use of aspirin: Secondary | ICD-10-CM | POA: Diagnosis not present

## 2023-02-06 DIAGNOSIS — Z794 Long term (current) use of insulin: Secondary | ICD-10-CM | POA: Diagnosis not present

## 2023-02-06 DIAGNOSIS — S72142D Displaced intertrochanteric fracture of left femur, subsequent encounter for closed fracture with routine healing: Secondary | ICD-10-CM | POA: Diagnosis not present

## 2023-02-06 DIAGNOSIS — I2510 Atherosclerotic heart disease of native coronary artery without angina pectoris: Secondary | ICD-10-CM | POA: Diagnosis not present

## 2023-02-06 DIAGNOSIS — Z7984 Long term (current) use of oral hypoglycemic drugs: Secondary | ICD-10-CM | POA: Diagnosis not present

## 2023-02-06 DIAGNOSIS — E119 Type 2 diabetes mellitus without complications: Secondary | ICD-10-CM | POA: Diagnosis not present

## 2023-02-06 DIAGNOSIS — Z9181 History of falling: Secondary | ICD-10-CM | POA: Diagnosis not present

## 2023-02-06 DIAGNOSIS — D500 Iron deficiency anemia secondary to blood loss (chronic): Secondary | ICD-10-CM | POA: Diagnosis not present

## 2023-02-06 DIAGNOSIS — Z7902 Long term (current) use of antithrombotics/antiplatelets: Secondary | ICD-10-CM | POA: Diagnosis not present

## 2023-02-06 DIAGNOSIS — I110 Hypertensive heart disease with heart failure: Secondary | ICD-10-CM | POA: Diagnosis not present

## 2023-02-06 DIAGNOSIS — Z955 Presence of coronary angioplasty implant and graft: Secondary | ICD-10-CM | POA: Diagnosis not present

## 2023-02-06 DIAGNOSIS — I5021 Acute systolic (congestive) heart failure: Secondary | ICD-10-CM | POA: Diagnosis not present

## 2023-02-06 DIAGNOSIS — E039 Hypothyroidism, unspecified: Secondary | ICD-10-CM | POA: Diagnosis not present

## 2023-02-06 DIAGNOSIS — E785 Hyperlipidemia, unspecified: Secondary | ICD-10-CM | POA: Diagnosis not present

## 2023-02-06 DIAGNOSIS — Z8701 Personal history of pneumonia (recurrent): Secondary | ICD-10-CM | POA: Diagnosis not present

## 2023-02-06 DIAGNOSIS — I252 Old myocardial infarction: Secondary | ICD-10-CM | POA: Diagnosis not present

## 2023-02-07 DIAGNOSIS — S72142D Displaced intertrochanteric fracture of left femur, subsequent encounter for closed fracture with routine healing: Secondary | ICD-10-CM | POA: Diagnosis not present

## 2023-02-07 DIAGNOSIS — I110 Hypertensive heart disease with heart failure: Secondary | ICD-10-CM | POA: Diagnosis not present

## 2023-02-07 DIAGNOSIS — E119 Type 2 diabetes mellitus without complications: Secondary | ICD-10-CM | POA: Diagnosis not present

## 2023-02-07 DIAGNOSIS — I5021 Acute systolic (congestive) heart failure: Secondary | ICD-10-CM | POA: Diagnosis not present

## 2023-02-13 DIAGNOSIS — Z794 Long term (current) use of insulin: Secondary | ICD-10-CM | POA: Diagnosis not present

## 2023-02-13 DIAGNOSIS — E119 Type 2 diabetes mellitus without complications: Secondary | ICD-10-CM | POA: Diagnosis not present

## 2023-02-13 DIAGNOSIS — E785 Hyperlipidemia, unspecified: Secondary | ICD-10-CM | POA: Diagnosis not present

## 2023-02-13 DIAGNOSIS — Z955 Presence of coronary angioplasty implant and graft: Secondary | ICD-10-CM | POA: Diagnosis not present

## 2023-02-13 DIAGNOSIS — Z7902 Long term (current) use of antithrombotics/antiplatelets: Secondary | ICD-10-CM | POA: Diagnosis not present

## 2023-02-13 DIAGNOSIS — Z7984 Long term (current) use of oral hypoglycemic drugs: Secondary | ICD-10-CM | POA: Diagnosis not present

## 2023-02-13 DIAGNOSIS — I110 Hypertensive heart disease with heart failure: Secondary | ICD-10-CM | POA: Diagnosis not present

## 2023-02-13 DIAGNOSIS — Z9181 History of falling: Secondary | ICD-10-CM | POA: Diagnosis not present

## 2023-02-13 DIAGNOSIS — I252 Old myocardial infarction: Secondary | ICD-10-CM | POA: Diagnosis not present

## 2023-02-13 DIAGNOSIS — I5021 Acute systolic (congestive) heart failure: Secondary | ICD-10-CM | POA: Diagnosis not present

## 2023-02-13 DIAGNOSIS — E039 Hypothyroidism, unspecified: Secondary | ICD-10-CM | POA: Diagnosis not present

## 2023-02-13 DIAGNOSIS — Z8701 Personal history of pneumonia (recurrent): Secondary | ICD-10-CM | POA: Diagnosis not present

## 2023-02-13 DIAGNOSIS — Z7982 Long term (current) use of aspirin: Secondary | ICD-10-CM | POA: Diagnosis not present

## 2023-02-13 DIAGNOSIS — I2510 Atherosclerotic heart disease of native coronary artery without angina pectoris: Secondary | ICD-10-CM | POA: Diagnosis not present

## 2023-02-13 DIAGNOSIS — D500 Iron deficiency anemia secondary to blood loss (chronic): Secondary | ICD-10-CM | POA: Diagnosis not present

## 2023-02-13 DIAGNOSIS — S72142D Displaced intertrochanteric fracture of left femur, subsequent encounter for closed fracture with routine healing: Secondary | ICD-10-CM | POA: Diagnosis not present

## 2023-02-18 DIAGNOSIS — Z794 Long term (current) use of insulin: Secondary | ICD-10-CM | POA: Diagnosis not present

## 2023-02-18 DIAGNOSIS — I5021 Acute systolic (congestive) heart failure: Secondary | ICD-10-CM | POA: Diagnosis not present

## 2023-02-18 DIAGNOSIS — Z7984 Long term (current) use of oral hypoglycemic drugs: Secondary | ICD-10-CM | POA: Diagnosis not present

## 2023-02-18 DIAGNOSIS — E119 Type 2 diabetes mellitus without complications: Secondary | ICD-10-CM | POA: Diagnosis not present

## 2023-02-18 DIAGNOSIS — E785 Hyperlipidemia, unspecified: Secondary | ICD-10-CM | POA: Diagnosis not present

## 2023-02-18 DIAGNOSIS — S72142D Displaced intertrochanteric fracture of left femur, subsequent encounter for closed fracture with routine healing: Secondary | ICD-10-CM | POA: Diagnosis not present

## 2023-02-18 DIAGNOSIS — Z7902 Long term (current) use of antithrombotics/antiplatelets: Secondary | ICD-10-CM | POA: Diagnosis not present

## 2023-02-18 DIAGNOSIS — Z8701 Personal history of pneumonia (recurrent): Secondary | ICD-10-CM | POA: Diagnosis not present

## 2023-02-18 DIAGNOSIS — I252 Old myocardial infarction: Secondary | ICD-10-CM | POA: Diagnosis not present

## 2023-02-18 DIAGNOSIS — Z955 Presence of coronary angioplasty implant and graft: Secondary | ICD-10-CM | POA: Diagnosis not present

## 2023-02-18 DIAGNOSIS — E039 Hypothyroidism, unspecified: Secondary | ICD-10-CM | POA: Diagnosis not present

## 2023-02-18 DIAGNOSIS — I110 Hypertensive heart disease with heart failure: Secondary | ICD-10-CM | POA: Diagnosis not present

## 2023-02-18 DIAGNOSIS — I2510 Atherosclerotic heart disease of native coronary artery without angina pectoris: Secondary | ICD-10-CM | POA: Diagnosis not present

## 2023-02-18 DIAGNOSIS — Z7982 Long term (current) use of aspirin: Secondary | ICD-10-CM | POA: Diagnosis not present

## 2023-02-18 DIAGNOSIS — Z9181 History of falling: Secondary | ICD-10-CM | POA: Diagnosis not present

## 2023-02-18 DIAGNOSIS — D500 Iron deficiency anemia secondary to blood loss (chronic): Secondary | ICD-10-CM | POA: Diagnosis not present

## 2023-02-26 DIAGNOSIS — I110 Hypertensive heart disease with heart failure: Secondary | ICD-10-CM | POA: Diagnosis not present

## 2023-02-26 DIAGNOSIS — Z7984 Long term (current) use of oral hypoglycemic drugs: Secondary | ICD-10-CM | POA: Diagnosis not present

## 2023-02-26 DIAGNOSIS — Z8701 Personal history of pneumonia (recurrent): Secondary | ICD-10-CM | POA: Diagnosis not present

## 2023-02-26 DIAGNOSIS — D500 Iron deficiency anemia secondary to blood loss (chronic): Secondary | ICD-10-CM | POA: Diagnosis not present

## 2023-02-26 DIAGNOSIS — E039 Hypothyroidism, unspecified: Secondary | ICD-10-CM | POA: Diagnosis not present

## 2023-02-26 DIAGNOSIS — I2510 Atherosclerotic heart disease of native coronary artery without angina pectoris: Secondary | ICD-10-CM | POA: Diagnosis not present

## 2023-02-26 DIAGNOSIS — E785 Hyperlipidemia, unspecified: Secondary | ICD-10-CM | POA: Diagnosis not present

## 2023-02-26 DIAGNOSIS — I5021 Acute systolic (congestive) heart failure: Secondary | ICD-10-CM | POA: Diagnosis not present

## 2023-02-26 DIAGNOSIS — Z7982 Long term (current) use of aspirin: Secondary | ICD-10-CM | POA: Diagnosis not present

## 2023-02-26 DIAGNOSIS — S72142D Displaced intertrochanteric fracture of left femur, subsequent encounter for closed fracture with routine healing: Secondary | ICD-10-CM | POA: Diagnosis not present

## 2023-02-26 DIAGNOSIS — Z9181 History of falling: Secondary | ICD-10-CM | POA: Diagnosis not present

## 2023-02-26 DIAGNOSIS — Z955 Presence of coronary angioplasty implant and graft: Secondary | ICD-10-CM | POA: Diagnosis not present

## 2023-02-26 DIAGNOSIS — E119 Type 2 diabetes mellitus without complications: Secondary | ICD-10-CM | POA: Diagnosis not present

## 2023-02-26 DIAGNOSIS — Z794 Long term (current) use of insulin: Secondary | ICD-10-CM | POA: Diagnosis not present

## 2023-02-26 DIAGNOSIS — I252 Old myocardial infarction: Secondary | ICD-10-CM | POA: Diagnosis not present

## 2023-02-26 DIAGNOSIS — Z7902 Long term (current) use of antithrombotics/antiplatelets: Secondary | ICD-10-CM | POA: Diagnosis not present

## 2023-03-08 DIAGNOSIS — E119 Type 2 diabetes mellitus without complications: Secondary | ICD-10-CM | POA: Diagnosis not present

## 2023-03-08 DIAGNOSIS — E039 Hypothyroidism, unspecified: Secondary | ICD-10-CM | POA: Diagnosis not present

## 2023-03-08 DIAGNOSIS — I5021 Acute systolic (congestive) heart failure: Secondary | ICD-10-CM | POA: Diagnosis not present

## 2023-03-08 DIAGNOSIS — Z7984 Long term (current) use of oral hypoglycemic drugs: Secondary | ICD-10-CM | POA: Diagnosis not present

## 2023-03-08 DIAGNOSIS — Z7902 Long term (current) use of antithrombotics/antiplatelets: Secondary | ICD-10-CM | POA: Diagnosis not present

## 2023-03-08 DIAGNOSIS — Z9181 History of falling: Secondary | ICD-10-CM | POA: Diagnosis not present

## 2023-03-08 DIAGNOSIS — Z7982 Long term (current) use of aspirin: Secondary | ICD-10-CM | POA: Diagnosis not present

## 2023-03-08 DIAGNOSIS — D500 Iron deficiency anemia secondary to blood loss (chronic): Secondary | ICD-10-CM | POA: Diagnosis not present

## 2023-03-08 DIAGNOSIS — E785 Hyperlipidemia, unspecified: Secondary | ICD-10-CM | POA: Diagnosis not present

## 2023-03-08 DIAGNOSIS — I110 Hypertensive heart disease with heart failure: Secondary | ICD-10-CM | POA: Diagnosis not present

## 2023-03-08 DIAGNOSIS — Z8701 Personal history of pneumonia (recurrent): Secondary | ICD-10-CM | POA: Diagnosis not present

## 2023-03-08 DIAGNOSIS — S72142D Displaced intertrochanteric fracture of left femur, subsequent encounter for closed fracture with routine healing: Secondary | ICD-10-CM | POA: Diagnosis not present

## 2023-03-08 DIAGNOSIS — Z794 Long term (current) use of insulin: Secondary | ICD-10-CM | POA: Diagnosis not present

## 2023-03-08 DIAGNOSIS — Z955 Presence of coronary angioplasty implant and graft: Secondary | ICD-10-CM | POA: Diagnosis not present

## 2023-03-08 DIAGNOSIS — I2510 Atherosclerotic heart disease of native coronary artery without angina pectoris: Secondary | ICD-10-CM | POA: Diagnosis not present

## 2023-03-08 DIAGNOSIS — I252 Old myocardial infarction: Secondary | ICD-10-CM | POA: Diagnosis not present

## 2023-03-13 DIAGNOSIS — E785 Hyperlipidemia, unspecified: Secondary | ICD-10-CM | POA: Diagnosis not present

## 2023-03-13 DIAGNOSIS — D500 Iron deficiency anemia secondary to blood loss (chronic): Secondary | ICD-10-CM | POA: Diagnosis not present

## 2023-03-13 DIAGNOSIS — Z794 Long term (current) use of insulin: Secondary | ICD-10-CM | POA: Diagnosis not present

## 2023-03-13 DIAGNOSIS — Z9181 History of falling: Secondary | ICD-10-CM | POA: Diagnosis not present

## 2023-03-13 DIAGNOSIS — E119 Type 2 diabetes mellitus without complications: Secondary | ICD-10-CM | POA: Diagnosis not present

## 2023-03-13 DIAGNOSIS — I110 Hypertensive heart disease with heart failure: Secondary | ICD-10-CM | POA: Diagnosis not present

## 2023-03-13 DIAGNOSIS — S72142D Displaced intertrochanteric fracture of left femur, subsequent encounter for closed fracture with routine healing: Secondary | ICD-10-CM | POA: Diagnosis not present

## 2023-03-13 DIAGNOSIS — Z7982 Long term (current) use of aspirin: Secondary | ICD-10-CM | POA: Diagnosis not present

## 2023-03-13 DIAGNOSIS — I2510 Atherosclerotic heart disease of native coronary artery without angina pectoris: Secondary | ICD-10-CM | POA: Diagnosis not present

## 2023-03-13 DIAGNOSIS — E039 Hypothyroidism, unspecified: Secondary | ICD-10-CM | POA: Diagnosis not present

## 2023-03-13 DIAGNOSIS — Z7984 Long term (current) use of oral hypoglycemic drugs: Secondary | ICD-10-CM | POA: Diagnosis not present

## 2023-03-13 DIAGNOSIS — Z7902 Long term (current) use of antithrombotics/antiplatelets: Secondary | ICD-10-CM | POA: Diagnosis not present

## 2023-03-13 DIAGNOSIS — I252 Old myocardial infarction: Secondary | ICD-10-CM | POA: Diagnosis not present

## 2023-03-13 DIAGNOSIS — Z8701 Personal history of pneumonia (recurrent): Secondary | ICD-10-CM | POA: Diagnosis not present

## 2023-03-13 DIAGNOSIS — I5021 Acute systolic (congestive) heart failure: Secondary | ICD-10-CM | POA: Diagnosis not present

## 2023-03-13 DIAGNOSIS — Z955 Presence of coronary angioplasty implant and graft: Secondary | ICD-10-CM | POA: Diagnosis not present

## 2023-04-19 ENCOUNTER — Other Ambulatory Visit: Payer: Self-pay | Admitting: Cardiovascular Disease

## 2023-04-25 DIAGNOSIS — E1165 Type 2 diabetes mellitus with hyperglycemia: Secondary | ICD-10-CM | POA: Diagnosis not present

## 2023-04-25 DIAGNOSIS — M810 Age-related osteoporosis without current pathological fracture: Secondary | ICD-10-CM | POA: Diagnosis not present

## 2023-04-25 DIAGNOSIS — I10 Essential (primary) hypertension: Secondary | ICD-10-CM | POA: Diagnosis not present

## 2023-04-29 ENCOUNTER — Encounter (INDEPENDENT_AMBULATORY_CARE_PROVIDER_SITE_OTHER): Payer: PPO | Admitting: Ophthalmology

## 2023-04-30 DIAGNOSIS — I509 Heart failure, unspecified: Secondary | ICD-10-CM | POA: Diagnosis not present

## 2023-04-30 DIAGNOSIS — I739 Peripheral vascular disease, unspecified: Secondary | ICD-10-CM | POA: Diagnosis not present

## 2023-04-30 DIAGNOSIS — R8271 Bacteriuria: Secondary | ICD-10-CM | POA: Diagnosis not present

## 2023-04-30 DIAGNOSIS — I10 Essential (primary) hypertension: Secondary | ICD-10-CM | POA: Diagnosis not present

## 2023-04-30 DIAGNOSIS — G4762 Sleep related leg cramps: Secondary | ICD-10-CM | POA: Diagnosis not present

## 2023-04-30 DIAGNOSIS — I252 Old myocardial infarction: Secondary | ICD-10-CM | POA: Diagnosis not present

## 2023-04-30 DIAGNOSIS — I25118 Atherosclerotic heart disease of native coronary artery with other forms of angina pectoris: Secondary | ICD-10-CM | POA: Diagnosis not present

## 2023-04-30 DIAGNOSIS — Z0000 Encounter for general adult medical examination without abnormal findings: Secondary | ICD-10-CM | POA: Diagnosis not present

## 2023-04-30 DIAGNOSIS — E039 Hypothyroidism, unspecified: Secondary | ICD-10-CM | POA: Diagnosis not present

## 2023-04-30 DIAGNOSIS — R2689 Other abnormalities of gait and mobility: Secondary | ICD-10-CM | POA: Diagnosis not present

## 2023-04-30 DIAGNOSIS — N1831 Chronic kidney disease, stage 3a: Secondary | ICD-10-CM | POA: Diagnosis not present

## 2023-04-30 DIAGNOSIS — E1121 Type 2 diabetes mellitus with diabetic nephropathy: Secondary | ICD-10-CM | POA: Diagnosis not present

## 2023-05-13 DIAGNOSIS — I25118 Atherosclerotic heart disease of native coronary artery with other forms of angina pectoris: Secondary | ICD-10-CM | POA: Diagnosis not present

## 2023-05-13 DIAGNOSIS — I10 Essential (primary) hypertension: Secondary | ICD-10-CM | POA: Diagnosis not present

## 2023-05-13 DIAGNOSIS — E1121 Type 2 diabetes mellitus with diabetic nephropathy: Secondary | ICD-10-CM | POA: Diagnosis not present

## 2023-05-13 DIAGNOSIS — E039 Hypothyroidism, unspecified: Secondary | ICD-10-CM | POA: Diagnosis not present

## 2023-05-13 DIAGNOSIS — E782 Mixed hyperlipidemia: Secondary | ICD-10-CM | POA: Diagnosis not present

## 2023-06-13 DIAGNOSIS — E039 Hypothyroidism, unspecified: Secondary | ICD-10-CM | POA: Diagnosis not present

## 2023-06-13 DIAGNOSIS — I10 Essential (primary) hypertension: Secondary | ICD-10-CM | POA: Diagnosis not present

## 2023-06-13 DIAGNOSIS — E1165 Type 2 diabetes mellitus with hyperglycemia: Secondary | ICD-10-CM | POA: Diagnosis not present

## 2023-06-13 DIAGNOSIS — E1121 Type 2 diabetes mellitus with diabetic nephropathy: Secondary | ICD-10-CM | POA: Diagnosis not present

## 2023-06-19 NOTE — Progress Notes (Signed)
Patient ID: Danielle Murray, female   DOB: 1940/12/24, 82 y.o.   MRN: [id number]      82 y.o. f/u CAD, HTN, HLD, IDDM.  08/2013 stent to LAD and medical Rx for small left sided PDA. Low risk myovue in June 2018 She is a twin and her sister died in 70.  Sees [facility] for her DM.Marland Kitchen Some LE edema dependant  TTE 11/09/20 EF 55-60% grade 2 diastolic no valve disese  TTE 40/98/11 EF 60-65% grade 2 diastolic normal RV mild MR  Leg cramps better on crestor rather than lipitor   Seen by Dr Allyson Sabal in October 2021 stable although she indicated previously she wanted to f/u with someone else ABI 01/14/20 right 0.96 and left 0.83   06/19/22 Admitted with hip fracture and developed fever, pneumonia volume overload and SEMI In post op period TTE with EF 35-40% Cath with LAD stent patent jailed D1 with 70% ostial LCX Diuresed as right heart pressures elevated PA 61/21 mmHg PCWP 26 mmHg Returned 06/25/22 for shockwave assisted stent to ostial LCX left dominant  F/U TTE 09/25/22 EF 60-65% with normal RV and mild MR  Post d/c had palliative care f/u and SNF with rehab  Doing great Walking with walker Decubitus healed   Needs refills  ROS: Denies fever, malais, weight loss, blurry vision, decreased visual acuity, cough, sputum, SOB, hemoptysis, pleuritic pain, palpitaitons, heartburn, abdominal pain, melena, lower extremity edema, claudication, or rash.  All other systems reviewed and negative  General: BP 138/60   Pulse 62   Ht 5\' 4"  (1.626 m)   Wt 181 lb (82.1 kg)   SpO2 94%   BMI 31.07 kg/m  Affect appropriate Healthy:  appears stated age HEENT: normal Neck supple with no adenopathy JVP normal no bruits no thyromegaly Lungs clear with no wheezing and good diaphragmatic motion Heart:  S1/S2 no murmur, no rub, gallop or click PMI normal Abdomen: benighn, BS positve, no tenderness, no AAA no bruit.  No HSM or HJR Distal pulses intact with no bruits Trace bilateral edema Neuro non-focal Skin warm and  dry No muscular weakness   Current Outpatient Medications  Medication Sig Dispense Refill   acetaminophen (TYLENOL) 325 MG tablet Take 2 tablets (650 mg total) by mouth every 4 (four) hours as needed for headache or mild pain.     aspirin 81 MG chewable tablet Chew 1 tablet (81 mg total) by mouth daily.     carvedilol (COREG) 6.25 MG tablet TAKE 1 TABLET BY MOUTH TWICE DAILY WITH A MEAL 180 tablet 1   Cholecalciferol (VITAMIN D3 PO) Take 1 tablet by mouth daily.     clopidogrel (PLAVIX) 75 MG tablet Take 1 tablet by mouth once daily 90 tablet 3   Continuous Blood Gluc Sensor (FREESTYLE LIBRE 2 SENSOR) MISC APPLY AS DIRECTED EVERY 14 DAYS     empagliflozin (JARDIANCE) 10 MG TABS tablet Take 1 tablet (10 mg total) by mouth daily. 90 tablet 3   furosemide (LASIX) 40 MG tablet Take 1 tablet (40 mg total) by mouth daily. 90 tablet 3   isosorbide mononitrate (IMDUR) 30 MG 24 hr tablet Take 1 tablet by mouth once daily 90 tablet 3   levothyroxine (SYNTHROID, LEVOTHROID) 88 MCG tablet Take 88 mcg by mouth at bedtime.      losartan (COZAAR) 25 MG tablet Take 1/2 (one-half) tablet by mouth once daily 45 tablet 1   Menthol, Topical Analgesic, (BIOFREEZE ROLL-ON) 4 % GEL Apply topically as needed.  nitroGLYCERIN (NITROSTAT) 0.4 MG SL tablet Place 1 tablet (0.4 mg total) under the tongue every 5 (five) minutes as needed for chest pain (3 doses max). 25 tablet 3   NOVOLIN 70/30 KWIKPEN (70-30) 100 UNIT/ML KwikPen Inject 20 Units into the skin in the morning and at bedtime. Take 30 units in the morning and Take 50 units at bedtime (Patient taking differently: Inject 20 Units into the skin daily at 6 (six) AM. Take 30 units in the morning)     rosuvastatin (CRESTOR) 5 MG tablet Take 1 tablet (5 mg total) by mouth daily. (Patient taking differently: Take 10 mg by mouth daily.) 90 tablet 3   spironolactone (ALDACTONE) 25 MG tablet Take 0.5 tablets (12.5 mg total) by mouth daily. 45 tablet 3   Ferrous Sulfate  (IRON) 325 (65 Fe) MG TABS Take 65 mg by mouth every other day. (Patient not taking: Reported on 06/25/2023)     Probiotic Product (PROBIOTIC PO) Take 1 tablet by mouth daily. (Patient not taking: Reported on 06/25/2023)     No current facility-administered medications for this visit.    Allergies  Penicillins  Electrocardiogram:  09/16/19  SR rate 61 low voltage no changes 06/25/2023 SR rate 67 LAD low voltage no acute changes   Assessment and Plan CAD: cath 06/21/22 with patent stent LAD New stent to ostial LCX 06/25/22 with improvement in EF continue DAT and medical Rx DM: Discussed low carb diet.  Target hemoglobin A1c is 6.5 or less.  Continue current medications. F/U with [facility] to adjust insulin Sees Dr Luciana Axe for diabetic eye exams  HTN: Well controlled.  Continue current medications and low sodium Dash type diet.   Thyroid  Continue replacement sees [facility] for labs  Edema:  Continue diuretics low sodium diet and elevation with support hose  Cholesterol:  Continue statin labs with primary  PVD:  ABI reduced on left 0.83  March 2021 f/u Dr Thera Flake no claudication no need to repeat ABIs now Dyspnea:  TTE  09/25/22 EF 60-65% post intervention with mild MR and normal RV continue aldactone filling pressures high prior to intervention  Ortho:  left hip fracture  post repair July 2023 and sacral decubitus f/u wound center    F/u  Dr Allyson Sabal and me in a year   Charlton Haws

## 2023-06-25 ENCOUNTER — Ambulatory Visit: Payer: PPO | Attending: Cardiovascular Disease | Admitting: Cardiovascular Disease

## 2023-06-25 ENCOUNTER — Encounter: Payer: Self-pay | Admitting: Cardiovascular Disease

## 2023-06-25 VITALS — BP 138/60 | HR 62 | Ht 64.0 in | Wt 181.0 lb

## 2023-06-25 DIAGNOSIS — I10 Essential (primary) hypertension: Secondary | ICD-10-CM | POA: Diagnosis not present

## 2023-06-25 DIAGNOSIS — I2510 Atherosclerotic heart disease of native coronary artery without angina pectoris: Secondary | ICD-10-CM | POA: Diagnosis not present

## 2023-06-25 DIAGNOSIS — I739 Peripheral vascular disease, unspecified: Secondary | ICD-10-CM | POA: Diagnosis not present

## 2023-06-25 NOTE — Patient Instructions (Signed)

## 2023-07-14 ENCOUNTER — Other Ambulatory Visit: Payer: Self-pay | Admitting: Cardiovascular Disease

## 2023-07-27 ENCOUNTER — Other Ambulatory Visit: Payer: Self-pay | Admitting: Cardiovascular Disease

## 2023-09-13 DIAGNOSIS — E1165 Type 2 diabetes mellitus with hyperglycemia: Secondary | ICD-10-CM | POA: Diagnosis not present

## 2023-09-13 DIAGNOSIS — E039 Hypothyroidism, unspecified: Secondary | ICD-10-CM | POA: Diagnosis not present

## 2023-09-13 DIAGNOSIS — I10 Essential (primary) hypertension: Secondary | ICD-10-CM | POA: Diagnosis not present

## 2023-09-20 DIAGNOSIS — I10 Essential (primary) hypertension: Secondary | ICD-10-CM | POA: Diagnosis not present

## 2023-09-20 DIAGNOSIS — E1121 Type 2 diabetes mellitus with diabetic nephropathy: Secondary | ICD-10-CM | POA: Diagnosis not present

## 2023-09-20 DIAGNOSIS — I25118 Atherosclerotic heart disease of native coronary artery with other forms of angina pectoris: Secondary | ICD-10-CM | POA: Diagnosis not present

## 2023-09-20 DIAGNOSIS — E039 Hypothyroidism, unspecified: Secondary | ICD-10-CM | POA: Diagnosis not present

## 2023-10-09 ENCOUNTER — Other Ambulatory Visit: Payer: Self-pay | Admitting: Cardiovascular Disease

## 2023-10-11 ENCOUNTER — Other Ambulatory Visit: Payer: Self-pay | Admitting: Cardiovascular Disease

## 2023-10-22 ENCOUNTER — Other Ambulatory Visit: Payer: Self-pay | Admitting: Cardiovascular Disease

## 2023-10-26 ENCOUNTER — Other Ambulatory Visit: Payer: Self-pay | Admitting: Cardiovascular Disease

## 2023-10-28 MED ORDER — EMPAGLIFLOZIN 10 MG PO TABS: 10.0000 mg | ORAL_TABLET | Freq: Every day | ORAL | 2 refills | Status: DC

## 2023-11-04 DIAGNOSIS — I25118 Atherosclerotic heart disease of native coronary artery with other forms of angina pectoris: Secondary | ICD-10-CM | POA: Diagnosis not present

## 2023-11-04 DIAGNOSIS — I10 Essential (primary) hypertension: Secondary | ICD-10-CM | POA: Diagnosis not present

## 2023-11-04 DIAGNOSIS — E1165 Type 2 diabetes mellitus with hyperglycemia: Secondary | ICD-10-CM | POA: Diagnosis not present

## 2023-11-04 DIAGNOSIS — E1121 Type 2 diabetes mellitus with diabetic nephropathy: Secondary | ICD-10-CM | POA: Diagnosis not present

## 2023-11-04 DIAGNOSIS — E039 Hypothyroidism, unspecified: Secondary | ICD-10-CM | POA: Diagnosis not present

## 2023-11-13 ENCOUNTER — Other Ambulatory Visit: Payer: Self-pay | Admitting: Cardiovascular Disease

## 2024-02-26 DIAGNOSIS — E039 Hypothyroidism, unspecified: Secondary | ICD-10-CM | POA: Diagnosis not present

## 2024-02-26 DIAGNOSIS — I10 Essential (primary) hypertension: Secondary | ICD-10-CM | POA: Diagnosis not present

## 2024-02-26 DIAGNOSIS — E1165 Type 2 diabetes mellitus with hyperglycemia: Secondary | ICD-10-CM | POA: Diagnosis not present

## 2024-03-04 DIAGNOSIS — E1121 Type 2 diabetes mellitus with diabetic nephropathy: Secondary | ICD-10-CM | POA: Diagnosis not present

## 2024-03-04 DIAGNOSIS — I10 Essential (primary) hypertension: Secondary | ICD-10-CM | POA: Diagnosis not present

## 2024-03-04 DIAGNOSIS — I25118 Atherosclerotic heart disease of native coronary artery with other forms of angina pectoris: Secondary | ICD-10-CM | POA: Diagnosis not present

## 2024-03-04 DIAGNOSIS — E039 Hypothyroidism, unspecified: Secondary | ICD-10-CM | POA: Diagnosis not present

## 2024-03-04 DIAGNOSIS — E1165 Type 2 diabetes mellitus with hyperglycemia: Secondary | ICD-10-CM | POA: Diagnosis not present

## 2024-03-18 DIAGNOSIS — I10 Essential (primary) hypertension: Secondary | ICD-10-CM | POA: Diagnosis not present

## 2024-04-29 DIAGNOSIS — I10 Essential (primary) hypertension: Secondary | ICD-10-CM | POA: Diagnosis not present

## 2024-05-04 DIAGNOSIS — N3000 Acute cystitis without hematuria: Secondary | ICD-10-CM | POA: Diagnosis not present

## 2024-05-04 DIAGNOSIS — E1121 Type 2 diabetes mellitus with diabetic nephropathy: Secondary | ICD-10-CM | POA: Diagnosis not present

## 2024-05-04 DIAGNOSIS — Z0000 Encounter for general adult medical examination without abnormal findings: Secondary | ICD-10-CM | POA: Diagnosis not present

## 2024-05-04 DIAGNOSIS — I739 Peripheral vascular disease, unspecified: Secondary | ICD-10-CM | POA: Diagnosis not present

## 2024-05-04 DIAGNOSIS — I25118 Atherosclerotic heart disease of native coronary artery with other forms of angina pectoris: Secondary | ICD-10-CM | POA: Diagnosis not present

## 2024-05-04 DIAGNOSIS — I252 Old myocardial infarction: Secondary | ICD-10-CM | POA: Diagnosis not present

## 2024-05-04 DIAGNOSIS — I509 Heart failure, unspecified: Secondary | ICD-10-CM | POA: Diagnosis not present

## 2024-05-04 DIAGNOSIS — E782 Mixed hyperlipidemia: Secondary | ICD-10-CM | POA: Diagnosis not present

## 2024-05-04 DIAGNOSIS — N1831 Chronic kidney disease, stage 3a: Secondary | ICD-10-CM | POA: Diagnosis not present

## 2024-05-04 DIAGNOSIS — I10 Essential (primary) hypertension: Secondary | ICD-10-CM | POA: Diagnosis not present

## 2024-05-04 DIAGNOSIS — E039 Hypothyroidism, unspecified: Secondary | ICD-10-CM | POA: Diagnosis not present

## 2024-05-04 DIAGNOSIS — M810 Age-related osteoporosis without current pathological fracture: Secondary | ICD-10-CM | POA: Diagnosis not present

## 2024-06-26 ENCOUNTER — Other Ambulatory Visit: Payer: Self-pay | Admitting: Cardiovascular Disease

## 2024-07-03 ENCOUNTER — Other Ambulatory Visit: Payer: Self-pay | Admitting: Cardiovascular Disease

## 2024-07-06 ENCOUNTER — Other Ambulatory Visit (HOSPITAL_COMMUNITY): Payer: Self-pay

## 2024-07-11 ENCOUNTER — Other Ambulatory Visit: Payer: Self-pay | Admitting: Cardiovascular Disease

## 2024-07-14 ENCOUNTER — Other Ambulatory Visit: Payer: Self-pay

## 2024-07-24 ENCOUNTER — Other Ambulatory Visit: Payer: Self-pay | Admitting: Cardiovascular Disease

## 2024-07-28 NOTE — Progress Notes (Addendum)
 Patient ID: Danielle Murray, female   DOB: [birth year], 83 y.o.   MRN: [id number]      83 y.o. f/u CAD, HTN, HLD, IDDM.  08/2013 stent to LAD and medical Rx for small left sided PDA. Low risk myovue in June 2018 She is a twin and her sister died in 70.  Sees [facility] for her DM.SABRA Some LE edema dependant  TTE 11/09/20 EF 55-60% grade 2 diastolic no valve disese  TTE 88/85/76 EF 60-65% grade 2 diastolic normal RV mild MR  Leg cramps better on crestor  rather than lipitor   Seen by Dr Court in October 2021 stable although she indicated previously she wanted to f/u with someone else ABI 01/14/20 right 0.96 and left 0.83   06/19/22 Admitted with hip fracture and developed fever, pneumonia volume overload and SEMI In post op period TTE with EF 35-40% Cath with LAD stent patent jailed D1 with 70% ostial LCX Diuresed as right heart pressures elevated PA 61/21 mmHg PCWP 26 mmHg Returned 06/25/22 for shockwave assisted stent to ostial LCX left dominant  F/U TTE 09/25/22 EF 60-65% with normal RV and mild MR  Post d/c had palliative care f/u and SNF with rehab  Doing great Walking with walker Decubitus healed   Not taking her lasix  daily taking aldactone  Some lymphedema Eating potato chips Married 61 years now No angina   ROS: Denies fever, malais, weight loss, blurry vision, decreased visual acuity, cough, sputum, SOB, hemoptysis, pleuritic pain, palpitaitons, heartburn, abdominal pain, melena, lower extremity edema, claudication, or rash.  All other systems reviewed and negative  General: Ht 5' 4 (1.626 m)   Wt 188 lb 3.2 oz (85.4 kg)   SpO2 98%   BMI 32.30 kg/m  Repeat BP by me 158/78 mmHg Patient indicates white coat issues and BP ok at home  Affect appropriate Healthy:  appears stated age HEENT: normal Neck supple with no adenopathy JVP normal no bruits no thyromegaly Lungs clear with no wheezing and good diaphragmatic motion Heart:  S1/S2 no murmur, no rub, gallop or click PMI normal Abdomen:  benighn, BS positve, no tenderness, no AAA no bruit.  No HSM or HJR Distal pulses intact with no bruits Plus 2 pedal bilateral edema Neuro non-focal Skin warm and dry No muscular weakness   Current Outpatient Medications  Medication Sig Dispense Refill   acetaminophen  (TYLENOL ) 325 MG tablet Take 2 tablets (650 mg total) by mouth every 4 (four) hours as needed for headache or mild pain.     aspirin  81 MG chewable tablet Chew 1 tablet (81 mg total) by mouth daily.     carvedilol  (COREG ) 6.25 MG tablet TAKE 1 TABLET BY MOUTH TWICE DAILY WITH A MEAL 60 tablet 0   Cholecalciferol (VITAMIN D3 PO) Take 1 tablet by mouth daily.     clopidogrel  (PLAVIX ) 75 MG tablet Take 1 tablet by mouth once daily 90 tablet 0   Continuous Blood Gluc Sensor (FREESTYLE LIBRE 2 SENSOR) MISC APPLY AS DIRECTED EVERY 14 DAYS     empagliflozin  (JARDIANCE ) 10 MG TABS tablet Take 1 tablet (10 mg total) by mouth daily. 90 tablet 2   Ferrous Sulfate (IRON ) 325 (65 Fe) MG TABS Take 65 mg by mouth every other day. (Patient not taking: Reported on 06/25/2023)     furosemide  (LASIX ) 40 MG tablet Take 1 tablet (40 mg total) by mouth daily. 90 tablet 3   isosorbide  mononitrate (IMDUR ) 30 MG 24 hr tablet TAKE 1 TABLET BY MOUTH ONCE DAILY .  APPOINTMENT REQUIRED FOR FUTURE REFILLS 30 tablet 0   levothyroxine  (SYNTHROID , LEVOTHROID) 88 MCG tablet Take 88 mcg by mouth at bedtime.      losartan  (COZAAR ) 25 MG tablet Take 1/2 (one-half) tablet by mouth once daily 45 tablet 2   Menthol, Topical Analgesic, (BIOFREEZE ROLL-ON) 4 % GEL Apply topically as needed.     nitroGLYCERIN  (NITROSTAT ) 0.4 MG SL tablet Place 1 tablet (0.4 mg total) under the tongue every 5 (five) minutes as needed for chest pain (3 doses max). 25 tablet 3   NOVOLIN  70/30 KWIKPEN (70-30) 100 UNIT/ML KwikPen Inject 20 Units into the skin in the morning and at bedtime. Take 30 units in the morning and Take 50 units at bedtime (Patient taking differently: Inject 20 Units  into the skin daily at 6 (six) AM. Take 30 units in the morning)     Probiotic Product (PROBIOTIC PO) Take 1 tablet by mouth daily. (Patient not taking: Reported on 06/25/2023)     rosuvastatin  (CRESTOR ) 5 MG tablet Take 1 tablet (5 mg total) by mouth daily. (Patient taking differently: Take 10 mg by mouth daily.) 90 tablet 3   spironolactone  (ALDACTONE ) 25 MG tablet Take 1/2 (one-half) tablet by mouth once daily 45 tablet 0   No current facility-administered medications for this visit.    Allergies  Penicillins  Electrocardiogram:  09/16/19  SR rate 61 low voltage no changes 08/10/2024 SR rate 67 LAD low voltage no acute changes   Assessment and Plan CAD: cath 06/21/22 with patent stent LAD New stent to ostial LCX 06/25/22 with improvement in EF continue DAT and medical Rx DM: Discussed low carb diet.  Target hemoglobin A1c is 6.5 or less.  Continue current medications. F/U with [facility] to adjust insulin  Sees Dr Elner for diabetic eye exams  HTN: white coat component continue current meds monitor at home Decrease salt Thyroid   Continue replacement sees [facility]in for labs  Edema:  Continue diuretics low sodium diet and elevation with support hose Combination of lymphedema and dependant venous edema Cholesterol:  Continue statin labs with primary  PVD:  ABI reduced on left 0.83  March 2021 f/u Dr Uvaldo no claudication no need to repeat ABIs now Dyspnea:  TTE  09/25/22 EF 60-65% post intervention with mild MR and normal RV continue aldactone  filling pressures high prior to intervention  Ortho:  left hip fracture  post repair July 2023 and sacral decubitus f/u wound center    F/u  Dr Court and me in a year   Maude Emmer

## 2024-08-01 ENCOUNTER — Other Ambulatory Visit: Payer: Self-pay | Admitting: Cardiovascular Disease

## 2024-08-05 ENCOUNTER — Other Ambulatory Visit: Payer: Self-pay | Admitting: Cardiovascular Disease

## 2024-08-10 ENCOUNTER — Encounter: Payer: Self-pay | Admitting: Cardiovascular Disease

## 2024-08-10 ENCOUNTER — Ambulatory Visit: Attending: Cardiovascular Disease | Admitting: Cardiovascular Disease

## 2024-08-10 VITALS — BP 210/68 | HR 57 | Ht 64.0 in | Wt 188.2 lb

## 2024-08-10 DIAGNOSIS — I2510 Atherosclerotic heart disease of native coronary artery without angina pectoris: Secondary | ICD-10-CM | POA: Diagnosis not present

## 2024-08-10 DIAGNOSIS — I10 Essential (primary) hypertension: Secondary | ICD-10-CM

## 2024-08-10 DIAGNOSIS — I739 Peripheral vascular disease, unspecified: Secondary | ICD-10-CM | POA: Diagnosis not present

## 2024-08-10 NOTE — Patient Instructions (Signed)
 Medication Instructions:  The current medical regimen is effective;  continue present plan and medications.  *If you need a refill on your cardiac medications before your next appointment, please call your pharmacy*  Follow-Up: At [facility], you and your health needs are our priority.  As part of our continuing mission to provide you with exceptional heart care, our providers are all part of one team.  This team includes your primary Cardiologist (physician) and Advanced Practice Providers or APPs (Physician Assistants and Nurse Practitioners) who all work together to provide you with the care you need, when you need it.  Your next appointment:   1 year(s)  Provider:   Maude Emmer, MD    We recommend signing up for the patient portal called MyChart.  Sign up information is provided on this After Visit Summary.  MyChart is used to connect with patients for Virtual Visits (Telemedicine).  Patients are able to view lab/test results, encounter notes, upcoming appointments, etc.  Non-urgent messages can be sent to your provider as well.   To learn more about what you can do with MyChart, go to [email or web address].

## 2024-08-21 ENCOUNTER — Other Ambulatory Visit: Payer: Self-pay | Admitting: Cardiovascular Disease

## 2024-09-02 DIAGNOSIS — E039 Hypothyroidism, unspecified: Secondary | ICD-10-CM | POA: Diagnosis not present

## 2024-09-02 DIAGNOSIS — E1165 Type 2 diabetes mellitus with hyperglycemia: Secondary | ICD-10-CM | POA: Diagnosis not present

## 2024-09-03 ENCOUNTER — Other Ambulatory Visit: Payer: Self-pay | Admitting: Cardiovascular Disease

## 2024-09-09 DIAGNOSIS — I10 Essential (primary) hypertension: Secondary | ICD-10-CM | POA: Diagnosis not present

## 2024-09-09 DIAGNOSIS — Z23 Encounter for immunization: Secondary | ICD-10-CM | POA: Diagnosis not present

## 2024-09-09 DIAGNOSIS — E782 Mixed hyperlipidemia: Secondary | ICD-10-CM | POA: Diagnosis not present

## 2024-09-09 DIAGNOSIS — E1121 Type 2 diabetes mellitus with diabetic nephropathy: Secondary | ICD-10-CM | POA: Diagnosis not present

## 2024-09-09 DIAGNOSIS — E039 Hypothyroidism, unspecified: Secondary | ICD-10-CM | POA: Diagnosis not present

## 2024-09-09 DIAGNOSIS — I25118 Atherosclerotic heart disease of native coronary artery with other forms of angina pectoris: Secondary | ICD-10-CM | POA: Diagnosis not present

## 2024-09-24 DIAGNOSIS — I10 Essential (primary) hypertension: Secondary | ICD-10-CM | POA: Diagnosis not present

## 2024-10-07 DIAGNOSIS — I10 Essential (primary) hypertension: Secondary | ICD-10-CM | POA: Diagnosis not present

## 2024-10-07 DIAGNOSIS — E1121 Type 2 diabetes mellitus with diabetic nephropathy: Secondary | ICD-10-CM | POA: Diagnosis not present

## 2024-10-07 DIAGNOSIS — E1165 Type 2 diabetes mellitus with hyperglycemia: Secondary | ICD-10-CM | POA: Diagnosis not present

## 2024-11-02 ENCOUNTER — Other Ambulatory Visit: Payer: Self-pay | Admitting: Cardiovascular Disease

## 2024-11-22 ENCOUNTER — Other Ambulatory Visit: Payer: Self-pay

## 2024-11-22 ENCOUNTER — Encounter (HOSPITAL_COMMUNITY): Payer: Self-pay

## 2024-11-22 ENCOUNTER — Emergency Department (HOSPITAL_COMMUNITY)

## 2024-11-22 ENCOUNTER — Inpatient Hospital Stay (HOSPITAL_COMMUNITY)
Admission: EM | Admit: 2024-11-22 | Discharge: 2024-11-25 | DRG: 281 | Disposition: A | Discharge status: Home / Self Care | Attending: Internal Medicine | Admitting: Internal Medicine

## 2024-11-22 DIAGNOSIS — Z794 Long term (current) use of insulin: Secondary | ICD-10-CM

## 2024-11-22 DIAGNOSIS — R3989 Other symptoms and signs involving the genitourinary system: Secondary | ICD-10-CM | POA: Diagnosis present

## 2024-11-22 DIAGNOSIS — I10 Essential (primary) hypertension: Secondary | ICD-10-CM | POA: Diagnosis present

## 2024-11-22 DIAGNOSIS — N179 Acute kidney failure, unspecified: Secondary | ICD-10-CM | POA: Diagnosis present

## 2024-11-22 DIAGNOSIS — Z96652 Presence of left artificial knee joint: Secondary | ICD-10-CM | POA: Diagnosis present

## 2024-11-22 DIAGNOSIS — R079 Chest pain, unspecified: Secondary | ICD-10-CM | POA: Diagnosis present

## 2024-11-22 DIAGNOSIS — R7989 Other specified abnormal findings of blood chemistry: Secondary | ICD-10-CM | POA: Diagnosis present

## 2024-11-22 DIAGNOSIS — I255 Ischemic cardiomyopathy: Secondary | ICD-10-CM | POA: Diagnosis present

## 2024-11-22 DIAGNOSIS — Z7901 Long term (current) use of anticoagulants: Secondary | ICD-10-CM

## 2024-11-22 DIAGNOSIS — I5032 Chronic diastolic (congestive) heart failure: Secondary | ICD-10-CM | POA: Diagnosis present

## 2024-11-22 DIAGNOSIS — I2510 Atherosclerotic heart disease of native coronary artery without angina pectoris: Secondary | ICD-10-CM | POA: Diagnosis present

## 2024-11-22 DIAGNOSIS — Z825 Family history of asthma and other chronic lower respiratory diseases: Secondary | ICD-10-CM

## 2024-11-22 DIAGNOSIS — K551 Chronic vascular disorders of intestine: Secondary | ICD-10-CM | POA: Diagnosis present

## 2024-11-22 DIAGNOSIS — F32A Depression, unspecified: Secondary | ICD-10-CM | POA: Diagnosis present

## 2024-11-22 DIAGNOSIS — Z7902 Long term (current) use of antithrombotics/antiplatelets: Secondary | ICD-10-CM

## 2024-11-22 DIAGNOSIS — M159 Polyosteoarthritis, unspecified: Secondary | ICD-10-CM | POA: Diagnosis present

## 2024-11-22 DIAGNOSIS — I214 Non-ST elevation (NSTEMI) myocardial infarction: Principal | ICD-10-CM | POA: Diagnosis present

## 2024-11-22 DIAGNOSIS — Z823 Family history of stroke: Secondary | ICD-10-CM

## 2024-11-22 DIAGNOSIS — I471 Supraventricular tachycardia, unspecified: Secondary | ICD-10-CM | POA: Diagnosis present

## 2024-11-22 DIAGNOSIS — E119 Type 2 diabetes mellitus without complications: Secondary | ICD-10-CM

## 2024-11-22 DIAGNOSIS — I110 Hypertensive heart disease with heart failure: Secondary | ICD-10-CM | POA: Diagnosis present

## 2024-11-22 DIAGNOSIS — Z880 Allergy status to penicillin: Secondary | ICD-10-CM

## 2024-11-22 DIAGNOSIS — Z90710 Acquired absence of both cervix and uterus: Secondary | ICD-10-CM

## 2024-11-22 DIAGNOSIS — N1831 Chronic kidney disease, stage 3a: Secondary | ICD-10-CM | POA: Diagnosis present

## 2024-11-22 DIAGNOSIS — E871 Hypo-osmolality and hyponatremia: Secondary | ICD-10-CM | POA: Diagnosis present

## 2024-11-22 DIAGNOSIS — I3139 Other pericardial effusion (noninflammatory): Secondary | ICD-10-CM | POA: Diagnosis present

## 2024-11-22 DIAGNOSIS — Z8249 Family history of ischemic heart disease and other diseases of the circulatory system: Secondary | ICD-10-CM

## 2024-11-22 DIAGNOSIS — E669 Obesity, unspecified: Secondary | ICD-10-CM | POA: Diagnosis present

## 2024-11-22 DIAGNOSIS — E1165 Type 2 diabetes mellitus with hyperglycemia: Secondary | ICD-10-CM | POA: Diagnosis present

## 2024-11-22 DIAGNOSIS — Z83438 Family history of other disorder of lipoprotein metabolism and other lipidemia: Secondary | ICD-10-CM

## 2024-11-22 DIAGNOSIS — E1122 Type 2 diabetes mellitus with diabetic chronic kidney disease: Secondary | ICD-10-CM | POA: Diagnosis present

## 2024-11-22 DIAGNOSIS — K5730 Diverticulosis of large intestine without perforation or abscess without bleeding: Secondary | ICD-10-CM | POA: Diagnosis present

## 2024-11-22 DIAGNOSIS — Z79890 Hormone replacement therapy (postmenopausal): Secondary | ICD-10-CM

## 2024-11-22 DIAGNOSIS — Z6832 Body mass index (BMI) 32.0-32.9, adult: Secondary | ICD-10-CM

## 2024-11-22 DIAGNOSIS — I480 Paroxysmal atrial fibrillation: Secondary | ICD-10-CM | POA: Diagnosis present

## 2024-11-22 DIAGNOSIS — I482 Chronic atrial fibrillation, unspecified: Secondary | ICD-10-CM

## 2024-11-22 DIAGNOSIS — E039 Hypothyroidism, unspecified: Secondary | ICD-10-CM | POA: Diagnosis present

## 2024-11-22 DIAGNOSIS — Z7982 Long term (current) use of aspirin: Secondary | ICD-10-CM

## 2024-11-22 DIAGNOSIS — Z79899 Other long term (current) drug therapy: Secondary | ICD-10-CM

## 2024-11-22 DIAGNOSIS — I701 Atherosclerosis of renal artery: Secondary | ICD-10-CM | POA: Diagnosis present

## 2024-11-22 DIAGNOSIS — Z833 Family history of diabetes mellitus: Secondary | ICD-10-CM

## 2024-11-22 DIAGNOSIS — E785 Hyperlipidemia, unspecified: Secondary | ICD-10-CM | POA: Diagnosis present

## 2024-11-22 DIAGNOSIS — Z955 Presence of coronary angioplasty implant and graft: Secondary | ICD-10-CM

## 2024-11-22 DIAGNOSIS — Z87440 Personal history of urinary (tract) infections: Secondary | ICD-10-CM

## 2024-11-22 LAB — I-STAT CHEM 8, ED
BUN: 28 mg/dL — ABNORMAL HIGH (ref 8–23)
Calcium, Ion: 1.15 mmol/L (ref 1.15–1.40)
Chloride: 106 mmol/L (ref 98–111)
Creatinine, Ser: 1.2 mg/dL — ABNORMAL HIGH (ref 0.44–1.00)
Glucose, Bld: 275 mg/dL — ABNORMAL HIGH (ref 70–99)
HCT: 39 % (ref 36.0–46.0)
Hemoglobin: 13.3 g/dL (ref 12.0–15.0)
Potassium: 4.3 mmol/L (ref 3.5–5.1)
Sodium: 138 mmol/L (ref 135–145)
TCO2: 22 mmol/L (ref 22–32)

## 2024-11-22 LAB — COMPREHENSIVE METABOLIC PANEL WITH GFR
ALT: 8 U/L (ref 0–44)
AST: 18 U/L (ref 15–41)
Albumin: 4.2 g/dL (ref 3.5–5.0)
Alkaline Phosphatase: 81 U/L (ref 38–126)
Anion gap: 14 (ref 5–15)
BUN: 26 mg/dL — ABNORMAL HIGH (ref 8–23)
CO2: 20 mmol/L — ABNORMAL LOW (ref 22–32)
Calcium: 9.8 mg/dL (ref 8.9–10.3)
Chloride: 102 mmol/L (ref 98–111)
Creatinine, Ser: 1.15 mg/dL — ABNORMAL HIGH (ref 0.44–1.00)
GFR, Estimated: 47 mL/min — ABNORMAL LOW
Glucose, Bld: 281 mg/dL — ABNORMAL HIGH (ref 70–99)
Potassium: 4.5 mmol/L (ref 3.5–5.1)
Sodium: 135 mmol/L (ref 135–145)
Total Bilirubin: 0.4 mg/dL (ref 0.0–1.2)
Total Protein: 7.8 g/dL (ref 6.5–8.1)

## 2024-11-22 LAB — TROPONIN T, HIGH SENSITIVITY: Troponin T High Sensitivity: 43 ng/L — ABNORMAL HIGH (ref 0–19)

## 2024-11-22 LAB — LIPASE, BLOOD: Lipase: 23 U/L (ref 11–51)

## 2024-11-22 MED ORDER — FENTANYL CITRATE (PF) 50 MCG/ML IJ SOSY
50.0000 ug | PREFILLED_SYRINGE | Freq: Once | INTRAMUSCULAR | Status: AC
  Administered 2024-11-23: 50 ug via INTRAVENOUS
  Filled 2024-11-22: qty 1

## 2024-11-22 MED ORDER — NITROGLYCERIN 2 % TD OINT
1.0000 [in_us] | TOPICAL_OINTMENT | Freq: Once | TRANSDERMAL | Status: AC
  Administered 2024-11-23: 1 [in_us] via TOPICAL
  Filled 2024-11-22: qty 1

## 2024-11-22 MED ORDER — ASPIRIN 81 MG PO CHEW: 324.0000 mg | CHEWABLE_TABLET | Freq: Once | ORAL | Status: DC

## 2024-11-22 NOTE — ED Triage Notes (Addendum)
 Pt to ED via [facility] from home c/o CP that started 1.5 hour ago, while at rest watching tv. Rates pain 10/10, radiating to back. Took 324 aspirin  at home. And 0.4 nitroglycerin  with no relief.  EKG- Afib., hx MI and cardiac stents.   #20 Left hand  2 additional nitroglycerin  given by EMS, continues to rate pain 7/10  200/ 76, 94%RA, HR 88. .   Denies [facility]

## 2024-11-22 NOTE — ED Provider Notes (Signed)
 " [place] EMERGENCY DEPARTMENT AT [facility] Provider Note  CSN: [id number] Arrival date & time: 11/22/24 2250  Chief Complaint(s) Chest Pain  History provided by patient. HPI & MDM Danielle Murray is a 84 y.o. female .   Chest Pain Pain location:  Substernal area Pain quality: dull and sharp   Pain quality: not stabbing   Pain radiates to:  Upper back Pain severity:  Severe Onset quality:  Sudden Duration:  2 hours Timing:  Constant Progression:  Partially resolved Chronicity:  New Context: at rest   Relieved by:  Nothing Worsened by:  Nothing Ineffective treatments:  Nitroglycerin  Associated symptoms: diaphoresis and dizziness   Associated symptoms: no cough, no fever, no nausea, no shortness of breath and no vomiting   Risk factors: coronary artery disease, diabetes mellitus, high cholesterol, hypertension and obesity   Risk factors: no smoking    Clinical Course as of 11/23/24 0301  Sun Nov 22, 2024  2330 Chest pain DDx considered and work up below:  Considering ACS, dissection, GERD/esophageal spasm.  Less suspicious for pulmonary embolism. Workup will also assess for pneumothorax and pneumonia.  EKG with new A.Fib at 64bpm. Mild inferior elevation with minimal reciprocal changes.  Similar to prior tracings, but slightly more prominent.  Elevations do not meet STEMI criteria. Not concerning for pericarditis.  Chest x-ray without evidence of pneumonia, pneumothorax, pulmonary edema or pleural effusions. [PC]  Mon Nov 23, 2024  0155 Patient ruled out for aortic dissection, PE, pneumothorax, pneumonia.  Initial troponin slightly elevated at 43. Pain improved with nitroglycerin  paste and dose of fentanyl .  Still having mild pain.  Additional fentanyl  doses given.  Patient will be started on heparin  drip for possible NSTEMI and new onset A-fib.  Will consult cardiology and discussed case with medicine for admission.  [PC]  0155 Additionally, CT showed  air within the urinary bladder.  Patient does not have any suprapubic tenderness on palpation.  She denies any recent catheterization.  She did report recently being treated for urinary tract infection last month.  Will check a urine to rule out infection.  If negative, colovesicular fistula is possible.  [PC]  0157 Spoke with Dr. Michiel, cardiology fellow, who will evaluate patient during admission. [PC]  0208 Delta trop is trending up to 128.  [PC]  0236 Spoke with Dr. Marcene from Hospitalist service who will admit.  [PC]    Clinical Course User Index [PC] Abrish Erny, Raynell Moder, MD   Medical Decision Making Amount and/or Complexity of Data Reviewed Labs: ordered. Decision-making details documented in ED Course. Radiology: ordered and independent interpretation performed. Decision-making details documented in ED Course. ECG/medicine tests: ordered and independent interpretation performed. Decision-making details documented in ED Course.  Risk OTC drugs. Prescription drug management. Decision regarding hospitalization.    Final Clinical Impression(s) / ED Diagnoses Final diagnoses:  NSTEMI (non-ST elevated myocardial infarction) (HCC)  Chronic atrial fibrillation [facility])     Past Medical History Past Medical History:  Diagnosis Date   Arthritis    fingers, all my joints (09/03/2013)   Coronary artery disease    a. 08/2013: unstable angina s/p PTCA/DES to LAD, medical therapy for residual severe stenosis of a mid-distal left PDA branch of large dominant LCx, moderate RCA disease (consider PCI of L-PDA if she fails med rx).   DEPRESSION    Diverticulosis    Hyperlipidemia    Hypertension    HYPOTHYROIDISM    Preseptal cellulitis of right upper eyelid 03/10/2016   SVT (  supraventricular tachycardia)    a. very brief transient SVT during 08/2013 admission overnight.   Type II diabetes mellitus (HCC)    Dr Tommas   Patient Active Problem List   Diagnosis Date  Noted   Acute on chronic combined systolic and diastolic HF (heart failure) (HCC)    Pressure injury of skin 06/20/2022   NSTEMI (non-ST elevated myocardial infarction) (HCC) 06/19/2022   Status post surgery 06/02/2022   Closed intertrochanteric fracture of hip, left, initial encounter (HCC) 06/01/2022   Early stage nonexudative age-related macular degeneration of both eyes 04/23/2022   Hypertensive retinopathy of right eye 03/28/2020   Posterior vitreous detachment of left eye 03/28/2020   Mild nonproliferative diabetic retinopathy of both eyes (HCC) 03/28/2020   Posterior vitreous detachment of right eye 03/28/2020   Peripheral arterial disease 02/04/2019   CAD (coronary artery disease), native coronary artery 09/04/2013   Exertional angina 09/04/2013   SVT (supraventricular tachycardia) 09/04/2013   Uncontrolled type 2 diabetes mellitus with hyperglycemia, with long-term current use of insulin  (HCC) 08/07/2012   Diverticulosis 08/07/2012   Dyslipidemia 01/09/2008   Essential hypertension 01/09/2008   Hypothyroidism 10/24/2007   DEPRESSION 10/24/2007   Home Medication(s) Prior to Admission medications  Medication Sig Start Date End Date Taking? Authorizing Provider  levothyroxine  (SYNTHROID ) 75 MCG tablet Take 75 mcg by mouth every morning. 10/30/24  Yes [provider]  rosuvastatin  (CRESTOR ) 10 MG tablet Take 10 mg by mouth daily. 11/03/24  Yes [provider]  acetaminophen  (TYLENOL ) 325 MG tablet Take 2 tablets (650 mg total) by mouth every 4 (four) hours as needed for headache or mild pain. 06/28/22   Fairy Frames, MD  aspirin  81 MG chewable tablet Chew 1 tablet (81 mg total) by mouth daily. 06/28/22   Fairy Frames, MD  calcium  citrate (CALCITRATE - DOSED IN MG ELEMENTAL CALCIUM ) 950 (200 Ca) MG tablet Take 200 mg of elemental calcium  by mouth daily.    [provider]  carvedilol  (COREG ) 6.25 MG tablet TAKE 1 TABLET BY MOUTH  TWICE DAILY WITH A MEAL 08/03/24   Nishan, Peter C, MD  Cholecalciferol (VITAMIN D3 PO) Take 1 tablet by mouth daily.    [provider]  clopidogrel  (PLAVIX ) 75 MG tablet Take 1 tablet (75 mg total) by mouth daily. 11/03/24   Nishan, Peter C, MD  furosemide  (LASIX ) 40 MG tablet Take 1 tablet (40 mg total) by mouth daily. 08/09/22   Nishan, Peter C, MD  isosorbide  mononitrate (IMDUR ) 30 MG 24 hr tablet Take 1 tablet (30 mg total) by mouth daily. 08/25/24   Delford Maude BROCKS, MD  levothyroxine  (SYNTHROID , LEVOTHROID) 88 MCG tablet Take 88 mcg by mouth at bedtime.     [provider]  losartan  (COZAAR ) 25 MG tablet Take 0.5 tablets (12.5 mg total) by mouth daily. 09/07/24   Nishan, Peter C, MD  Menthol, Topical Analgesic, (BIOFREEZE ROLL-ON) 4 % GEL Apply topically as needed. 07/05/22   [provider]  nitroGLYCERIN  (NITROSTAT ) 0.4 MG SL tablet Place 1 tablet (0.4 mg total) under the tongue every 5 (five) minutes as needed for chest pain (3 doses max). 08/04/18   Nishan, Peter C, MD  NOVOLIN  70/30 KWIKPEN (70-30) 100 UNIT/ML KwikPen Inject 20 Units into the skin in the morning and at bedtime. Take 30 units in the morning and Take 50 units at bedtime Patient taking differently: Inject 20 Units into the skin daily at 6 (six) AM. Take 30 units in the morning 06/28/22   Fairy,  Sigurd, MD  rosuvastatin  (CRESTOR ) 5 MG tablet Take 1 tablet (5 mg total) by mouth daily. 10/14/20   Delford Maude BROCKS, MD  spironolactone  (ALDACTONE ) 25 MG tablet Take 1/2 (one-half) tablet by mouth once daily 07/14/24   Nishan, Peter C, MD                                                                                                                                    Allergies Penicillins  Review of Systems Review of Systems  Constitutional:  Positive for diaphoresis. Negative for fever.  Respiratory:  Negative for cough and shortness of breath.   Cardiovascular:  Positive for chest pain.  Gastrointestinal:   Negative for nausea and vomiting.  Neurological:  Positive for dizziness.   As noted in HPI  Physical Exam Vital Signs  I have reviewed the triage vital signs BP (!) 164/71   Pulse 64   Temp 98.7 F (37.1 C) (Oral)   Resp 19   Ht 5' 4 (1.626 m)   Wt 81.6 kg   SpO2 97%   BMI 30.90 kg/m   Physical Exam Vitals reviewed.  Constitutional:      General: She is not in acute distress.    Appearance: She is well-developed. She is obese. She is not diaphoretic.  HENT:     Head: Normocephalic and atraumatic.     Nose: Nose normal.  Eyes:     General: No scleral icterus.       Right eye: No discharge.        Left eye: No discharge.     Conjunctiva/sclera: Conjunctivae normal.     Pupils: Pupils are equal, round, and reactive to light.  Cardiovascular:     Rate and Rhythm: Bradycardia present. Rhythm irregularly irregular.     Heart sounds: No murmur heard.    No friction rub. No gallop.  Pulmonary:     Effort: Pulmonary effort is normal. No respiratory distress.     Breath sounds: Normal breath sounds. No stridor. No rales.  Abdominal:     General: There is no distension.     Palpations: Abdomen is soft.     Tenderness: There is no abdominal tenderness.  Musculoskeletal:        General: No tenderness.     Cervical back: Normal range of motion and neck supple.     Right lower leg: 1+ Edema present.     Left lower leg: 2+ Edema present.  Skin:    General: Skin is warm and dry.     Findings: No erythema or rash.  Neurological:     Mental Status: She is alert and oriented to person, place, and time.     ED Results and Treatments Labs (all labs ordered are listed, but only abnormal results are displayed) Labs Reviewed  COMPREHENSIVE METABOLIC PANEL WITH GFR - Abnormal; Notable for the following components:      Result Value  CO2 20 (*)    Glucose, Bld 281 (*)    BUN 26 (*)    Creatinine, Ser 1.15 (*)    GFR, Estimated 47 (*)    All other components within normal  limits  I-STAT CHEM 8, ED - Abnormal; Notable for the following components:   BUN 28 (*)    Creatinine, Ser 1.20 (*)    Glucose, Bld 275 (*)    All other components within normal limits  TROPONIN T, HIGH SENSITIVITY - Abnormal; Notable for the following components:   Troponin T High Sensitivity 43 (*)    All other components within normal limits  TROPONIN T, HIGH SENSITIVITY - Abnormal; Notable for the following components:   Troponin T High Sensitivity 128 (*)    All other components within normal limits  LIPASE, BLOOD  CBC WITH DIFFERENTIAL/PLATELET  URINALYSIS, W/ REFLEX TO CULTURE (INFECTION SUSPECTED)  CBG MONITORING, ED                                                                                                                         EKG  EKG Interpretation Date/Time:  Sunday November 22 2024 22:56:59 EST Ventricular Rate:  64 PR Interval:    QRS Duration:  76 QT Interval:  429 QTC Calculation: 447 R Axis:   -4  Text Interpretation: Atrial fibrillation Anterior infarct, old Nonspecific repol abnormality, lateral leads Minimal ST elevation, inferior leads Confirmed by Trine Likes [phone]) on 11/22/2024 11:03:28 PM       Radiology CT Angio Chest/Abd/Pel for Dissection W and/or Wo Contrast Result Date: 11/23/2024 EXAM: CTA CHEST, ABDOMEN AND PELVIS WITH AND WITHOUT CONTRAST 11/23/2024 12:57:00 AM TECHNIQUE: CTA of the chest was performed with and without the administration of 100 mL of intravenous iohexol  (OMNIPAQUE ) 350 MG/ML injection. CTA of the abdomen and pelvis was performed with and without the administration of 100 mL of intravenous iohexol  (OMNIPAQUE ) 350 MG/ML injection. Multiplanar reformatted images are provided for review. MIP images are provided for review. Automated exposure control, iterative reconstruction, and/or weight based adjustment of the mA/kV was utilized to reduce the radiation dose to as low as reasonably achievable. COMPARISON: Prior study dated  08/XX/2023. CLINICAL HISTORY: Acute aortic syndrome (AAS). Chest pain. FINDINGS: VASCULATURE: AORTA: Extensive streak artifact related to contrast within the superior vena cava results in artifactual filling defects within the ascending thoracic aorta. No intramural hematoma, dissection, or thoracic aortic aneurysm. Moderate atherosclerotic calcification within the thoracic aorta. No abdominal aortic aneurysm. PULMONARY ARTERIES: Extensive streak artifact related to contrast within the superior vena cava results in artifactual filling defects within the central pulmonary arterial tree. No definite pulmonary embolism. GREAT VESSELS OF AORTIC ARCH: No acute finding. No dissection. No arterial occlusion or significant stenosis. CELIAC TRUNK: No acute finding. No occlusion or significant stenosis. SUPERIOR MESENTERIC ARTERY: No acute finding. No occlusion or significant stenosis. INFERIOR MESENTERIC ARTERY: Greater than 75% stenosis of the inferior mesenteric artery at its origin. This is of questionable clinical significance given  the wide patency of the remaining mesenteric vasculature. RENAL ARTERIES: Greater than 75% stenosis of the right renal artery at its origin secondary to calcified atherosclerotic plaque. Less than 50% stenosis of the left renal artery at its origin. Both renal arteries demonstrate normal vascular morphology. No aneurysm or dissection. ILIAC ARTERIES: No acute finding. No occlusion or significant stenosis. CHEST: MEDIASTINUM: Extensive multivessel coronary artery calcifications. Mild cardiomegaly. No mediastinal lymphadenopathy. The heart and pericardium demonstrate no acute abnormality. LUNGS AND PLEURA: The lungs are without acute process. No focal consolidation or pulmonary edema. No evidence of pleural effusion or pneumothorax. THORACIC BONES AND SOFT TISSUES: No acute bone or soft tissue abnormality. ABDOMEN AND PELVIS: LIVER: The liver is unremarkable. GALLBLADDER AND BILE DUCTS:  Gallbladder is unremarkable. No biliary ductal dilatation. SPLEEN: The spleen is unremarkable. PANCREAS: The pancreas is unremarkable. ADRENAL GLANDS: 10 mm nodule within the left adrenal gland is compatible with a benign adrenal adenoma for which no follow up imaging is recommended. KIDNEYS, URETERS AND BLADDER: No stones in the kidneys or ureters. No hydronephrosis. No perinephric or periureteral stranding. Moderate volume nondependent gas is seen within the bladder lumen which is nonspecific and can be seen with recent catheterization though aggressive infection or interfascicular fistula could results in similar appearance. Notably, there is effacement of the intervening fat plane between the bladder and several diverticula of the sigmoid colon involving the dome (series 263, image 6; series 136, image 10). Correlation with urinalysis and urine culture may be helpful for confirmation. GI AND BOWEL: Small hiatal hernia. There is severe descending and sigmoid colonic diverticulosis without superimposed acute inflammatory change identified. The stomach, small bowel, and large bowel are otherwise unremarkable. Appendix normal. Small fat-containing umbilical hernia. There is no bowel obstruction. No abnormal bowel wall thickening or distension. REPRODUCTIVE: Uterus absent. No adnexal masses. PERITONEUM AND RETROPERITONEUM: No ascites or free air. LYMPH NODES: No lymphadenopathy. ABDOMINAL BONES AND SOFT TISSUES: Status post left hip ORIF. Moderate-to-severe bilateral degenerative hip arthritis, asymmetrically more severe on the left. Osseous structures are otherwise age appropriate. No acute bone abnormality. No lytic or blastic bone lesion. No acute soft tissue abnormality. IMPRESSION: 1. No evidence of acute aortic syndrome. 2. No definite pulmonary embolism, limited by streak artifact. 3. Extensive multivessel coronary artery calcifications and mild cardiomegaly. 4. Greater than 75% stenosis of the right renal  artery origin due to calcified atherosclerotic plaque. Correlation for clinical evidence of hemodynamically significant renal artery stenosis is recommended. 5. Greater than 75% stenosis of the inferior mesenteric artery origin, unlikely of clinical significance given wide patency of the remaining mesenteric vasculature. . 6. Moderate nondependent intraluminal bladder gas with effacement of the fat plane between the bladder dome and adjacent sigmoid diverticula. While this is most commonly seen in the setting of recent bladder catheterization, aggressive infection or potentially choledochal fistula could result in a similar appearance. Correlation with urinalysis and urine culture would be helpful for further evaluation. 7. Severe descending and sigmoid colonic diverticulosis without acute inflammatory change. 8. Small hiatal hernia. 9. 10 mm left adrenal adenoma, with no follow-up imaging recommended. 10. Raf score includes aortic atherosclerosis (ICD10-I70.0). Electronically signed by: Dorethia Molt MD MD 11/23/2024 01:23 AM EST RP Workstation: [id number]   DG Chest Portable 1 View Result Date: 11/22/2024 EXAM: 1 VIEW(S) XRAY OF THE CHEST 11/22/2024 11:30:00 PM COMPARISON: None available. CLINICAL HISTORY: FINDINGS: LINES, TUBES AND DEVICES: EKG leads noted. LUNGS AND PLEURA: No focal pulmonary opacity. No pleural effusion. No pneumothorax. HEART AND MEDIASTINUM: Mild cardiomegaly.  Aortic atherosclerosis. BONES AND SOFT TISSUES: No acute osseous abnormality. IMPRESSION: 1. No acute findings. 2. Mild cardiomegaly and aortic atherosclerosis. Electronically signed by: Dorethia Molt MD MD 11/22/2024 11:34 PM EST RP Workstation: [id number]    Medications Ordered in ED Medications  heparin  bolus via infusion 3,000 Units (has no administration in time range)  heparin  ADULT infusion 100 units/mL (25000 units/250mL) (has no administration in time range)  fentaNYL  (SUBLIMAZE ) injection 50 mcg (50 mcg Intravenous  Given 11/23/24 0007)  nitroGLYCERIN  (NITROGLYN) 2 % ointment 1 inch (1 inch Topical Given 11/23/24 0009)  iohexol  (OMNIPAQUE ) 350 MG/ML injection 100 mL (100 mLs Intravenous Contrast Given 11/23/24 0103)  fentaNYL  (SUBLIMAZE ) injection 50 mcg (50 mcg Intravenous Given 11/23/24 0210)   Procedures .Critical Care  Performed by: Trine Raynell Moder, MD Authorized by: Trine Raynell Moder, MD   Critical care provider statement:    Critical care time (minutes):  45   Critical care time was exclusive of:  Separately billable procedures and treating other patients   Critical care was necessary to treat or prevent imminent or life-threatening deterioration of the following conditions:  Cardiac failure   Critical care was time spent personally by me on the following activities:  Development of treatment plan with patient or surrogate, discussions with consultants, evaluation of patient's response to treatment, examination of patient, obtaining history from patient or surrogate, review of old charts, re-evaluation of patient's condition, pulse oximetry, ordering and review of radiographic studies, ordering and review of laboratory studies and ordering and performing treatments and interventions   Care discussed with: admitting provider     (including critical care time)   This chart was dictated using voice recognition software.  Despite best efforts to proofread,  errors can occur which can change the documentation meaning.   Trine Raynell Moder, MD 11/23/24 0216  "

## 2024-11-23 ENCOUNTER — Emergency Department (HOSPITAL_COMMUNITY)

## 2024-11-23 ENCOUNTER — Inpatient Hospital Stay (HOSPITAL_COMMUNITY): Admission: EM | Disposition: A | Payer: Self-pay | Source: Home / Self Care | Attending: Internal Medicine

## 2024-11-23 ENCOUNTER — Encounter (HOSPITAL_COMMUNITY): Payer: Self-pay | Admitting: Internal Medicine

## 2024-11-23 ENCOUNTER — Inpatient Hospital Stay (HOSPITAL_COMMUNITY)

## 2024-11-23 DIAGNOSIS — Z7901 Long term (current) use of anticoagulants: Secondary | ICD-10-CM | POA: Diagnosis not present

## 2024-11-23 DIAGNOSIS — R7989 Other specified abnormal findings of blood chemistry: Secondary | ICD-10-CM | POA: Diagnosis present

## 2024-11-23 DIAGNOSIS — I3139 Other pericardial effusion (noninflammatory): Secondary | ICD-10-CM | POA: Diagnosis present

## 2024-11-23 DIAGNOSIS — I255 Ischemic cardiomyopathy: Secondary | ICD-10-CM | POA: Diagnosis present

## 2024-11-23 DIAGNOSIS — I471 Supraventricular tachycardia, unspecified: Secondary | ICD-10-CM | POA: Diagnosis present

## 2024-11-23 DIAGNOSIS — I110 Hypertensive heart disease with heart failure: Secondary | ICD-10-CM | POA: Diagnosis present

## 2024-11-23 DIAGNOSIS — Z7902 Long term (current) use of antithrombotics/antiplatelets: Secondary | ICD-10-CM | POA: Diagnosis not present

## 2024-11-23 DIAGNOSIS — E782 Mixed hyperlipidemia: Secondary | ICD-10-CM

## 2024-11-23 DIAGNOSIS — I2510 Atherosclerotic heart disease of native coronary artery without angina pectoris: Secondary | ICD-10-CM

## 2024-11-23 DIAGNOSIS — E1122 Type 2 diabetes mellitus with diabetic chronic kidney disease: Secondary | ICD-10-CM | POA: Diagnosis present

## 2024-11-23 DIAGNOSIS — I5032 Chronic diastolic (congestive) heart failure: Secondary | ICD-10-CM | POA: Diagnosis present

## 2024-11-23 DIAGNOSIS — E669 Obesity, unspecified: Secondary | ICD-10-CM | POA: Diagnosis present

## 2024-11-23 DIAGNOSIS — Z79890 Hormone replacement therapy (postmenopausal): Secondary | ICD-10-CM | POA: Diagnosis not present

## 2024-11-23 DIAGNOSIS — Z7982 Long term (current) use of aspirin: Secondary | ICD-10-CM | POA: Diagnosis not present

## 2024-11-23 DIAGNOSIS — E871 Hypo-osmolality and hyponatremia: Secondary | ICD-10-CM | POA: Diagnosis present

## 2024-11-23 DIAGNOSIS — K551 Chronic vascular disorders of intestine: Secondary | ICD-10-CM | POA: Diagnosis present

## 2024-11-23 DIAGNOSIS — R079 Chest pain, unspecified: Secondary | ICD-10-CM | POA: Diagnosis not present

## 2024-11-23 DIAGNOSIS — F32A Depression, unspecified: Secondary | ICD-10-CM | POA: Diagnosis present

## 2024-11-23 DIAGNOSIS — I10 Essential (primary) hypertension: Secondary | ICD-10-CM

## 2024-11-23 DIAGNOSIS — I214 Non-ST elevation (NSTEMI) myocardial infarction: Secondary | ICD-10-CM

## 2024-11-23 DIAGNOSIS — E039 Hypothyroidism, unspecified: Secondary | ICD-10-CM

## 2024-11-23 DIAGNOSIS — Z79899 Other long term (current) drug therapy: Secondary | ICD-10-CM | POA: Diagnosis not present

## 2024-11-23 DIAGNOSIS — E119 Type 2 diabetes mellitus without complications: Secondary | ICD-10-CM

## 2024-11-23 DIAGNOSIS — Z794 Long term (current) use of insulin: Secondary | ICD-10-CM | POA: Diagnosis not present

## 2024-11-23 DIAGNOSIS — E1165 Type 2 diabetes mellitus with hyperglycemia: Secondary | ICD-10-CM | POA: Diagnosis present

## 2024-11-23 DIAGNOSIS — N1831 Chronic kidney disease, stage 3a: Secondary | ICD-10-CM | POA: Diagnosis present

## 2024-11-23 DIAGNOSIS — I480 Paroxysmal atrial fibrillation: Secondary | ICD-10-CM | POA: Diagnosis present

## 2024-11-23 DIAGNOSIS — E785 Hyperlipidemia, unspecified: Secondary | ICD-10-CM | POA: Diagnosis present

## 2024-11-23 DIAGNOSIS — R3989 Other symptoms and signs involving the genitourinary system: Secondary | ICD-10-CM | POA: Diagnosis not present

## 2024-11-23 DIAGNOSIS — N179 Acute kidney failure, unspecified: Secondary | ICD-10-CM | POA: Diagnosis present

## 2024-11-23 HISTORY — PX: LEFT HEART CATH AND CORONARY ANGIOGRAPHY: CATH118249

## 2024-11-23 LAB — CBC WITH DIFFERENTIAL/PLATELET
Abs Immature Granulocytes: 0.05 K/uL (ref 0.00–0.07)
Abs Immature Granulocytes: 0.05 K/uL (ref 0.00–0.07)
Basophils Absolute: 0.1 K/uL (ref 0.0–0.1)
Basophils Absolute: 0.1 K/uL (ref 0.0–0.1)
Basophils Relative: 1 %
Basophils Relative: 1 %
Eosinophils Absolute: 0.2 K/uL (ref 0.0–0.5)
Eosinophils Absolute: 0 K/uL (ref 0.0–0.5)
Eosinophils Relative: 2 %
Eosinophils Relative: 0 %
HCT: 42.3 % (ref 36.0–46.0)
HCT: 34.5 % — ABNORMAL LOW (ref 36.0–46.0)
Hemoglobin: 13.1 g/dL (ref 12.0–15.0)
Hemoglobin: 11.2 g/dL — ABNORMAL LOW (ref 12.0–15.0)
Immature Granulocytes: 1 %
Immature Granulocytes: 1 %
Lymphocytes Relative: 15 %
Lymphocytes Relative: 12 %
Lymphs Abs: 1.3 K/uL (ref 0.7–4.0)
Lymphs Abs: 1.2 K/uL (ref 0.7–4.0)
MCH: 27.6 pg (ref 26.0–34.0)
MCH: 28.1 pg (ref 26.0–34.0)
MCHC: 31 g/dL (ref 30.0–36.0)
MCHC: 32.5 g/dL (ref 30.0–36.0)
MCV: 89.1 fL (ref 80.0–100.0)
MCV: 86.5 fL (ref 80.0–100.0)
Monocytes Absolute: 0.7 K/uL (ref 0.1–1.0)
Monocytes Absolute: 0.4 K/uL (ref 0.1–1.0)
Monocytes Relative: 8 %
Monocytes Relative: 4 %
Neutro Abs: 6.4 K/uL (ref 1.7–7.7)
Neutro Abs: 8.1 K/uL — ABNORMAL HIGH (ref 1.7–7.7)
Neutrophils Relative %: 73 %
Neutrophils Relative %: 82 %
Platelets: 324 K/uL (ref 150–400)
Platelets: 275 K/uL (ref 150–400)
RBC: 4.75 MIL/uL (ref 3.87–5.11)
RBC: 3.99 MIL/uL (ref 3.87–5.11)
RDW: 14 % (ref 11.5–15.5)
RDW: 14.1 % (ref 11.5–15.5)
WBC: 8.8 K/uL (ref 4.0–10.5)
WBC: 9.9 K/uL (ref 4.0–10.5)
nRBC: 0 % (ref 0.0–0.2)
nRBC: 0 % (ref 0.0–0.2)

## 2024-11-23 LAB — COMPREHENSIVE METABOLIC PANEL WITH GFR
ALT: 10 U/L (ref 0–44)
AST: 39 U/L (ref 15–41)
Albumin: 3.8 g/dL (ref 3.5–5.0)
Alkaline Phosphatase: 73 U/L (ref 38–126)
Anion gap: 11 (ref 5–15)
BUN: 24 mg/dL — ABNORMAL HIGH (ref 8–23)
CO2: 21 mmol/L — ABNORMAL LOW (ref 22–32)
Calcium: 9.2 mg/dL (ref 8.9–10.3)
Chloride: 103 mmol/L (ref 98–111)
Creatinine, Ser: 1.08 mg/dL — ABNORMAL HIGH (ref 0.44–1.00)
GFR, Estimated: 51 mL/min — ABNORMAL LOW
Glucose, Bld: 288 mg/dL — ABNORMAL HIGH (ref 70–99)
Potassium: 4.7 mmol/L (ref 3.5–5.1)
Sodium: 135 mmol/L (ref 135–145)
Total Bilirubin: 0.5 mg/dL (ref 0.0–1.2)
Total Protein: 6.9 g/dL (ref 6.5–8.1)

## 2024-11-23 LAB — URINALYSIS, W/ REFLEX TO CULTURE (INFECTION SUSPECTED)
Bilirubin Urine: NEGATIVE
Glucose, UA: 150 mg/dL — AB
Ketones, ur: NEGATIVE mg/dL
Nitrite: NEGATIVE
Protein, ur: 100 mg/dL — AB
Specific Gravity, Urine: 1.019 (ref 1.005–1.030)
pH: 5 (ref 5.0–8.0)

## 2024-11-23 LAB — POCT ACTIVATED CLOTTING TIME: Activated Clotting Time: 163 s

## 2024-11-23 LAB — ECHOCARDIOGRAM COMPLETE
Area-P 1/2: 3.46 cm2
Calc EF: 48.4 %
Height: 64 in
S' Lateral: 3.4 cm
Single Plane A2C EF: 55.5 %
Single Plane A4C EF: 45.2 %
Weight: 2880 [oz_av]

## 2024-11-23 LAB — MAGNESIUM: Magnesium: 2 mg/dL (ref 1.7–2.4)

## 2024-11-23 LAB — TROPONIN T, HIGH SENSITIVITY
Troponin T High Sensitivity: 128 ng/L (ref 0–19)
Troponin T High Sensitivity: 308 ng/L (ref 0–19)
Troponin T High Sensitivity: 844 ng/L (ref 0–19)

## 2024-11-23 LAB — GLUCOSE, CAPILLARY
Glucose-Capillary: 270 mg/dL — ABNORMAL HIGH (ref 70–99)
Glucose-Capillary: 267 mg/dL — ABNORMAL HIGH (ref 70–99)

## 2024-11-23 LAB — CBG MONITORING, ED
Glucose-Capillary: 285 mg/dL — ABNORMAL HIGH (ref 70–99)
Glucose-Capillary: 247 mg/dL — ABNORMAL HIGH (ref 70–99)

## 2024-11-23 LAB — HEMOGLOBIN A1C
Hgb A1c MFr Bld: 9.7 % — ABNORMAL HIGH (ref 4.8–5.6)
Mean Plasma Glucose: 231.69 mg/dL

## 2024-11-23 LAB — TSH: TSH: 0.641 u[IU]/mL (ref 0.350–4.500)

## 2024-11-23 LAB — HEPARIN LEVEL (UNFRACTIONATED): Heparin Unfractionated: 0.17 [IU]/mL — ABNORMAL LOW (ref 0.30–0.70)

## 2024-11-23 MED ORDER — LABETALOL HCL 5 MG/ML IV SOLN
10.0000 mg | INTRAVENOUS | Status: AC | PRN
  Administered 2024-11-23: 10 mg via INTRAVENOUS

## 2024-11-23 MED ORDER — HYDRALAZINE HCL 20 MG/ML IJ SOLN
INTRAMUSCULAR | Status: DC | PRN
  Administered 2024-11-23: 10 mg via INTRAVENOUS

## 2024-11-23 MED ORDER — SODIUM CHLORIDE 0.9% FLUSH
3.0000 mL | Freq: Two times a day (BID) | INTRAVENOUS | Status: DC
  Administered 2024-11-23 – 2024-11-25 (×4): 3 mL via INTRAVENOUS

## 2024-11-23 MED ORDER — HEPARIN BOLUS VIA INFUSION
3000.0000 [IU] | Freq: Once | INTRAVENOUS | Status: AC
  Administered 2024-11-23: 3000 [IU] via INTRAVENOUS
  Filled 2024-11-23: qty 3000

## 2024-11-23 MED ORDER — IOHEXOL 350 MG/ML SOLN
INTRAVENOUS | Status: DC | PRN
  Administered 2024-11-23: 40 mL

## 2024-11-23 MED ORDER — IOHEXOL 350 MG/ML SOLN
100.0000 mL | Freq: Once | INTRAVENOUS | Status: AC | PRN
  Administered 2024-11-23: 100 mL via INTRAVENOUS

## 2024-11-23 MED ORDER — HYDRALAZINE HCL 20 MG/ML IJ SOLN: 10.0000 mg | INTRAMUSCULAR | Status: AC | PRN

## 2024-11-23 MED ORDER — FENTANYL CITRATE (PF) 50 MCG/ML IJ SOSY: 50.0000 ug | PREFILLED_SYRINGE | INTRAMUSCULAR | Status: DC | PRN

## 2024-11-23 MED ORDER — CARVEDILOL 6.25 MG PO TABS
6.2500 mg | ORAL_TABLET | Freq: Two times a day (BID) | ORAL | Status: DC
  Administered 2024-11-23 – 2024-11-24 (×2): 6.25 mg via ORAL
  Filled 2024-11-23: qty 2
  Filled 2024-11-23 (×2): qty 1

## 2024-11-23 MED ORDER — LIDOCAINE HCL (PF) 1 % IJ SOLN
INTRAMUSCULAR | Status: AC
  Filled 2024-11-23: qty 30

## 2024-11-23 MED ORDER — MIDAZOLAM HCL (PF) 2 MG/2ML IJ SOLN
INTRAMUSCULAR | Status: DC | PRN
  Administered 2024-11-23: 1 mg via INTRAVENOUS

## 2024-11-23 MED ORDER — FUROSEMIDE 40 MG PO TABS
40.0000 mg | ORAL_TABLET | Freq: Every day | ORAL | Status: DC
  Administered 2024-11-23 – 2024-11-25 (×3): 40 mg via ORAL
  Filled 2024-11-23 (×2): qty 1
  Filled 2024-11-23: qty 2

## 2024-11-23 MED ORDER — HYDRALAZINE HCL 20 MG/ML IJ SOLN
INTRAMUSCULAR | Status: AC
  Filled 2024-11-23: qty 1

## 2024-11-23 MED ORDER — LIDOCAINE HCL (PF) 1 % IJ SOLN
INTRAMUSCULAR | Status: DC | PRN
  Administered 2024-11-23: 10 mL
  Administered 2024-11-23: 2 mL

## 2024-11-23 MED ORDER — HEPARIN SODIUM (PORCINE) 1000 UNIT/ML IJ SOLN
INTRAMUSCULAR | Status: AC
  Filled 2024-11-23: qty 10

## 2024-11-23 MED ORDER — HEPARIN (PORCINE) IN NACL 1000-0.9 UT/500ML-% IV SOLN
INTRAVENOUS | Status: DC | PRN
  Administered 2024-11-23: 1000 mL via SURGICAL_CAVITY

## 2024-11-23 MED ORDER — VERAPAMIL HCL 2.5 MG/ML IV SOLN
INTRAVENOUS | Status: AC
  Filled 2024-11-23: qty 2

## 2024-11-23 MED ORDER — FENTANYL CITRATE (PF) 100 MCG/2ML IJ SOLN
INTRAMUSCULAR | Status: DC | PRN
  Administered 2024-11-23: 25 ug via INTRAVENOUS

## 2024-11-23 MED ORDER — FREE WATER: 500.0000 mL | Freq: Once | Status: DC

## 2024-11-23 MED ORDER — ACETAMINOPHEN 325 MG PO TABS: 650.0000 mg | ORAL_TABLET | Freq: Four times a day (QID) | ORAL | Status: DC | PRN

## 2024-11-23 MED ORDER — ONDANSETRON HCL 4 MG/2ML IJ SOLN: 4.0000 mg | Freq: Four times a day (QID) | INTRAMUSCULAR | Status: DC | PRN

## 2024-11-23 MED ORDER — NITROGLYCERIN 0.4 MG SL SUBL
0.4000 mg | SUBLINGUAL_TABLET | SUBLINGUAL | Status: DC | PRN
  Administered 2024-11-23: 0.4 mg via SUBLINGUAL
  Filled 2024-11-23: qty 1

## 2024-11-23 MED ORDER — MELATONIN 3 MG PO TABS: 3.0000 mg | ORAL_TABLET | Freq: Every evening | ORAL | Status: DC | PRN

## 2024-11-23 MED ORDER — SODIUM CHLORIDE 0.9 % IV SOLN: INTRAVENOUS | Status: AC

## 2024-11-23 MED ORDER — HEPARIN BOLUS VIA INFUSION
2000.0000 [IU] | Freq: Once | INTRAVENOUS | Status: AC
  Administered 2024-11-23: 2000 [IU] via INTRAVENOUS
  Filled 2024-11-23: qty 2000

## 2024-11-23 MED ORDER — NITROGLYCERIN 1 MG/10 ML FOR IR/CATH LAB
INTRA_ARTERIAL | Status: AC
  Filled 2024-11-23: qty 10

## 2024-11-23 MED ORDER — ACETAMINOPHEN 650 MG RE SUPP: 650.0000 mg | Freq: Four times a day (QID) | RECTAL | Status: DC | PRN

## 2024-11-23 MED ORDER — SODIUM CHLORIDE 0.9 % IV SOLN
1.0000 g | INTRAVENOUS | Status: DC
  Administered 2024-11-23 – 2024-11-24 (×2): 1 g via INTRAVENOUS
  Filled 2024-11-23 (×3): qty 10

## 2024-11-23 MED ORDER — SODIUM CHLORIDE 0.9 % IV SOLN: 250.0000 mL | INTRAVENOUS | Status: AC | PRN

## 2024-11-23 MED ORDER — NITROGLYCERIN 1 MG/10 ML FOR IR/CATH LAB
INTRA_ARTERIAL | Status: DC | PRN
  Administered 2024-11-23: 200 ug via INTRACORONARY

## 2024-11-23 MED ORDER — HEPARIN (PORCINE) 25000 UT/250ML-% IV SOLN
1050.0000 [IU]/h | INTRAVENOUS | Status: DC
  Administered 2024-11-23: 850 [IU]/h via INTRAVENOUS
  Filled 2024-11-23: qty 250

## 2024-11-23 MED ORDER — SODIUM CHLORIDE 0.9% FLUSH: 3.0000 mL | INTRAVENOUS | Status: DC | PRN

## 2024-11-23 MED ORDER — INSULIN GLARGINE-YFGN 100 UNIT/ML ~~LOC~~ SOLN
10.0000 [IU] | Freq: Two times a day (BID) | SUBCUTANEOUS | Status: DC
  Administered 2024-11-23 – 2024-11-25 (×5): 10 [IU] via SUBCUTANEOUS
  Filled 2024-11-23 (×6): qty 0.1

## 2024-11-23 MED ORDER — ASPIRIN 81 MG PO CHEW
81.0000 mg | CHEWABLE_TABLET | ORAL | Status: AC
  Administered 2024-11-23: 81 mg via ORAL
  Filled 2024-11-23: qty 1

## 2024-11-23 MED ORDER — INSULIN ASPART 100 UNIT/ML IJ SOLN
0.0000 [IU] | Freq: Four times a day (QID) | INTRAMUSCULAR | Status: DC
  Administered 2024-11-23 (×2): 5 [IU] via SUBCUTANEOUS
  Administered 2024-11-23: 3 [IU] via SUBCUTANEOUS
  Administered 2024-11-24 (×2): 2 [IU] via SUBCUTANEOUS
  Filled 2024-11-23: qty 2
  Filled 2024-11-23: qty 5
  Filled 2024-11-23: qty 2
  Filled 2024-11-23: qty 3
  Filled 2024-11-23: qty 5

## 2024-11-23 MED ORDER — MIDAZOLAM HCL 2 MG/2ML IJ SOLN
INTRAMUSCULAR | Status: AC
  Filled 2024-11-23: qty 2

## 2024-11-23 MED ORDER — LABETALOL HCL 5 MG/ML IV SOLN
INTRAVENOUS | Status: AC
  Filled 2024-11-23: qty 4

## 2024-11-23 MED ORDER — PERFLUTREN LIPID MICROSPHERE
1.0000 mL | INTRAVENOUS | Status: AC | PRN
  Administered 2024-11-23: 2 mL via INTRAVENOUS

## 2024-11-23 MED ORDER — FENTANYL CITRATE (PF) 50 MCG/ML IJ SOSY
50.0000 ug | PREFILLED_SYRINGE | Freq: Once | INTRAMUSCULAR | Status: AC
  Administered 2024-11-23: 50 ug via INTRAVENOUS
  Filled 2024-11-23: qty 1

## 2024-11-23 MED ORDER — FENTANYL CITRATE (PF) 100 MCG/2ML IJ SOLN
INTRAMUSCULAR | Status: AC
  Filled 2024-11-23: qty 2

## 2024-11-23 MED ORDER — CLOPIDOGREL BISULFATE 75 MG PO TABS
75.0000 mg | ORAL_TABLET | Freq: Every day | ORAL | Status: DC
  Administered 2024-11-24 – 2024-11-25 (×2): 75 mg via ORAL
  Filled 2024-11-23 (×2): qty 1

## 2024-11-23 NOTE — ED Notes (Signed)
 Pt to CT

## 2024-11-23 NOTE — Consult Note (Addendum)
 "  Cardiology Consultation   Patient ID: Danielle Murray MRN: [id number]; DOB: October 18, 1941  Admit date: 11/22/2024 Date of Consult: 11/23/2024  PCP:  Clarice Nottingham, MD   [facility] Providers Cardiologist:  Maude Emmer, MD        Patient Profile: Danielle Murray is a 84 y.o. female with a hx of multivessel CAD, prior PCI/DES to LAD (jailed D1), ostial Lcx, HFimpEF 2/2 ICM (35-40% -> 60-65%), T2DM, HTN, HLD, hypothyroidism who is being seen 11/23/2024 for the evaluation of NSTEMI at the request of Dr. Marcene.  History of Present Illness: Danielle Murray states that she was watching TV this evening and around 9pm experienced sudden onset midsternal sharp chest pain with radiation to her back. The sharp nature of the pain eventually gave way to a more dull chest pressure that persisted. Denied any anginal symptoms in the days/weeks leading up to admission. She took 324mg  ASA and took sl nitroglycerin  at home without significant relief so she called EMS and was brought to the ED for further evaluation. Notes strict adherence to her prescribed plavix .   In the ED, she was given additional sl nitroglycerin , fentanyl  push, and nitroglycerin  paste. On my evaluation, she is chest pain free. Hypertensive, with systolic pressures in the 180-190s. Labs notable for hs-troponin 43 - 128. She underwent a CTA chest which was negative for aortic dissection. Found on EKG here to be in atrial fibrillation, which is a new diagnosis for her.    Past Medical History:  Diagnosis Date   Arthritis    fingers, all my joints (09/03/2013)   Coronary artery disease    a. 08/2013: unstable angina s/p PTCA/DES to LAD, medical therapy for residual severe stenosis of a mid-distal left PDA branch of large dominant LCx, moderate RCA disease (consider PCI of L-PDA if she fails med rx).   DEPRESSION    Diverticulosis    Hyperlipidemia    Hypertension    HYPOTHYROIDISM    Preseptal cellulitis of right upper eyelid  03/10/2016   SVT (supraventricular tachycardia)    a. very brief transient SVT during 08/2013 admission overnight.   Type II diabetes mellitus (HCC)    Dr Tommas    Past Surgical History:  Procedure Laterality Date   ABDOMINAL HYSTERECTOMY  1989   no BSO; dysfunctional menses   CATARACT EXTRACTION W/ INTRAOCULAR LENS  IMPLANT, BILATERAL Bilateral 2014   CATARACT EXTRACTION W/PHACO Right 07/02/2013   CATARACT EXTRACTION W/PHACO Left 06/01/2013   COLONOSCOPY  2003   Tics   CORONARY ANGIOPLASTY WITH STENT PLACEMENT  09/03/2013   1 (09/03/2013)   CORONARY LITHOTRIPSY N/A 06/25/2022   Procedure: CORONARY LITHOTRIPSY;  Surgeon: Claudene Victory ORN, MD;  Location: [facility] INVASIVE CV LAB;  Service: Cardiovascular;  Laterality: N/A;   CORONARY STENT INTERVENTION N/A 06/25/2022   Procedure: CORONARY STENT INTERVENTION;  Surgeon: Claudene Victory ORN, MD;  Location: [facility] INVASIVE CV LAB;  Service: Cardiovascular;  Laterality: N/A;   CORONARY ULTRASOUND/IVUS N/A 06/25/2022   Procedure: Intravascular Ultrasound/IVUS;  Surgeon: Claudene Victory ORN, MD;  Location: [facility] INVASIVE CV LAB;  Service: Cardiovascular;  Laterality: N/A;   FEMUR IM NAIL Left 06/02/2022   Procedure: INTRAMEDULLARY (IM) NAIL FEMORAL;  Surgeon: Edna Toribio LABOR, MD;  Location: [facility] OR;  Service: Orthopedics;  Laterality: Left;   LEFT HEART CATHETERIZATION WITH CORONARY ANGIOGRAM N/A 09/03/2013   Procedure: LEFT HEART CATHETERIZATION WITH CORONARY ANGIOGRAM;  Surgeon: Ozell JONETTA Fell, MD;  Location: [facility] CATH LAB;  Service: Cardiovascular;  Laterality: N/A;  RIGHT/LEFT HEART CATH AND CORONARY ANGIOGRAPHY N/A 06/21/2022   Procedure: RIGHT/LEFT HEART CATH AND CORONARY ANGIOGRAPHY;  Surgeon: Claudene Victory ORN, MD;  Location: [facility] INVASIVE CV LAB;  Service: Cardiovascular;  Laterality: N/A;   TOTAL KNEE ARTHROPLASTY Left 06/2005   Dr Heide       Scheduled Meds:  fentaNYL  (SUBLIMAZE ) injection  50 mcg Intravenous Once   heparin   3,000 Units Intravenous Once    Continuous Infusions:  heparin      PRN Meds:   Allergies:   Allergies[1]  Social History:   Social History   Socioeconomic History   Marital status: Married    Spouse name: Not on file   Number of children: Not on file   Years of education: Not on file   Highest education level: Not on file  Occupational History   Not on file  Tobacco Use   Smoking status: Never   Smokeless tobacco: Never  Vaping Use   Vaping status: Never Used  Substance and Sexual Activity   Alcohol use: Yes    Comment: 09/03/2013 1-2 times/yr   Drug use: No   Sexual activity: Yes  Other Topics Concern   Not on file  Social History Narrative   Not on file   Social Drivers of Health   Tobacco Use: Low Risk (11/22/2024)   Patient History    Smoking Tobacco Use: Never    Smokeless Tobacco Use: Never    Passive Exposure: Not on file  Financial Resource Strain: Not on file  Food Insecurity: Not on file  Transportation Needs: Not on file  Physical Activity: Not on file  Stress: Not on file  Social Connections: Not on file  Intimate Partner Violence: Not on file  Depression (EYV7-0): Not on file  Alcohol Screen: Not on file  Housing: Not on file  Utilities: Not on file  Health Literacy: Not on file    Family History:   Reviewed, noncontributory Family History  Problem Relation Age of Onset   Asthma Mother    Hypertension Mother    Heart failure Mother    Hypertension Father    Stroke Father 45   Heart attack Father 54   Hypertension Sister    Hyperlipidemia Sister    Heart attack Sister        pre 50; Twin   Diabetes Sister        her TWIN   Hypertension Brother    Hyperlipidemia Brother    Coronary artery disease Brother        stents late 28s   Breast cancer Neg Hx      ROS:  Please see the history of present illness.   All other ROS reviewed and negative.     Physical Exam/Data: Vitals:   11/22/24 2253 11/22/24 2257 11/23/24 0130 11/23/24 0145  BP:  (!) 194/53  (!) 165/89 (!) 164/71  Pulse:  71 77 64  Resp:  18 (!) 22 19  Temp:  98.7 F (37.1 C)    TempSrc:  Oral    SpO2:  100% 100% 97%  Weight: 81.6 kg     Height: 5' 4 (1.626 m)      No intake or output data in the 24 hours ending 11/23/24 0204    11/22/2024   10:53 PM 08/10/2024    8:39 AM 06/25/2023    8:33 AM  Last 3 Weights  Weight (lbs) 180 lb 188 lb 3.2 oz 181 lb  Weight (kg) 81.647 kg 85.367 kg 82.101 kg  Body mass index is 30.9 kg/m.   General:  Well nourished, well developed, in no acute distress HEENT: normal Neck: 4-5cm JVD Vascular: No carotid bruits; Distal pulses 2+ bilaterally Cardiac:  normal S1, S2; irregularly irregular rate and rhythm; no murmur Lungs:  clear to auscultation bilaterally, no wheezing, rhonchi or rales  Abd: soft, nontender, no hepatomegaly  Ext: 1+ BLE edema Musculoskeletal:  No deformities, BUE and BLE strength normal and equal Skin: warm and dry  Neuro:  CNs 2-12 intact, no focal abnormalities noted Psych:  Normal affect   EKG:  The EKG was personally reviewed and demonstrates:  Atrial fibrillation, HR 64bpm. Anterior/anteroseptal Q waves, submilimeter inferior ST elevation on initial EKG that improved on serial EKG obtained a few hours later  Telemetry:  Telemetry was personally reviewed and demonstrates:  Atrial fibrillation  Relevant CV Studies:  Limited TTE 09/2022: IMPRESSIONS   1. Left ventricular ejection fraction, by estimation, is 60 to 65%. The  left ventricle has normal function. There is moderate concentric left  ventricular hypertrophy. Left ventricular diastolic parameters are  consistent with Grade II diastolic  dysfunction (pseudonormalization).   2. Right ventricular systolic function is normal. The right ventricular  size is normal.   3. The mitral valve is degenerative. Mild mitral valve regurgitation.   TTE 06/2022: IMPRESSIONS   1. No left ventricular thrombus is seen (Definity  contrast was used).  Wall motion  suggests infarction due to occlusion of the mid-LAD artery or  a large ramus intermedius vessel that reaches the lateral apex. Left  ventricular ejection fraction, by  estimation, is 35 to 40%. The left ventricle has moderately decreased  function. The left ventricle demonstrates regional wall motion  abnormalities (see scoring diagram/findings for description). There is  mild concentric left ventricular hypertrophy. Left   ventricular diastolic parameters are consistent with Grade II diastolic  dysfunction (pseudonormalization). Elevated left atrial pressure.   2. Right ventricular systolic function is normal. The right ventricular  size is normal. There is normal pulmonary artery systolic pressure. The  estimated right ventricular systolic pressure is 34.6 mmHg.   3. Left atrial size was severely dilated.   4. The mitral valve is degenerative. Mild to moderate mitral valve  regurgitation. No evidence of mitral stenosis. The mean mitral valve  gradient is 3.1 mmHg. Moderate mitral annular calcification.   5. Tricuspid valve regurgitation is mild to moderate.   6. The aortic valve is tricuspid. Aortic valve regurgitation is not  visualized. No aortic stenosis is present.   R/L Cardiac Catheterization 06/21/2022: CONCLUSIONS: Moderate three-vessel coronary disease involving the distal nondominant right coronary, the large first diagonal jailed by previous LAD stent, obtuse marginal and PDA disease, and ostial to proximal circumflex. Moderate pulmonary hypertension with mean PA pressure 30 mmHg, likely WHO group 2 with capillary wedge pressure of 26, LVEDP 31 mm, pulmonary vascular resistance 2.3 Wood units.  Left Anterior Descending  Vessel is small. There is mild diffuse disease throughout the vessel.  Prox LAD to Mid LAD lesion is 30% stenosed. The lesion was previously treated .    First Diagonal Branch  Vessel is small in size.  1st Diag lesion is 90% stenosed.    Left Circumflex   Ost Cx to Prox Cx lesion is 70% stenosed.    First Obtuse Marginal Branch  1st Mrg lesion is 70% stenosed.    Third Left Posterolateral Branch  Vessel is large in size.  3rd LPL lesion is 70% stenosed.    Right Coronary  Artery  Vessel is small.  Ost RCA lesion is 55% stenosed.  Mid RCA lesion is 70% stenosed.     Cardiac Catheterization 06/25/2022: Conclusions: Successful shockwave assisted stent of the ostial to proximal circumflex reducing 70% stenosis to 10% with TIMI grade III flow.  4.0 x 15 mm stent postdilated to 4.5 cm in diameter with no apparent complications.   RECOMMENDATIONS: Aspirin  and Plavix  for 6 months and then consider monotherapy with clopidogrel  thereafter.  Laboratory Data: High Sensitivity Troponin:  No results for input(s): TROPONINIHS in the last 720 hours.  Recent Labs  Lab 11/22/24 2313 11/23/24 0131  TRNPT 43* 128*      Chemistry Recent Labs  Lab 11/22/24 2313 11/22/24 2321  NA 135 138  K 4.5 4.3  CL 102 106  CO2 20*  --   GLUCOSE 281* 275*  BUN 26* 28*  CREATININE 1.15* 1.20*  CALCIUM  9.8  --   GFRNONAA 47*  --   ANIONGAP 14  --     Recent Labs  Lab 11/22/24 2313  PROT 7.8  ALBUMIN 4.2  AST 18  ALT 8  ALKPHOS 81  BILITOT 0.4   Lipids No results for input(s): CHOL, TRIG, HDL, LABVLDL, LDLCALC, CHOLHDL in the last 168 hours.  Hematology Recent Labs  Lab 11/22/24 2313 11/22/24 2321  WBC 8.8  --   RBC 4.75  --   HGB 13.1 13.3  HCT 42.3 39.0  MCV 89.1  --   MCH 27.6  --   MCHC 31.0  --   RDW 14.0  --   PLT 324  --    Thyroid  No results for input(s): TSH, FREET4 in the last 168 hours.  BNPNo results for input(s): BNP, PROBNP in the last 168 hours.  DDimer No results for input(s): DDIMER in the last 168 hours.  Radiology/Studies:  CT Angio Chest/Abd/Pel for Dissection W and/or Wo Contrast Result Date: 11/23/2024 EXAM: CTA CHEST, ABDOMEN AND PELVIS WITH AND WITHOUT CONTRAST 11/23/2024 12:57:00  AM TECHNIQUE: CTA of the chest was performed with and without the administration of 100 mL of intravenous iohexol  (OMNIPAQUE ) 350 MG/ML injection. CTA of the abdomen and pelvis was performed with and without the administration of 100 mL of intravenous iohexol  (OMNIPAQUE ) 350 MG/ML injection. Multiplanar reformatted images are provided for review. MIP images are provided for review. Automated exposure control, iterative reconstruction, and/or weight based adjustment of the mA/kV was utilized to reduce the radiation dose to as low as reasonably achievable. COMPARISON: Prior study dated 08/XX/2023. CLINICAL HISTORY: Acute aortic syndrome (AAS). Chest pain. FINDINGS: VASCULATURE: AORTA: Extensive streak artifact related to contrast within the superior vena cava results in artifactual filling defects within the ascending thoracic aorta. No intramural hematoma, dissection, or thoracic aortic aneurysm. Moderate atherosclerotic calcification within the thoracic aorta. No abdominal aortic aneurysm. PULMONARY ARTERIES: Extensive streak artifact related to contrast within the superior vena cava results in artifactual filling defects within the central pulmonary arterial tree. No definite pulmonary embolism. GREAT VESSELS OF AORTIC ARCH: No acute finding. No dissection. No arterial occlusion or significant stenosis. CELIAC TRUNK: No acute finding. No occlusion or significant stenosis. SUPERIOR MESENTERIC ARTERY: No acute finding. No occlusion or significant stenosis. INFERIOR MESENTERIC ARTERY: Greater than 75% stenosis of the inferior mesenteric artery at its origin. This is of questionable clinical significance given the wide patency of the remaining mesenteric vasculature. RENAL ARTERIES: Greater than 75% stenosis of the right renal artery at its origin secondary to calcified atherosclerotic plaque. Less than 50% stenosis  of the left renal artery at its origin. Both renal arteries demonstrate normal vascular morphology. No  aneurysm or dissection. ILIAC ARTERIES: No acute finding. No occlusion or significant stenosis. CHEST: MEDIASTINUM: Extensive multivessel coronary artery calcifications. Mild cardiomegaly. No mediastinal lymphadenopathy. The heart and pericardium demonstrate no acute abnormality. LUNGS AND PLEURA: The lungs are without acute process. No focal consolidation or pulmonary edema. No evidence of pleural effusion or pneumothorax. THORACIC BONES AND SOFT TISSUES: No acute bone or soft tissue abnormality. ABDOMEN AND PELVIS: LIVER: The liver is unremarkable. GALLBLADDER AND BILE DUCTS: Gallbladder is unremarkable. No biliary ductal dilatation. SPLEEN: The spleen is unremarkable. PANCREAS: The pancreas is unremarkable. ADRENAL GLANDS: 10 mm nodule within the left adrenal gland is compatible with a benign adrenal adenoma for which no follow up imaging is recommended. KIDNEYS, URETERS AND BLADDER: No stones in the kidneys or ureters. No hydronephrosis. No perinephric or periureteral stranding. Moderate volume nondependent gas is seen within the bladder lumen which is nonspecific and can be seen with recent catheterization though aggressive infection or interfascicular fistula could results in similar appearance. Notably, there is effacement of the intervening fat plane between the bladder and several diverticula of the sigmoid colon involving the dome (series 263, image 6; series 136, image 10). Correlation with urinalysis and urine culture may be helpful for confirmation. GI AND BOWEL: Small hiatal hernia. There is severe descending and sigmoid colonic diverticulosis without superimposed acute inflammatory change identified. The stomach, small bowel, and large bowel are otherwise unremarkable. Appendix normal. Small fat-containing umbilical hernia. There is no bowel obstruction. No abnormal bowel wall thickening or distension. REPRODUCTIVE: Uterus absent. No adnexal masses. PERITONEUM AND RETROPERITONEUM: No ascites or free  air. LYMPH NODES: No lymphadenopathy. ABDOMINAL BONES AND SOFT TISSUES: Status post left hip ORIF. Moderate-to-severe bilateral degenerative hip arthritis, asymmetrically more severe on the left. Osseous structures are otherwise age appropriate. No acute bone abnormality. No lytic or blastic bone lesion. No acute soft tissue abnormality. IMPRESSION: 1. No evidence of acute aortic syndrome. 2. No definite pulmonary embolism, limited by streak artifact. 3. Extensive multivessel coronary artery calcifications and mild cardiomegaly. 4. Greater than 75% stenosis of the right renal artery origin due to calcified atherosclerotic plaque. Correlation for clinical evidence of hemodynamically significant renal artery stenosis is recommended. 5. Greater than 75% stenosis of the inferior mesenteric artery origin, unlikely of clinical significance given wide patency of the remaining mesenteric vasculature. . 6. Moderate nondependent intraluminal bladder gas with effacement of the fat plane between the bladder dome and adjacent sigmoid diverticula. While this is most commonly seen in the setting of recent bladder catheterization, aggressive infection or potentially choledochal fistula could result in a similar appearance. Correlation with urinalysis and urine culture would be helpful for further evaluation. 7. Severe descending and sigmoid colonic diverticulosis without acute inflammatory change. 8. Small hiatal hernia. 9. 10 mm left adrenal adenoma, with no follow-up imaging recommended. 10. Raf score includes aortic atherosclerosis (ICD10-I70.0). Electronically signed by: Dorethia Molt MD MD 11/23/2024 01:23 AM EST RP Workstation: [id number]   DG Chest Portable 1 View Result Date: 11/22/2024 EXAM: 1 VIEW(S) XRAY OF THE CHEST 11/22/2024 11:30:00 PM COMPARISON: None available. CLINICAL HISTORY: FINDINGS: LINES, TUBES AND DEVICES: EKG leads noted. LUNGS AND PLEURA: No focal pulmonary opacity. No pleural effusion. No  pneumothorax. HEART AND MEDIASTINUM: Mild cardiomegaly. Aortic atherosclerosis. BONES AND SOFT TISSUES: No acute osseous abnormality. IMPRESSION: 1. No acute findings. 2. Mild cardiomegaly and aortic atherosclerosis. Electronically signed by: Dorethia Molt MD MD 11/22/2024 11:34  PM EST RP Workstation: [id number]     Assessment and Plan: NSTEMI Multivessel CAD, prior PCI/DES to LAD (jailed D1) 2014, shockwave assisted PCI/DES to ostial Lcx 2023  HFimpEF 2/2 ICM, LVEF 35-40% -> 60-65% most recently assessed 09/2022 Newly diagnosed atrial fibrillation, rate controlled HTN HLD T2DM  Cardiology consulted on this 61F with known multivessel CAD, prior PCI/DES to both LAD and ostial Lcx. History concerning for ACS and rules in for MI. There are subtle submilimeter ST changes in the inferior leads though not meeting formal STEMI criteria and improved on serial EKG obtained a few hours later. Patient currently chest pain free. Tentative plan for LHC later today. She is also noted to be in atrial fibrillation which is a new diagnosis for her, fortunately rate controlled but likely will require short duration of triple therapy if coronary intervention performed.   Plan - Trop trend 43 - 128 as of the writing of this note. Please trend trop to peak - s/p ASA 324mg  load at home, patient states strictly adherent to PTA plavix  75mg  daily. Continue DAPT with aspirin  81mg  daily, plavix  75mg  daily - Started on ACS heparin  in the ED. Given new diagnosis of atrial fibrillation and elevated CHADS2VASC score of 7, patient will require indefinite anticoagulation (and potentially a short duration of triple therapy if coronary intervention performed)  - Further management of atrial fibrillation following resolution of MI. Continue PTA coreg , along with other cardiac medications - High-intensity statin, patient currently on crestor  10mg  which can be increased to high-intensity dosing 20 vs. 40mg  daily - Please obtain TSH,  A1c, lipid panel here - TTE in AM - NPO, tentative plan for LHC in AM. Serial EKGs through the evening  Risk Assessment/Risk Scores:  TIMI Risk Score for Unstable Angina or Non-ST Elevation MI:   The patient's TIMI risk score is 5, which indicates a 26% risk of all cause mortality, new or recurrent myocardial infarction or need for urgent revascularization in the next 14 days.{  CHA2DS2-VASc Score =   7       For questions or updates, please contact [facility] Please consult [email or web address] for contact info under   Signed, Franky GORMAN Earthly, MD  11/23/2024 2:04 AM  ------------------------------------------------------------------------------------------------   Danielle Murray was seen by me today along with Franky Earthly, MD. I have personally performed an evaluation on this patient.  My findings are as follows: 84 y.o. female w/hypertension, hyperlipidemia, type 2 DM, CAD w/prior PCIs, ischemic cardiomyopathy with improved LVEF, admitted with chest pain, NSTEMI.  Patient lives with her husband, activity limited by hip and knee pain, needs help from husband for ADLs. Patient had sharp retrosternal pain 11/22/2024 around 9 PM while watching TV, did not resolve with multiple SL NTG, lasted for several hours, currently comfortable without any significant chest pain. At baseline, does not have chest pain or dyspnea with limited activity, does reported stable leg pain. She has had recent UTI.  Blood pressure elevated. Reportedly compliant at home, but A1C is 9.6%. Last seen by Dr. Delford in 07/2024.  Data: EKG(s) and pertinent labs, studies, etc were personally reviewed and interpreted by me:  EKG 11/23/2024: Afib 66 bpm Old anterior and inferior infarct  Labs Trop HS 43->128->308 Cr 1.08, Hb 11.2 Otherwise, I agree with data as outlined by the advanced practice provider.  CTA C/A/P 11/2023: 1. No evidence of acute aortic syndrome. 2. No definite pulmonary embolism, limited by  streak artifact. 3. Extensive multivessel coronary artery calcifications and mild  cardiomegaly. 4. Greater than 75% stenosis of the right renal artery origin due to calcified atherosclerotic plaque. Correlation for clinical evidence of hemodynamically significant renal artery stenosis is recommended. 5. Greater than 75% stenosis of the inferior mesenteric artery origin, unlikely of clinical significance given wide patency of the remaining mesenteric vasculature. . 6. Moderate nondependent intraluminal bladder gas with effacement of the fat plane between the bladder dome and adjacent sigmoid diverticula. While this is most commonly seen in the setting of recent bladder catheterization, aggressive infection or potentially choledochal fistula could result in a similar appearance. Correlation with urinalysis and urine culture would be helpful for further evaluation. 7. Severe descending and sigmoid colonic diverticulosis without acute inflammatory change. 8. Small hiatal hernia. 9. 10 mm left adrenal adenoma, with no follow-up imaging recommended. 10. Raf score includes aortic atherosclerosis (ICD10-I70.0).  Echocardiogram 09/2022:  1. Left ventricular ejection fraction, by estimation, is 60 to 65%. The  left ventricle has normal function. There is moderate concentric left  ventricular hypertrophy. Left ventricular diastolic parameters are  consistent with Grade II diastolic  dysfunction (pseudonormalization).   2. Right ventricular systolic function is normal. The right ventricular  size is normal.   3. The mitral valve is degenerative. Mild mitral valve regurgitation.   Comparison(s): No significant change from prior study.   Coronary angiography and intervention 06/2022: Successful shockwave assisted stent of the ostial to proximal circumflex reducing 70% stenosis to 10% with TIMI grade III flow.  4.0 x 15 mm stent postdilated to 4.5 cm in diameter with no apparent complications.     Moderate three-vessel coronary disease involving the distal nondominant right coronary, the large first diagonal jailed by previous LAD stent, obtuse marginal and PDA disease, and ostial to proximal circumflex. Moderate pulmonary hypertension with mean PA pressure 30 mmHg, likely WHO group 2 with capillary wedge pressure of 26, LVEDP 31 mm, pulmonary vascular resistance 2.3 Wood units.    Exam performed by me: No acute distress Normal heart sounds, no murmur No rales No JVD 1+ b/l pitting leg edema   My Assessment and Plan: 84 y.o. female w/hypertension, hyperlipidemia, type 2 DM, CAD w/prior PCIs, ischemic cardiomyopathy with improved LVEF, admitted with chest pain, NSTEMI, new diagnosis Afib, Rt renal artery stenosis  NSTEMI: Chest pain on presentation on 11/22/2024 9 PM, currently chest pain-free. High sensitive troponin 300s.  PE or acute aortic syndrome ruled out on CTA. Blood pressure elevated, which could be possibly contributing, see below regarding hypertension management. No dyspnea symptoms, but does have bilateral 1+ pitting edema.  Needs echocardiogram. Continue aspirin , heparin . Resume home medication carvedilol , hold losartan  and spironolactone , as well as Imdur , resume Lasix . Currently on Crestor  10 mg daily, check lipid panel. Will plan on performing heart catheterization later today with possible intervention.  Atrial fibrillation: New finding, rate controlled. Will need outpatient management. After cath, will need initiation of DOAC, Eliquis  5 mg bid. Patient was on Aspirin , Plavix  at home. If she has PCI, will likely need triple therapy with Aspirin , Plavix , and Eliquis  for potentially up to 1 month, then stop Aspirin .   Hypertension: Uncontrolled, on Coreg  6.25 mg twice daily, losartan  12.5 mg daily, spironolactone  12.5 mg daily, Imdur  30 mg daily. 75% right renal artery stenosis, could also be contributing. Uptitrate medical management during this  hospitalization.  If blood pressure remains uncontrolled, could consider outpatient workup and management for renal artery stenosis.  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 Bladder  abnormalities could related to recent UTI, defer management to primary team.  Type 2 diabetes mellitus: Uncontrolled, A1c 9.7%. Goal A1c <7%.  Defer management to primary team.    Signed,  Newman JINNY Lawrence, MD  11/23/2024 8:39 AM       [1]  Allergies Allergen Reactions   Penicillins Anaphylaxis    Diffuse joint pain @ age 61 in context of Scarlet Fever  Did it involve swelling of the face/tongue/throat, SOB, or low BP? No  Did it involve sudden or severe rash/hives, skin peeling, or any reaction on the inside of your mouth or nose? No  Did you need to seek medical attention at a hospital or doctor's office? No  When did it last happen?  childhood      If all above answers are NO, may proceed with cephalosporin use.   "

## 2024-11-23 NOTE — Progress Notes (Signed)
 PHARMACY - ANTICOAGULATION CONSULT NOTE  Pharmacy Consult for Heparin   Indication: atrial fibrillation, rule out ACS  Allergies[1]  Patient Measurements: Height: 5' 4 (162.6 cm) Weight: 81.6 kg (180 lb) IBW/kg (Calculated) : 54.7 HEPARIN  DW (KG): 72.4  Vital Signs: Temp: 98 F (36.7 C) (01/12 0951) Temp Source: Oral (01/12 0951) BP: 192/111 (01/12 1000) Pulse Rate: 92 (01/12 1000)  Labs: Recent Labs    11/22/24 2313 11/22/24 2321 11/23/24 0354 11/23/24 1210  HGB 13.1 13.3 11.2*  --   HCT 42.3 39.0 34.5*  --   PLT 324  --  275  --   HEPARINUNFRC  --   --   --  0.17*  CREATININE 1.15* 1.20* 1.08*  --     Estimated Creatinine Clearance: 40.8 mL/min (A) (by C-G formula based on SCr of 1.08 mg/dL (H)).   Assessment: 84 y/o F presents to the ED with chest pain, found to be in afib, also with mildly elevated troponin. Pharmacy consulted for heparin  gtt.   HL 0.17 - subtherapeutic Hgb 11.2, Plt 25 this AM   Goal of Therapy:  Heparin  level 0.3-0.7 units/ml Monitor platelets by anticoagulation protocol: Yes   Plan:  Heparin  2000 units IV x1 as bolus followed by:  Increase heparin  gtt to 1050 units/hr Heparin  level in 8 hours Daily CBC/Heparin  level F/u s/sx bleeding and cardiac cath plans  Sharyne Glatter, PharmD, BCCCP Critical Care Clinical Pharmacist 11/23/2024 12:49 PM      [1]  Allergies Allergen Reactions   Penicillins Anaphylaxis    Diffuse joint pain @ age 8 in context of Scarlet Fever  Did it involve swelling of the face/tongue/throat, SOB, or low BP? No  Did it involve sudden or severe rash/hives, skin peeling, or any reaction on the inside of your mouth or nose? No  Did you need to seek medical attention at a hospital or doctor's office? No  When did it last happen?  childhood      If all above answers are NO, may proceed with cephalosporin use.

## 2024-11-23 NOTE — Interval H&P Note (Signed)
 History and Physical Interval Note:  11/23/2024 2:51 PM  Danielle Murray  has presented today for surgery, with the diagnosis of NSTEMI.  The various methods of treatment have been discussed with the patient and family. After consideration of risks, benefits and other options for treatment, the patient has consented to  Procedures: LEFT HEART CATH AND CORONARY ANGIOGRAPHY (N/A) as a surgical intervention.  The patient's history has been reviewed, patient examined, no change in status, stable for surgery.  I have reviewed the patient's chart and labs.  Questions were answered to the patient's satisfaction.    Cath Lab Visit (complete for each Cath Lab visit)  Clinical Evaluation Leading to the Procedure:   ACS: Yes.    Non-ACS:  N/A  Makara Lanzo

## 2024-11-23 NOTE — Progress Notes (Signed)
 Brief same day note:  Patient is a 84 year old female with history of coronary disease status post PCI with drug-eluting stent, hypertension, hyperlipidemia, hypothyroidism, type 2 diabetes who presented with chest pain from home.  Chest pain was sharp in nature with radiation to the back .  On plantation, she was given sublingual nitroglycerin , fentanyl .  Chest pain completely resolved.  She remained mildly hypertensive in the ED.  Initial troponin was 343 which increased to the range of 300s.  Started on heparin  drip for the suspicion of NSTEMI.  EKG did not show any ST changes.  Cardiology following.  Patient seen and examined at bedside this morning in the emergency department.  During my evaluation, she remains hemodynamically stable.  EKG monitor shows A-fib rhythm with controlled rate.  Denies chest pain.  On room air.  Has lower extremity edema.  Husband at bedside.  Assessment and plan:  NSTEMI: Presented with chest pain.  History of coronary artery disease.  Elevated troponin with positive delta.  On heparin  drip.  Cardiology following.  Echo has been ordered.  Cardiology planning for cardiac cath  New onset A-fib: New problem.  Rate controlled.  EKG showed A-fib rhythm.  Cardiology following.  Already on heparin  drip.  TSH normal  Pneumaturia: Incidentally noted on CTA chest.  History of recent UTI .we will check UA/urine culture.  No abdominal discomfort.  Abdomen is benign on examination.  No dysuria.  Hypertension: Takes Imdur , Coreg , Lasix , losartan  at home.  Remains hypertensive  Hyperlipidemia: On rosuvastatin   Hypothyroidism: Continue Synthyroid  Type 2 diabetes: Takes 70/30 insulin  at home.  Monitor blood sugars. Recent A1c of 7.5.

## 2024-11-23 NOTE — Progress Notes (Signed)
" ° °  Brief Progress Note   _____________________________________________________________________________________________________________  Patient Name: Danielle Murray Patient DOB: 1941/01/23 Date: @TODAY @      Data: Reviewed notes, labs, VS.    Action: No action needed at this time.     Response:    _____________________________________________________________________________________________________________  [facility] RN Expeditor Sharolyn JONETTA Batman Please contact us  directly via secure chat (search for [facility]) or by calling us  at [phone] [facility]).  "

## 2024-11-23 NOTE — H&P (View-Only) (Signed)
 "  Cardiology Consultation   Patient ID: Danielle Murray MRN: [id number]; DOB: October 18, 1941  Admit date: 11/22/2024 Date of Consult: 11/23/2024  PCP:  Clarice Nottingham, MD   [facility] Providers Cardiologist:  Maude Emmer, MD        Patient Profile: Danielle Murray is a 84 y.o. female with a hx of multivessel CAD, prior PCI/DES to LAD (jailed D1), ostial Lcx, HFimpEF 2/2 ICM (35-40% -> 60-65%), T2DM, HTN, HLD, hypothyroidism who is being seen 11/23/2024 for the evaluation of NSTEMI at the request of Dr. Marcene.  History of Present Illness: Ms. Danielle Murray states that she was watching TV this evening and around 9pm experienced sudden onset midsternal sharp chest pain with radiation to her back. The sharp nature of the pain eventually gave way to a more dull chest pressure that persisted. Denied any anginal symptoms in the days/weeks leading up to admission. She took 324mg  ASA and took sl nitroglycerin  at home without significant relief so she called EMS and was brought to the ED for further evaluation. Notes strict adherence to her prescribed plavix .   In the ED, she was given additional sl nitroglycerin , fentanyl  push, and nitroglycerin  paste. On my evaluation, she is chest pain free. Hypertensive, with systolic pressures in the 180-190s. Labs notable for hs-troponin 43 - 128. She underwent a CTA chest which was negative for aortic dissection. Found on EKG here to be in atrial fibrillation, which is a new diagnosis for her.    Past Medical History:  Diagnosis Date   Arthritis    fingers, all my joints (09/03/2013)   Coronary artery disease    a. 08/2013: unstable angina s/p PTCA/DES to LAD, medical therapy for residual severe stenosis of a mid-distal left PDA branch of large dominant LCx, moderate RCA disease (consider PCI of L-PDA if she fails med rx).   DEPRESSION    Diverticulosis    Hyperlipidemia    Hypertension    HYPOTHYROIDISM    Preseptal cellulitis of right upper eyelid  03/10/2016   SVT (supraventricular tachycardia)    a. very brief transient SVT during 08/2013 admission overnight.   Type II diabetes mellitus (HCC)    Dr Tommas    Past Surgical History:  Procedure Laterality Date   ABDOMINAL HYSTERECTOMY  1989   no BSO; dysfunctional menses   CATARACT EXTRACTION W/ INTRAOCULAR LENS  IMPLANT, BILATERAL Bilateral 2014   CATARACT EXTRACTION W/PHACO Right 07/02/2013   CATARACT EXTRACTION W/PHACO Left 06/01/2013   COLONOSCOPY  2003   Tics   CORONARY ANGIOPLASTY WITH STENT PLACEMENT  09/03/2013   1 (09/03/2013)   CORONARY LITHOTRIPSY N/A 06/25/2022   Procedure: CORONARY LITHOTRIPSY;  Surgeon: Claudene Victory ORN, MD;  Location: [facility] INVASIVE CV LAB;  Service: Cardiovascular;  Laterality: N/A;   CORONARY STENT INTERVENTION N/A 06/25/2022   Procedure: CORONARY STENT INTERVENTION;  Surgeon: Claudene Victory ORN, MD;  Location: [facility] INVASIVE CV LAB;  Service: Cardiovascular;  Laterality: N/A;   CORONARY ULTRASOUND/IVUS N/A 06/25/2022   Procedure: Intravascular Ultrasound/IVUS;  Surgeon: Claudene Victory ORN, MD;  Location: [facility] INVASIVE CV LAB;  Service: Cardiovascular;  Laterality: N/A;   FEMUR IM NAIL Left 06/02/2022   Procedure: INTRAMEDULLARY (IM) NAIL FEMORAL;  Surgeon: Edna Toribio LABOR, MD;  Location: [facility] OR;  Service: Orthopedics;  Laterality: Left;   LEFT HEART CATHETERIZATION WITH CORONARY ANGIOGRAM N/A 09/03/2013   Procedure: LEFT HEART CATHETERIZATION WITH CORONARY ANGIOGRAM;  Surgeon: Ozell JONETTA Fell, MD;  Location: [facility] CATH LAB;  Service: Cardiovascular;  Laterality: N/A;  RIGHT/LEFT HEART CATH AND CORONARY ANGIOGRAPHY N/A 06/21/2022   Procedure: RIGHT/LEFT HEART CATH AND CORONARY ANGIOGRAPHY;  Surgeon: Claudene Victory ORN, MD;  Location: [facility] INVASIVE CV LAB;  Service: Cardiovascular;  Laterality: N/A;   TOTAL KNEE ARTHROPLASTY Left 06/2005   Dr Heide       Scheduled Meds:  fentaNYL  (SUBLIMAZE ) injection  50 mcg Intravenous Once   heparin   3,000 Units Intravenous Once    Continuous Infusions:  heparin      PRN Meds:   Allergies:   Allergies[1]  Social History:   Social History   Socioeconomic History   Marital status: Married    Spouse name: Not on file   Number of children: Not on file   Years of education: Not on file   Highest education level: Not on file  Occupational History   Not on file  Tobacco Use   Smoking status: Never   Smokeless tobacco: Never  Vaping Use   Vaping status: Never Used  Substance and Sexual Activity   Alcohol use: Yes    Comment: 09/03/2013 1-2 times/yr   Drug use: No   Sexual activity: Yes  Other Topics Concern   Not on file  Social History Narrative   Not on file   Social Drivers of Health   Tobacco Use: Low Risk (11/22/2024)   Patient History    Smoking Tobacco Use: Never    Smokeless Tobacco Use: Never    Passive Exposure: Not on file  Financial Resource Strain: Not on file  Food Insecurity: Not on file  Transportation Needs: Not on file  Physical Activity: Not on file  Stress: Not on file  Social Connections: Not on file  Intimate Partner Violence: Not on file  Depression (EYV7-0): Not on file  Alcohol Screen: Not on file  Housing: Not on file  Utilities: Not on file  Health Literacy: Not on file    Family History:   Reviewed, noncontributory Family History  Problem Relation Age of Onset   Asthma Mother    Hypertension Mother    Heart failure Mother    Hypertension Father    Stroke Father 45   Heart attack Father 54   Hypertension Sister    Hyperlipidemia Sister    Heart attack Sister        pre 50; Twin   Diabetes Sister        her TWIN   Hypertension Brother    Hyperlipidemia Brother    Coronary artery disease Brother        stents late 28s   Breast cancer Neg Hx      ROS:  Please see the history of present illness.   All other ROS reviewed and negative.     Physical Exam/Data: Vitals:   11/22/24 2253 11/22/24 2257 11/23/24 0130 11/23/24 0145  BP:  (!) 194/53  (!) 165/89 (!) 164/71  Pulse:  71 77 64  Resp:  18 (!) 22 19  Temp:  98.7 F (37.1 C)    TempSrc:  Oral    SpO2:  100% 100% 97%  Weight: 81.6 kg     Height: 5' 4 (1.626 m)      No intake or output data in the 24 hours ending 11/23/24 0204    11/22/2024   10:53 PM 08/10/2024    8:39 AM 06/25/2023    8:33 AM  Last 3 Weights  Weight (lbs) 180 lb 188 lb 3.2 oz 181 lb  Weight (kg) 81.647 kg 85.367 kg 82.101 kg  Body mass index is 30.9 kg/m.   General:  Well nourished, well developed, in no acute distress HEENT: normal Neck: 4-5cm JVD Vascular: No carotid bruits; Distal pulses 2+ bilaterally Cardiac:  normal S1, S2; irregularly irregular rate and rhythm; no murmur Lungs:  clear to auscultation bilaterally, no wheezing, rhonchi or rales  Abd: soft, nontender, no hepatomegaly  Ext: 1+ BLE edema Musculoskeletal:  No deformities, BUE and BLE strength normal and equal Skin: warm and dry  Neuro:  CNs 2-12 intact, no focal abnormalities noted Psych:  Normal affect   EKG:  The EKG was personally reviewed and demonstrates:  Atrial fibrillation, HR 64bpm. Anterior/anteroseptal Q waves, submilimeter inferior ST elevation on initial EKG that improved on serial EKG obtained a few hours later  Telemetry:  Telemetry was personally reviewed and demonstrates:  Atrial fibrillation  Relevant CV Studies:  Limited TTE 09/2022: IMPRESSIONS   1. Left ventricular ejection fraction, by estimation, is 60 to 65%. The  left ventricle has normal function. There is moderate concentric left  ventricular hypertrophy. Left ventricular diastolic parameters are  consistent with Grade II diastolic  dysfunction (pseudonormalization).   2. Right ventricular systolic function is normal. The right ventricular  size is normal.   3. The mitral valve is degenerative. Mild mitral valve regurgitation.   TTE 06/2022: IMPRESSIONS   1. No left ventricular thrombus is seen (Definity  contrast was used).  Wall motion  suggests infarction due to occlusion of the mid-LAD artery or  a large ramus intermedius vessel that reaches the lateral apex. Left  ventricular ejection fraction, by  estimation, is 35 to 40%. The left ventricle has moderately decreased  function. The left ventricle demonstrates regional wall motion  abnormalities (see scoring diagram/findings for description). There is  mild concentric left ventricular hypertrophy. Left   ventricular diastolic parameters are consistent with Grade II diastolic  dysfunction (pseudonormalization). Elevated left atrial pressure.   2. Right ventricular systolic function is normal. The right ventricular  size is normal. There is normal pulmonary artery systolic pressure. The  estimated right ventricular systolic pressure is 34.6 mmHg.   3. Left atrial size was severely dilated.   4. The mitral valve is degenerative. Mild to moderate mitral valve  regurgitation. No evidence of mitral stenosis. The mean mitral valve  gradient is 3.1 mmHg. Moderate mitral annular calcification.   5. Tricuspid valve regurgitation is mild to moderate.   6. The aortic valve is tricuspid. Aortic valve regurgitation is not  visualized. No aortic stenosis is present.   R/L Cardiac Catheterization 06/21/2022: CONCLUSIONS: Moderate three-vessel coronary disease involving the distal nondominant right coronary, the large first diagonal jailed by previous LAD stent, obtuse marginal and PDA disease, and ostial to proximal circumflex. Moderate pulmonary hypertension with mean PA pressure 30 mmHg, likely WHO group 2 with capillary wedge pressure of 26, LVEDP 31 mm, pulmonary vascular resistance 2.3 Wood units.  Left Anterior Descending  Vessel is small. There is mild diffuse disease throughout the vessel.  Prox LAD to Mid LAD lesion is 30% stenosed. The lesion was previously treated .    First Diagonal Branch  Vessel is small in size.  1st Diag lesion is 90% stenosed.    Left Circumflex   Ost Cx to Prox Cx lesion is 70% stenosed.    First Obtuse Marginal Branch  1st Mrg lesion is 70% stenosed.    Third Left Posterolateral Branch  Vessel is large in size.  3rd LPL lesion is 70% stenosed.    Right Coronary  Artery  Vessel is small.  Ost RCA lesion is 55% stenosed.  Mid RCA lesion is 70% stenosed.     Cardiac Catheterization 06/25/2022: Conclusions: Successful shockwave assisted stent of the ostial to proximal circumflex reducing 70% stenosis to 10% with TIMI grade III flow.  4.0 x 15 mm stent postdilated to 4.5 cm in diameter with no apparent complications.   RECOMMENDATIONS: Aspirin  and Plavix  for 6 months and then consider monotherapy with clopidogrel  thereafter.  Laboratory Data: High Sensitivity Troponin:  No results for input(s): TROPONINIHS in the last 720 hours.  Recent Labs  Lab 11/22/24 2313 11/23/24 0131  TRNPT 43* 128*      Chemistry Recent Labs  Lab 11/22/24 2313 11/22/24 2321  NA 135 138  K 4.5 4.3  CL 102 106  CO2 20*  --   GLUCOSE 281* 275*  BUN 26* 28*  CREATININE 1.15* 1.20*  CALCIUM  9.8  --   GFRNONAA 47*  --   ANIONGAP 14  --     Recent Labs  Lab 11/22/24 2313  PROT 7.8  ALBUMIN 4.2  AST 18  ALT 8  ALKPHOS 81  BILITOT 0.4   Lipids No results for input(s): CHOL, TRIG, HDL, LABVLDL, LDLCALC, CHOLHDL in the last 168 hours.  Hematology Recent Labs  Lab 11/22/24 2313 11/22/24 2321  WBC 8.8  --   RBC 4.75  --   HGB 13.1 13.3  HCT 42.3 39.0  MCV 89.1  --   MCH 27.6  --   MCHC 31.0  --   RDW 14.0  --   PLT 324  --    Thyroid  No results for input(s): TSH, FREET4 in the last 168 hours.  BNPNo results for input(s): BNP, PROBNP in the last 168 hours.  DDimer No results for input(s): DDIMER in the last 168 hours.  Radiology/Studies:  CT Angio Chest/Abd/Pel for Dissection W and/or Wo Contrast Result Date: 11/23/2024 EXAM: CTA CHEST, ABDOMEN AND PELVIS WITH AND WITHOUT CONTRAST 11/23/2024 12:57:00  AM TECHNIQUE: CTA of the chest was performed with and without the administration of 100 mL of intravenous iohexol  (OMNIPAQUE ) 350 MG/ML injection. CTA of the abdomen and pelvis was performed with and without the administration of 100 mL of intravenous iohexol  (OMNIPAQUE ) 350 MG/ML injection. Multiplanar reformatted images are provided for review. MIP images are provided for review. Automated exposure control, iterative reconstruction, and/or weight based adjustment of the mA/kV was utilized to reduce the radiation dose to as low as reasonably achievable. COMPARISON: Prior study dated 08/XX/2023. CLINICAL HISTORY: Acute aortic syndrome (AAS). Chest pain. FINDINGS: VASCULATURE: AORTA: Extensive streak artifact related to contrast within the superior vena cava results in artifactual filling defects within the ascending thoracic aorta. No intramural hematoma, dissection, or thoracic aortic aneurysm. Moderate atherosclerotic calcification within the thoracic aorta. No abdominal aortic aneurysm. PULMONARY ARTERIES: Extensive streak artifact related to contrast within the superior vena cava results in artifactual filling defects within the central pulmonary arterial tree. No definite pulmonary embolism. GREAT VESSELS OF AORTIC ARCH: No acute finding. No dissection. No arterial occlusion or significant stenosis. CELIAC TRUNK: No acute finding. No occlusion or significant stenosis. SUPERIOR MESENTERIC ARTERY: No acute finding. No occlusion or significant stenosis. INFERIOR MESENTERIC ARTERY: Greater than 75% stenosis of the inferior mesenteric artery at its origin. This is of questionable clinical significance given the wide patency of the remaining mesenteric vasculature. RENAL ARTERIES: Greater than 75% stenosis of the right renal artery at its origin secondary to calcified atherosclerotic plaque. Less than 50% stenosis  of the left renal artery at its origin. Both renal arteries demonstrate normal vascular morphology. No  aneurysm or dissection. ILIAC ARTERIES: No acute finding. No occlusion or significant stenosis. CHEST: MEDIASTINUM: Extensive multivessel coronary artery calcifications. Mild cardiomegaly. No mediastinal lymphadenopathy. The heart and pericardium demonstrate no acute abnormality. LUNGS AND PLEURA: The lungs are without acute process. No focal consolidation or pulmonary edema. No evidence of pleural effusion or pneumothorax. THORACIC BONES AND SOFT TISSUES: No acute bone or soft tissue abnormality. ABDOMEN AND PELVIS: LIVER: The liver is unremarkable. GALLBLADDER AND BILE DUCTS: Gallbladder is unremarkable. No biliary ductal dilatation. SPLEEN: The spleen is unremarkable. PANCREAS: The pancreas is unremarkable. ADRENAL GLANDS: 10 mm nodule within the left adrenal gland is compatible with a benign adrenal adenoma for which no follow up imaging is recommended. KIDNEYS, URETERS AND BLADDER: No stones in the kidneys or ureters. No hydronephrosis. No perinephric or periureteral stranding. Moderate volume nondependent gas is seen within the bladder lumen which is nonspecific and can be seen with recent catheterization though aggressive infection or interfascicular fistula could results in similar appearance. Notably, there is effacement of the intervening fat plane between the bladder and several diverticula of the sigmoid colon involving the dome (series 263, image 6; series 136, image 10). Correlation with urinalysis and urine culture may be helpful for confirmation. GI AND BOWEL: Small hiatal hernia. There is severe descending and sigmoid colonic diverticulosis without superimposed acute inflammatory change identified. The stomach, small bowel, and large bowel are otherwise unremarkable. Appendix normal. Small fat-containing umbilical hernia. There is no bowel obstruction. No abnormal bowel wall thickening or distension. REPRODUCTIVE: Uterus absent. No adnexal masses. PERITONEUM AND RETROPERITONEUM: No ascites or free  air. LYMPH NODES: No lymphadenopathy. ABDOMINAL BONES AND SOFT TISSUES: Status post left hip ORIF. Moderate-to-severe bilateral degenerative hip arthritis, asymmetrically more severe on the left. Osseous structures are otherwise age appropriate. No acute bone abnormality. No lytic or blastic bone lesion. No acute soft tissue abnormality. IMPRESSION: 1. No evidence of acute aortic syndrome. 2. No definite pulmonary embolism, limited by streak artifact. 3. Extensive multivessel coronary artery calcifications and mild cardiomegaly. 4. Greater than 75% stenosis of the right renal artery origin due to calcified atherosclerotic plaque. Correlation for clinical evidence of hemodynamically significant renal artery stenosis is recommended. 5. Greater than 75% stenosis of the inferior mesenteric artery origin, unlikely of clinical significance given wide patency of the remaining mesenteric vasculature. . 6. Moderate nondependent intraluminal bladder gas with effacement of the fat plane between the bladder dome and adjacent sigmoid diverticula. While this is most commonly seen in the setting of recent bladder catheterization, aggressive infection or potentially choledochal fistula could result in a similar appearance. Correlation with urinalysis and urine culture would be helpful for further evaluation. 7. Severe descending and sigmoid colonic diverticulosis without acute inflammatory change. 8. Small hiatal hernia. 9. 10 mm left adrenal adenoma, with no follow-up imaging recommended. 10. Raf score includes aortic atherosclerosis (ICD10-I70.0). Electronically signed by: Dorethia Molt MD MD 11/23/2024 01:23 AM EST RP Workstation: [id number]   DG Chest Portable 1 View Result Date: 11/22/2024 EXAM: 1 VIEW(S) XRAY OF THE CHEST 11/22/2024 11:30:00 PM COMPARISON: None available. CLINICAL HISTORY: FINDINGS: LINES, TUBES AND DEVICES: EKG leads noted. LUNGS AND PLEURA: No focal pulmonary opacity. No pleural effusion. No  pneumothorax. HEART AND MEDIASTINUM: Mild cardiomegaly. Aortic atherosclerosis. BONES AND SOFT TISSUES: No acute osseous abnormality. IMPRESSION: 1. No acute findings. 2. Mild cardiomegaly and aortic atherosclerosis. Electronically signed by: Dorethia Molt MD MD 11/22/2024 11:34  PM EST RP Workstation: [id number]     Assessment and Plan: NSTEMI Multivessel CAD, prior PCI/DES to LAD (jailed D1) 2014, shockwave assisted PCI/DES to ostial Lcx 2023  HFimpEF 2/2 ICM, LVEF 35-40% -> 60-65% most recently assessed 09/2022 Newly diagnosed atrial fibrillation, rate controlled HTN HLD T2DM  Cardiology consulted on this 61F with known multivessel CAD, prior PCI/DES to both LAD and ostial Lcx. History concerning for ACS and rules in for MI. There are subtle submilimeter ST changes in the inferior leads though not meeting formal STEMI criteria and improved on serial EKG obtained a few hours later. Patient currently chest pain free. Tentative plan for LHC later today. She is also noted to be in atrial fibrillation which is a new diagnosis for her, fortunately rate controlled but likely will require short duration of triple therapy if coronary intervention performed.   Plan - Trop trend 43 - 128 as of the writing of this note. Please trend trop to peak - s/p ASA 324mg  load at home, patient states strictly adherent to PTA plavix  75mg  daily. Continue DAPT with aspirin  81mg  daily, plavix  75mg  daily - Started on ACS heparin  in the ED. Given new diagnosis of atrial fibrillation and elevated CHADS2VASC score of 7, patient will require indefinite anticoagulation (and potentially a short duration of triple therapy if coronary intervention performed)  - Further management of atrial fibrillation following resolution of MI. Continue PTA coreg , along with other cardiac medications - High-intensity statin, patient currently on crestor  10mg  which can be increased to high-intensity dosing 20 vs. 40mg  daily - Please obtain TSH,  A1c, lipid panel here - TTE in AM - NPO, tentative plan for LHC in AM. Serial EKGs through the evening  Risk Assessment/Risk Scores:  TIMI Risk Score for Unstable Angina or Non-ST Elevation MI:   The patient's TIMI risk score is 5, which indicates a 26% risk of all cause mortality, new or recurrent myocardial infarction or need for urgent revascularization in the next 14 days.{  CHA2DS2-VASc Score =   7       For questions or updates, please contact [facility] Please consult [email or web address] for contact info under   Signed, Franky GORMAN Earthly, MD  11/23/2024 2:04 AM  ------------------------------------------------------------------------------------------------   Slater CHRISTELLA Car was seen by me today along with Franky Earthly, MD. I have personally performed an evaluation on this patient.  My findings are as follows: 84 y.o. female w/hypertension, hyperlipidemia, type 2 DM, CAD w/prior PCIs, ischemic cardiomyopathy with improved LVEF, admitted with chest pain, NSTEMI.  Patient lives with her husband, activity limited by hip and knee pain, needs help from husband for ADLs. Patient had sharp retrosternal pain 11/22/2024 around 9 PM while watching TV, did not resolve with multiple SL NTG, lasted for several hours, currently comfortable without any significant chest pain. At baseline, does not have chest pain or dyspnea with limited activity, does reported stable leg pain. She has had recent UTI.  Blood pressure elevated. Reportedly compliant at home, but A1C is 9.6%. Last seen by Dr. Delford in 07/2024.  Data: EKG(s) and pertinent labs, studies, etc were personally reviewed and interpreted by me:  EKG 11/23/2024: Afib 66 bpm Old anterior and inferior infarct  Labs Trop HS 43->128->308 Cr 1.08, Hb 11.2 Otherwise, I agree with data as outlined by the advanced practice provider.  CTA C/A/P 11/2023: 1. No evidence of acute aortic syndrome. 2. No definite pulmonary embolism, limited by  streak artifact. 3. Extensive multivessel coronary artery calcifications and mild  cardiomegaly. 4. Greater than 75% stenosis of the right renal artery origin due to calcified atherosclerotic plaque. Correlation for clinical evidence of hemodynamically significant renal artery stenosis is recommended. 5. Greater than 75% stenosis of the inferior mesenteric artery origin, unlikely of clinical significance given wide patency of the remaining mesenteric vasculature. . 6. Moderate nondependent intraluminal bladder gas with effacement of the fat plane between the bladder dome and adjacent sigmoid diverticula. While this is most commonly seen in the setting of recent bladder catheterization, aggressive infection or potentially choledochal fistula could result in a similar appearance. Correlation with urinalysis and urine culture would be helpful for further evaluation. 7. Severe descending and sigmoid colonic diverticulosis without acute inflammatory change. 8. Small hiatal hernia. 9. 10 mm left adrenal adenoma, with no follow-up imaging recommended. 10. Raf score includes aortic atherosclerosis (ICD10-I70.0).  Echocardiogram 09/2022:  1. Left ventricular ejection fraction, by estimation, is 60 to 65%. The  left ventricle has normal function. There is moderate concentric left  ventricular hypertrophy. Left ventricular diastolic parameters are  consistent with Grade II diastolic  dysfunction (pseudonormalization).   2. Right ventricular systolic function is normal. The right ventricular  size is normal.   3. The mitral valve is degenerative. Mild mitral valve regurgitation.   Comparison(s): No significant change from prior study.   Coronary angiography and intervention 06/2022: Successful shockwave assisted stent of the ostial to proximal circumflex reducing 70% stenosis to 10% with TIMI grade III flow.  4.0 x 15 mm stent postdilated to 4.5 cm in diameter with no apparent complications.     Moderate three-vessel coronary disease involving the distal nondominant right coronary, the large first diagonal jailed by previous LAD stent, obtuse marginal and PDA disease, and ostial to proximal circumflex. Moderate pulmonary hypertension with mean PA pressure 30 mmHg, likely WHO group 2 with capillary wedge pressure of 26, LVEDP 31 mm, pulmonary vascular resistance 2.3 Wood units.    Exam performed by me: No acute distress Normal heart sounds, no murmur No rales No JVD 1+ b/l pitting leg edema   My Assessment and Plan: 84 y.o. female w/hypertension, hyperlipidemia, type 2 DM, CAD w/prior PCIs, ischemic cardiomyopathy with improved LVEF, admitted with chest pain, NSTEMI, new diagnosis Afib, Rt renal artery stenosis  NSTEMI: Chest pain on presentation on 11/22/2024 9 PM, currently chest pain-free. High sensitive troponin 300s.  PE or acute aortic syndrome ruled out on CTA. Blood pressure elevated, which could be possibly contributing, see below regarding hypertension management. No dyspnea symptoms, but does have bilateral 1+ pitting edema.  Needs echocardiogram. Continue aspirin , heparin . Resume home medication carvedilol , hold losartan  and spironolactone , as well as Imdur , resume Lasix . Currently on Crestor  10 mg daily, check lipid panel. Will plan on performing heart catheterization later today with possible intervention.  Atrial fibrillation: New finding, rate controlled. Will need outpatient management. After cath, will need initiation of DOAC, Eliquis  5 mg bid. Patient was on Aspirin , Plavix  at home. If she has PCI, will likely need triple therapy with Aspirin , Plavix , and Eliquis  for potentially up to 1 month, then stop Aspirin .   Hypertension: Uncontrolled, on Coreg  6.25 mg twice daily, losartan  12.5 mg daily, spironolactone  12.5 mg daily, Imdur  30 mg daily. 75% right renal artery stenosis, could also be contributing. Uptitrate medical management during this  hospitalization.  If blood pressure remains uncontrolled, could consider outpatient workup and management for renal artery stenosis.  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 Bladder  abnormalities could related to recent UTI, defer management to primary team.  Type 2 diabetes mellitus: Uncontrolled, A1c 9.7%. Goal A1c <7%.  Defer management to primary team.    Signed,  Newman JINNY Lawrence, MD  11/23/2024 8:39 AM       [1]  Allergies Allergen Reactions   Penicillins Anaphylaxis    Diffuse joint pain @ age 61 in context of Scarlet Fever  Did it involve swelling of the face/tongue/throat, SOB, or low BP? No  Did it involve sudden or severe rash/hives, skin peeling, or any reaction on the inside of your mouth or nose? No  Did you need to seek medical attention at a hospital or doctor's office? No  When did it last happen?  childhood      If all above answers are NO, may proceed with cephalosporin use.   "

## 2024-11-23 NOTE — Plan of Care (Signed)
" °  Problem: Education: Goal: Ability to describe self-care measures that may prevent or decrease complications (Diabetes Survival Skills Education) will improve Outcome: Progressing   Problem: Education: Goal: Ability to describe self-care measures that may prevent or decrease complications (Diabetes Survival Skills Education) will improve Outcome: Progressing Goal: Individualized Educational Video(s) Outcome: Progressing   Problem: Coping: Goal: Ability to adjust to condition or change in health will improve Outcome: Progressing   Problem: Health Behavior/Discharge Planning: Goal: Ability to identify and utilize available resources and services will improve Outcome: Progressing   Problem: Nutritional: Goal: Maintenance of adequate nutrition will improve Outcome: Progressing   "

## 2024-11-23 NOTE — ED Notes (Signed)
 MD at [facility]

## 2024-11-23 NOTE — Progress Notes (Signed)
 Site area: Right groin  Site Prior to Removal:  Level 0 Pressure Applied For:25 minutes Manual:  Yes  Patient Status During Pull:  Stable Post Pull Site:  Level 0 Post Pull Instructions Given:  Yes Post Pull Pulses Present: Right DP  +2 palpable Dressing Applied: gauze and tegaderm  Bedrest begins @1625   Comments: sheath removed and mual pressure held by Dot, RN cath lab staff

## 2024-11-23 NOTE — Plan of Care (Signed)

## 2024-11-23 NOTE — H&P (Signed)
 " History and Physical      Danielle Murray FMW:[id number] DOB: 1941/06/17 DOA: 11/22/2024; DOS: 11/23/2024  PCP: Clarice Nottingham, MD  Patient coming from: home   I have personally briefly reviewed patient's old medical records in [facility]  Chief Complaint: Chest pain  HPI: Danielle Murray is a 84 y.o. female with medical history significant for coronary artery disease status post PCI with drug-eluting stent to the LAD in October 2014 followed by PCI with stent to the circumflex in August 2023, essential pretension, hyperlipidemia, acquired hypothyroidism, type 2 diabetes mellitus, who is admitted to [facility] on 11/22/2024 with NSTEMI after presenting from home to [facility] ED complaining of chest pain.   Patient reports acute onset of substernal chest discomfort around 2100 on 11/22/2024, described as sharp in nature, with radiation to the back.  She notes that this discomfort was nonexertional and started at rest.  Denies any associated shortness of breath, palpitations, nausea, vomiting, dizziness, but conveys associated diaphoresis.  She took a sublingual nitroglycerin  x 1 at home, without any significant improvement in her chest pain, prompting her to to call EMS, who subsidy brought the patient to [facility] emergency department for further evaluation management of her chest pain.  And route to the ED, EMS administered 3 additional sublingual nitroglycerin , with the patient reporting that her chest pain improved in terms of intensity following 3 nitroglycerin , but did not resolve completely.  She notes that following fentanyl  50 mcg IV x 2 doses in the ED, that her chest pain has now completely resolved, and she confirms to me that she remains chest pain-free at this time.  She has known history of coronary artery disease status post PCI with drug-eluting stent placement to the LAD in October 2014 followed by PCI with stent placement to the circumflex in August 2023.  Her most recent  echocardiogram occurred in November 2023.  She has no known history of underlying atrial fibrillation.  She is reported to have experienced a urinary tract infection in December 2025.  Denies any recent abdominal discomfort.  Received full dose aspirin  x 1 via EMS and route to the ED this evening.   ED Course:  Vital signs in the ED were notable for the following: Afebrile; rates in the 60s to 80s, noted to be in new onset atrial fibrillation; still blood pressures in the 160s; respiratory rate 18-22, oxygen saturation 97 to 1% on room air.  Labs were notable for the following: CMP notable for the following: Sodium 135, potassium 4.5, bicarbonate 20, anion gap 14, creatinine 1.15, glucose 281, liver enzymes within normal limits.  Lipase 23.  High-sensitivity troponin T was 43, the repeat value trending up to 128.  CBC notable for white cell count 8800, hemoglobin 13.1.  Per my interpretation, EKG in ED demonstrated the following: Atrial fibrillation with single PVC, rate 72 and no evidence of T wave or ST changes, including no evidence of ST elevation.  Imaging in the ED, per corresponding formal radiology read, was notable for the following: 1 view chest x-ray showed no evidence of acute cardiopulmonary process.  CTA chest, abdomen, pelvis with dissection protocol showed no evidence of acute aortic syndrome nor any evidence of acute pulmonary embolism or any evidence of pulmonary infiltrate, pulmonary edema, pleural effusion, or pneumothorax.  This imaging showed evidence of intraluminal bladder gas, as well as descending/sigmoid colonic diverticulosis in the absence of diverticulitis.  No evidence of bowel abscess, perforation, or obstruction.  EDP has  discussed with on-call cardiology, Dr. Michiel, who will formally consult, and conveys anticipation of cardiology taking taking for LHC this AM, requesting that the patient be kept n.p.o. for now.  While in the ED, the following were administered:  Nitroglycerin  paste, fentanyl  50 mg IV x 2 doses, heparin  bolus followed by initiation of heparin  drip.  Subsequently, the patient was admitted for further evaluation management of NSTEMI in the setting of presenting chest pain, with finding of new onset atrial fibrillation, as well as CT imaging suggestive of pneumaturia.    Review of Systems: As per HPI otherwise 10 point review of systems negative.   Past Medical History:  Diagnosis Date   Arthritis    fingers, all my joints (09/03/2013)   Coronary artery disease    a. 08/2013: unstable angina s/p PTCA/DES to LAD, medical therapy for residual severe stenosis of a mid-distal left PDA branch of large dominant LCx, moderate RCA disease (consider PCI of L-PDA if she fails med rx).   DEPRESSION    Diverticulosis    Hyperlipidemia    Hypertension    HYPOTHYROIDISM    Preseptal cellulitis of right upper eyelid 03/10/2016   SVT (supraventricular tachycardia)    a. very brief transient SVT during 08/2013 admission overnight.   Type II diabetes mellitus (HCC)    Dr Tommas    Past Surgical History:  Procedure Laterality Date   ABDOMINAL HYSTERECTOMY  1989   no BSO; dysfunctional menses   CATARACT EXTRACTION W/ INTRAOCULAR LENS  IMPLANT, BILATERAL Bilateral 2014   CATARACT EXTRACTION W/PHACO Right 07/02/2013   CATARACT EXTRACTION W/PHACO Left 06/01/2013   COLONOSCOPY  2003   Tics   CORONARY ANGIOPLASTY WITH STENT PLACEMENT  09/03/2013   1 (09/03/2013)   CORONARY LITHOTRIPSY N/A 06/25/2022   Procedure: CORONARY LITHOTRIPSY;  Surgeon: Claudene Victory ORN, MD;  Location: [facility] INVASIVE CV LAB;  Service: Cardiovascular;  Laterality: N/A;   CORONARY STENT INTERVENTION N/A 06/25/2022   Procedure: CORONARY STENT INTERVENTION;  Surgeon: Claudene Victory ORN, MD;  Location: [facility] INVASIVE CV LAB;  Service: Cardiovascular;  Laterality: N/A;   CORONARY ULTRASOUND/IVUS N/A 06/25/2022   Procedure: Intravascular Ultrasound/IVUS;  Surgeon: Claudene Victory ORN, MD;   Location: [facility] INVASIVE CV LAB;  Service: Cardiovascular;  Laterality: N/A;   FEMUR IM NAIL Left 06/02/2022   Procedure: INTRAMEDULLARY (IM) NAIL FEMORAL;  Surgeon: Edna Toribio LABOR, MD;  Location: [facility] OR;  Service: Orthopedics;  Laterality: Left;   LEFT HEART CATHETERIZATION WITH CORONARY ANGIOGRAM N/A 09/03/2013   Procedure: LEFT HEART CATHETERIZATION WITH CORONARY ANGIOGRAM;  Surgeon: Ozell JONETTA Fell, MD;  Location: [facility] CATH LAB;  Service: Cardiovascular;  Laterality: N/A;   RIGHT/LEFT HEART CATH AND CORONARY ANGIOGRAPHY N/A 06/21/2022   Procedure: RIGHT/LEFT HEART CATH AND CORONARY ANGIOGRAPHY;  Surgeon: Claudene Victory ORN, MD;  Location: [facility] INVASIVE CV LAB;  Service: Cardiovascular;  Laterality: N/A;   TOTAL KNEE ARTHROPLASTY Left 06/2005   Dr Heide    Social History:  reports that she has never smoked. She has never used smokeless tobacco. She reports current alcohol use. She reports that she does not use drugs.   Allergies[1]  Family History  Problem Relation Age of Onset   Asthma Mother    Hypertension Mother    Heart failure Mother    Hypertension Father    Stroke Father 56   Heart attack Father 55   Hypertension Sister    Hyperlipidemia Sister    Heart attack Sister        pre 42;  Twin   Diabetes Sister        her TWIN   Hypertension Brother    Hyperlipidemia Brother    Coronary artery disease Brother        stents late 29s   Breast cancer Neg Hx     Family history reviewed and not pertinent    Prior to Admission medications  Medication Sig Start Date End Date Taking? Authorizing Provider  levothyroxine  (SYNTHROID ) 75 MCG tablet Take 75 mcg by mouth every morning. 10/30/24  Yes [provider]  rosuvastatin  (CRESTOR ) 10 MG tablet Take 10 mg by mouth daily. 11/03/24  Yes [provider]  acetaminophen  (TYLENOL ) 325 MG tablet Take 2 tablets (650 mg total) by mouth every 4 (four) hours as needed for headache or mild pain. 06/28/22   Fairy Frames, MD   aspirin  81 MG chewable tablet Chew 1 tablet (81 mg total) by mouth daily. 06/28/22   Fairy Frames, MD  calcium  citrate (CALCITRATE - DOSED IN MG ELEMENTAL CALCIUM ) 950 (200 Ca) MG tablet Take 200 mg of elemental calcium  by mouth daily.    [provider]  carvedilol  (COREG ) 6.25 MG tablet TAKE 1 TABLET BY MOUTH TWICE DAILY WITH A MEAL 08/03/24   Nishan, Peter C, MD  Cholecalciferol (VITAMIN D3 PO) Take 1 tablet by mouth daily.    [provider]  clopidogrel  (PLAVIX ) 75 MG tablet Take 1 tablet (75 mg total) by mouth daily. 11/03/24   Nishan, Peter C, MD  furosemide  (LASIX ) 40 MG tablet Take 1 tablet (40 mg total) by mouth daily. 08/09/22   Nishan, Peter C, MD  isosorbide  mononitrate (IMDUR ) 30 MG 24 hr tablet Take 1 tablet (30 mg total) by mouth daily. 08/25/24   Delford Maude BROCKS, MD  levothyroxine  (SYNTHROID , LEVOTHROID) 88 MCG tablet Take 88 mcg by mouth at bedtime.     [provider]  losartan  (COZAAR ) 25 MG tablet Take 0.5 tablets (12.5 mg total) by mouth daily. 09/07/24   Nishan, Peter C, MD  Menthol, Topical Analgesic, (BIOFREEZE ROLL-ON) 4 % GEL Apply topically as needed. 07/05/22   [provider]  nitroGLYCERIN  (NITROSTAT ) 0.4 MG SL tablet Place 1 tablet (0.4 mg total) under the tongue every 5 (five) minutes as needed for chest pain (3 doses max). 08/04/18   Nishan, Peter C, MD  NOVOLIN  70/30 KWIKPEN (70-30) 100 UNIT/ML KwikPen Inject 20 Units into the skin in the morning and at bedtime. Take 30 units in the morning and Take 50 units at bedtime Patient taking differently: Inject 20 Units into the skin daily at 6 (six) AM. Take 30 units in the morning 06/28/22   Fairy Frames, MD  rosuvastatin  (CRESTOR ) 5 MG tablet Take 1 tablet (5 mg total) by mouth daily. 10/14/20   Delford Maude BROCKS, MD  spironolactone  (ALDACTONE ) 25 MG tablet Take 1/2 (one-half) tablet by mouth once daily 07/14/24   Delford Maude BROCKS, MD     Objective    Physical Exam: Vitals:    11/22/24 2253 11/22/24 2257 11/23/24 0130 11/23/24 0145  BP:  (!) 194/53 (!) 165/89 (!) 164/71  Pulse:  71 77 64  Resp:  18 (!) 22 19  Temp:  98.7 F (37.1 C)    TempSrc:  Oral    SpO2:  100% 100% 97%  Weight: 81.6 kg     Height: 5' 4 (1.626 m)       General: appears to be stated age; alert, oriented Skin: warm, dry, no rash Head:  AT/[place]  Mouth:  Oral mucosa membranes appear moist, normal dentition Neck: supple; trachea midline Heart:  RRR; did not appreciate any M/R/G Lungs: CTAB, did not appreciate any wheezes, rales, or rhonchi Abdomen: + BS; soft, ND, NT Extremities: no peripheral edema, no muscle wasting         Labs on Admission: I have personally reviewed following labs and imaging studies  CBC: Recent Labs  Lab 11/22/24 2313 11/22/24 2321  WBC 8.8  --   NEUTROABS 6.4  --   HGB 13.1 13.3  HCT 42.3 39.0  MCV 89.1  --   PLT 324  --    Basic Metabolic Panel: Recent Labs  Lab 11/22/24 2313 11/22/24 2321  NA 135 138  K 4.5 4.3  CL 102 106  CO2 20*  --   GLUCOSE 281* 275*  BUN 26* 28*  CREATININE 1.15* 1.20*  CALCIUM  9.8  --    GFR: Estimated Creatinine Clearance: 36.7 mL/min (A) (by C-G formula based on SCr of 1.2 mg/dL (H)). Liver Function Tests: Recent Labs  Lab 11/22/24 2313  AST 18  ALT 8  ALKPHOS 81  BILITOT 0.4  PROT 7.8  ALBUMIN 4.2   Recent Labs  Lab 11/22/24 2313  LIPASE 23   No results for input(s): AMMONIA in the last 168 hours. Coagulation Profile: No results for input(s): INR, PROTIME in the last 168 hours. Cardiac Enzymes: No results for input(s): CKTOTAL, CKMB, CKMBINDEX, TROPONINI in the last 168 hours. BNP (last 3 results) No results for input(s): PROBNP in the last 8760 hours. HbA1C: No results for input(s): HGBA1C in the last 72 hours. CBG: No results for input(s): GLUCAP in the last 168 hours. Lipid Profile: No results for input(s): CHOL, HDL, LDLCALC, TRIG, CHOLHDL, LDLDIRECT in  the last 72 hours. Thyroid  Function Tests: No results for input(s): TSH, T4TOTAL, FREET4, T3FREE, THYROIDAB in the last 72 hours. Anemia Panel: No results for input(s): VITAMINB12, FOLATE, FERRITIN, TIBC, IRON , RETICCTPCT in the last 72 hours. Urine analysis:    Component Value Date/Time   COLORURINE YELLOW 06/19/2022 0454   APPEARANCEUR CLOUDY (A) 06/19/2022 0454   LABSPEC 1.017 06/19/2022 0454   PHURINE 5.0 06/19/2022 0454   GLUCOSEU >=500 (A) 06/19/2022 0454   HGBUR NEGATIVE 06/19/2022 0454   HGBUR large 10/10/2010 1014   BILIRUBINUR NEGATIVE 06/19/2022 0454   KETONESUR 5 (A) 06/19/2022 0454   PROTEINUR 30 (A) 06/19/2022 0454   UROBILINOGEN 0.2 10/10/2010 1014   NITRITE NEGATIVE 06/19/2022 0454   LEUKOCYTESUR SMALL (A) 06/19/2022 0454    Radiological Exams on Admission: CT Angio Chest/Abd/Pel for Dissection W and/or Wo Contrast Result Date: 11/23/2024 EXAM: CTA CHEST, ABDOMEN AND PELVIS WITH AND WITHOUT CONTRAST 11/23/2024 12:57:00 AM TECHNIQUE: CTA of the chest was performed with and without the administration of 100 mL of intravenous iohexol  (OMNIPAQUE ) 350 MG/ML injection. CTA of the abdomen and pelvis was performed with and without the administration of 100 mL of intravenous iohexol  (OMNIPAQUE ) 350 MG/ML injection. Multiplanar reformatted images are provided for review. MIP images are provided for review. Automated exposure control, iterative reconstruction, and/or weight based adjustment of the mA/kV was utilized to reduce the radiation dose to as low as reasonably achievable. COMPARISON: Prior study dated 08/XX/2023. CLINICAL HISTORY: Acute aortic syndrome (AAS). Chest pain. FINDINGS: VASCULATURE: AORTA: Extensive streak artifact related to contrast within the superior vena cava results in artifactual filling defects within the ascending thoracic aorta. No intramural hematoma, dissection, or thoracic aortic aneurysm. Moderate atherosclerotic calcification  within the thoracic aorta. No  abdominal aortic aneurysm. PULMONARY ARTERIES: Extensive streak artifact related to contrast within the superior vena cava results in artifactual filling defects within the central pulmonary arterial tree. No definite pulmonary embolism. GREAT VESSELS OF AORTIC ARCH: No acute finding. No dissection. No arterial occlusion or significant stenosis. CELIAC TRUNK: No acute finding. No occlusion or significant stenosis. SUPERIOR MESENTERIC ARTERY: No acute finding. No occlusion or significant stenosis. INFERIOR MESENTERIC ARTERY: Greater than 75% stenosis of the inferior mesenteric artery at its origin. This is of questionable clinical significance given the wide patency of the remaining mesenteric vasculature. RENAL ARTERIES: Greater than 75% stenosis of the right renal artery at its origin secondary to calcified atherosclerotic plaque. Less than 50% stenosis of the left renal artery at its origin. Both renal arteries demonstrate normal vascular morphology. No aneurysm or dissection. ILIAC ARTERIES: No acute finding. No occlusion or significant stenosis. CHEST: MEDIASTINUM: Extensive multivessel coronary artery calcifications. Mild cardiomegaly. No mediastinal lymphadenopathy. The heart and pericardium demonstrate no acute abnormality. LUNGS AND PLEURA: The lungs are without acute process. No focal consolidation or pulmonary edema. No evidence of pleural effusion or pneumothorax. THORACIC BONES AND SOFT TISSUES: No acute bone or soft tissue abnormality. ABDOMEN AND PELVIS: LIVER: The liver is unremarkable. GALLBLADDER AND BILE DUCTS: Gallbladder is unremarkable. No biliary ductal dilatation. SPLEEN: The spleen is unremarkable. PANCREAS: The pancreas is unremarkable. ADRENAL GLANDS: 10 mm nodule within the left adrenal gland is compatible with a benign adrenal adenoma for which no follow up imaging is recommended. KIDNEYS, URETERS AND BLADDER: No stones in the kidneys or ureters. No  hydronephrosis. No perinephric or periureteral stranding. Moderate volume nondependent gas is seen within the bladder lumen which is nonspecific and can be seen with recent catheterization though aggressive infection or interfascicular fistula could results in similar appearance. Notably, there is effacement of the intervening fat plane between the bladder and several diverticula of the sigmoid colon involving the dome (series 263, image 6; series 136, image 10). Correlation with urinalysis and urine culture may be helpful for confirmation. GI AND BOWEL: Small hiatal hernia. There is severe descending and sigmoid colonic diverticulosis without superimposed acute inflammatory change identified. The stomach, small bowel, and large bowel are otherwise unremarkable. Appendix normal. Small fat-containing umbilical hernia. There is no bowel obstruction. No abnormal bowel wall thickening or distension. REPRODUCTIVE: Uterus absent. No adnexal masses. PERITONEUM AND RETROPERITONEUM: No ascites or free air. LYMPH NODES: No lymphadenopathy. ABDOMINAL BONES AND SOFT TISSUES: Status post left hip ORIF. Moderate-to-severe bilateral degenerative hip arthritis, asymmetrically more severe on the left. Osseous structures are otherwise age appropriate. No acute bone abnormality. No lytic or blastic bone lesion. No acute soft tissue abnormality. IMPRESSION: 1. No evidence of acute aortic syndrome. 2. No definite pulmonary embolism, limited by streak artifact. 3. Extensive multivessel coronary artery calcifications and mild cardiomegaly. 4. Greater than 75% stenosis of the right renal artery origin due to calcified atherosclerotic plaque. Correlation for clinical evidence of hemodynamically significant renal artery stenosis is recommended. 5. Greater than 75% stenosis of the inferior mesenteric artery origin, unlikely of clinical significance given wide patency of the remaining mesenteric vasculature. . 6. Moderate nondependent  intraluminal bladder gas with effacement of the fat plane between the bladder dome and adjacent sigmoid diverticula. While this is most commonly seen in the setting of recent bladder catheterization, aggressive infection or potentially choledochal fistula could result in a similar appearance. Correlation with urinalysis and urine culture would be helpful for further evaluation. 7. Severe descending and sigmoid colonic diverticulosis  without acute inflammatory change. 8. Small hiatal hernia. 9. 10 mm left adrenal adenoma, with no follow-up imaging recommended. 10. Raf score includes aortic atherosclerosis (ICD10-I70.0). Electronically signed by: Dorethia Molt MD MD 11/23/2024 01:23 AM EST RP Workstation: [id number]   DG Chest Portable 1 View Result Date: 11/22/2024 EXAM: 1 VIEW(S) XRAY OF THE CHEST 11/22/2024 11:30:00 PM COMPARISON: None available. CLINICAL HISTORY: FINDINGS: LINES, TUBES AND DEVICES: EKG leads noted. LUNGS AND PLEURA: No focal pulmonary opacity. No pleural effusion. No pneumothorax. HEART AND MEDIASTINUM: Mild cardiomegaly. Aortic atherosclerosis. BONES AND SOFT TISSUES: No acute osseous abnormality. IMPRESSION: 1. No acute findings. 2. Mild cardiomegaly and aortic atherosclerosis. Electronically signed by: Dorethia Molt MD MD 11/22/2024 11:34 PM EST RP Workstation: [id number]      Assessment/Plan   Principal Problem:   NSTEMI (non-ST elevated myocardial infarction) (HCC) Active Problems:   Acquired hypothyroidism   Essential hypertension   DM2 (diabetes mellitus, type 2) (HCC)   HLD (hyperlipidemia)   Chest pain   Elevated troponin   Pneumaturia   Paroxysmal atrial fibrillation (HCC)       #) NSTEMI: Diagnosis on the basis of new onset substernal chest discomfort, which improved, but did not resolve following nitroglycerin , as above, with mildly elevated troponin, as further quantified above.  EKG shows new onset atrial fibrillation, but without any evidence of T wave or  ST changes, including no evidence of STEMI.  Given radiation to the back, CTA chest, abdomen, pelvis with dissection protocol was pursued and showed no evidence of acute aortic syndrome nor any evidence of pulmonary embolism nor any evidence of pneumothorax.  Chest pain is now resolved following fentanyl  50 mg IV x 2 doses.  She has a known history of CAD, with multiple prior drug-eluting stents, as further detailed above, and multiple CAD risk factors for interval worsening of atherosclerotic disease.  EDP has discussed with on-call cardiology, Dr. Michiel, who will formally consult, and conveys anticipation of cardiology taking taking for LHC this AM, requesting that the patient be kept n.p.o. for now.  Status post full dose aspirin  x 1 administered via EMS and route to the ED this evening.  Plan: Cardiology to formally consult, as above.  Continue heparin  drip.  N.p.o. per cardiology.  Monitor on symmetry.  Repeat CMP, CBC, magnesium level in the morning.  Prn IV fentanyl  for chest pain.  Prn EKG for additional chest pain.  Continue to trend troponin.  Echocardiogram ordered for the morning.                  # )new onset atrial fibrillation: New diagnosis of atrial fibrillation in the ED this evening, rate controlled, with heart rates in the 60s to 80s, sustaining in the high 60s to 70s, with the patient currently asymptomatic.  EKG shows no evidence of acute ischemic changes, as further detailed above.  CHA2DS2-VASc score is greater than 2, deferring indication for chronic anticoagulation.  For now, we will continue with heparin  drip, initiated for presenting NSTEMI.  Plan: Monitor on telemetry.  Echocardiogram in the morning.  Trend troponin.  CMP, CBC, magnesium levels in the morning.  Cardiology consulting.  Heparin  drip, as above.                      # (Pneumaturia: Identified incidentally on CTA chest, will, pelvis performed this evening.  No report of a recent  urinary catheterization.  She is reported to have a history of UTI and December 2025.  Differential  also includes the possibility of colovesicular fistula given proximity of the bladder to extensive descending/sigmoid colonic diverticulosis without evidence of diverticulitis.  Will start with assessment for underlying urinary tract infection via urinalysis.  Denies any abdominal discomfort and no evidence of acute surgical abdomen on exam.  Plan: Check urinalysis, as above.                     #) Essential Hypertension: documented h/o such, with outpatient antihypertensive regimen including Imdur , Coreg , Lasix , losartan .  SBP's in the ED today: 160s mmHg.   Plan: Close monitoring of subsequent BP via routine VS. hold home antihypertensive medications for now in the setting of current n.p.o. status.  Monitor on telemetry.  Monitor strict I's and O's and daily weights.                         #) Hyperlipidemia: documented h/o such. On rosuvastatin  as outpatient.   Plan: In the setting of current n.p.o. status, will hold home statin for now.                        #) acquired hypothyroidism: documented h/o such, on Synthroid  as outpatient.   Plan: Hold home Synthroid  for now in setting of current n.p.o. status.  Add on TSH level.                          #) Type 2 Diabetes Mellitus: documented history of such. Home insulin  regimen: 70/30 insulin  with 30 units subcu every morning as well as 50 of subcu nightly. Home oral hypoglycemic agents: None. presenting blood sugar: 281, without evidence of elevated anion gap to suggest DKA. Most recent A1c noted to be 7.5% when checked in August 2023.  Plan: In the setting of current n.p.o. status, will pursue Accu-Cheks every 6 hours with low-dose sliding scale insulin .  Will start with basal insulin  in the form of Lantus  10 units SQ twice daily.  Add on hemoglobin A1c  level.           DVT prophylaxis: Heparin  drip Code Status: Full code Family Communication: none Disposition Plan: Per Rounding Team Consults called: EDP has discussed with on-call cardiology, Dr. Michiel, who will formally consult, as further detailed above.;  Admission status: Inpatient     I SPENT GREATER THAN 75  MINUTES IN CLINICAL CARE TIME/MEDICAL DECISION-MAKING IN COMPLETING THIS ADMISSION.      Eva NOVAK Karlee Staff DO [facility]  From 7PM - 7AM   11/23/2024, 2:41 AM         [1]  Allergies Allergen Reactions   Penicillins Anaphylaxis    Diffuse joint pain @ age 73 in context of Scarlet Fever  Did it involve swelling of the face/tongue/throat, SOB, or low BP? No  Did it involve sudden or severe rash/hives, skin peeling, or any reaction on the inside of your mouth or nose? No  Did you need to seek medical attention at a hospital or doctor's office? No  When did it last happen?  childhood      If all above answers are NO, may proceed with cephalosporin use.   "

## 2024-11-23 NOTE — Progress Notes (Signed)
" °  Echocardiogram 2D Echocardiogram has been performed.  Tinnie FORBES Gosling RDCS 11/23/2024, 11:21 AM "

## 2024-11-23 NOTE — Progress Notes (Addendum)
 PHARMACY - ANTICOAGULATION CONSULT NOTE  Pharmacy Consult for Heparin   Indication: atrial fibrillation  Allergies[1]  Patient Measurements: Height: 5' 4 (162.6 cm) Weight: 81.6 kg (180 lb) IBW/kg (Calculated) : 54.7 HEPARIN  DW (KG): 72.4  Vital Signs: Temp: 98.2 F (36.8 C) (01/12 1358) Temp Source: Oral (01/12 1358) BP: 167/64 (01/12 1541) Pulse Rate: 76 (01/12 1541)  Labs: Recent Labs    11/22/24 2313 11/22/24 2321 11/23/24 0354 11/23/24 1210  HGB 13.1 13.3 11.2*  --   HCT 42.3 39.0 34.5*  --   PLT 324  --  275  --   HEPARINUNFRC  --   --   --  0.17*  CREATININE 1.15* 1.20* 1.08*  --     Estimated Creatinine Clearance: 40.8 mL/min (A) (by C-G formula based on SCr of 1.08 mg/dL (H)).   Assessment: 84 y/o F presents to the ED with chest pain, found to be in afib, also with mildly elevated troponin. Pharmacy consulted for heparin  gtt.   S/p cath 1/12 afternoon which showed 2v CAD. To resume heparin  8h post sheath removal. Sheath removed at ~1625.  Goal of Therapy:  Heparin  level 0.3-0.7 units/ml Monitor platelets by anticoagulation protocol: Yes   Plan:  At 0000, start heparin  gtt at 1050 units/hr Heparin  level 8 hours post gtt start Likely to transition to DOAC tomorrow if stable  Vito Ralph, PharmD, BCPS Please see amion for complete clinical pharmacist phone list 11/23/2024 4:00 PM       [1]  Allergies Allergen Reactions   Penicillins Anaphylaxis    Diffuse joint pain @ age 53 in context of Scarlet Fever  Did it involve swelling of the face/tongue/throat, SOB, or low BP? No  Did it involve sudden or severe rash/hives, skin peeling, or any reaction on the inside of your mouth or nose? No  Did you need to seek medical attention at a hospital or doctor's office? No  When did it last happen?  childhood      If all above answers are NO, may proceed with cephalosporin use.

## 2024-11-23 NOTE — Progress Notes (Signed)
 PHARMACY - ANTICOAGULATION CONSULT NOTE  Pharmacy Consult for Heparin   Indication: atrial fibrillation, rule out ACS  Allergies[1]  Patient Measurements: Height: 5' 4 (162.6 cm) Weight: 81.6 kg (180 lb) IBW/kg (Calculated) : 54.7 HEPARIN  DW (KG): 72.4  Vital Signs: Temp: 98.1 F (36.7 C) (01/12 0605) Temp Source: Oral (01/12 0605) BP: 165/83 (01/12 0545) Pulse Rate: 87 (01/12 0545)  Labs: Recent Labs    11/22/24 2313 11/22/24 2321 11/23/24 0354  HGB 13.1 13.3 11.2*  HCT 42.3 39.0 34.5*  PLT 324  --  275  CREATININE 1.15* 1.20* 1.08*    Estimated Creatinine Clearance: 40.8 mL/min (A) (by C-G formula based on SCr of 1.08 mg/dL (H)).   Medical History: Past Medical History:  Diagnosis Date   Arthritis    fingers, all my joints (09/03/2013)   Coronary artery disease    a. 08/2013: unstable angina s/p PTCA/DES to LAD, medical therapy for residual severe stenosis of a mid-distal left PDA branch of large dominant LCx, moderate RCA disease (consider PCI of L-PDA if she fails med rx).   DEPRESSION    Diverticulosis    Hyperlipidemia    Hypertension    HYPOTHYROIDISM    Preseptal cellulitis of right upper eyelid 03/10/2016   SVT (supraventricular tachycardia)    a. very brief transient SVT during 08/2013 admission overnight.   Type II diabetes mellitus (HCC)    Dr Tommas    Assessment: 84 y/o F presents to the ED with chest pain, found to be in afib, also with mildly elevated troponin. Starting heparin . Above labs reviewed. PTA meds reviewed.   Goal of Therapy:  Heparin  level 0.3-0.7 units/ml Monitor platelets by anticoagulation protocol: Yes   Plan:  Heparin  3000 units BOLUS Start heparin  drip at 850 units/hr Heparin  level in 8 hours Daily CBC/Heparin  level Monitor for bleeding  Lynwood Mckusick, PharmD, BCPS Clinical Pharmacist Phone: [phone]      [1]  Allergies Allergen Reactions   Penicillins Anaphylaxis    Diffuse joint pain @ age 32 in context  of Scarlet Fever  Did it involve swelling of the face/tongue/throat, SOB, or low BP? No  Did it involve sudden or severe rash/hives, skin peeling, or any reaction on the inside of your mouth or nose? No  Did you need to seek medical attention at a hospital or doctor's office? No  When did it last happen?  childhood      If all above answers are NO, may proceed with cephalosporin use.

## 2024-11-23 NOTE — ED Notes (Signed)
 Cards to bedside

## 2024-11-24 ENCOUNTER — Telehealth (HOSPITAL_COMMUNITY): Payer: Self-pay

## 2024-11-24 ENCOUNTER — Other Ambulatory Visit (HOSPITAL_COMMUNITY): Payer: Self-pay

## 2024-11-24 DIAGNOSIS — I214 Non-ST elevation (NSTEMI) myocardial infarction: Secondary | ICD-10-CM | POA: Diagnosis not present

## 2024-11-24 LAB — BASIC METABOLIC PANEL WITH GFR
Anion gap: 13 (ref 5–15)
BUN: 24 mg/dL — ABNORMAL HIGH (ref 8–23)
CO2: 23 mmol/L (ref 22–32)
Calcium: 9.3 mg/dL (ref 8.9–10.3)
Chloride: 99 mmol/L (ref 98–111)
Creatinine, Ser: 1.17 mg/dL — ABNORMAL HIGH (ref 0.44–1.00)
GFR, Estimated: 46 mL/min — ABNORMAL LOW
Glucose, Bld: 184 mg/dL — ABNORMAL HIGH (ref 70–99)
Potassium: 4.1 mmol/L (ref 3.5–5.1)
Sodium: 135 mmol/L (ref 135–145)

## 2024-11-24 LAB — GLUCOSE, CAPILLARY
Glucose-Capillary: 163 mg/dL — ABNORMAL HIGH (ref 70–99)
Glucose-Capillary: 166 mg/dL — ABNORMAL HIGH (ref 70–99)
Glucose-Capillary: 178 mg/dL — ABNORMAL HIGH (ref 70–99)
Glucose-Capillary: 184 mg/dL — ABNORMAL HIGH (ref 70–99)
Glucose-Capillary: 184 mg/dL — ABNORMAL HIGH (ref 70–99)
Glucose-Capillary: 198 mg/dL — ABNORMAL HIGH (ref 70–99)

## 2024-11-24 LAB — CBC
HCT: 37.3 % (ref 36.0–46.0)
Hemoglobin: 12.3 g/dL (ref 12.0–15.0)
MCH: 27.8 pg (ref 26.0–34.0)
MCHC: 33 g/dL (ref 30.0–36.0)
MCV: 84.4 fL (ref 80.0–100.0)
Platelets: 296 K/uL (ref 150–400)
RBC: 4.42 MIL/uL (ref 3.87–5.11)
RDW: 14.3 % (ref 11.5–15.5)
WBC: 12.2 K/uL — ABNORMAL HIGH (ref 4.0–10.5)
nRBC: 0 % (ref 0.0–0.2)

## 2024-11-24 MED ORDER — HEPARIN (PORCINE) 25000 UT/250ML-% IV SOLN
1050.0000 [IU]/h | INTRAVENOUS | Status: DC
  Administered 2024-11-24: 1050 [IU]/h via INTRAVENOUS
  Filled 2024-11-24: qty 250

## 2024-11-24 MED ORDER — CARVEDILOL 12.5 MG PO TABS
12.5000 mg | ORAL_TABLET | Freq: Two times a day (BID) | ORAL | Status: DC
  Administered 2024-11-24 – 2024-11-25 (×2): 12.5 mg via ORAL
  Filled 2024-11-24 (×2): qty 1

## 2024-11-24 MED ORDER — CARVEDILOL 6.25 MG PO TABS
6.2500 mg | ORAL_TABLET | Freq: Once | ORAL | Status: AC
  Administered 2024-11-24: 6.25 mg via ORAL
  Filled 2024-11-24: qty 1

## 2024-11-24 MED ORDER — ROSUVASTATIN CALCIUM 5 MG PO TABS
10.0000 mg | ORAL_TABLET | Freq: Every day | ORAL | Status: DC
  Administered 2024-11-24 – 2024-11-25 (×2): 10 mg via ORAL
  Filled 2024-11-24 (×2): qty 2

## 2024-11-24 MED ORDER — INSULIN ASPART 100 UNIT/ML IJ SOLN
0.0000 [IU] | Freq: Three times a day (TID) | INTRAMUSCULAR | Status: DC
  Administered 2024-11-24 (×2): 3 [IU] via SUBCUTANEOUS
  Administered 2024-11-25: 2 [IU] via SUBCUTANEOUS
  Filled 2024-11-24: qty 2
  Filled 2024-11-24 (×2): qty 3

## 2024-11-24 MED ORDER — PANTOPRAZOLE SODIUM 40 MG PO TBEC: 40.0000 mg | DELAYED_RELEASE_TABLET | Freq: Every day | ORAL | Status: DC

## 2024-11-24 MED ORDER — LEVOTHYROXINE SODIUM 75 MCG PO TABS
75.0000 ug | ORAL_TABLET | Freq: Every morning | ORAL | Status: DC
  Administered 2024-11-24 – 2024-11-25 (×2): 75 ug via ORAL
  Filled 2024-11-24 (×2): qty 1

## 2024-11-24 MED ORDER — APIXABAN 5 MG PO TABS
5.0000 mg | ORAL_TABLET | Freq: Two times a day (BID) | ORAL | Status: DC
  Administered 2024-11-24 – 2024-11-25 (×3): 5 mg via ORAL
  Filled 2024-11-24 (×3): qty 1

## 2024-11-24 MED ORDER — LOSARTAN POTASSIUM 25 MG PO TABS
25.0000 mg | ORAL_TABLET | Freq: Every day | ORAL | Status: DC
  Administered 2024-11-24 – 2024-11-25 (×2): 25 mg via ORAL
  Filled 2024-11-24 (×2): qty 1

## 2024-11-24 MED FILL — Verapamil HCl IV Soln 2.5 MG/ML: INTRAVENOUS | Qty: 2 | Status: AC

## 2024-11-24 MED FILL — Heparin Sodium (Porcine) Inj 1000 Unit/ML: INTRAMUSCULAR | Qty: 10 | Status: AC

## 2024-11-24 NOTE — Telephone Encounter (Addendum)
 Pharmacy Patient Advocate Encounter  Insurance verification completed.    The patient is insured through [employer]/ [employer]. Patient has Medicare and is not eligible for a copay card, but may be able to apply for patient assistance or Medicare RX Payment Plan (Patient Must reach out to their plan, if eligible for payment plan), if available.    Ran test claim for Xarelto 20mg  tablet and the current 30 day co-pay is $41.33.  Ran test claim for Eliquis  5mg  tablet and the current 30 day co-pay is $49.73.  This test claim was processed through [facility]- copay amounts may vary at other pharmacies due to pharmacy/plan contracts, or as the patient moves through the different stages of their insurance plan.

## 2024-11-24 NOTE — Inpatient Diabetes Management (Signed)
 Inpatient Diabetes Program Recommendations  AACE/ADA: New Consensus Statement on Inpatient Glycemic Control (2015)  Target Ranges:  Prepandial:   less than 140 mg/dL      Peak postprandial:   less than 180 mg/dL (1-2 hours)      Critically ill patients:  140 - 180 mg/dL   Lab Results  Component Value Date   GLUCAP 184 (H) 11/24/2024   HGBA1C 9.7 (H) 11/23/2024    Latest Reference Range & Units 11/24/24 00:02 11/24/24 06:11 11/24/24 08:53 11/24/24 11:04 11/24/24 16:16  Glucose-Capillary 70 - 99 mg/dL 815 (H) 836 (H) 821 (H) 198 (H) 184 (H)   Review of Glycemic Control  Diabetes history: DM2  Outpatient Diabetes medications:  FreeStyle Libe 2 CGM  70/30: 20 units with breakfast, lunch, and dinner   Current orders for Inpatient glycemic control:  Semglee  10 units BID  Novolog  0-15 units TID   Inpatient Diabetes Program Recommendations:   Spoke with patient and husband at bedside. Patient reports being followed by Endo Dr Tommas for diabetes management  Patient reports taking DM medications as prescribed and has had diabetes over 30 years. Patient reports checking glucose 3-4 times per day and wears a FreeStyle Libre 2 CGM. She is going to discuss with Dr Balan about switching to [employer] 3 and wishes to possibly start using an insulin  pump at next office visit. Discussed A1C results 9.7% and explained that current A1C indicates an average glucose of 232 mg/dl over the past 2-3 months. Patient states over Christmas and her birthday, her diet has not been very good and has been eating junk food.  Discussed glucose and A1C goals. Discussed importance of checking CBGs and maintaining good CBG control to prevent long-term and short-term complications. Stressed to the patient the importance of improving glycemic control to prevent further complications from uncontrolled diabetes. Discussed impact of nutrition, exercise, stress, sickness, and medications on diabetes control.  Discussed  carbohydrates, carbohydrate goals per day and meal, along with portion sizes. Patient states she does try to eat a lot of eggs, greek yogurt, cottage cheese, and chicken for protein. Encouraged patient to check glucose 4 times per day. Patient verbalized understanding of information discussed and reports no further questions at this time related to diabetes.  Thanks,  Lavanda Search, RN, MSN, [facility]  Inpatient Diabetes Coordinator  Pager [phone] (8a-5p)

## 2024-11-24 NOTE — Discharge Instructions (Signed)
Information on my medicine - ELIQUIS (apixaban)  This medication education was reviewed with me or my healthcare representative as part of my discharge preparation.  The pharmacist that spoke with me during my hospital stay was:  Einar Grad, [facility]  Why was Eliquis prescribed for you? Eliquis was prescribed for you to reduce the risk of a blood clot forming that can cause a stroke if you have a medical condition called atrial fibrillation (a type of irregular heartbeat).  What do You need to know about Eliquis ? Take your Eliquis TWICE DAILY - one tablet in the morning and one tablet in the evening with or without food. If you have difficulty swallowing the tablet whole please discuss with your pharmacist how to take the medication safely.  Take Eliquis exactly as prescribed by your doctor and DO NOT stop taking Eliquis without talking to the doctor who prescribed the medication.  Stopping may increase your risk of developing a stroke.  Refill your prescription before you run out.  After discharge, you should have regular check-up appointments with your healthcare provider that is prescribing your Eliquis.  In the future your dose may need to be changed if your kidney function or weight changes by a significant amount or as you get older.  What do you do if you miss a dose? If you miss a dose, take it as soon as you remember on the same day and resume taking twice daily.  Do not take more than one dose of ELIQUIS at the same time to make up a missed dose.  Important Safety Information A possible side effect of Eliquis is bleeding. You should call your healthcare provider right away if you experience any of the following: Bleeding from an injury or your nose that does not stop. Unusual colored urine (red or dark brown) or unusual colored stools (red or black). Unusual bruising for unknown reasons. A serious fall or if you hit your head (even if there is no bleeding).  Some  medicines may interact with Eliquis and might increase your risk of bleeding or clotting while on Eliquis. To help avoid this, consult your healthcare provider or pharmacist prior to using any new prescription or non-prescription medications, including herbals, vitamins, non-steroidal anti-inflammatory drugs (NSAIDs) and supplements.  This website has more information on Eliquis (apixaban): http://[email or web address].com/eliquis/home

## 2024-11-24 NOTE — Evaluation (Signed)
 Physical Therapy Evaluation Patient Details Name: Danielle Murray MRN: [id number] DOB: 01-Jun-1941 Today's Date: 11/24/2024  History of Present Illness  Danielle Murray is a 84 y.o. female adm 11/22/24 for NSTEMI. Pt presented with  sudden onset midsternal sharp chest pain with radiation to her back unrelieved with ASA or nitroglycerin . CTA chest was negative for aortic dissection. Found on EKG to be in atrial fibrillation. Pt underwent left heart cath and coronary angiography 1/12. PMHx: multivessel CAD, prior PCI/DES to LAD (jailed D1), ostial Lcx, HFimpEF 2/2 ICM (35-40% -> 60-65%), T2DM, HTN, HLD, hypothyroidism, OA, and depression.   Clinical Impression  Pt admitted with above diagnosis. PTA, pt was modI for functional mobility using RW vs. rollator. She receives assistance with getting in/out of the car and the house as well as AD management during transitions. She lives with her husband in a one story house with 1 STE. Pt currently with functional limitations due to the deficits listed below (see PT Problem List). She required CGA for bed mobility, transfers, and gait using RW. Pt ambulated greater than a household distance with a step-through gait pattern with various abnormalities. Pt will benefit from acute skilled PT to increase her independence and safety with mobility to allow discharge. Recommend HHPT to increase strength, improve balance, decrease fall risk, advance activity tolerance, and optimize safety within the home environment.       If plan is discharge home, recommend the following: A little help with walking and/or transfers;A little help with bathing/dressing/bathroom;Assistance with cooking/housework;Assist for transportation;Help with stairs or ramp for entrance   Can travel by private vehicle        Equipment Recommendations None recommended by PT  Recommendations for Other Services       Functional Status Assessment Patient has had a recent decline in their functional  status and demonstrates the ability to make significant improvements in function in a reasonable and predictable amount of time.     Precautions / Restrictions Precautions Precautions: Fall Recall of Precautions/Restrictions: Intact Restrictions Weight Bearing Restrictions Per Provider Order: No      Mobility  Bed Mobility Overal bed mobility: Needs Assistance Bed Mobility: Supine to Sit, Sit to Supine     Supine to sit: HOB elevated, Used rails, Contact guard Sit to supine: Contact guard assist   General bed mobility comments: Pt sat up on R side of bed with increased time. She brought BLE off EOB. Pushed up with BUE to elevate trunk. CGA for safety. Returning to bed light assist to bring BLE back in.    Transfers Overall transfer level: Needs assistance Equipment used: Rolling walker (2 wheels) Transfers: Sit to/from Stand Sit to Stand: Contact guard assist           General transfer comment: Pt stood from lowest bed height using RW. She demonstrated proper hand placement using RW. Utilized momentum to aid in powering up. Good eccentric control with sitting.    Ambulation/Gait Ambulation/Gait assistance: Contact guard assist Gait Distance (Feet): 150 Feet Assistive device: Rolling walker (2 wheels) Gait Pattern/deviations: Step-through pattern, Decreased stride length, Trunk flexed, Drifts right/left Gait velocity: decr Gait velocity interpretation: <1.8 ft/sec, indicate of risk for recurrent falls   General Gait Details: Pt ambulated with a reciprocal gait pattern, even weight shift, and good foot clearence. She demonstrated decreased hip/knee flex bilaterally. Pt maintained fwd lean. Cued upright posture and closer proximity to RW. Pt navigated hallway fairly well, no LOB but drifting L/R. She took a wide turn to  navigate obstacles. Pt SOB, facilitated standing rest breaks and cued PLB technique. Questionable pleth reading at times, potential desat to 85% but pt  quickly recovering to 88-91%.  Stairs            Wheelchair Mobility     Tilt Bed    Modified Rankin (Stroke Patients Only)       Balance Overall balance assessment: Needs assistance Sitting-balance support: Feet supported Sitting balance-Leahy Scale: Good Sitting balance - Comments: Sat EOB   Standing balance support: Bilateral upper extremity supported, During functional activity, Reliant on assistive device for balance Standing balance-Leahy Scale: Poor Standing balance comment: Pt dependent on RW                             Pertinent Vitals/Pain Pain Assessment Pain Assessment: No/denies pain    Home Living Family/patient expects to be discharged to:: Private residence Living Arrangements: Spouse/significant other;Children Available Help at Discharge: Family Type of Home: House Home Access: Stairs to enter   Secretary/administrator of Steps: 1   Home Layout: One level Home Equipment: Agricultural Consultant (2 wheels);Rollator (4 wheels);Shower seat - built in;Grab bars - toilet;Grab bars - tub/shower      Prior Function Prior Level of Function : Independent/Modified Independent             Mobility Comments: Ambulates using RW at all times, but reports using rollator when going to church. Husband places device into the car. Pt reports using RW to traverse the one step into her home. Denies fall hx. ADLs Comments: min-mod assist with LE ADLS due to old hip sx     Extremity/Trunk Assessment   Upper Extremity Assessment Upper Extremity Assessment: Defer to OT evaluation    Lower Extremity Assessment Lower Extremity Assessment: Generalized weakness;RLE deficits/detail RLE Deficits / Details: Pt POD 1 s/p right femoral catheterization. Decreased hip and knee AROM. Decreased strength, grossly 3/5. RLE Sensation: WNL RLE Coordination: WNL    Cervical / Trunk Assessment Cervical / Trunk Assessment: Kyphotic  Communication    Communication Communication: No apparent difficulties    Cognition Arousal: Alert Behavior During Therapy: WFL for tasks assessed/performed   PT - Cognitive impairments: No apparent impairments                       PT - Cognition Comments: Pt A,Ox4 Following commands: Intact       Cueing Cueing Techniques: Verbal cues     General Comments General comments (skin integrity, edema, etc.): Husband present and supportive throughout session.    Exercises     Assessment/Plan    PT Assessment Patient needs continued PT services  PT Problem List Decreased strength;Decreased activity tolerance;Decreased balance;Decreased mobility;Cardiopulmonary status limiting activity       PT Treatment Interventions DME instruction;Gait training;Stair training;Functional mobility training;Therapeutic activities;Therapeutic exercise;Balance training;Patient/family education    PT Goals (Current goals can be found in the Care Plan section)  Acute Rehab PT Goals Patient Stated Goal: Return Home and recieve cardiac rehab PT Goal Formulation: With patient/family Time For Goal Achievement: 12/08/24 Potential to Achieve Goals: Good    Frequency Min 2X/week     Co-evaluation               AM-PAC PT 6 Clicks Mobility  Outcome Measure Help needed turning from your back to your side while in a flat bed without using bedrails?: A Little Help needed moving from lying on your back  to sitting on the side of a flat bed without using bedrails?: A Little Help needed moving to and from a bed to a chair (including a wheelchair)?: A Little Help needed standing up from a chair using your arms (e.g., wheelchair or bedside chair)?: A Little Help needed to walk in hospital room?: A Little Help needed climbing 3-5 steps with a railing? : A Lot 6 Click Score: 17    End of Session Equipment Utilized During Treatment: Gait belt Activity Tolerance: Patient tolerated treatment well Patient left:  in bed;with call bell/phone within reach;with bed alarm set;with family/visitor present Nurse Communication: Mobility status;Other (comment) (O2 sat during gait) PT Visit Diagnosis: Muscle weakness (generalized) (M62.81);Other abnormalities of gait and mobility (R26.89)    Time: 8569-8545 PT Time Calculation (min) (ACUTE ONLY): 24 min   Charges:   PT Evaluation $PT Eval Moderate Complexity: 1 Mod PT Treatments $Gait Training: 8-22 mins PT General Charges $$ ACUTE PT VISIT: 1 Visit         Randall SAUNDERS, PT, DPT Acute Rehabilitation Services Office: [phone] Secure Chat Preferred  Delon CHRISTELLA Callander 11/24/2024, 3:14 PM

## 2024-11-24 NOTE — TOC CM/SW Note (Signed)
 Transition of Care [facility]) - Inpatient Brief Assessment   Patient Details  Name: Danielle Murray MRN: [id number] Date of Birth: April 26, 1941  Transition of [facility]) CM/SW Contact:    Waddell Barnie Rama, RN Phone Number: 11/24/2024, 4:15 PM   Clinical Narrative: From home with spouse, has PCP and insurance on file, states has no HH services in place at this time, has walkers x 3, shower chair and grab bars at home.  States family member  (spouse) will transport them home at [employer] and family is support system, states gets medications from [place] on [address].  Pta self ambulatory with walker.   Per PT eval rec HHPT, NCM offered choice, she chose [facility].  NCM sent referral thru portal, they have accepted referral.  Soc will begin 24 to 48 hrs post dc.  They will add HHOT later.       Transition of Care Asessment: Insurance and Status: Insurance coverage has been reviewed Patient has primary care physician: Yes Home environment has been reviewed: home with spouse Prior level of function:: ambulatory with walker Prior/Current Home Services: Current home services (walkers x 3, shower chair, grab bars) Social Drivers of Health Review: SDOH reviewed no interventions necessary Readmission risk has been reviewed: Yes Transition of care needs: transition of care needs identified, TOC will continue to follow

## 2024-11-24 NOTE — Telephone Encounter (Signed)
 Pharmacy Patient Advocate Encounter  Insurance verification completed.    The patient is insured through [employer]/ [employer]. Patient has Medicare and is not eligible for a copay card, but may be able to apply for patient assistance or Medicare RX Payment Plan (Patient Must reach out to their plan, if eligible for payment plan), if available.    Ran test claim for HumuLIN 70/30 KwikPen and the current 30 day co-pay is $27.16.   This test claim was processed through [facility]- copay amounts may vary at other pharmacies due to pharmacy/plan contracts, or as the patient moves through the different stages of their insurance plan.

## 2024-11-24 NOTE — Evaluation (Signed)
 Occupational Therapy Evaluation Patient Details Name: Danielle Murray MRN: [id number] DOB: 1941/01/03 Today's Date: 11/24/2024   History of Present Illness   Danielle Murray is a 84 y.o. female adm 11/22/24 for NSTEMI. Pt presented with  sudden onset midsternal sharp chest pain with radiation to her back unrelieved with ASA or nitroglycerin . CTA chest was negative for aortic dissection. Found on EKG to be in atrial fibrillation. Pt underwent left heart cath and coronary angiography 1/12. PMHx: multivessel CAD, prior PCI/DES to LAD (jailed D1), ostial Lcx, HFimpEF 2/2 ICM (35-40% -> 60-65%), T2DM, HTN, HLD, hypothyroidism, OA, and depression.     Clinical Impressions Pt reported at PLOF they use 4WW in/out of the house with husband assisting with getting in/out of the car. She reported also needing assist with LB dressing. At this time required initially rocking and 2 attempts with min assist to sit to stand from bed and was able to ambulate room level distance with CGA. Then pt further practiced sit to stand transfers with CGA from recliner to RW. At this time pt and wife would like [facility] therapy with the return to home. Acute Occupational Therapy will continue to follow with recommendation for mobility team to also follow.      If plan is discharge home, recommend the following:   A little help with walking and/or transfers;A little help with bathing/dressing/bathroom;Assistance with cooking/housework;Assist for transportation;Help with stairs or ramp for entrance     Functional Status Assessment   Patient has had a recent decline in their functional status and demonstrates the ability to make significant improvements in function in a reasonable and predictable amount of time.     Equipment Recommendations   None recommended by OT     Recommendations for Other Services         Precautions/Restrictions   Precautions Precautions: Fall Recall of Precautions/Restrictions:  Intact Restrictions Weight Bearing Restrictions Per Provider Order: No     Mobility Bed Mobility Overal bed mobility: Needs Assistance Bed Mobility: Supine to Sit     Supine to sit: Min assist, HOB elevated, Used rails          Transfers Overall transfer level: Needs assistance Equipment used: Rolling walker (2 wheels) Transfers: Sit to/from Stand Sit to Stand: Contact guard assist, Min assist           General transfer comment: pt requires min assist with rocking x2 attempts from the bed but then CGA from chair level      Balance Overall balance assessment: Needs assistance Sitting-balance support: Feet supported Sitting balance-Leahy Scale: Good     Standing balance support: Bilateral upper extremity supported Standing balance-Leahy Scale: Fair                             ADL either performed or assessed with clinical judgement   ADL Overall ADL's : Needs assistance/impaired Eating/Feeding: Independent;Sitting   Grooming: Wash/dry hands;Wash/dry face;Set up;Sitting   Upper Body Bathing: Set up;Sitting   Lower Body Bathing: Minimal assistance;Moderate assistance;Sit to/from stand;Sitting/lateral leans   Upper Body Dressing : Set up;Sitting   Lower Body Dressing: Minimal assistance;Moderate assistance;Sit to/from stand   Toilet Transfer: Contact guard assist;Cueing for safety;Cueing for sequencing;Rolling walker (2 wheels)   Toileting- Clothing Manipulation and Hygiene: Contact guard assist;Minimal assistance;Sit to/from stand       Functional mobility during ADLs: Contact guard assist;Rolling walker (2 wheels);Cueing for sequencing;Cueing for safety       Vision  Perception         Praxis         Pertinent Vitals/Pain Pain Assessment Pain Assessment: No/denies pain     Extremity/Trunk Assessment Upper Extremity Assessment Upper Extremity Assessment: Generalized weakness   Lower Extremity Assessment Lower  Extremity Assessment: Defer to PT evaluation   Cervical / Trunk Assessment Cervical / Trunk Assessment: Kyphotic   Communication Communication Communication: No apparent difficulties   Cognition Arousal: Alert Behavior During Therapy: WFL for tasks assessed/performed Cognition: No apparent impairments                               Following commands: Intact       Cueing  General Comments   Cueing Techniques: Verbal cues      Exercises     Shoulder Instructions      Home Living Family/patient expects to be discharged to:: Private residence Living Arrangements: Spouse/significant other;Children Available Help at Discharge: Family Type of Home: House Home Access: Stairs to enter Secretary/administrator of Steps: 1   Home Layout: One level     Bathroom Shower/Tub: Producer, Television/film/video: Standard     Home Equipment: Agricultural Consultant (2 wheels);Rollator (4 wheels);Shower seat - built in;Grab bars - toilet;Grab bars - tub/shower   Additional Comments: Pt reported elevated bed and educated about getting a lower profile matress      Prior Functioning/Environment Prior Level of Function : Independent/Modified Independent             Mobility Comments: 4WW at all times with husband placing device into the car, requires RW with entrance into the bathroom due to narrow space ADLs Comments: min-mod assist with LE ADLS due to old hip sx    OT Problem List: Decreased strength;Impaired balance (sitting and/or standing);Decreased knowledge of use of DME or AE;Decreased safety awareness   OT Treatment/Interventions: Self-care/ADL training;Therapeutic exercise;DME and/or AE instruction;Therapeutic activities;Patient/family education;Balance training      OT Goals(Current goals can be found in the care plan section)   Acute Rehab OT Goals Patient Stated Goal: to return home OT Goal Formulation: With patient Time For Goal Achievement:  12/08/24 Potential to Achieve Goals: Good   OT Frequency:  Min 2X/week    Co-evaluation              AM-PAC OT 6 Clicks Daily Activity     Outcome Measure Help from another person eating meals?: None Help from another person taking care of personal grooming?: None Help from another person toileting, which includes using toliet, bedpan, or urinal?: A Little Help from another person bathing (including washing, rinsing, drying)?: A Little Help from another person to put on and taking off regular upper body clothing?: None Help from another person to put on and taking off regular lower body clothing?: A Little 6 Click Score: 21   End of Session Equipment Utilized During Treatment: Gait belt;Rolling walker (2 wheels) Nurse Communication: Mobility status  Activity Tolerance: Patient tolerated treatment well Patient left: in chair;with call bell/phone within reach;with chair alarm set  OT Visit Diagnosis: Unsteadiness on feet (R26.81);Other abnormalities of gait and mobility (R26.89);Muscle weakness (generalized) (M62.81)                Time: 9084-9045 OT Time Calculation (min): 39 min Charges:  OT General Charges $OT Visit: 1 Visit OT Evaluation $OT Eval Low Complexity: 1 Low OT Treatments $Self Care/Home Management : 23-37 mins  Warrick  K OTR/L  Acute Rehab Services  [phone] office number   Warrick Berber 11/24/2024, 10:05 AM

## 2024-11-24 NOTE — Progress Notes (Signed)
 CARDIAC REHAB PHASE I   Post NSTEMI education including restrictions, risk factors, exercise guidelines, antiplatelet therapy importance, MI booklet, NTG use, heart healthy diabetic diet, and CRP2 reviewed. All questions and concerns addressed. Patient not interested in cardiac rehab at this time. Patient looking to have HH therapy. Will still refer to patients preference [facility]    1:15-1:41 Isaiah JAYSON Liverpool, RN BSN 11/24/2024 1:41 PM

## 2024-11-24 NOTE — Progress Notes (Signed)
 "   Patient Name: Danielle Murray Date of Encounter: 11/24/2024 [facility] Cardiologist: Maude Emmer, MD   Interval Summary  .    No chest pain, blood pressure remains elevated. Regarding catheterization, details below.  Vital Signs .    Vitals:   11/23/24 2126 11/24/24 0007 11/24/24 0614 11/24/24 0715  BP: (!) 151/75 (!) 149/93 (!) 161/73 (!) 140/70  Pulse: 83 84 98 81  Resp: 18 18 18 18   Temp: 98.5 F (36.9 C) 99 F (37.2 C) 98.8 F (37.1 C) 99.1 F (37.3 C)  TempSrc: Oral Oral Oral Oral  SpO2: 94% 95% 94% 96%  Weight:   84.8 kg   Height:        Intake/Output Summary (Last 24 hours) at 11/24/2024 0913 Last data filed at 11/24/2024 0616 Gross per 24 hour  Intake 120 ml  Output 750 ml  Net -630 ml      11/24/2024    6:14 AM 11/23/2024    6:25 PM 11/22/2024   10:53 PM  Last 3 Weights  Weight (lbs) 187 lb 187 lb 9.8 oz 180 lb  Weight (kg) 84.823 kg 85.1 kg 81.647 kg      Telemetry/ECG    11/24/2024 - Personally Reviewed No significant arrhythmia  Cardiac catheterization 11/23/2024:   Prox LAD to Mid LAD lesion is 30% stenosed.   Mid RCA lesion is 70% stenosed.   Ost RCA lesion is 55% stenosed.   1st Diag lesion is 90% stenosed.   1st Mrg lesion is 70% stenosed.   3rd LPL lesion is 70% stenosed.   Prox LAD lesion is 40% stenosed.   Ost LM lesion is 45% stenosed.   Ost LAD lesion is 30% stenosed.   LPAV lesion is 100% stenosed.   Previously placed Ost Cx to Prox Cx stent of unknown type is  widely patent.   LV end diastolic pressure is mildly elevated.   There is no aortic valve stenosis.   In the absence of any other complications or medical issues, we expect the patient to be ready for discharge from a cath perspective on 11/24/2024.   Recommend to resume Apixaban , at currently prescribed dose and frequency on 11/24/2024.   Recommend concurrent antiplatelet therapy of Clopidogrel  75mg  daily for 12 months.   Conclusions: Severe two-vessel coronary  artery disease with occlusion of distal LCx likely representing the culprit lesion for the patient's NSTEMI.  The RPDA is faintly collateralized by left-to-left collaterals.  There is also a 90% stenosis of the jailed diagonal branch, unchanged from prior catheterization.  Moderate disease involving the ostium of the LMCA, ostial/proximal LAD, and nondominant RCA is present as well. Mildly elevated left ventricular filling pressure (LVEDP 20 mmHg). Severe systemic hypertension.   Recommendations: Images reviewed with Dr. Elmira.  We recommend aggressive medical therapy and consideration of outpatient myocardial PET/CT to assess hemodynamic significance of LMCA/LAD disease, particularly if the patient has recurrent symptoms. Aggressive secondary prevention of coronary artery disease. Remove right femoral artery sheath in holding with manual compression to achieve hemostasis. Escalate blood pressure control. Initiate heparin  infusion 8 hours after right femoral hemostasis has been achieved.  Consider transitioning to DOAC as soon as tomorrow if there is no evidence of bleeding or vascular injury.  Would favor treatment with DOAC + clopidogrel  for up to 12 months as tolerated.    Physical Exam .   Physical Exam Vitals and nursing note reviewed.  Constitutional:      General: She is not in  acute distress. Neck:     Vascular: No JVD.  Cardiovascular:     Rate and Rhythm: Normal rate and regular rhythm.     Heart sounds: Normal heart sounds. No murmur heard. Pulmonary:     Effort: Pulmonary effort is normal.     Breath sounds: Normal breath sounds. No wheezing or rales.  Musculoskeletal:     Right lower leg: No edema.     Left lower leg: No edema.      Assessment & Plan .     84 y.o. female w/hypertension, hyperlipidemia, type 2 DM, CAD w/prior PCIs, ischemic cardiomyopathy with improved LVEF, admitted with chest pain, NSTEMI, new diagnosis Afib, Rt renal artery stenosis    NSTEMI: Moderate to severe multivessel CAD, with culprit likely being occluded LPDA, with possibly completed infarct. Residual disease in LM/prox LAD before prior stent, at least moderate, along with disease in nondominant small RCA. Recommend aggressive medical management with outpatient PET/CT stress test to evaluate for LM/LAD ischemia. Not a great candidate for CABG, also tricky anatomy for LM PCI given short left main circumflex. Recommend medical management for now. Recommend Plavix , along with DOAC given new Afib. Okay to skip Aspirin . Continue Coreg , Imdur , statin.  Atrial fibrillation: New finding, rate controlled. Will need outpatient management. Okay to start Eliquis  5 mg bid.   Hypertension: Uncontrolled, but improving. Currently on Coreg  6.25 mg twice daily, losartan  12.5 mg daily, spironolactone  12.5 mg daily, Imdur  30 mg daily. Increase Coreg  to 12.5 twice daily, resume losartan  at 25 mg daily. 75% right renal artery stenosis, could also be contributing. Uptitrate medical management during this hospitalization.  If blood pressure remains uncontrolled, could consider outpatient workup and management for renal artery stenosis.   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 Bladder abnormalities could related to recent UTI, defer management to primary team.   Type 2 diabetes mellitus: Uncontrolled, A1c 9.7%. Goal A1c <7%.  Defer management to primary team.   No additional inpatient from cardiac standpoint.  Arrange outpatient cardiac follow-up.  For questions or updates, please contact  [facility] Please consult [email or web address] for contact info under        Signed, Newman JINNY Lawrence, MD   "

## 2024-11-24 NOTE — Progress Notes (Signed)
 "  PROGRESS NOTE  Danielle Murray  FMW:[id number] DOB: [birth year] DOA: 11/22/2024 PCP: Clarice Nottingham, MD   Brief Narrative: Patient is a 84 year old female with history of coronary disease status post PCI with drug-eluting stent, hypertension, hyperlipidemia, hypothyroidism, type 2 diabetes who presented with chest pain from home.  Chest pain was sharp in nature with radiation to the back .  On presentation, she was given sublingual nitroglycerin , fentanyl .  Chest pain completely resolved.  She remained mildly hypertensive in the ED.  Initial troponin was 43 which increased to the range of 300s.  Started on heparin  drip for the suspicion of NSTEMI.  Cardiology consulted.  Underwent cardiac cath which showed moderate to severe multivessel coronary artery disease.  Recommend aggressive medical management with outpatient PET/CT stress test to evaluate for LM/LAD ischemia.  Plan for discharge tomorrow.  PT evaluation pending.  Assessment & Plan:  Principal Problem:   NSTEMI (non-ST elevated myocardial infarction) (HCC) Active Problems:   Acquired hypothyroidism   Essential hypertension   DM2 (diabetes mellitus, type 2) (HCC)   HLD (hyperlipidemia)   Chest pain   Elevated troponin   Pneumaturia   Paroxysmal atrial fibrillation (HCC)    NSTEMI: Presented with chest pain.  History of coronary artery disease.  Elevated troponin with positive delta.  Started on heparin  drip.   Echo showed EF of 50 to 55%, regional wall motion abnormalities.  Underwent cardiac cath which showed moderate to severe multivessel coronary artery disease.  Recommend aggressive medical management with outpatient PET/CT stress test to evaluate for LM/LAD ischemia.  Continue Plavix  along with Eliquis .  She will follow-up with cardiology as an outpatient  New onset A-fib: New problem.  Rate controlled.  Admission EKG showed A-fib rhythm.  TSH normal.  Started on Eliquis .  On Coreg  for rate control.  She was in normal sinus  rhythm this morning  Pneumaturia: Incidentally noted on CTA chest.  History of recent UTI .No abdominal discomfort.  Abdomen is benign on examination.  No dysuria.  UA could not rule out UTI, has mild leukocytes.  Will follow-up urine culture.  Currently on ceftriaxone .  Hypertension: Takes Imdur , Coreg , Lasix , losartan  at home.  Medications have been optimized.  Dose of Coreg  increased  Hyperlipidemia: On rosuvastatin   Hypothyroidism: Continue Synthyroid  Type 2 diabetes: Takes 70/30 insulin  at home.  Monitor blood sugars. Recent A1c of 7.5.  Generalized weakness/deconditioning: PT consulted        DVT prophylaxis:Eliquis      Code Status: Full Code  Family Communication: Husband at bedside  Patient status: Inpatient  Patient is from :Home  Anticipated discharge un:Ynfz  Estimated DC date:tomorrow   Consultants: Cardiology  Procedures:Cardiac cath  Antimicrobials:  Anti-infectives (From admission, onward)    Start     Dose/Rate Route Frequency Ordered Stop   11/23/24 1800  cefTRIAXone  (ROCEPHIN ) 1 g in sodium chloride  0.9 % 100 mL IVPB        1 g 200 mL/hr over 30 Minutes Intravenous Every 24 hours 11/23/24 1558         Subjective: Patient seen and examined at bedside today.  Hemodynamically stable.  Lying on bed.  Appears overall comfortable.  Appears weaker but not in any acute distress.  Denies any chest pain this morning.  Objective: Vitals:   11/23/24 2126 11/24/24 0007 11/24/24 0614 11/24/24 0715  BP: (!) 151/75 (!) 149/93 (!) 161/73 (!) 140/70  Pulse: 83 84 98 81  Resp: 18 18 18 18   Temp: 98.5 F (36.9  C) 99 F (37.2 C) 98.8 F (37.1 C) 99.1 F (37.3 C)  TempSrc: Oral Oral Oral Oral  SpO2: 94% 95% 94% 96%  Weight:   84.8 kg   Height:        Intake/Output Summary (Last 24 hours) at 11/24/2024 0738 Last data filed at 11/24/2024 0616 Gross per 24 hour  Intake 120 ml  Output 750 ml  Net -630 ml   Filed Weights   11/22/24 2253 11/23/24 1825  11/24/24 0614  Weight: 81.6 kg 85.1 kg 84.8 kg    Examination:  General exam: Overall comfortable, not in distress, obese HEENT: PERRL Respiratory system:  no wheezes or crackles  Cardiovascular system: S1 & S2 heard, RRR.  Gastrointestinal system: Abdomen is nondistended, soft and nontender. Central nervous system: Alert and oriented Extremities: Trace bilateral lower extremity pitting edema, no clubbing ,no cyanosis Skin: No rashes, no ulcers,no icterus     Data Reviewed: I have personally reviewed following labs and imaging studies  CBC: Recent Labs  Lab 11/22/24 2313 11/22/24 2321 11/23/24 0354  WBC 8.8  --  9.9  NEUTROABS 6.4  --  8.1*  HGB 13.1 13.3 11.2*  HCT 42.3 39.0 34.5*  MCV 89.1  --  86.5  PLT 324  --  275   Basic Metabolic Panel: Recent Labs  Lab 11/22/24 2313 11/22/24 2321 11/23/24 0354  NA 135 138 135  K 4.5 4.3 4.7  CL 102 106 103  CO2 20*  --  21*  GLUCOSE 281* 275* 288*  BUN 26* 28* 24*  CREATININE 1.15* 1.20* 1.08*  CALCIUM  9.8  --  9.2  MG  --   --  2.0     No results found for this or any previous visit (from the past 240 hours).   Radiology Studies: CARDIAC CATHETERIZATION Result Date: 11/23/2024   Prox LAD to Mid LAD lesion is 30% stenosed.   Mid RCA lesion is 70% stenosed.   Ost RCA lesion is 55% stenosed.   1st Diag lesion is 90% stenosed.   1st Mrg lesion is 70% stenosed.   3rd LPL lesion is 70% stenosed.   Prox LAD lesion is 40% stenosed.   Ost LM lesion is 45% stenosed.   Ost LAD lesion is 30% stenosed.   LPAV lesion is 100% stenosed.   Previously placed Ost Cx to Prox Cx stent of unknown type is  widely patent.   LV end diastolic pressure is mildly elevated.   There is no aortic valve stenosis.   In the absence of any other complications or medical issues, we expect the patient to be ready for discharge from a cath perspective on 11/24/2024.   Recommend to resume Apixaban , at currently prescribed dose and frequency on 11/24/2024.    Recommend concurrent antiplatelet therapy of Clopidogrel  75mg  daily for 12 months. Conclusions: Severe two-vessel coronary artery disease with occlusion of distal LCx likely representing the culprit lesion for the patient's NSTEMI.  The RPDA is faintly collateralized by left-to-left collaterals.  There is also a 90% stenosis of the jailed diagonal branch, unchanged from prior catheterization.  Moderate disease involving the ostium of the LMCA, ostial/proximal LAD, and nondominant RCA is present as well. Mildly elevated left ventricular filling pressure (LVEDP 20 mmHg). Severe systemic hypertension. Recommendations: Images reviewed with Dr. Elmira.  We recommend aggressive medical therapy and consideration of outpatient myocardial PET/CT to assess hemodynamic significance of LMCA/LAD disease, particularly if the patient has recurrent symptoms. Aggressive secondary prevention of coronary artery disease. Remove  right femoral artery sheath in holding with manual compression to achieve hemostasis. Escalate blood pressure control. Initiate heparin  infusion 8 hours after right femoral hemostasis has been achieved.  Consider transitioning to DOAC as soon as tomorrow if there is no evidence of bleeding or vascular injury.  Would favor treatment with DOAC + clopidogrel  for up to 12 months as tolerated. Lonni Hanson, MD [facility]  ECHOCARDIOGRAM COMPLETE Result Date: 11/23/2024    ECHOCARDIOGRAM REPORT   Patient Name:   Danielle Murray Date of Exam: 11/23/2024 Medical Rec #:  [id number]       Height:       64.0 in Accession #:    [id number]      Weight:       180.0 lb Date of Birth:  04/06/1941        BSA:          1.871 m Patient Age:    84 years        BP:           181/98 mmHg Patient Gender: F               HR:           77 bpm. Exam Location:  Inpatient Procedure: 2D Echo, Color Doppler, Cardiac Doppler and Intracardiac            Opacification Agent (Both Spectral and Color Flow Doppler were             utilized during procedure). Indications:    NSTEMI I21.4  History:        Patient has prior history of Echocardiogram examinations, most                 recent 09/25/2022. CAD and Previous Myocardial Infarction; Risk                 Factors:Hypertension and Diabetes.  Sonographer:    Tinnie Gosling RDCS Referring Phys: [id number] JUSTIN B HOWERTER IMPRESSIONS  1. Left ventricular ejection fraction, by estimation, is 50 to 55%. The left ventricle has low normal function. The left ventricle demonstrates regional wall motion abnormalities (see scoring diagram/findings for description). Left ventricular diastolic  parameters are indeterminate.  2. Right ventricular systolic function is low normal. The right ventricular size is normal.  3. Left atrial size was mildly dilated.  4. A small pericardial effusion is present.  5. The mitral valve is degenerative. Trivial mitral valve regurgitation.  6. The aortic valve is tricuspid. There is mild calcification of the aortic valve. Aortic valve regurgitation is not visualized. No aortic stenosis is present.  7. The inferior vena cava is dilated in size with <50% respiratory variability, suggesting right atrial pressure of 15 mmHg. Comparison(s): Changes from prior study are noted. Conclusion(s)/Recommendation(s): EF is overall low normal, but there are focal wall motion abnormalities with hypokinesis to near akinesis in septum and inferior walls as noted. FINDINGS  Left Ventricle: Left ventricular ejection fraction, by estimation, is 50 to 55%. The left ventricle has low normal function. The left ventricle demonstrates regional wall motion abnormalities. Definity  contrast agent was given IV to delineate the left ventricular endocardial borders. The left ventricular internal cavity size was normal in size. There is no left ventricular hypertrophy. Left ventricular diastolic parameters are indeterminate.  LV Wall Scoring: The entire septum and entire inferior wall are hypokinetic.  The mid and distal anterior wall, mid and distal lateral wall, posterior wall, mid anterolateral segment, and apex are normal. Right  Ventricle: The right ventricular size is normal. Right vetricular wall thickness was not well visualized. Right ventricular systolic function is low normal. Left Atrium: Left atrial size was mildly dilated. Right Atrium: Right atrial size was not well visualized. Pericardium: A small pericardial effusion is present. Mitral Valve: The mitral valve is degenerative in appearance. There is mild thickening of the mitral valve leaflet(s). There is mild calcification of the mitral valve leaflet(s). Mild to moderate mitral annular calcification. Trivial mitral valve regurgitation. Tricuspid Valve: The tricuspid valve is not well visualized. Tricuspid valve regurgitation is trivial. No evidence of tricuspid stenosis. Aortic Valve: The aortic valve is tricuspid. There is mild calcification of the aortic valve. Aortic valve regurgitation is not visualized. No aortic stenosis is present. Pulmonic Valve: The pulmonic valve was not well visualized. Pulmonic valve regurgitation is trivial. No evidence of pulmonic stenosis. Aorta: The aortic root and ascending aorta are structurally normal, with no evidence of dilitation. Venous: The inferior vena cava is dilated in size with less than 50% respiratory variability, suggesting right atrial pressure of 15 mmHg. IAS/Shunts: The interatrial septum was not well visualized.  LEFT VENTRICLE PLAX 2D LVIDd:         5.20 cm LVIDs:         3.40 cm LV PW:         1.00 cm LV IVS:        1.00 cm LVOT diam:     2.00 cm LV SV:         49 LV SV Index:   26 LVOT Area:     3.14 cm  LV Volumes (MOD) LV vol d, MOD A2C: 78.2 ml LV vol d, MOD A4C: 96.1 ml LV vol s, MOD A2C: 34.8 ml LV vol s, MOD A4C: 52.7 ml LV SV MOD A2C:     43.4 ml LV SV MOD A4C:     96.1 ml LV SV MOD BP:      42.3 ml RIGHT VENTRICLE            IVC RV S prime:     9.95 cm/s  IVC diam: 2.20 cm TAPSE  (M-mode): 1.6 cm LEFT ATRIUM             Index LA Vol (A2C):   63.9 ml 34.16 ml/m LA Vol (A4C):   52.1 ml 27.85 ml/m LA Biplane Vol: 59.8 ml 31.97 ml/m  AORTIC VALVE LVOT Vmax:   78.30 cm/s LVOT Vmean:  54.200 cm/s LVOT VTI:    0.156 m  AORTA Ao Root diam: 2.60 cm Ao Asc diam:  2.80 cm MITRAL VALVE MV Area (PHT): 3.46 cm     SHUNTS MV Decel Time: 219 msec     Systemic VTI:  0.16 m MV E velocity: 132.00 cm/s  Systemic Diam: 2.00 cm Shelda Bruckner MD Electronically signed by Shelda Bruckner MD Signature Date/Time: 11/23/2024/2:19:04 PM    Final    CT Angio Chest/Abd/Pel for Dissection W and/or Wo Contrast Result Date: 11/23/2024 EXAM: CTA CHEST, ABDOMEN AND PELVIS WITH AND WITHOUT CONTRAST 11/23/2024 12:57:00 AM TECHNIQUE: CTA of the chest was performed with and without the administration of 100 mL of intravenous iohexol  (OMNIPAQUE ) 350 MG/ML injection. CTA of the abdomen and pelvis was performed with and without the administration of 100 mL of intravenous iohexol  (OMNIPAQUE ) 350 MG/ML injection. Multiplanar reformatted images are provided for review. MIP images are provided for review. Automated exposure control, iterative reconstruction, and/or weight based adjustment of the mA/kV was utilized to reduce  the radiation dose to as low as reasonably achievable. COMPARISON: Prior study dated 08/XX/2023. CLINICAL HISTORY: Acute aortic syndrome (AAS). Chest pain. FINDINGS: VASCULATURE: AORTA: Extensive streak artifact related to contrast within the superior vena cava results in artifactual filling defects within the ascending thoracic aorta. No intramural hematoma, dissection, or thoracic aortic aneurysm. Moderate atherosclerotic calcification within the thoracic aorta. No abdominal aortic aneurysm. PULMONARY ARTERIES: Extensive streak artifact related to contrast within the superior vena cava results in artifactual filling defects within the central pulmonary arterial tree. No definite pulmonary  embolism. GREAT VESSELS OF AORTIC ARCH: No acute finding. No dissection. No arterial occlusion or significant stenosis. CELIAC TRUNK: No acute finding. No occlusion or significant stenosis. SUPERIOR MESENTERIC ARTERY: No acute finding. No occlusion or significant stenosis. INFERIOR MESENTERIC ARTERY: Greater than 75% stenosis of the inferior mesenteric artery at its origin. This is of questionable clinical significance given the wide patency of the remaining mesenteric vasculature. RENAL ARTERIES: Greater than 75% stenosis of the right renal artery at its origin secondary to calcified atherosclerotic plaque. Less than 50% stenosis of the left renal artery at its origin. Both renal arteries demonstrate normal vascular morphology. No aneurysm or dissection. ILIAC ARTERIES: No acute finding. No occlusion or significant stenosis. CHEST: MEDIASTINUM: Extensive multivessel coronary artery calcifications. Mild cardiomegaly. No mediastinal lymphadenopathy. The heart and pericardium demonstrate no acute abnormality. LUNGS AND PLEURA: The lungs are without acute process. No focal consolidation or pulmonary edema. No evidence of pleural effusion or pneumothorax. THORACIC BONES AND SOFT TISSUES: No acute bone or soft tissue abnormality. ABDOMEN AND PELVIS: LIVER: The liver is unremarkable. GALLBLADDER AND BILE DUCTS: Gallbladder is unremarkable. No biliary ductal dilatation. SPLEEN: The spleen is unremarkable. PANCREAS: The pancreas is unremarkable. ADRENAL GLANDS: 10 mm nodule within the left adrenal gland is compatible with a benign adrenal adenoma for which no follow up imaging is recommended. KIDNEYS, URETERS AND BLADDER: No stones in the kidneys or ureters. No hydronephrosis. No perinephric or periureteral stranding. Moderate volume nondependent gas is seen within the bladder lumen which is nonspecific and can be seen with recent catheterization though aggressive infection or interfascicular fistula could results in  similar appearance. Notably, there is effacement of the intervening fat plane between the bladder and several diverticula of the sigmoid colon involving the dome (series 263, image 6; series 136, image 10). Correlation with urinalysis and urine culture may be helpful for confirmation. GI AND BOWEL: Small hiatal hernia. There is severe descending and sigmoid colonic diverticulosis without superimposed acute inflammatory change identified. The stomach, small bowel, and large bowel are otherwise unremarkable. Appendix normal. Small fat-containing umbilical hernia. There is no bowel obstruction. No abnormal bowel wall thickening or distension. REPRODUCTIVE: Uterus absent. No adnexal masses. PERITONEUM AND RETROPERITONEUM: No ascites or free air. LYMPH NODES: No lymphadenopathy. ABDOMINAL BONES AND SOFT TISSUES: Status post left hip ORIF. Moderate-to-severe bilateral degenerative hip arthritis, asymmetrically more severe on the left. Osseous structures are otherwise age appropriate. No acute bone abnormality. No lytic or blastic bone lesion. No acute soft tissue abnormality. IMPRESSION: 1. No evidence of acute aortic syndrome. 2. No definite pulmonary embolism, limited by streak artifact. 3. Extensive multivessel coronary artery calcifications and mild cardiomegaly. 4. Greater than 75% stenosis of the right renal artery origin due to calcified atherosclerotic plaque. Correlation for clinical evidence of hemodynamically significant renal artery stenosis is recommended. 5. Greater than 75% stenosis of the inferior mesenteric artery origin, unlikely of clinical significance given wide patency of the remaining mesenteric vasculature. . 6. Moderate  nondependent intraluminal bladder gas with effacement of the fat plane between the bladder dome and adjacent sigmoid diverticula. While this is most commonly seen in the setting of recent bladder catheterization, aggressive infection or potentially choledochal fistula could result  in a similar appearance. Correlation with urinalysis and urine culture would be helpful for further evaluation. 7. Severe descending and sigmoid colonic diverticulosis without acute inflammatory change. 8. Small hiatal hernia. 9. 10 mm left adrenal adenoma, with no follow-up imaging recommended. 10. Raf score includes aortic atherosclerosis (ICD10-I70.0). Electronically signed by: Dorethia Molt MD MD 11/23/2024 01:23 AM EST RP Workstation: [id number]   DG Chest Portable 1 View Result Date: 11/22/2024 EXAM: 1 VIEW(S) XRAY OF THE CHEST 11/22/2024 11:30:00 PM COMPARISON: None available. CLINICAL HISTORY: FINDINGS: LINES, TUBES AND DEVICES: EKG leads noted. LUNGS AND PLEURA: No focal pulmonary opacity. No pleural effusion. No pneumothorax. HEART AND MEDIASTINUM: Mild cardiomegaly. Aortic atherosclerosis. BONES AND SOFT TISSUES: No acute osseous abnormality. IMPRESSION: 1. No acute findings. 2. Mild cardiomegaly and aortic atherosclerosis. Electronically signed by: Dorethia Molt MD MD 11/22/2024 11:34 PM EST RP Workstation: [id number]    Scheduled Meds:  carvedilol   6.25 mg Oral BID WC   clopidogrel   75 mg Oral Daily   furosemide   40 mg Oral Daily   insulin  aspart  0-9 Units Subcutaneous Q6H   insulin  glargine-yfgn  10 Units Subcutaneous BID   sodium chloride  flush  3 mL Intravenous Q12H   Continuous Infusions:  sodium chloride      cefTRIAXone  (ROCEPHIN )  IV 1 g (11/23/24 1844)   heparin  1,050 Units/hr (11/24/24 0111)     LOS: 1 day   Ivonne Mustache, MD [facility] P1/13/2026, 7:38 AM  "

## 2024-11-25 ENCOUNTER — Other Ambulatory Visit (HOSPITAL_COMMUNITY): Payer: Self-pay

## 2024-11-25 DIAGNOSIS — I214 Non-ST elevation (NSTEMI) myocardial infarction: Secondary | ICD-10-CM | POA: Diagnosis not present

## 2024-11-25 LAB — CBC
HCT: 35.2 % — ABNORMAL LOW (ref 36.0–46.0)
Hemoglobin: 11.5 g/dL — ABNORMAL LOW (ref 12.0–15.0)
MCH: 27.6 pg (ref 26.0–34.0)
MCHC: 32.7 g/dL (ref 30.0–36.0)
MCV: 84.6 fL (ref 80.0–100.0)
Platelets: 283 K/uL (ref 150–400)
RBC: 4.16 MIL/uL (ref 3.87–5.11)
RDW: 14.3 % (ref 11.5–15.5)
WBC: 10.3 K/uL (ref 4.0–10.5)
nRBC: 0 % (ref 0.0–0.2)

## 2024-11-25 LAB — BASIC METABOLIC PANEL WITH GFR
Anion gap: 13 (ref 5–15)
BUN: 32 mg/dL — ABNORMAL HIGH (ref 8–23)
CO2: 22 mmol/L (ref 22–32)
Calcium: 8.6 mg/dL — ABNORMAL LOW (ref 8.9–10.3)
Chloride: 98 mmol/L (ref 98–111)
Creatinine, Ser: 1.41 mg/dL — ABNORMAL HIGH (ref 0.44–1.00)
GFR, Estimated: 37 mL/min — ABNORMAL LOW
Glucose, Bld: 114 mg/dL — ABNORMAL HIGH (ref 70–99)
Potassium: 3.9 mmol/L (ref 3.5–5.1)
Sodium: 132 mmol/L — ABNORMAL LOW (ref 135–145)

## 2024-11-25 LAB — LIPID PANEL
Cholesterol: 100 mg/dL (ref 0–200)
HDL: 32 mg/dL — ABNORMAL LOW
LDL Cholesterol: 41 mg/dL (ref 0–99)
Total CHOL/HDL Ratio: 3.1 ratio
Triglycerides: 131 mg/dL
VLDL: 26 mg/dL (ref 0–40)

## 2024-11-25 LAB — URINE CULTURE: Culture: >=100000 — AB

## 2024-11-25 LAB — GLUCOSE, CAPILLARY: Glucose-Capillary: 123 mg/dL — ABNORMAL HIGH (ref 70–99)

## 2024-11-25 MED ORDER — CIPROFLOXACIN HCL 500 MG PO TABS
500.0000 mg | ORAL_TABLET | Freq: Two times a day (BID) | ORAL | 0 refills | Status: AC
  Filled 2024-11-25: qty 6, 3d supply, fill #0

## 2024-11-25 MED ORDER — ROSUVASTATIN CALCIUM 20 MG PO TABS
20.0000 mg | ORAL_TABLET | Freq: Every day | ORAL | 0 refills | Status: AC
  Filled 2024-11-25: qty 30, 30d supply, fill #0

## 2024-11-25 MED ORDER — APIXABAN 5 MG PO TABS
5.0000 mg | ORAL_TABLET | Freq: Two times a day (BID) | ORAL | 0 refills | Status: AC
  Filled 2024-11-25: qty 60, 30d supply, fill #0

## 2024-11-25 MED ORDER — LOSARTAN POTASSIUM 25 MG PO TABS
25.0000 mg | ORAL_TABLET | Freq: Every day | ORAL | 0 refills | Status: AC
  Filled 2024-11-25: qty 30, 30d supply, fill #0

## 2024-11-25 MED ORDER — ROSUVASTATIN CALCIUM 20 MG PO TABS: 20.0000 mg | ORAL_TABLET | Freq: Every day | ORAL | Status: DC

## 2024-11-25 MED ORDER — CIPROFLOXACIN HCL 500 MG PO TABS
500.0000 mg | ORAL_TABLET | Freq: Two times a day (BID) | ORAL | Status: DC
  Administered 2024-11-25: 500 mg via ORAL
  Filled 2024-11-25 (×2): qty 1

## 2024-11-25 MED ORDER — CARVEDILOL 12.5 MG PO TABS
12.5000 mg | ORAL_TABLET | Freq: Two times a day (BID) | ORAL | 0 refills | Status: AC
  Filled 2024-11-25: qty 60, 30d supply, fill #0

## 2024-11-25 NOTE — TOC Transition Note (Signed)
 Transition of Care [facility]) - Discharge Note   Patient Details  Name: Danielle Murray MRN: [id number] Date of Birth: [birth year]  [facility]) CM/SW Contact:  Waddell Barnie Rama, RN Phone Number: 11/25/2024, 10:42 AM   Clinical Narrative:    For dc today, she is set up with [facility], NCM notified [facility].  She has transport.         Patient Goals and CMS Choice            Discharge Placement                       Discharge Plan and Services Additional resources added to the After Visit Summary for                                       Social Drivers of Health (SDOH) Interventions SDOH Screenings   Food Insecurity: No Food Insecurity (11/23/2024)  Housing: Low Risk (11/23/2024)  Transportation Needs: No Transportation Needs (11/23/2024)  Utilities: Not At Risk (11/23/2024)  Social Connections: Socially Integrated (11/23/2024)  Tobacco Use: Low Risk (11/23/2024)     Readmission Risk Interventions    11/24/2024    4:09 PM 06/28/2022   10:03 AM  Readmission Risk Prevention Plan  Post Dischage Appt Complete   Medication Screening Complete   Transportation Screening Complete Complete  PCP or Specialist Appt within 3-5 Days  Complete  HRI or Home Care Consult  Complete  Social Work Consult for Recovery Care Planning/Counseling  Complete  Palliative Care Screening  Not Applicable  Medication Review [employer])  Referral to Pharmacy

## 2024-11-25 NOTE — Progress Notes (Signed)
 Pt discharged home with [facility] services. Discharge instructions gone over with pt prior to discharge including medication changes and follow up appointments. Pt with new medications prescribed and to be picked up from [facility] pharmacy on way out. Pts IV and tele monitor removed prior to discharge. Pt gathered all belongings and changed into regular clothes. Pt transported to main entrance via wheelchair and pt driven home by friend/family.

## 2024-11-25 NOTE — Plan of Care (Signed)
  Problem: Education: Goal: Ability to describe self-care measures that may prevent or decrease complications (Diabetes Survival Skills Education) will improve Outcome: Adequate for Discharge Goal: Individualized Educational Video(s) Outcome: Adequate for Discharge   Problem: Coping: Goal: Ability to adjust to condition or change in health will improve Outcome: Adequate for Discharge   Problem: Fluid Volume: Goal: Ability to maintain a balanced intake and output will improve Outcome: Adequate for Discharge   Problem: Health Behavior/Discharge Planning: Goal: Ability to identify and utilize available resources and services will improve Outcome: Adequate for Discharge Goal: Ability to manage health-related needs will improve Outcome: Adequate for Discharge   Problem: Metabolic: Goal: Ability to maintain appropriate glucose levels will improve Outcome: Adequate for Discharge   Problem: Nutritional: Goal: Maintenance of adequate nutrition will improve Outcome: Adequate for Discharge Goal: Progress toward achieving an optimal weight will improve Outcome: Adequate for Discharge   Problem: Skin Integrity: Goal: Risk for impaired skin integrity will decrease Outcome: Adequate for Discharge   Problem: Tissue Perfusion: Goal: Adequacy of tissue perfusion will improve Outcome: Adequate for Discharge   Problem: Education: Goal: Knowledge of General Education information will improve Description: Including pain rating scale, medication(s)/side effects and non-pharmacologic comfort measures Outcome: Adequate for Discharge   Problem: Health Behavior/Discharge Planning: Goal: Ability to manage health-related needs will improve Outcome: Adequate for Discharge   Problem: Clinical Measurements: Goal: Ability to maintain clinical measurements within normal limits will improve Outcome: Adequate for Discharge Goal: Will remain free from infection Outcome: Adequate for Discharge Goal:  Diagnostic test results will improve Outcome: Adequate for Discharge Goal: Respiratory complications will improve Outcome: Adequate for Discharge Goal: Cardiovascular complication will be avoided Outcome: Adequate for Discharge   Problem: Activity: Goal: Risk for activity intolerance will decrease Outcome: Adequate for Discharge   Problem: Nutrition: Goal: Adequate nutrition will be maintained Outcome: Adequate for Discharge   Problem: Coping: Goal: Level of anxiety will decrease Outcome: Adequate for Discharge   Problem: Elimination: Goal: Will not experience complications related to bowel motility Outcome: Adequate for Discharge Goal: Will not experience complications related to urinary retention Outcome: Adequate for Discharge   Problem: Pain Managment: Goal: General experience of comfort will improve and/or be controlled Outcome: Adequate for Discharge   Problem: Safety: Goal: Ability to remain free from injury will improve Outcome: Adequate for Discharge   Problem: Skin Integrity: Goal: Risk for impaired skin integrity will decrease Outcome: Adequate for Discharge   Problem: Education: Goal: Understanding of CV disease, CV risk reduction, and recovery process will improve Outcome: Adequate for Discharge Goal: Individualized Educational Video(s) Outcome: Adequate for Discharge   Problem: Activity: Goal: Ability to return to baseline activity level will improve Outcome: Adequate for Discharge   Problem: Cardiovascular: Goal: Ability to achieve and maintain adequate cardiovascular perfusion will improve Outcome: Adequate for Discharge Goal: Vascular access site(s) Level 0-1 will be maintained Outcome: Adequate for Discharge   Problem: Health Behavior/Discharge Planning: Goal: Ability to safely manage health-related needs after discharge will improve Outcome: Adequate for Discharge

## 2024-11-25 NOTE — Discharge Summary (Signed)
 Physician Discharge Summary  Danielle Murray FMW:[id number] DOB: 15-Dec-1940 DOA: 11/22/2024  PCP: Clarice Nottingham, MD  Admit date: 11/22/2024 Discharge date: 11/25/2024  Admitted From: Home Disposition:  Home  Discharge Condition:Stable CODE STATUS:FULL Diet recommendation: Heart Healthy    Brief/Interim Summary: Patient is a 84 year old female with history of coronary disease status post PCI with drug-eluting stent, hypertension, hyperlipidemia, hypothyroidism, type 2 diabetes who presented with chest pain from home.  Chest pain was sharp in nature with radiation to the back .  On presentation, she was given sublingual nitroglycerin , fentanyl .  Chest pain completely resolved.  She remained mildly hypertensive in the ED.  Initial troponin was 43 which increased to the range of 300s.  Started on heparin  drip for the suspicion of NSTEMI.  Cardiology consulted.  Underwent cardiac cath which showed moderate to severe multivessel coronary artery disease.  Recommend aggressive medical management with outpatient PET/CT stress test to evaluate for LM/LAD ischemia.  Cardiology cleared for discharge.  Medically stable for discharge home today  Following problems were addressed during the hospitalization:  NSTEMI: Presented with chest pain.  History of coronary artery disease.  Elevated troponin with positive delta.  Started on heparin  drip.   Echo showed EF of 50 to 55%, regional wall motion abnormalities.  Underwent cardiac cath which showed moderate to severe multivessel coronary artery disease.  Recommend aggressive medical management with outpatient PET/CT stress test to evaluate for LM/LAD ischemia.  Continue Plavix  along with Eliquis .  She will follow-up with cardiology as an outpatient   New onset A-fib: New problem.  Rate controlled.  Admission EKG showed A-fib rhythm.  TSH normal.  Started on Eliquis .  On Coreg  for rate control.  On normal sinus rhythm this morning   Pneumaturia: Incidentally noted  on CTA chest.  History of recent UTI .No abdominal discomfort.  Abdomen is benign on examination.  No dysuria.  UA could not rule out UTI, has mild leukocytes.  Urine culture showed Raoultella Planticola.  She was on ceftriaxone , changed to Cipro  on discharge.   Hypertension: Takes Imdur , Coreg , Lasix , losartan  at home.  Medications have been optimized.  Dose of Coreg  increased   Hyperlipidemia: On rosuvastatin  20 mg daily   Hypothyroidism: Continue Synthyroid   Type 2 diabetes: Takes 70/30 insulin  at home.  Monitor blood sugars. Recent A1c of 9.7.  She needs to follow-up with her primary care physician closely for the management of her diabetes.   Generalized weakness/deconditioning: PT consulted, recommended home health     Discharge Diagnoses:  Principal Problem:   NSTEMI (non-ST elevated myocardial infarction) (HCC) Active Problems:   Acquired hypothyroidism   Essential hypertension   DM2 (diabetes mellitus, type 2) (HCC)   HLD (hyperlipidemia)   Chest pain   Elevated troponin   Pneumaturia   Paroxysmal atrial fibrillation [facility])    Discharge Instructions  Discharge Instructions     Amb Referral to Cardiac Rehabilitation   Complete by: As directed    [place]   Diagnosis: NSTEMI   After initial evaluation and assessments completed: Virtual Based Care may be provided alone or in conjunction with Phase 2 Cardiac Rehab based on patient barriers.: Yes   Intensive Cardiac Rehabilitation (ICR) [facility] location only OR Traditional Cardiac Rehabilitation (TCR) *If criteria for ICR are not met will enroll in [facility] only): Yes   Discharge instructions   Complete by: As directed    1)Please take your medications as instructed 2)Follow up with your PCP in a week.  Monitor blood sugars  at home 3)You need to call cardiology for follow-up appointment 4)Follow up with  home health services   Increase activity slowly   Complete by: As directed       Allergies as of 11/25/2024        Reactions   Penicillins Anaphylaxis   Diffuse joint pain @ age 68 in context of Scarlet Fever Did it involve swelling of the face/tongue/throat, SOB, or low BP? No Did it involve sudden or severe rash/hives, skin peeling, or any reaction on the inside of your mouth or nose? No Did you need to seek medical attention at a hospital or doctor's office? No When did it last happen?  childhood     If all above answers are NO, may proceed with cephalosporin use.        Medication List     STOP taking these medications    aspirin  EC 81 MG tablet   spironolactone  25 MG tablet Commonly known as: ALDACTONE        TAKE these medications    acetaminophen  500 MG tablet Commonly known as: TYLENOL  Take 1,000 mg by mouth every 6 (six) hours as needed for mild pain (pain score 1-3) or moderate pain (pain score 4-6).   apixaban  5 MG Tabs tablet Commonly known as: ELIQUIS  Take 1 tablet (5 mg total) by mouth 2 (two) times daily.   Biofreeze Roll-On 4 % Gel Generic drug: Menthol (Topical Analgesic) Apply 1 Application topically as needed (knee pain).   calcium  citrate 950 (200 Ca) MG tablet Commonly known as: CALCITRATE - dosed in mg elemental calcium  Take 200 mg of elemental calcium  by mouth daily.   carvedilol  12.5 MG tablet Commonly known as: COREG  Take 1 tablet (12.5 mg total) by mouth 2 (two) times daily with a meal. What changed:  medication strength how much to take   ciprofloxacin  500 MG tablet Commonly known as: CIPRO  Take 1 tablet (500 mg total) by mouth 2 (two) times daily for 3 days.   clopidogrel  75 MG tablet Commonly known as: PLAVIX  Take 1 tablet (75 mg total) by mouth daily.   furosemide  40 MG tablet Commonly known as: LASIX  Take 1 tablet (40 mg total) by mouth daily.   isosorbide  mononitrate 30 MG 24 hr tablet Commonly known as: IMDUR  Take 1 tablet (30 mg total) by mouth daily.   levothyroxine  75 MCG tablet Commonly known as: SYNTHROID  Take 75 mcg by  mouth every morning.   losartan  25 MG tablet Commonly known as: COZAAR  Take 1 tablet (25 mg total) by mouth daily. Start taking on: November 26, 2024 What changed: how much to take   nitroGLYCERIN  0.4 MG SL tablet Commonly known as: NITROSTAT  Place 1 tablet (0.4 mg total) under the tongue every 5 (five) minutes as needed for chest pain (3 doses max).   NovoLIN  70/30 Kwikpen (70-30) 100 UNIT/ML KwikPen Generic drug: insulin  isophane & regular human KwikPen Inject 20 Units into the skin in the morning and at bedtime. Take 30 units in the morning and Take 50 units at bedtime What changed:  when to take this additional instructions   rosuvastatin  20 MG tablet Commonly known as: CRESTOR  Take 1 tablet (20 mg total) by mouth daily. Start taking on: November 26, 2024 What changed:  medication strength how much to take when to take this        Contact information for follow-up providers     Clarice Nottingham, MD Follow up on 11/26/2024.   Specialty: Internal Medicine Why: 9:30 am  for follow up with Larraine Cones  in Med Management Contact information: [address] [place] [address] [place] [address] [phone]              Contact information for after-discharge care     Home Medical Care     [facility] [place] [facility]) .   Service: Home Health Services Why: Agency will call you to set up apt times Contact information: [address] [place] [address] [place]  [address] [phone]                    Allergies[1]  Consultations: Cardiology   Procedures/Studies: CARDIAC CATHETERIZATION Result Date: 11/23/2024   Prox LAD to Mid LAD lesion is 30% stenosed.   Mid RCA lesion is 70% stenosed.   Ost RCA lesion is 55% stenosed.   1st Diag lesion is 90% stenosed.   1st Mrg lesion is 70% stenosed.   3rd LPL lesion is 70% stenosed.   Prox LAD lesion is 40% stenosed.   Ost LM lesion is 45% stenosed.   Ost LAD lesion is 30%  stenosed.   LPAV lesion is 100% stenosed.   Previously placed Ost Cx to Prox Cx stent of unknown type is  widely patent.   LV end diastolic pressure is mildly elevated.   There is no aortic valve stenosis.   In the absence of any other complications or medical issues, we expect the patient to be ready for discharge from a cath perspective on 11/24/2024.   Recommend to resume Apixaban , at currently prescribed dose and frequency on 11/24/2024.   Recommend concurrent antiplatelet therapy of Clopidogrel  75mg  daily for 12 months. Conclusions: Severe two-vessel coronary artery disease with occlusion of distal LCx likely representing the culprit lesion for the patient's NSTEMI.  The RPDA is faintly collateralized by left-to-left collaterals.  There is also a 90% stenosis of the jailed diagonal branch, unchanged from prior catheterization.  Moderate disease involving the ostium of the LMCA, ostial/proximal LAD, and nondominant RCA is present as well. Mildly elevated left ventricular filling pressure (LVEDP 20 mmHg). Severe systemic hypertension. Recommendations: Images reviewed with Dr. Elmira.  We recommend aggressive medical therapy and consideration of outpatient myocardial PET/CT to assess hemodynamic significance of LMCA/LAD disease, particularly if the patient has recurrent symptoms. Aggressive secondary prevention of coronary artery disease. Remove right femoral artery sheath in holding with manual compression to achieve hemostasis. Escalate blood pressure control. Initiate heparin  infusion 8 hours after right femoral hemostasis has been achieved.  Consider transitioning to DOAC as soon as tomorrow if there is no evidence of bleeding or vascular injury.  Would favor treatment with DOAC + clopidogrel  for up to 12 months as tolerated. Lonni Hanson, MD [facility]  ECHOCARDIOGRAM COMPLETE Result Date: 11/23/2024    ECHOCARDIOGRAM REPORT   Patient Name:   SHAWNEE GAMBONE [place] Date of Exam: 11/23/2024 Medical Rec #:   [id number]       Height:       64.0 in Accession #:    [id number]      Weight:       180.0 lb Date of Birth:  [birth year]        BSA:          1.871 m Patient Age:    84 years        BP:           181/98 mmHg Patient Gender: F  HR:           77 bpm. Exam Location:  Inpatient Procedure: 2D Echo, Color Doppler, Cardiac Doppler and Intracardiac            Opacification Agent (Both Spectral and Color Flow Doppler were            utilized during procedure). Indications:    NSTEMI I21.4  History:        Patient has prior history of Echocardiogram examinations, most                 recent 09/25/2022. CAD and Previous Myocardial Infarction; Risk                 Factors:Hypertension and Diabetes.  Sonographer:    Tinnie Gosling RDCS Referring Phys: [id number] JUSTIN B HOWERTER IMPRESSIONS  1. Left ventricular ejection fraction, by estimation, is 50 to 55%. The left ventricle has low normal function. The left ventricle demonstrates regional wall motion abnormalities (see scoring diagram/findings for description). Left ventricular diastolic  parameters are indeterminate.  2. Right ventricular systolic function is low normal. The right ventricular size is normal.  3. Left atrial size was mildly dilated.  4. A small pericardial effusion is present.  5. The mitral valve is degenerative. Trivial mitral valve regurgitation.  6. The aortic valve is tricuspid. There is mild calcification of the aortic valve. Aortic valve regurgitation is not visualized. No aortic stenosis is present.  7. The inferior vena cava is dilated in size with <50% respiratory variability, suggesting right atrial pressure of 15 mmHg. Comparison(s): Changes from prior study are noted. Conclusion(s)/Recommendation(s): EF is overall low normal, but there are focal wall motion abnormalities with hypokinesis to near akinesis in septum and inferior walls as noted. FINDINGS  Left Ventricle: Left ventricular ejection fraction, by estimation, is 50 to 55%. The  left ventricle has low normal function. The left ventricle demonstrates regional wall motion abnormalities. Definity  contrast agent was given IV to delineate the left ventricular endocardial borders. The left ventricular internal cavity size was normal in size. There is no left ventricular hypertrophy. Left ventricular diastolic parameters are indeterminate.  LV Wall Scoring: The entire septum and entire inferior wall are hypokinetic. The mid and distal anterior wall, mid and distal lateral wall, posterior wall, mid anterolateral segment, and apex are normal. Right Ventricle: The right ventricular size is normal. Right vetricular wall thickness was not well visualized. Right ventricular systolic function is low normal. Left Atrium: Left atrial size was mildly dilated. Right Atrium: Right atrial size was not well visualized. Pericardium: A small pericardial effusion is present. Mitral Valve: The mitral valve is degenerative in appearance. There is mild thickening of the mitral valve leaflet(s). There is mild calcification of the mitral valve leaflet(s). Mild to moderate mitral annular calcification. Trivial mitral valve regurgitation. Tricuspid Valve: The tricuspid valve is not well visualized. Tricuspid valve regurgitation is trivial. No evidence of tricuspid stenosis. Aortic Valve: The aortic valve is tricuspid. There is mild calcification of the aortic valve. Aortic valve regurgitation is not visualized. No aortic stenosis is present. Pulmonic Valve: The pulmonic valve was not well visualized. Pulmonic valve regurgitation is trivial. No evidence of pulmonic stenosis. Aorta: The aortic root and ascending aorta are structurally normal, with no evidence of dilitation. Venous: The inferior vena cava is dilated in size with less than 50% respiratory variability, suggesting right atrial pressure of 15 mmHg. IAS/Shunts: The interatrial septum was not well visualized.  LEFT VENTRICLE PLAX 2D LVIDd:  5.20 cm LVIDs:          3.40 cm LV PW:         1.00 cm LV IVS:        1.00 cm LVOT diam:     2.00 cm LV SV:         49 LV SV Index:   26 LVOT Area:     3.14 cm  LV Volumes (MOD) LV vol d, MOD A2C: 78.2 ml LV vol d, MOD A4C: 96.1 ml LV vol s, MOD A2C: 34.8 ml LV vol s, MOD A4C: 52.7 ml LV SV MOD A2C:     43.4 ml LV SV MOD A4C:     96.1 ml LV SV MOD BP:      42.3 ml RIGHT VENTRICLE            IVC RV S prime:     9.95 cm/s  IVC diam: 2.20 cm TAPSE (M-mode): 1.6 cm LEFT ATRIUM             Index LA Vol (A2C):   63.9 ml 34.16 ml/m LA Vol (A4C):   52.1 ml 27.85 ml/m LA Biplane Vol: 59.8 ml 31.97 ml/m  AORTIC VALVE LVOT Vmax:   78.30 cm/s LVOT Vmean:  54.200 cm/s LVOT VTI:    0.156 m  AORTA Ao Root diam: 2.60 cm Ao Asc diam:  2.80 cm MITRAL VALVE MV Area (PHT): 3.46 cm     SHUNTS MV Decel Time: 219 msec     Systemic VTI:  0.16 m MV E velocity: 132.00 cm/s  Systemic Diam: 2.00 cm Shelda Bruckner MD Electronically signed by Shelda Bruckner MD Signature Date/Time: 11/23/2024/2:19:04 PM    Final    CT Angio Chest/Abd/Pel for Dissection W and/or Wo Contrast Result Date: 11/23/2024 EXAM: CTA CHEST, ABDOMEN AND PELVIS WITH AND WITHOUT CONTRAST 11/23/2024 12:57:00 AM TECHNIQUE: CTA of the chest was performed with and without the administration of 100 mL of intravenous iohexol  (OMNIPAQUE ) 350 MG/ML injection. CTA of the abdomen and pelvis was performed with and without the administration of 100 mL of intravenous iohexol  (OMNIPAQUE ) 350 MG/ML injection. Multiplanar reformatted images are provided for review. MIP images are provided for review. Automated exposure control, iterative reconstruction, and/or weight based adjustment of the mA/kV was utilized to reduce the radiation dose to as low as reasonably achievable. COMPARISON: Prior study dated 08/XX/2023. CLINICAL HISTORY: Acute aortic syndrome (AAS). Chest pain. FINDINGS: VASCULATURE: AORTA: Extensive streak artifact related to contrast within the superior vena cava results in  artifactual filling defects within the ascending thoracic aorta. No intramural hematoma, dissection, or thoracic aortic aneurysm. Moderate atherosclerotic calcification within the thoracic aorta. No abdominal aortic aneurysm. PULMONARY ARTERIES: Extensive streak artifact related to contrast within the superior vena cava results in artifactual filling defects within the central pulmonary arterial tree. No definite pulmonary embolism. GREAT VESSELS OF AORTIC ARCH: No acute finding. No dissection. No arterial occlusion or significant stenosis. CELIAC TRUNK: No acute finding. No occlusion or significant stenosis. SUPERIOR MESENTERIC ARTERY: No acute finding. No occlusion or significant stenosis. INFERIOR MESENTERIC ARTERY: Greater than 75% stenosis of the inferior mesenteric artery at its origin. This is of questionable clinical significance given the wide patency of the remaining mesenteric vasculature. RENAL ARTERIES: Greater than 75% stenosis of the right renal artery at its origin secondary to calcified atherosclerotic plaque. Less than 50% stenosis of the left renal artery at its origin. Both renal arteries demonstrate normal vascular morphology. No aneurysm or dissection. ILIAC ARTERIES: No  acute finding. No occlusion or significant stenosis. CHEST: MEDIASTINUM: Extensive multivessel coronary artery calcifications. Mild cardiomegaly. No mediastinal lymphadenopathy. The heart and pericardium demonstrate no acute abnormality. LUNGS AND PLEURA: The lungs are without acute process. No focal consolidation or pulmonary edema. No evidence of pleural effusion or pneumothorax. THORACIC BONES AND SOFT TISSUES: No acute bone or soft tissue abnormality. ABDOMEN AND PELVIS: LIVER: The liver is unremarkable. GALLBLADDER AND BILE DUCTS: Gallbladder is unremarkable. No biliary ductal dilatation. SPLEEN: The spleen is unremarkable. PANCREAS: The pancreas is unremarkable. ADRENAL GLANDS: 10 mm nodule within the left adrenal gland  is compatible with a benign adrenal adenoma for which no follow up imaging is recommended. KIDNEYS, URETERS AND BLADDER: No stones in the kidneys or ureters. No hydronephrosis. No perinephric or periureteral stranding. Moderate volume nondependent gas is seen within the bladder lumen which is nonspecific and can be seen with recent catheterization though aggressive infection or interfascicular fistula could results in similar appearance. Notably, there is effacement of the intervening fat plane between the bladder and several diverticula of the sigmoid colon involving the dome (series 263, image 6; series 136, image 10). Correlation with urinalysis and urine culture may be helpful for confirmation. GI AND BOWEL: Small hiatal hernia. There is severe descending and sigmoid colonic diverticulosis without superimposed acute inflammatory change identified. The stomach, small bowel, and large bowel are otherwise unremarkable. Appendix normal. Small fat-containing umbilical hernia. There is no bowel obstruction. No abnormal bowel wall thickening or distension. REPRODUCTIVE: Uterus absent. No adnexal masses. PERITONEUM AND RETROPERITONEUM: No ascites or free air. LYMPH NODES: No lymphadenopathy. ABDOMINAL BONES AND SOFT TISSUES: Status post left hip ORIF. Moderate-to-severe bilateral degenerative hip arthritis, asymmetrically more severe on the left. Osseous structures are otherwise age appropriate. No acute bone abnormality. No lytic or blastic bone lesion. No acute soft tissue abnormality. IMPRESSION: 1. No evidence of acute aortic syndrome. 2. No definite pulmonary embolism, limited by streak artifact. 3. Extensive multivessel coronary artery calcifications and mild cardiomegaly. 4. Greater than 75% stenosis of the right renal artery origin due to calcified atherosclerotic plaque. Correlation for clinical evidence of hemodynamically significant renal artery stenosis is recommended. 5. Greater than 75% stenosis of the  inferior mesenteric artery origin, unlikely of clinical significance given wide patency of the remaining mesenteric vasculature. . 6. Moderate nondependent intraluminal bladder gas with effacement of the fat plane between the bladder dome and adjacent sigmoid diverticula. While this is most commonly seen in the setting of recent bladder catheterization, aggressive infection or potentially choledochal fistula could result in a similar appearance. Correlation with urinalysis and urine culture would be helpful for further evaluation. 7. Severe descending and sigmoid colonic diverticulosis without acute inflammatory change. 8. Small hiatal hernia. 9. 10 mm left adrenal adenoma, with no follow-up imaging recommended. 10. Raf score includes aortic atherosclerosis (ICD10-I70.0). Electronically signed by: Dorethia Molt MD MD 11/23/2024 01:23 AM EST RP Workstation: [id number]   DG Chest Portable 1 View Result Date: 11/22/2024 EXAM: 1 VIEW(S) XRAY OF THE CHEST 11/22/2024 11:30:00 PM COMPARISON: None available. CLINICAL HISTORY: FINDINGS: LINES, TUBES AND DEVICES: EKG leads noted. LUNGS AND PLEURA: No focal pulmonary opacity. No pleural effusion. No pneumothorax. HEART AND MEDIASTINUM: Mild cardiomegaly. Aortic atherosclerosis. BONES AND SOFT TISSUES: No acute osseous abnormality. IMPRESSION: 1. No acute findings. 2. Mild cardiomegaly and aortic atherosclerosis. Electronically signed by: Dorethia Molt MD MD 11/22/2024 11:34 PM EST RP Workstation: [id number]      Subjective: Patient seen and examined at bedside today.  Hemodynamically stable.  On  normal sinus rhythm this morning.  No new complaints today.  Appears comfortable.  History to go home.  Discussed with husband about discharge planning at bedside.  Discharge Exam: Vitals:   11/25/24 0008 11/25/24 0530  BP: (!) 129/55 124/65  Pulse: 76 74  Resp: 20 17  Temp: 98.5 F (36.9 C) 98.4 F (36.9 C)  SpO2: 91% 93%   Vitals:   11/24/24 1620 11/24/24 1949  11/25/24 0008 11/25/24 0530  BP: (!) 154/56 (!) 127/59 (!) 129/55 124/65  Pulse: 74 74 76 74  Resp: (!) 22 20 20 17   Temp:  98.9 F (37.2 C) 98.5 F (36.9 C) 98.4 F (36.9 C)  TempSrc:  Oral Oral Oral  SpO2: 95% 94% 91% 93%  Weight:   85.7 kg   Height:        General: Pt is alert, awake, not in acute distress, pleasant female Cardiovascular: RRR, S1/S2 +, no rubs, no gallops Respiratory: CTA bilaterally, no wheezing, no rhonchi Abdominal: Soft, NT, ND, bowel sounds + Extremities: no edema, no cyanosis    The results of significant diagnostics from this hospitalization (including imaging, microbiology, ancillary and laboratory) are listed below for reference.     Microbiology: Recent Results (from the past 240 hours)  Urine Culture (for pregnant, neutropenic or urologic patients or patients with an indwelling urinary catheter)     Status: Abnormal   Collection Time: 11/23/24 12:18 PM   Specimen: Urine, Clean Catch  Result Value Ref Range Status   Specimen Description URINE, CLEAN CATCH  Final   Special Requests   Final    NONE Performed at [facility], [address] [place], [place] [address]    Culture >=100,000 COLONIES/mL RAOULTELLA PLANTICOLA (A)  Final   Report Status 11/25/2024 FINAL  Final   Organism ID, Bacteria RAOULTELLA PLANTICOLA (A)  Final      Susceptibility   Raoultella planticola - MIC*    AMPICILLIN >=32 RESISTANT Resistant     CEFEPIME  <=0.12 SENSITIVE Sensitive     ERTAPENEM <=0.12 SENSITIVE Sensitive     CEFTRIAXONE  <=0.25 SENSITIVE Sensitive     CIPROFLOXACIN  0.12 SENSITIVE Sensitive     GENTAMICIN <=1 SENSITIVE Sensitive     NITROFURANTOIN  32 SENSITIVE Sensitive     TRIMETH/SULFA <=20 SENSITIVE Sensitive     AMPICILLIN/SULBACTAM 8 SENSITIVE Sensitive     PIP/TAZO Value in next row Sensitive      16 SENSITIVEThis is a modified FDA-approved test that has been validated and its performance characteristics determined by the reporting  laboratory.  This laboratory is certified under the Clinical Laboratory Improvement Amendments CLIA as qualified to perform high complexity clinical laboratory testing.    MEROPENEM Value in next row Sensitive      16 SENSITIVEThis is a modified FDA-approved test that has been validated and its performance characteristics determined by the reporting laboratory.  This laboratory is certified under the Clinical Laboratory Improvement Amendments CLIA as qualified to perform high complexity clinical laboratory testing.    * >=100,000 COLONIES/mL RAOULTELLA PLANTICOLA  Urine Culture (for pregnant, neutropenic or urologic patients or patients with an indwelling urinary catheter)     Status: Abnormal   Collection Time: 11/24/24  8:10 AM   Specimen: Urine, Clean Catch  Result Value Ref Range Status   Specimen Description URINE, CLEAN CATCH  Final   Special Requests   Final    NONE Performed at [facility], [address] [place], [place] [address]    Culture  MULTIPLE SPECIES PRESENT, SUGGEST RECOLLECTION (A)  Final   Report Status 11/25/2024 FINAL  Final     Labs: BNP (last 3 results) No results for input(s): BNP in the last 8760 hours. Basic Metabolic Panel: Recent Labs  Lab 11/22/24 2313 11/22/24 2321 11/23/24 0354 11/24/24 1039 11/25/24 0303  NA 135 138 135 135 132*  K 4.5 4.3 4.7 4.1 3.9  CL 102 106 103 99 98  CO2 20*  --  21* 23 22  GLUCOSE 281* 275* 288* 184* 114*  BUN 26* 28* 24* 24* 32*  CREATININE 1.15* 1.20* 1.08* 1.17* 1.41*  CALCIUM  9.8  --  9.2 9.3 8.6*  MG  --   --  2.0  --   --    Liver Function Tests: Recent Labs  Lab 11/22/24 2313 11/23/24 0354  AST 18 39  ALT 8 10  ALKPHOS 81 73  BILITOT 0.4 0.5  PROT 7.8 6.9  ALBUMIN 4.2 3.8   Recent Labs  Lab 11/22/24 2313  LIPASE 23   No results for input(s): AMMONIA in the last 168 hours. CBC: Recent Labs  Lab 11/22/24 2313 11/22/24 2321 11/23/24 0354 11/24/24 1039 11/25/24 0303  WBC 8.8  --   9.9 12.2* 10.3  NEUTROABS 6.4  --  8.1*  --   --   HGB 13.1 13.3 11.2* 12.3 11.5*  HCT 42.3 39.0 34.5* 37.3 35.2*  MCV 89.1  --  86.5 84.4 84.6  PLT 324  --  275 296 283   Cardiac Enzymes: No results for input(s): CKTOTAL, CKMB, CKMBINDEX, TROPONINI in the last 168 hours. BNP: Invalid input(s): POCBNP CBG: Recent Labs  Lab 11/24/24 0853 11/24/24 1104 11/24/24 1616 11/24/24 2129 11/25/24 0545  GLUCAP 178* 198* 184* 166* 123*   D-Dimer No results for input(s): DDIMER in the last 72 hours. Hgb A1c Recent Labs    11/23/24 0354  HGBA1C 9.7*   Lipid Profile No results for input(s): CHOL, HDL, LDLCALC, TRIG, CHOLHDL, LDLDIRECT in the last 72 hours. Thyroid  function studies Recent Labs    11/23/24 0354  TSH 0.641   Anemia work up No results for input(s): VITAMINB12, FOLATE, FERRITIN, TIBC, IRON , RETICCTPCT in the last 72 hours. Urinalysis    Component Value Date/Time   COLORURINE YELLOW 11/23/2024 1218   APPEARANCEUR HAZY (A) 11/23/2024 1218   LABSPEC 1.019 11/23/2024 1218   PHURINE 5.0 11/23/2024 1218   GLUCOSEU 150 (A) 11/23/2024 1218   HGBUR MODERATE (A) 11/23/2024 1218   HGBUR large 10/10/2010 1014   BILIRUBINUR NEGATIVE 11/23/2024 1218   KETONESUR NEGATIVE 11/23/2024 1218   PROTEINUR 100 (A) 11/23/2024 1218   UROBILINOGEN 0.2 10/10/2010 1014   NITRITE NEGATIVE 11/23/2024 1218   LEUKOCYTESUR MODERATE (A) 11/23/2024 1218   Sepsis Labs Recent Labs  Lab 11/22/24 2313 11/23/24 0354 11/24/24 1039 11/25/24 0303  WBC 8.8 9.9 12.2* 10.3   Microbiology Recent Results (from the past 240 hours)  Urine Culture (for pregnant, neutropenic or urologic patients or patients with an indwelling urinary catheter)     Status: Abnormal   Collection Time: 11/23/24 12:18 PM   Specimen: Urine, Clean Catch  Result Value Ref Range Status   Specimen Description URINE, CLEAN CATCH  Final   Special Requests   Final    NONE Performed at [facility], [address] [place], [place] [address]    Culture >=100,000 COLONIES/mL RAOULTELLA PLANTICOLA (A)  Final   Report Status 11/25/2024 FINAL  Final   Organism ID, Bacteria RAOULTELLA PLANTICOLA (  A)  Final      Susceptibility   Raoultella planticola - MIC*    AMPICILLIN >=32 RESISTANT Resistant     CEFEPIME  <=0.12 SENSITIVE Sensitive     ERTAPENEM <=0.12 SENSITIVE Sensitive     CEFTRIAXONE  <=0.25 SENSITIVE Sensitive     CIPROFLOXACIN  0.12 SENSITIVE Sensitive     GENTAMICIN <=1 SENSITIVE Sensitive     NITROFURANTOIN  32 SENSITIVE Sensitive     TRIMETH/SULFA <=20 SENSITIVE Sensitive     AMPICILLIN/SULBACTAM 8 SENSITIVE Sensitive     PIP/TAZO Value in next row Sensitive      16 SENSITIVEThis is a modified FDA-approved test that has been validated and its performance characteristics determined by the reporting laboratory.  This laboratory is certified under the Clinical Laboratory Improvement Amendments CLIA as qualified to perform high complexity clinical laboratory testing.    MEROPENEM Value in next row Sensitive      16 SENSITIVEThis is a modified FDA-approved test that has been validated and its performance characteristics determined by the reporting laboratory.  This laboratory is certified under the Clinical Laboratory Improvement Amendments CLIA as qualified to perform high complexity clinical laboratory testing.    * >=100,000 COLONIES/mL RAOULTELLA PLANTICOLA  Urine Culture (for pregnant, neutropenic or urologic patients or patients with an indwelling urinary catheter)     Status: Abnormal   Collection Time: 11/24/24  8:10 AM   Specimen: Urine, Clean Catch  Result Value Ref Range Status   Specimen Description URINE, CLEAN CATCH  Final   Special Requests   Final    NONE Performed at [facility], [address] [place], [place] [address]    Culture MULTIPLE SPECIES PRESENT, SUGGEST RECOLLECTION (A)  Final   Report Status 11/25/2024 FINAL  Final    Please  note: You were cared for by a hospitalist during your hospital stay. Once you are discharged, your primary care physician will handle any further medical issues. Please note that NO REFILLS for any discharge medications will be authorized once you are discharged, as it is imperative that you return to your primary care physician (or establish a relationship with a primary care physician if you do not have one) for your post hospital discharge needs so that they can reassess your need for medications and monitor your lab values.    Time coordinating discharge: 40 minutes  SIGNED:   Ivonne Mustache, MD  [facility] 11/25/2024, 10:38 AM Pager [phone]  If 7PM-7AM, please contact night-coverage [email or web address] Password [email or web address]    [1]  Allergies Allergen Reactions   Penicillins Anaphylaxis    Diffuse joint pain @ age 57 in context of Scarlet Fever  Did it involve swelling of the face/tongue/throat, SOB, or low BP? No  Did it involve sudden or severe rash/hives, skin peeling, or any reaction on the inside of your mouth or nose? No  Did you need to seek medical attention at a hospital or doctor's office? No  When did it last happen?  childhood      If all above answers are NO, may proceed with cephalosporin use.

## 2024-11-25 NOTE — Progress Notes (Addendum)
 "   Patient Name: Danielle Murray Date of Encounter: 11/25/2024 [facility] Cardiologist: Maude Emmer, MD   Interval Summary  .    No chest pain, shortness of breath. Blood pressure improving. Mild increase in creatinine and decrease in sodium.  Vital Signs .    Vitals:   11/24/24 1620 11/24/24 1949 11/25/24 0008 11/25/24 0530  BP: (!) 154/56 (!) 127/59 (!) 129/55 124/65  Pulse: 74 74 76 74  Resp: (!) 22 20 20 17   Temp:  98.9 F (37.2 C) 98.5 F (36.9 C) 98.4 F (36.9 C)  TempSrc:  Oral Oral Oral  SpO2: 95% 94% 91% 93%  Weight:   85.7 kg   Height:        Intake/Output Summary (Last 24 hours) at 11/25/2024 0936 Last data filed at 11/25/2024 0500 Gross per 24 hour  Intake 678 ml  Output 450 ml  Net 228 ml      11/25/2024   12:08 AM 11/24/2024    6:14 AM 11/23/2024    6:25 PM  Last 3 Weights  Weight (lbs) 188 lb 15 oz 187 lb 187 lb 9.8 oz  Weight (kg) 85.7 kg 84.823 kg 85.1 kg      Telemetry/ECG    11/24/2024 - Personally Reviewed No significant arrhythmia  Echocardiogram 11/23/2024:  1. Left ventricular ejection fraction, by estimation, is 50 to 55%. The  left ventricle has low normal function. The left ventricle demonstrates  regional wall motion abnormalities (see scoring diagram/findings for  description). Left ventricular diastolic   parameters are indeterminate.   2. Right ventricular systolic function is low normal. The right  ventricular size is normal.   3. Left atrial size was mildly dilated.   4. A small pericardial effusion is present.   5. The mitral valve is degenerative. Trivial mitral valve regurgitation.   6. The aortic valve is tricuspid. There is mild calcification of the  aortic valve. Aortic valve regurgitation is not visualized. No aortic  stenosis is present.   7. The inferior vena cava is dilated in size with <50% respiratory  variability, suggesting right atrial pressure of 15 mmHg.   Comparison(s): Changes from prior study  are noted.   Conclusion(s)/Recommendation(s): EF is overall low normal, but there are  focal wall motion abnormalities with hypokinesis to near akinesis in  septum and inferior walls as noted.   Cardiac catheterization 11/23/2024:   Prox LAD to Mid LAD lesion is 30% stenosed.   Mid RCA lesion is 70% stenosed.   Ost RCA lesion is 55% stenosed.   1st Diag lesion is 90% stenosed.   1st Mrg lesion is 70% stenosed.   3rd LPL lesion is 70% stenosed.   Prox LAD lesion is 40% stenosed.   Ost LM lesion is 45% stenosed.   Ost LAD lesion is 30% stenosed.   LPAV lesion is 100% stenosed.   Previously placed Ost Cx to Prox Cx stent of unknown type is  widely patent.   LV end diastolic pressure is mildly elevated.   There is no aortic valve stenosis.   In the absence of any other complications or medical issues, we expect the patient to be ready for discharge from a cath perspective on 11/24/2024.   Recommend to resume Apixaban , at currently prescribed dose and frequency on 11/24/2024.   Recommend concurrent antiplatelet therapy of Clopidogrel  75mg  daily for 12 months.   Conclusions: Severe two-vessel coronary artery disease with occlusion of distal LCx likely representing the culprit lesion for  the patient's NSTEMI.  The RPDA is faintly collateralized by left-to-left collaterals.  There is also a 90% stenosis of the jailed diagonal branch, unchanged from prior catheterization.  Moderate disease involving the ostium of the LMCA, ostial/proximal LAD, and nondominant RCA is present as well. Mildly elevated left ventricular filling pressure (LVEDP 20 mmHg). Severe systemic hypertension.   Recommendations: Images reviewed with Dr. Elmira.  We recommend aggressive medical therapy and consideration of outpatient myocardial PET/CT to assess hemodynamic significance of LMCA/LAD disease, particularly if the patient has recurrent symptoms. Aggressive secondary prevention of coronary artery disease. Remove  right femoral artery sheath in holding with manual compression to achieve hemostasis. Escalate blood pressure control. Initiate heparin  infusion 8 hours after right femoral hemostasis has been achieved.  Consider transitioning to DOAC as soon as tomorrow if there is no evidence of bleeding or vascular injury.  Would favor treatment with DOAC + clopidogrel  for up to 12 months as tolerated.    Physical Exam .   Physical Exam Vitals and nursing note reviewed.  Constitutional:      General: She is not in acute distress. Neck:     Vascular: No JVD.  Cardiovascular:     Rate and Rhythm: Normal rate and regular rhythm.     Heart sounds: Normal heart sounds. No murmur heard. Pulmonary:     Effort: Pulmonary effort is normal.     Breath sounds: Normal breath sounds. No wheezing or rales.  Musculoskeletal:     Right lower leg: No edema.     Left lower leg: No edema.      Assessment & Plan .     84 y.o. female w/hypertension, hyperlipidemia, type 2 DM, CAD w/prior PCIs, ischemic cardiomyopathy with improved LVEF, admitted with chest pain, NSTEMI, new diagnosis Afib, Rt renal artery stenosis   NSTEMI: Moderate to severe multivessel CAD, with culprit likely being occluded LPDA, with possibly completed infarct. Residual disease in LM/prox LAD before prior stent, at least moderate, along with disease in nondominant small RCA. Recommend aggressive medical management with outpatient PET/CT stress test to evaluate for LM/LAD ischemia. Not a great candidate for CABG, also tricky anatomy for LM PCI given short left main circumflex. Recommend medical management for now. Recommend Plavix , along with DOAC given new Afib. Okay to skip Aspirin . Continue Coreg , Imdur , statin.  Atrial fibrillation: New finding, rate controlled. Will need outpatient management. Continue Eliquis  5 mg bid.   Hypertension: Improving. Currently on Coreg  12.5 mg twice daily, losartan  12.5 mg daily,  Imdur  30 mg  daily. With EF of 50%, mild hyponatremia and AKI, I will hold off spironolactone  for now. 75% right renal artery stenosis, could also be contributing. If blood pressure remains uncontrolled, could consider outpatient workup and management for renal artery stenosis.   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 Bladder abnormalities could related to recent UTI, defer management to primary team.  Abnormal urinalysis: Defer management to primary team.   Type 2 diabetes mellitus: Uncontrolled, A1c 9.7%. Goal A1c <7%.  Defer management to primary team.  Cardiology will sign off and arrange outpatient follow-up. Will check BMP in 1 week.  For questions or updates, please contact [facility] Please consult [email or web address] for contact info under        Signed, Newman JINNY Elmira, MD   "

## 2024-11-26 ENCOUNTER — Telehealth: Payer: Self-pay

## 2024-11-26 NOTE — Transitions of Care (Post Inpatient/ED Visit) (Signed)
" ° °  11/26/2024  Name: Danielle Murray MRN: [id number] DOB: [birth year]  Today's TOC FU Call Status: Today's TOC FU Call Status:: Unsuccessful Call (1st Attempt) Unsuccessful Call (1st Attempt) Date: 11/26/24  Attempted to reach the patient regarding the most recent Inpatient/ED visit.  Follow Up Plan: Additional outreach attempts will be made to reach the patient to complete the Transitions of Care (Post Inpatient/ED visit) call.   Nansi Birmingham J. Jdyn Parkerson RN, MSN [facility], [facility] RN Care Manager Direct Dial: [phone]  Fax: [phone] Website: [email or web address]   "

## 2024-11-27 ENCOUNTER — Telehealth: Payer: Self-pay

## 2024-11-27 NOTE — Transitions of Care (Post Inpatient/ED Visit) (Signed)
 "  11/27/2024  Name: Danielle Murray MRN: [id number] DOB: 1941/06/19  Today's TOC FU Call Status: Today's TOC FU Call Status:: Successful TOC FU Call Completed TOC FU Call Complete Date: 11/27/24  Patient's Name and Date of Birth confirmed. Name, DOB  Transition Care Management Follow-up Telephone Call Date of Discharge: 11/25/24 Discharge Facility: [facility]) Type of Discharge: Inpatient Admission Primary Inpatient Discharge Diagnosis:: Non-ST elevated myocardial infarction How have you been since you were released from the hospital?: Better Any questions or concerns?: No  Items Reviewed: Did you receive and understand the discharge instructions provided?: Yes Medications obtained,verified, and reconciled?: Yes (Medications Reviewed) Dietary orders reviewed?: Yes Type of Diet Ordered:: Heart Healthy carb modified Do you have support at home?: Yes People in Home [RPT]: spouse Name of Support/Comfort Primary Source: Debby  Medications Reviewed Today: Medications Reviewed Today     Reviewed by Pegi Milazzo, RN (Case Manager) on 11/27/24 at 1226  Med List Status: <None>   Medication Order Taking? Sig Documenting Provider Last Dose Status Informant  acetaminophen  (TYLENOL ) 500 MG tablet [id number] Yes Take 1,000 mg by mouth every 6 (six) hours as needed for mild pain (pain score 1-3) or moderate pain (pain score 4-6). [provider]  Active Self, Spouse/Significant Other, Child, Pharmacy Records  apixaban  (ELIQUIS ) 5 MG TABS tablet [id number] Yes Take 1 tablet (5 mg total) by mouth 2 (two) times daily. Jillian Buttery, MD  Active   calcium  citrate (CALCITRATE - DOSED IN MG ELEMENTAL CALCIUM ) 950 (200 Ca) MG tablet [id number] Yes Take 200 mg of elemental calcium  by mouth daily. [provider]  Active Self, Spouse/Significant Other, Child, Pharmacy Records  carvedilol  (COREG ) 12.5 MG tablet [id number] Yes Take 1 tablet (12.5 mg total) by mouth 2 (two) times daily  with a meal. Jillian Buttery, MD  Active   ciprofloxacin  (CIPRO ) 500 MG tablet [id number] Yes Take 1 tablet (500 mg total) by mouth 2 (two) times daily for 3 days. Jillian Buttery, MD  Active   clopidogrel  (PLAVIX ) 75 MG tablet [id number] Yes Take 1 tablet (75 mg total) by mouth daily. Delford Maude BROCKS, MD  Active Self, Spouse/Significant Other, Child, Pharmacy Records  furosemide  (LASIX ) 40 MG tablet [id number] Yes Take 1 tablet (40 mg total) by mouth daily. Nishan, Peter C, MD  Active Self, Spouse/Significant Other, Child, Pharmacy Records  isosorbide  mononitrate (IMDUR ) 30 MG 24 hr tablet [id number] Yes Take 1 tablet (30 mg total) by mouth daily. Nishan, Peter C, MD  Active Self, Spouse/Significant Other, Child, Pharmacy Records  levothyroxine  (SYNTHROID ) 75 MCG tablet [id number] Yes Take 75 mcg by mouth every morning. [provider]  Active Self, Spouse/Significant Other, Child, Pharmacy Records  losartan  (COZAAR ) 25 MG tablet [id number] Yes Take 1 tablet (25 mg total) by mouth daily. Jillian Buttery, MD  Active   Menthol, Topical Analgesic, (BIOFREEZE ROLL-ON) 4 % GEL [id number] Yes Apply 1 Application topically as needed (knee pain). [provider]  Active Self, Spouse/Significant Other, Child, Pharmacy Records  nitroGLYCERIN  (NITROSTAT ) 0.4 MG SL tablet [id number] Yes Place 1 tablet (0.4 mg total) under the tongue every 5 (five) minutes as needed for chest pain (3 doses max). Nishan, Peter C, MD  Active Self, Spouse/Significant Other, Child, Pharmacy Records           Med Note EVERLYN, MINDY L   Tue Jul 10, 2022 10:00 AM)    NOVOLIN  70/30 KWIKPEN (70-30) 100 UNIT/ML KwikPen [id number]  Inject 20 Units into the skin in the morning and  at bedtime. Take 30 units in the morning and Take 50 units at bedtime  Patient taking differently: Inject 20 Units into the skin 3 (three) times daily before meals. Take 30 units in the morning   Fairy Frames, MD  Active Self, Spouse/Significant Other,  Child, Pharmacy Records  rosuvastatin  (CRESTOR ) 20 MG tablet [id number] Yes Take 1 tablet (20 mg total) by mouth daily. Jillian Buttery, MD  Active             Home Care and Equipment/Supplies: Were Home Health Services Ordered?: Yes Name of Home Health Agency:: [facility] Has Agency set up a time to come to your home?: Yes First Home Health Visit Date: 11/27/24 Any new equipment or medical supplies ordered?: NA  Functional Questionnaire: Do you need assistance with bathing/showering or dressing?: No Do you need assistance with meal preparation?: No Do you need assistance with eating?: No Do you have difficulty maintaining continence: No Do you need assistance with getting out of bed/getting out of a chair/moving?: No Do you have difficulty managing or taking your medications?: No  Follow up appointments reviewed: PCP Follow-up appointment confirmed?: Yes Date of PCP follow-up appointment?: 11/26/24 Follow-up Provider: Dr. Clarice Specialist [place] Follow-up appointment confirmed?: No Reason Specialist Follow-Up Not Confirmed: Patient has Specialist Provider Number and will Call for Appointment Do you need transportation to your follow-up appointment?: No Do you understand care options if your condition(s) worsen?: Yes-patient verbalized understanding  SDOH Interventions Today    Flowsheet Row Most Recent Value  SDOH Interventions   Food Insecurity Interventions Intervention Not Indicated  Housing Interventions Intervention Not Indicated  Transportation Interventions Intervention Not Indicated  Utilities Interventions Intervention Not Indicated    Samani Deal J. Abbigaile Rockman RN, MSN [facility]  [facility], [facility] RN Care Manager Direct Dial: [phone]  Fax: [phone] Website: [email or web address]   "

## 2024-11-27 NOTE — Patient Instructions (Signed)
 Visit Information  Thank you for taking time to visit with me today. Please don't hesitate to contact me if I can be of assistance to you before our next scheduled telephone appointment.  Our next appointment is by telephone on 12/03/24 at 1000  Following is a copy of your care plan:   Goals Addressed             This Visit's Progress    VBCI Transitions of Care (TOC) Care Plan       Problems:  Recent Hospitalization for treatment of NSTEMI Knowledge Deficit Related to NSTEMI  Goal:  Over the next 30 days, the patient will not experience hospital readmission  Interventions:  Transitions of Care: Doctor Visits  - discussed the importance of doctor visits Communication with PCP re: Enrollment in 30 day [facility] program Reviewed discharge instruction. Reviewed diet, advised adequate rest, start with home health, take medications as prescribed.  Patient Self Care Activities:  Attend all scheduled provider appointments Call pharmacy for medication refills 3-7 days in advance of running out of medications Call provider office for new concerns or questions  Notify RN Care Manager of TOC call rescheduling needs Participate in Transition of Care Program/Attend TOC scheduled calls Take medications as prescribed   Call Cardiology for follow up appointment Return immediately (dial 911) if you experience:  Chest pain, pressure, or squeezing--especially radiating to jaw, neck, arm, back, or stomach Shortness of breath, lightheadedness, nausea, sweating associated with chest pain  Call your provider if you notice:  Confusion, reduced urination, increased swelling in feet/ankles New or worsening bleeding or bruising from medication use   Plan:  The patient has been provided with contact information for the care management team and has been advised to call with any health related questions or concerns.         Patient verbalizes understanding of instructions and care plan provided today  and agrees to view in MyChart. Active MyChart status and patient understanding of how to access instructions and care plan via MyChart confirmed with patient.     The patient has been provided with contact information for the care management team and has been advised to call with any health related questions or concerns.   Please call the care guide team at [phone] if you need to cancel or reschedule your appointment.   Please call the Suicide and Crisis Lifeline: 988 call the [place]  National Suicide Prevention Lifeline: 312-113-4960 or TTY: (603)201-7199 TTY 213-072-1045) to talk to a trained counselor if you are experiencing a Mental Health or Behavioral Health Crisis or need someone to talk to.  Kaydenn Mclear J. Lucina Betty RN, MSN [facility], [facility] RN Care Manager Direct Dial: [phone]  Fax: [phone] Website: [email or web address]

## 2024-12-03 ENCOUNTER — Other Ambulatory Visit: Payer: Self-pay

## 2024-12-03 NOTE — Patient Instructions (Signed)
 Visit Information  Thank you for taking time to visit with me today. Please don't hesitate to contact me if I can be of assistance to you before our next scheduled telephone appointment.  Our next appointment is by telephone on 12/10/24 at 1000 am  Following is a copy of your care plan:   Goals Addressed             This Visit's Progress    VBCI Transitions of Care (TOC) Care Plan       Problems:  Recent Hospitalization for treatment of NSTEMI Knowledge Deficit Related to NSTEMI  Goal:  Over the next 30 days, the patient will not experience hospital readmission  Interventions:  Transitions of Care: Doctor Visits  - discussed the importance of doctor visits Reiterated diet, advised adequate rest, start with home health 1/23, take medications as prescribed. Reviewed upcoming appointment with cardiology 2/18  Patient Self Care Activities:  Attend all scheduled provider appointments Call pharmacy for medication refills 3-7 days in advance of running out of medications Call provider office for new concerns or questions  Notify RN Care Manager of TOC call rescheduling needs Participate in Transition of Care Program/Attend TOC scheduled calls Take medications as prescribed   Call Cardiology for follow up appointment Return immediately (dial 911) if you experience:  Chest pain, pressure, or squeezing--especially radiating to jaw, neck, arm, back, or stomach Shortness of breath, lightheadedness, nausea, sweating associated with chest pain  Call your provider if you notice:  Confusion, reduced urination, increased swelling in feet/ankles New or worsening bleeding or bruising from medication use   Plan:  The patient has been provided with contact information for the care management team and has been advised to call with any health related questions or concerns.         Patient verbalizes understanding of instructions and care plan provided today and agrees to view in MyChart.  Active MyChart status and patient understanding of how to access instructions and care plan via MyChart confirmed with patient.     The patient has been provided with contact information for the care management team and has been advised to call with any health related questions or concerns.   Please call the care guide team at [phone] if you need to cancel or reschedule your appointment.   Please call the Suicide and Crisis Lifeline: 988 if you are experiencing a Mental Health or Behavioral Health Crisis or need someone to talk to.  Darcie Mellone J. Dayra Rapley RN, MSN [facility], [facility] Health RN Care Manager Direct Dial: [phone]  Fax: [phone] Website: [email or web address]

## 2024-12-03 NOTE — Transitions of Care (Post Inpatient/ED Visit) (Signed)
 " Transition of Care week 2  Visit Note  12/03/2024  Name: Danielle Murray MRN: [id number]          DOB: December 23, 1940  Situation: Patient enrolled in [facility] 30-day program. Visit completed with patient by telephone.   Background:   Initial Transition Care Management Follow-up Telephone Call Discharge Date and Diagnosis: 11/25/24, Non-ST elevated myocardial infarction   Past Medical History:  Diagnosis Date   Arthritis    fingers, all my joints (09/03/2013)   Coronary artery disease    a. 08/2013: unstable angina s/p PTCA/DES to LAD, medical therapy for residual severe stenosis of a mid-distal left PDA branch of large dominant LCx, moderate RCA disease (consider PCI of Murray-PDA if she fails med rx).   DEPRESSION    Diverticulosis    Hyperlipidemia    Hypertension    HYPOTHYROIDISM    Preseptal cellulitis of right upper eyelid 03/10/2016   SVT (supraventricular tachycardia)    a. very brief transient SVT during 08/2013 admission overnight.   Type II diabetes mellitus (HCC)    Dr Tommas    Assessment: Patient Reported Symptoms: Cognitive Cognitive Status: Alert and oriented to person, place, and time, Normal speech and language skills      Neurological Neurological Review of Symptoms: No symptoms reported    HEENT HEENT Symptoms Reported: No symptoms reported      Cardiovascular Cardiovascular Symptoms Reported: Swelling in legs or feet Does patient have uncontrolled Hypertension?: No Cardiovascular Comment: Patient reports some swelling to legs and feet.  She states that PCP advised elevation and compression hose.  CM also reiterated those measures.  Respiratory Respiratory Symptoms Reported: No symptoms reported    Endocrine Endocrine Symptoms Reported: No symptoms reported Is patient diabetic?: Yes Is patient checking blood sugars at home?: Yes List most recent blood sugar readings, include date and time of day: Last sugar 231 after breakfast    Gastrointestinal  Gastrointestinal Symptoms Reported: No symptoms reported      Genitourinary Genitourinary Symptoms Reported: Incontinence Genitourinary Management Strategies: Incontinence garment/pad  Integumentary Integumentary Symptoms Reported: No symptoms reported    Musculoskeletal Musculoskelatal Symptoms Reviewed: Weakness Additional Musculoskeletal Details: Uses walker for ambulatioin        Psychosocial Psychosocial Symptoms Reported: No symptoms reported         There were no vitals filed for this visit. Pain Scale: 0-10 Pain Score: 0-No pain  Medications Reviewed Today     Reviewed by Danielle Poehler, RN (Case Manager) on 12/03/24 at 1010  Med List Status: <None>   Medication Order Taking? Sig Documenting Provider Last Dose Status Informant  acetaminophen  (TYLENOL ) 500 MG tablet [id number] Yes Take 1,000 mg by mouth every 6 (six) hours as needed for mild pain (pain score 1-3) or moderate pain (pain score 4-6). Provider, Historical, Danielle Murray  Active Self, Spouse/Significant Other, Child, Pharmacy Records  apixaban  (ELIQUIS ) 5 MG TABS tablet [id number] Yes Take 1 tablet (5 mg total) by mouth 2 (two) times daily. Danielle Buttery, Danielle Murray  Active   calcium  citrate (CALCITRATE - DOSED IN MG ELEMENTAL CALCIUM ) 950 (200 Ca) MG tablet [id number] Yes Take 200 mg of elemental calcium  by mouth daily. Provider, Historical, Danielle Murray  Active Self, Spouse/Significant Other, Child, Pharmacy Records  carvedilol  (COREG ) 12.5 MG tablet [id number] Yes Take 1 tablet (12.5 mg total) by mouth 2 (two) times daily with a meal. Danielle Buttery, Danielle Murray  Active   clopidogrel  (PLAVIX ) 75 MG tablet [id number] Yes Take 1 tablet (75 mg total) by mouth daily. Murray, Danielle  Murray, Danielle Murray  Active Self, Spouse/Significant Other, Child, Pharmacy Records  furosemide  (LASIX ) 40 MG tablet [id number] Yes Take 1 tablet (40 mg total) by mouth daily. Murray, Danielle Murray, Danielle Murray  Active Self, Spouse/Significant Other, Child, Pharmacy Records  isosorbide  mononitrate (IMDUR ) 30  MG 24 hr tablet [id number] Yes Take 1 tablet (30 mg total) by mouth daily. Murray, Danielle Murray, Danielle Murray  Active Self, Spouse/Significant Other, Child, Pharmacy Records  levothyroxine  (SYNTHROID ) 75 MCG tablet [id number] Yes Take 75 mcg by mouth every morning. Provider, Historical, Danielle Murray  Active Self, Spouse/Significant Other, Child, Pharmacy Records  losartan  (COZAAR ) 25 MG tablet [id number] Yes Take 1 tablet (25 mg total) by mouth daily. Danielle Buttery, Danielle Murray  Active   Menthol, Topical Analgesic, (BIOFREEZE ROLL-ON) 4 % GEL [id number] Yes Apply 1 Application topically as needed (knee pain). Provider, Historical, Danielle Murray  Active Self, Spouse/Significant Other, Child, Pharmacy Records  nitroGLYCERIN  (NITROSTAT ) 0.4 MG SL tablet [id number] Yes Place 1 tablet (0.4 mg total) under the tongue every 5 (five) minutes as needed for chest pain (3 doses max). Murray, Danielle Murray, Danielle Murray  Active Self, Spouse/Significant Other, Child, Pharmacy Records           Med Note Danielle Murray, Danielle Murray   Tue Jul 10, 2022 10:00 AM)    NOVOLIN  70/30 KWIKPEN (70-30) 100 UNIT/ML KwikPen [id number] Yes Inject 20 Units into the skin in the morning and at bedtime. Take 30 units in the morning and Take 50 units at bedtime  Patient taking differently: Inject 20 Units into the skin 3 (three) times daily before meals. Take 30 units in the morning   Danielle Frames, Danielle Murray  Active Self, Spouse/Significant Other, Child, Pharmacy Records           Med Note Danielle Murray, Danielle Murray   Fri Nov 27, 2024 12:51 PM) Patient states she is taking 20 Units in the morning, 10 units at lunch and 20 units at supper.   rosuvastatin  (CRESTOR ) 20 MG tablet [id number] Yes Take 1 tablet (20 mg total) by mouth daily. Danielle Buttery, Danielle Murray  Active             Goals Addressed             This Visit's Progress    VBCI Transitions of Care (TOC) Care Plan       Problems:  Recent Hospitalization for treatment of NSTEMI Knowledge Deficit Related to NSTEMI  Goal:  Over the next 30 days, the patient  will not experience hospital readmission  Interventions:  Transitions of Care: Doctor Visits  - discussed the importance of doctor visits Reiterated diet, advised adequate rest, start with home health 1/23, take medications as prescribed. Reviewed upcoming appointment with cardiology 2/18  Patient Self Care Activities:  Attend all scheduled provider appointments Call pharmacy for medication refills 3-7 days in advance of running out of medications Call provider office for new concerns or questions  Notify RN Care Manager of TOC call rescheduling needs Participate in Transition of Care Program/Attend TOC scheduled calls Take medications as prescribed   Call Cardiology for follow up appointment Return immediately (dial 911) if you experience:  Chest pain, pressure, or squeezing--especially radiating to jaw, neck, arm, back, or stomach Shortness of breath, lightheadedness, nausea, sweating associated with chest pain  Call your provider if you notice:  Confusion, reduced urination, increased swelling in feet/ankles New or worsening bleeding or bruising from medication use   Plan:  The patient has been provided with contact information for the care management team and has  been advised to call with any health related questions or concerns.         Recommendation:   Continue Current Plan of Care  Follow Up Plan:   Telephone follow-up in 1 week  Danielle Murray. Danielle Steinbach RN, MSN [facility], [facility] Health RN Care Manager Direct Dial: [phone]  Fax: [phone] Website: [email or web address]      "

## 2024-12-10 ENCOUNTER — Telehealth: Payer: Self-pay

## 2024-12-10 ENCOUNTER — Other Ambulatory Visit: Payer: Self-pay

## 2024-12-10 NOTE — Patient Instructions (Signed)
 Visit Information  Thank you for taking time to visit with me today. Please don't hesitate to contact me if I can be of assistance to you before our next scheduled telephone appointment.  Our next appointment is by telephone on 12/17/2024 at 1000.  Following is a copy of your care plan:   Goals Addressed             This Visit's Progress    VBCI Transitions of Care (TOC) Care Plan       Problems:  Recent Hospitalization for treatment of NSTEMI Knowledge Deficit Related to NSTEMI  Goal:  Over the next 30 days, the patient will not experience hospital readmission  Interventions:  Transitions of Care: Doctor Visits  - discussed the importance of doctor visits Reiterated diet, advised adequate rest;  patient will continue to work with home health PT (coming later today) and take medications as prescribed. Confirmed patient knows about upcoming appointment with cardiology 2/18  Patient Self Care Activities:  Attend all scheduled provider appointments Call pharmacy for medication refills 3-7 days in advance of running out of medications Call provider office for new concerns or questions  Notify RN Care Manager of TOC call rescheduling needs Participate in Transition of Care Program/Attend TOC scheduled calls Take medications as prescribed   Continue to take Lasix  and elevate legs for Lower extremity edema Eat small meals and snacks throughout the day Return immediately (dial 911) if you experience:  Chest pain, pressure, or squeezing--especially radiating to jaw, neck, arm, back, or stomach Shortness of breath, lightheadedness, nausea, sweating associated with chest pain  Call your provider if you notice:  Confusion, reduced urination, increased swelling in feet/ankles New or worsening bleeding or bruising from medication use   Plan:  The patient has been provided with contact information for the care management team and has been advised to call with any health related questions  or concerns.         Patient verbalizes understanding of instructions and care plan provided today and agrees to view in MyChart. Active MyChart status and patient understanding of how to access instructions and care plan via MyChart confirmed with patient.     The patient has been provided with contact information for the care management team and has been advised to call with any health related questions or concerns.   Please call the care guide team at [phone] if you need to cancel or reschedule your appointment.   Please call the Suicide and Crisis Lifeline: 988 if you are experiencing a Mental Health or Behavioral Health Crisis or need someone to talk to.   Danielle Shams, RN [place] / [facility] RN Care Manager / Transition of Care Direct Dial: [phone]  With  Danielle Leath, RN [place] / [facility] Health RN Care Manager / Transition of Care

## 2024-12-10 NOTE — Transitions of Care (Post Inpatient/ED Visit) (Signed)
 " Transition of Care week 3  Visit Note  12/10/2024  Name: Danielle Murray MRN: [id number]          DOB: 1941/01/06  Situation: Patient enrolled in [facility] 30-day program. Visit completed with patient by telephone.   Background:   Initial Transition Care Management Follow-up Telephone Call Discharge Date and Diagnosis: 11/25/24, Non-ST elevated myocardial infarction   Past Medical History:  Diagnosis Date   Arthritis    fingers, all my joints (09/03/2013)   Coronary artery disease    a. 08/2013: unstable angina s/p PTCA/DES to LAD, medical therapy for residual severe stenosis of a mid-distal left PDA branch of large dominant LCx, moderate RCA disease (consider PCI of L-PDA if she fails med rx).   DEPRESSION    Diverticulosis    Hyperlipidemia    Hypertension    HYPOTHYROIDISM    Preseptal cellulitis of right upper eyelid 03/10/2016   SVT (supraventricular tachycardia)    a. very brief transient SVT during 08/2013 admission overnight.   Type II diabetes mellitus (HCC)    Dr Tommas    Assessment: Patient Reported Symptoms: Cognitive Cognitive Status: Alert and oriented to person, place, and time, Normal speech and language skills      Neurological Neurological Review of Symptoms: Numbness (numbness and tingling in feet in the evenings)    HEENT HEENT Symptoms Reported: No symptoms reported      Cardiovascular Cardiovascular Symptoms Reported: Fatigue, Swelling in legs or feet Cardiovascular Comment: wearing compression stockings; patient will try to elevate feet during the day  Respiratory Respiratory Symptoms Reported: No symptoms reported    Endocrine Endocrine Symptoms Reported: No symptoms reported Is patient diabetic?: Yes Is patient checking blood sugars at home?: Yes List most recent blood sugar readings, include date and time of day: BG 178 right now, per CGM;  has not eaten this morning    Gastrointestinal Gastrointestinal Symptoms Reported: Change in  appetite Additional Gastrointestinal Details: LBM today;  poor appetite this morning, BG readings OK      Genitourinary Genitourinary Symptoms Reported: Frequency Additional Genitourinary Details: Pt reports increased frequency, says she is taking Lasix .    Integumentary Integumentary Symptoms Reported: No symptoms reported    Musculoskeletal Musculoskelatal Symptoms Reviewed: No symptoms reported   Falls in the past year?: No    Psychosocial Psychosocial Symptoms Reported: No symptoms reported         There were no vitals filed for this visit.    Medications Reviewed Today     Reviewed by Elaine Almarie LABOR, RN (Registered Nurse) on 12/10/24 at 1100  Med List Status: <None>   Medication Order Taking? Sig Documenting Provider Last Dose Status Informant  acetaminophen  (TYLENOL ) 500 MG tablet [id number] Yes Take 1,000 mg by mouth every 6 (six) hours as needed for mild pain (pain score 1-3) or moderate pain (pain score 4-6). [provider]  Active Self, Spouse/Significant Other, Child, Pharmacy Records  apixaban  (ELIQUIS ) 5 MG TABS tablet [id number] Yes Take 1 tablet (5 mg total) by mouth 2 (two) times daily. Jillian Buttery, MD  Active   calcium  citrate (CALCITRATE - DOSED IN MG ELEMENTAL CALCIUM ) 950 (200 Ca) MG tablet [id number] Yes Take 200 mg of elemental calcium  by mouth daily. [provider]  Active Self, Spouse/Significant Other, Child, Pharmacy Records  carvedilol  (COREG ) 12.5 MG tablet [id number] Yes Take 1 tablet (12.5 mg total) by mouth 2 (two) times daily with a meal. Adhikari, Amrit, MD  Active   clopidogrel  (PLAVIX ) 75 MG tablet  [id number] Yes Take 1 tablet (75 mg total) by mouth daily. Nishan, Peter C, MD  Active Self, Spouse/Significant Other, Child, Pharmacy Records  furosemide  (LASIX ) 40 MG tablet [id number] Yes Take 1 tablet (40 mg total) by mouth daily. Nishan, Peter C, MD  Active Self, Spouse/Significant Other, Child, Pharmacy Records  isosorbide   mononitrate (IMDUR ) 30 MG 24 hr tablet [id number] Yes Take 1 tablet (30 mg total) by mouth daily. Nishan, Peter C, MD  Active Self, Spouse/Significant Other, Child, Pharmacy Records  levothyroxine  (SYNTHROID ) 75 MCG tablet [id number] Yes Take 75 mcg by mouth every morning. [provider]  Active Self, Spouse/Significant Other, Child, Pharmacy Records  losartan  (COZAAR ) 25 MG tablet [id number] Yes Take 1 tablet (25 mg total) by mouth daily. Jillian Buttery, MD  Active   Menthol, Topical Analgesic, (BIOFREEZE ROLL-ON) 4 % GEL [id number] Yes Apply 1 Application topically as needed (knee pain). [provider]  Active Self, Spouse/Significant Other, Child, Pharmacy Records  nitroGLYCERIN  (NITROSTAT ) 0.4 MG SL tablet [id number] Yes Place 1 tablet (0.4 mg total) under the tongue every 5 (five) minutes as needed for chest pain (3 doses max). Nishan, Peter C, MD  Active Self, Spouse/Significant Other, Child, Pharmacy Records           Med Note EVERLYN, MINDY L   Tue Jul 10, 2022 10:00 AM)    NOVOLIN  70/30 KWIKPEN (70-30) 100 UNIT/ML KwikPen [id number] Yes Inject 20 Units into the skin in the morning and at bedtime. Take 30 units in the morning and Take 50 units at bedtime Fairy Frames, MD  Active Self, Spouse/Significant Other, Child, Pharmacy Records           Med Note ELSTON, ALMARIE A   Thu Dec 10, 2024 10:59 AM) Still taking as noted on 11/27/24  rosuvastatin  (CRESTOR ) 20 MG tablet [id number] Yes Take 1 tablet (20 mg total) by mouth daily. Jillian Buttery, MD  Active             Goals Addressed             This Visit's Progress    VBCI Transitions of Care (TOC) Care Plan       Problems:  Recent Hospitalization for treatment of NSTEMI Knowledge Deficit Related to NSTEMI  Goal:  Over the next 30 days, the patient will not experience hospital readmission  Interventions:  Transitions of Care: Doctor Visits  - discussed the importance of doctor visits Reiterated diet,  advised adequate rest;  patient will continue to work with home health PT (coming later today) and take medications as prescribed. Confirmed patient knows about upcoming appointment with cardiology 2/18  Patient Self Care Activities:  Attend all scheduled provider appointments Call pharmacy for medication refills 3-7 days in advance of running out of medications Call provider office for new concerns or questions  Notify RN Care Manager of TOC call rescheduling needs Participate in Transition of Care Program/Attend TOC scheduled calls Take medications as prescribed   Continue to take Lasix  and elevate legs for Lower extremity edema Eat small meals and snacks throughout the day Return immediately (dial 911) if you experience:  Chest pain, pressure, or squeezing--especially radiating to jaw, neck, arm, back, or stomach Shortness of breath, lightheadedness, nausea, sweating associated with chest pain  Call your provider if you notice:  Confusion, reduced urination, increased swelling in feet/ankles New or worsening bleeding or bruising from medication use   Plan:  The patient has been provided with contact information for the care management team and has  been advised to call with any health related questions or concerns.          Recommendation:   Continue Current Plan of Care  Follow Up Plan:   Telephone follow-up in 1 week  Almarie Shams, RN [place] / [facility] RN Care Manager / Transition of Care Direct Dial: [phone]   With  Dionne Leath, RN [place] / [facility] RN Care Manager / Transition of Care    "

## 2024-12-17 ENCOUNTER — Other Ambulatory Visit: Payer: Self-pay

## 2024-12-17 NOTE — Patient Instructions (Signed)
 Visit Information  Thank you for taking time to visit with me today. Please don't hesitate to contact me if I can be of assistance to you before our next scheduled telephone appointment.  Our next appointment is by telephone on 12/25/24 at 1000 am  Following is a copy of your care plan:   Goals Addressed             This Visit's Progress    VBCI Transitions of Care (TOC) Care Plan       Problems:  Recent Hospitalization for treatment of NSTEMI Knowledge Deficit Related to NSTEMI  Goal:  Over the next 30 days, the patient will not experience hospital readmission  Interventions:  Transitions of Care: Doctor Visits  - discussed the importance of doctor visits Reiterated diet, advised adequate rest;  patient will continue to work with home health PT (coming later today) and take medications as prescribed. Discussed cardiology appointment 2/18  Patient Self Care Activities:  Attend all scheduled provider appointments Call pharmacy for medication refills 3-7 days in advance of running out of medications Call provider office for new concerns or questions  Notify RN Care Manager of TOC call rescheduling needs Participate in Transition of Care Program/Attend TOC scheduled calls Take medications as prescribed   Return immediately (dial 911) if you experience:  Chest pain, pressure, or squeezing--especially radiating to jaw, neck, arm, back, or stomach Shortness of breath, lightheadedness, nausea, sweating associated with chest pain  Call your provider if you notice:  Confusion, reduced urination, increased swelling in feet/ankles New or worsening bleeding or bruising from medication use   Plan:  The patient has been provided with contact information for the care management team and has been advised to call with any health related questions or concerns.         Patient verbalizes understanding of instructions and care plan provided today and agrees to view in MyChart. Active  MyChart status and patient understanding of how to access instructions and care plan via MyChart confirmed with patient.     The patient has been provided with contact information for the care management team and has been advised to call with any health related questions or concerns.   Please call the care guide team at [phone] if you need to cancel or reschedule your appointment.   Please call the Suicide and Crisis Lifeline: 988 if you are experiencing a Mental Health or Behavioral Health Crisis or need someone to talk to.  Carless Slatten J. Joon Pohle RN, MSN [facility], [facility] Health RN Care Manager Direct Dial: [phone]  Fax: [phone] Website: [email or web address]

## 2024-12-17 NOTE — Transitions of Care (Post Inpatient/ED Visit) (Signed)
 " Transition of Care week 4  Visit Note  12/17/2024  Name: Danielle Murray MRN: [id number]          DOB: 07-Sep-1941  Situation: Patient enrolled in [facility] 30-day program. Visit completed with patient by telephone.   Background:   Initial Transition Care Management Follow-up Telephone Call Discharge Date and Diagnosis: 11/25/24, Non-ST elevated myocardial infarction   Past Medical History:  Diagnosis Date   Arthritis    fingers, all my joints (09/03/2013)   Coronary artery disease    a. 08/2013: unstable angina s/p PTCA/DES to LAD, medical therapy for residual severe stenosis of a mid-distal left PDA branch of large dominant LCx, moderate RCA disease (consider PCI of L-PDA if she fails med rx).   DEPRESSION    Diverticulosis    Hyperlipidemia    Hypertension    HYPOTHYROIDISM    Preseptal cellulitis of right upper eyelid 03/10/2016   SVT (supraventricular tachycardia)    a. very brief transient SVT during 08/2013 admission overnight.   Type II diabetes mellitus (HCC)    Dr Tommas    Assessment: Patient Reported Symptoms: Cognitive Cognitive Status: Alert and oriented to person, place, and time, Normal speech and language skills      Neurological Neurological Review of Symptoms: Numbness (numbness mainly at night)    HEENT HEENT Symptoms Reported: No symptoms reported      Cardiovascular Cardiovascular Symptoms Reported: Fatigue, Swelling in legs or feet Cardiovascular Comment: Wears compression hose and elevate feet during the day.  Respiratory Respiratory Symptoms Reported: No symptoms reported    Endocrine Endocrine Symptoms Reported: No symptoms reported Is patient diabetic?: Yes Is patient checking blood sugars at home?: Yes List most recent blood sugar readings, include date and time of day: Blood sugar 268.  Reports eating cereal for breakfast    Gastrointestinal Gastrointestinal Symptoms Reported: No symptoms reported      Genitourinary Genitourinary Symptoms  Reported: Frequency Additional Genitourinary Details: Frequency due to lasix     Integumentary Integumentary Symptoms Reported: No symptoms reported    Musculoskeletal Musculoskelatal Symptoms Reviewed: No symptoms reported        Psychosocial Psychosocial Symptoms Reported: No symptoms reported         There were no vitals filed for this visit.    Medications Reviewed Today     Reviewed by Samir Ishaq, RN (Case Manager) on 12/17/24 at 1021  Med List Status: <None>   Medication Order Taking? Sig Documenting Provider Last Dose Status Informant  acetaminophen  (TYLENOL ) 500 MG tablet [id number] Yes Take 1,000 mg by mouth every 6 (six) hours as needed for mild pain (pain score 1-3) or moderate pain (pain score 4-6). [provider]  Active Self, Spouse/Significant Other, Child, Pharmacy Records  apixaban  (ELIQUIS ) 5 MG TABS tablet [id number] Yes Take 1 tablet (5 mg total) by mouth 2 (two) times daily. Jillian Buttery, MD  Active   calcium  citrate (CALCITRATE - DOSED IN MG ELEMENTAL CALCIUM ) 950 (200 Ca) MG tablet [id number] Yes Take 200 mg of elemental calcium  by mouth daily. [provider]  Active Self, Spouse/Significant Other, Child, Pharmacy Records  carvedilol  (COREG ) 12.5 MG tablet [id number] Yes Take 1 tablet (12.5 mg total) by mouth 2 (two) times daily with a meal. Jillian Buttery, MD  Active   clopidogrel  (PLAVIX ) 75 MG tablet [id number] Yes Take 1 tablet (75 mg total) by mouth daily. Nishan, Peter C, MD  Active Self, Spouse/Significant Other, Child, Pharmacy Records  furosemide  (LASIX ) 40 MG tablet [id number] Yes Take 1  tablet (40 mg total) by mouth daily. Nishan, Peter C, MD  Active Self, Spouse/Significant Other, Child, Pharmacy Records  isosorbide  mononitrate (IMDUR ) 30 MG 24 hr tablet [id number] Yes Take 1 tablet (30 mg total) by mouth daily. Delford Maude BROCKS, MD  Active Self, Spouse/Significant Other, Child, Pharmacy Records  levothyroxine  (SYNTHROID ) 75 MCG tablet  [id number] Yes Take 75 mcg by mouth every morning. [provider]  Active Self, Spouse/Significant Other, Child, Pharmacy Records  losartan  (COZAAR ) 25 MG tablet [id number] Yes Take 1 tablet (25 mg total) by mouth daily. Jillian Buttery, MD  Active   Menthol, Topical Analgesic, (BIOFREEZE ROLL-ON) 4 % GEL [id number] Yes Apply 1 Application topically as needed (knee pain). [provider]  Active Self, Spouse/Significant Other, Child, Pharmacy Records  nitroGLYCERIN  (NITROSTAT ) 0.4 MG SL tablet [id number] Yes Place 1 tablet (0.4 mg total) under the tongue every 5 (five) minutes as needed for chest pain (3 doses max). Nishan, Peter C, MD  Active Self, Spouse/Significant Other, Child, Pharmacy Records           Med Note EVERLYN, MINDY L   Tue Jul 10, 2022 10:00 AM)    NOVOLIN  70/30 KWIKPEN (70-30) 100 UNIT/ML KwikPen [id number] Yes Inject 20 Units into the skin in the morning and at bedtime. Take 30 units in the morning and Take 50 units at bedtime Fairy Frames, MD  Active Self, Spouse/Significant Other, Child, Pharmacy Records           Med Note ELSTON, ALMARIE A   Thu Dec 10, 2024 10:59 AM) Still taking as noted on 11/27/24  rosuvastatin  (CRESTOR ) 20 MG tablet [id number] Yes Take 1 tablet (20 mg total) by mouth daily. Jillian Buttery, MD  Active             Goals Addressed             This Visit's Progress    [facility] Transitions of Care (TOC) Care Plan       Problems:  Recent Hospitalization for treatment of NSTEMI Knowledge Deficit Related to NSTEMI  Goal:  Over the next 30 days, the patient will not experience hospital readmission  Interventions:  Transitions of Care: Doctor Visits  - discussed the importance of doctor visits Reiterated diet, advised adequate rest;  patient will continue to work with home health PT (coming later today) and take medications as prescribed. Discussed cardiology appointment 2/18  Patient Self Care Activities:  Attend all scheduled  provider appointments Call pharmacy for medication refills 3-7 days in advance of running out of medications Call provider office for new concerns or questions  Notify RN Care Manager of TOC call rescheduling needs Participate in Transition of Care Program/Attend TOC scheduled calls Take medications as prescribed   Return immediately (dial 911) if you experience:  Chest pain, pressure, or squeezing--especially radiating to jaw, neck, arm, back, or stomach Shortness of breath, lightheadedness, nausea, sweating associated with chest pain  Call your provider if you notice:  Confusion, reduced urination, increased swelling in feet/ankles New or worsening bleeding or bruising from medication use   Plan:  The patient has been provided with contact information for the care management team and has been advised to call with any health related questions or concerns.         Recommendation:   Continue Current Plan of Care  Follow Up Plan:   Telephone follow-up in 1 week  Danielle Murray J. Ashtan Laton RN, MSN [place]  [facility] RN Care Manager Direct Dial:  [phone]  Fax: [phone] Website: [email or web address]      "

## 2024-12-25 ENCOUNTER — Telehealth

## 2024-12-30 ENCOUNTER — Ambulatory Visit: Admitting: Emergency Medicine

## 2025-01-06 ENCOUNTER — Ambulatory Visit: Admitting: Emergency Medicine
